# Patient Record
Sex: Female | Born: 1935 | Race: White | Hispanic: No | Marital: Married | Smoking: Never smoker
Health system: Southern US, Community
[De-identification: ages and names within clinical notes are randomized; demographics above are authoritative.]

## PROBLEM LIST (undated history)

## (undated) DIAGNOSIS — Z9289 Personal history of other medical treatment: Secondary | ICD-10-CM

## (undated) DIAGNOSIS — I5032 Chronic diastolic (congestive) heart failure: Secondary | ICD-10-CM

## (undated) DIAGNOSIS — S72002A Fracture of unspecified part of neck of left femur, initial encounter for closed fracture: Secondary | ICD-10-CM

## (undated) DIAGNOSIS — M069 Rheumatoid arthritis, unspecified: Secondary | ICD-10-CM

## (undated) DIAGNOSIS — R0600 Dyspnea, unspecified: Secondary | ICD-10-CM

## (undated) DIAGNOSIS — R0609 Other forms of dyspnea: Secondary | ICD-10-CM

## (undated) DIAGNOSIS — I480 Paroxysmal atrial fibrillation: Secondary | ICD-10-CM

## (undated) DIAGNOSIS — I82409 Acute embolism and thrombosis of unspecified deep veins of unspecified lower extremity: Secondary | ICD-10-CM

## (undated) DIAGNOSIS — E785 Hyperlipidemia, unspecified: Secondary | ICD-10-CM

## (undated) DIAGNOSIS — M1990 Unspecified osteoarthritis, unspecified site: Secondary | ICD-10-CM

## (undated) DIAGNOSIS — I10 Essential (primary) hypertension: Secondary | ICD-10-CM

## (undated) HISTORY — PX: RECTOCELE REPAIR: SHX761

## (undated) HISTORY — DX: Hyperlipidemia, unspecified: E78.5

## (undated) HISTORY — DX: Personal history of other medical treatment: Z92.89

## (undated) HISTORY — DX: Essential (primary) hypertension: I10

## (undated) HISTORY — DX: Other forms of dyspnea: R06.09

## (undated) HISTORY — PX: BLADDER SUSPENSION: SHX72

## (undated) HISTORY — DX: Paroxysmal atrial fibrillation: I48.0

## (undated) HISTORY — DX: Dyspnea, unspecified: R06.00

## (undated) HISTORY — PX: TONSILLECTOMY: SUR1361

---

## 1968-07-08 DIAGNOSIS — M069 Rheumatoid arthritis, unspecified: Secondary | ICD-10-CM

## 1968-07-08 HISTORY — DX: Rheumatoid arthritis, unspecified: M06.9

## 1982-07-08 HISTORY — PX: BREAST BIOPSY: SHX20

## 1994-07-08 HISTORY — PX: ABDOMINAL HYSTERECTOMY: SHX81

## 2001-07-08 HISTORY — PX: CATARACT EXTRACTION W/ INTRAOCULAR LENS  IMPLANT, BILATERAL: SHX1307

## 2001-11-25 ENCOUNTER — Encounter: Admission: RE | Admit: 2001-11-25 | Discharge: 2001-11-25 | Payer: Self-pay | Admitting: Family Medicine

## 2001-12-02 ENCOUNTER — Encounter: Payer: Self-pay | Admitting: Family Medicine

## 2001-12-02 ENCOUNTER — Encounter (INDEPENDENT_AMBULATORY_CARE_PROVIDER_SITE_OTHER): Payer: Self-pay | Admitting: *Deleted

## 2001-12-02 ENCOUNTER — Encounter: Admission: RE | Admit: 2001-12-02 | Discharge: 2001-12-02 | Payer: Self-pay | Admitting: *Deleted

## 2002-05-25 ENCOUNTER — Encounter: Admission: RE | Admit: 2002-05-25 | Discharge: 2002-05-25 | Payer: Self-pay | Admitting: Family Medicine

## 2002-05-25 ENCOUNTER — Encounter: Payer: Self-pay | Admitting: Family Medicine

## 2009-10-06 ENCOUNTER — Inpatient Hospital Stay (HOSPITAL_COMMUNITY): Admission: EM | Admit: 2009-10-06 | Discharge: 2009-10-13 | Payer: Self-pay | Admitting: Emergency Medicine

## 2009-10-06 DIAGNOSIS — Z9289 Personal history of other medical treatment: Secondary | ICD-10-CM

## 2009-10-06 HISTORY — DX: Personal history of other medical treatment: Z92.89

## 2010-09-26 LAB — BASIC METABOLIC PANEL WITH GFR
BUN: 16 mg/dL (ref 6–23)
BUN: 16 mg/dL (ref 6–23)
BUN: 18 mg/dL (ref 6–23)
BUN: 19 mg/dL (ref 6–23)
BUN: 22 mg/dL (ref 6–23)
CO2: 27 meq/L (ref 19–32)
CO2: 30 meq/L (ref 19–32)
CO2: 30 meq/L (ref 19–32)
CO2: 30 meq/L (ref 19–32)
CO2: 32 meq/L (ref 19–32)
Calcium: 8.5 mg/dL (ref 8.4–10.5)
Calcium: 8.5 mg/dL (ref 8.4–10.5)
Calcium: 8.6 mg/dL (ref 8.4–10.5)
Calcium: 9 mg/dL (ref 8.4–10.5)
Calcium: 9.3 mg/dL (ref 8.4–10.5)
Chloride: 106 meq/L (ref 96–112)
Chloride: 94 meq/L — ABNORMAL LOW (ref 96–112)
Chloride: 96 meq/L (ref 96–112)
Chloride: 99 meq/L (ref 96–112)
Chloride: 99 meq/L (ref 96–112)
Creatinine, Ser: 0.92 mg/dL (ref 0.4–1.2)
Creatinine, Ser: 0.95 mg/dL (ref 0.4–1.2)
Creatinine, Ser: 0.97 mg/dL (ref 0.4–1.2)
Creatinine, Ser: 1.07 mg/dL (ref 0.4–1.2)
Creatinine, Ser: 1.21 mg/dL — ABNORMAL HIGH (ref 0.4–1.2)
GFR calc non Af Amer: 42 mL/min — ABNORMAL LOW
GFR calc non Af Amer: 49 mL/min — ABNORMAL LOW
GFR calc non Af Amer: 55 mL/min — ABNORMAL LOW
GFR calc non Af Amer: 56 mL/min — ABNORMAL LOW
GFR calc non Af Amer: 58 mL/min — ABNORMAL LOW
Glucose, Bld: 102 mg/dL — ABNORMAL HIGH (ref 70–99)
Glucose, Bld: 106 mg/dL — ABNORMAL HIGH (ref 70–99)
Glucose, Bld: 111 mg/dL — ABNORMAL HIGH (ref 70–99)
Glucose, Bld: 112 mg/dL — ABNORMAL HIGH (ref 70–99)
Glucose, Bld: 128 mg/dL — ABNORMAL HIGH (ref 70–99)
Potassium: 3.3 meq/L — ABNORMAL LOW (ref 3.5–5.1)
Potassium: 3.4 meq/L — ABNORMAL LOW (ref 3.5–5.1)
Potassium: 3.9 meq/L (ref 3.5–5.1)
Potassium: 4 meq/L (ref 3.5–5.1)
Potassium: 5.2 meq/L — ABNORMAL HIGH (ref 3.5–5.1)
Sodium: 134 meq/L — ABNORMAL LOW (ref 135–145)
Sodium: 134 meq/L — ABNORMAL LOW (ref 135–145)
Sodium: 135 meq/L (ref 135–145)
Sodium: 137 meq/L (ref 135–145)
Sodium: 139 meq/L (ref 135–145)

## 2010-09-26 LAB — COMPREHENSIVE METABOLIC PANEL WITH GFR
ALT: 29 U/L (ref 0–35)
AST: 21 U/L (ref 0–37)
Albumin: 3.2 g/dL — ABNORMAL LOW (ref 3.5–5.2)
Alkaline Phosphatase: 73 U/L (ref 39–117)
BUN: 21 mg/dL (ref 6–23)
CO2: 30 meq/L (ref 19–32)
Calcium: 8.6 mg/dL (ref 8.4–10.5)
Chloride: 94 meq/L — ABNORMAL LOW (ref 96–112)
Creatinine, Ser: 1.04 mg/dL (ref 0.4–1.2)
GFR calc non Af Amer: 50 mL/min — ABNORMAL LOW
Glucose, Bld: 108 mg/dL — ABNORMAL HIGH (ref 70–99)
Potassium: 3.9 meq/L (ref 3.5–5.1)
Sodium: 133 meq/L — ABNORMAL LOW (ref 135–145)
Total Bilirubin: 0.6 mg/dL (ref 0.3–1.2)
Total Protein: 6.9 g/dL (ref 6.0–8.3)

## 2010-09-26 LAB — BRAIN NATRIURETIC PEPTIDE
Pro B Natriuretic peptide (BNP): 310 pg/mL — ABNORMAL HIGH (ref 0.0–100.0)
Pro B Natriuretic peptide (BNP): 493 pg/mL — ABNORMAL HIGH (ref 0.0–100.0)
Pro B Natriuretic peptide (BNP): 816 pg/mL — ABNORMAL HIGH (ref 0.0–100.0)

## 2010-09-26 LAB — CBC
HCT: 33 % — ABNORMAL LOW (ref 36.0–46.0)
HCT: 35.2 % — ABNORMAL LOW (ref 36.0–46.0)
HCT: 36.2 % (ref 36.0–46.0)
HCT: 37.6 % (ref 36.0–46.0)
HCT: 38.3 % (ref 36.0–46.0)
HCT: 38.5 % (ref 36.0–46.0)
HCT: 38.6 % (ref 36.0–46.0)
Hemoglobin: 11.2 g/dL — ABNORMAL LOW (ref 12.0–15.0)
Hemoglobin: 11.9 g/dL — ABNORMAL LOW (ref 12.0–15.0)
Hemoglobin: 12 g/dL (ref 12.0–15.0)
Hemoglobin: 12.6 g/dL (ref 12.0–15.0)
Hemoglobin: 12.7 g/dL (ref 12.0–15.0)
Hemoglobin: 12.9 g/dL (ref 12.0–15.0)
Hemoglobin: 13.2 g/dL (ref 12.0–15.0)
MCHC: 32.9 g/dL (ref 30.0–36.0)
MCHC: 33.2 g/dL (ref 30.0–36.0)
MCHC: 33.3 g/dL (ref 30.0–36.0)
MCHC: 33.5 g/dL (ref 30.0–36.0)
MCHC: 33.8 g/dL (ref 30.0–36.0)
MCHC: 33.8 g/dL (ref 30.0–36.0)
MCHC: 33.9 g/dL (ref 30.0–36.0)
MCHC: 34.2 g/dL (ref 30.0–36.0)
MCV: 94.5 fL (ref 78.0–100.0)
MCV: 94.9 fL (ref 78.0–100.0)
MCV: 95 fL (ref 78.0–100.0)
MCV: 95.1 fL (ref 78.0–100.0)
MCV: 95.7 fL (ref 78.0–100.0)
MCV: 95.9 fL (ref 78.0–100.0)
MCV: 95.9 fL (ref 78.0–100.0)
MCV: 96 fL (ref 78.0–100.0)
Platelets: 189 K/uL (ref 150–400)
Platelets: 218 K/uL (ref 150–400)
Platelets: 221 K/uL (ref 150–400)
Platelets: 227 K/uL (ref 150–400)
Platelets: 234 K/uL (ref 150–400)
Platelets: 246 10*3/uL (ref 150–400)
Platelets: 250 K/uL (ref 150–400)
Platelets: 251 K/uL (ref 150–400)
RBC: 3.44 MIL/uL — ABNORMAL LOW (ref 3.87–5.11)
RBC: 3.71 MIL/uL — ABNORMAL LOW (ref 3.87–5.11)
RBC: 3.81 MIL/uL — ABNORMAL LOW (ref 3.87–5.11)
RBC: 3.82 MIL/uL — ABNORMAL LOW (ref 3.87–5.11)
RBC: 3.98 MIL/uL (ref 3.87–5.11)
RBC: 3.99 MIL/uL (ref 3.87–5.11)
RBC: 4.01 MIL/uL (ref 3.87–5.11)
RBC: 4.03 MIL/uL (ref 3.87–5.11)
RDW: 14.4 % (ref 11.5–15.5)
RDW: 14.6 % (ref 11.5–15.5)
RDW: 14.8 % (ref 11.5–15.5)
RDW: 14.8 % (ref 11.5–15.5)
RDW: 14.9 % (ref 11.5–15.5)
RDW: 15.1 % (ref 11.5–15.5)
RDW: 15.3 % (ref 11.5–15.5)
WBC: 10.8 K/uL — ABNORMAL HIGH (ref 4.0–10.5)
WBC: 11.3 K/uL — ABNORMAL HIGH (ref 4.0–10.5)
WBC: 14.2 K/uL — ABNORMAL HIGH (ref 4.0–10.5)
WBC: 14.5 K/uL — ABNORMAL HIGH (ref 4.0–10.5)
WBC: 7.1 K/uL (ref 4.0–10.5)
WBC: 7.3 K/uL (ref 4.0–10.5)
WBC: 7.7 K/uL (ref 4.0–10.5)
WBC: 8.2 10*3/uL (ref 4.0–10.5)

## 2010-09-26 LAB — B. BURGDORFI ANTIBODIES: B burgdorferi Ab IgG+IgM: 0.12 {ISR}

## 2010-09-26 LAB — CARDIAC PANEL(CRET KIN+CKTOT+MB+TROPI)
CK, MB: 10 ng/mL (ref 0.3–4.0)
Total CK: 225 U/L — ABNORMAL HIGH (ref 7–177)
Total CK: 241 U/L — ABNORMAL HIGH (ref 7–177)

## 2010-09-26 LAB — PROTIME-INR
INR: 1.06 (ref 0.00–1.49)
INR: 1.39 (ref 0.00–1.49)
INR: 1.69 — ABNORMAL HIGH (ref 0.00–1.49)
INR: 2.02 — ABNORMAL HIGH (ref 0.00–1.49)
INR: 2.16 — ABNORMAL HIGH (ref 0.00–1.49)
Prothrombin Time: 13.7 s (ref 11.6–15.2)
Prothrombin Time: 16.9 s — ABNORMAL HIGH (ref 11.6–15.2)
Prothrombin Time: 19.7 s — ABNORMAL HIGH (ref 11.6–15.2)
Prothrombin Time: 22.7 s — ABNORMAL HIGH (ref 11.6–15.2)
Prothrombin Time: 23.9 seconds — ABNORMAL HIGH (ref 11.6–15.2)

## 2010-09-26 LAB — MAGNESIUM
Magnesium: 1.9 mg/dL (ref 1.5–2.5)
Magnesium: 2.5 mg/dL (ref 1.5–2.5)

## 2010-09-26 LAB — DIFFERENTIAL
Basophils Absolute: 0 K/uL (ref 0.0–0.1)
Basophils Absolute: 0 K/uL (ref 0.0–0.1)
Basophils Relative: 0 % (ref 0–1)
Basophils Relative: 0 % (ref 0–1)
Eosinophils Absolute: 0 K/uL (ref 0.0–0.7)
Eosinophils Absolute: 0.1 K/uL (ref 0.0–0.7)
Eosinophils Relative: 1 % (ref 0–5)
Eosinophils Relative: 1 % (ref 0–5)
Lymphocytes Relative: 14 % (ref 12–46)
Lymphocytes Relative: 8 % — ABNORMAL LOW (ref 12–46)
Lymphs Abs: 1 K/uL (ref 0.7–4.0)
Lymphs Abs: 1.1 K/uL (ref 0.7–4.0)
Monocytes Absolute: 0.6 K/uL (ref 0.1–1.0)
Monocytes Absolute: 1.4 K/uL — ABNORMAL HIGH (ref 0.1–1.0)
Monocytes Relative: 10 % (ref 3–12)
Monocytes Relative: 9 % (ref 3–12)
Neutro Abs: 11.6 K/uL — ABNORMAL HIGH (ref 1.7–7.7)
Neutro Abs: 5.4 K/uL (ref 1.7–7.7)
Neutrophils Relative %: 77 % (ref 43–77)
Neutrophils Relative %: 82 % — ABNORMAL HIGH (ref 43–77)

## 2010-09-26 LAB — URINALYSIS, ROUTINE W REFLEX MICROSCOPIC
Bilirubin Urine: NEGATIVE
Glucose, UA: NEGATIVE mg/dL
Ketones, ur: NEGATIVE mg/dL
Nitrite: NEGATIVE
Protein, ur: 30 mg/dL — AB
Specific Gravity, Urine: 1.043 — ABNORMAL HIGH (ref 1.005–1.030)
Urobilinogen, UA: 1 mg/dL (ref 0.0–1.0)
pH: 6 (ref 5.0–8.0)

## 2010-09-26 LAB — CK TOTAL AND CKMB (NOT AT ARMC)
CK, MB: 12.8 ng/mL (ref 0.3–4.0)
Relative Index: 4.5 — ABNORMAL HIGH (ref 0.0–2.5)
Total CK: 287 U/L — ABNORMAL HIGH (ref 7–177)

## 2010-09-26 LAB — URINE CULTURE
Colony Count: >=100000
Special Requests: NEGATIVE

## 2010-09-26 LAB — URINE MICROSCOPIC-ADD ON

## 2010-09-26 LAB — BASIC METABOLIC PANEL
BUN: 15 mg/dL (ref 6–23)
Calcium: 8.3 mg/dL — ABNORMAL LOW (ref 8.4–10.5)
Creatinine, Ser: 1.12 mg/dL (ref 0.4–1.2)
GFR calc non Af Amer: 46 mL/min — ABNORMAL LOW (ref >60)
Glucose, Bld: 109 mg/dL — ABNORMAL HIGH (ref 70–99)

## 2010-09-26 LAB — ROCKY MTN SPOTTED FVR AB, IGG-BLOOD: RMSF IgG: 0.33 IV

## 2010-09-26 LAB — COMPREHENSIVE METABOLIC PANEL
BUN: 15 mg/dL (ref 6–23)
CO2: 26 mEq/L (ref 19–32)
Calcium: 9.3 mg/dL (ref 8.4–10.5)
Creatinine, Ser: 0.97 mg/dL (ref 0.4–1.2)
GFR calc non Af Amer: 55 mL/min — ABNORMAL LOW (ref >60)
Glucose, Bld: 143 mg/dL — ABNORMAL HIGH (ref 70–99)
Total Protein: 7.3 g/dL (ref 6.0–8.3)

## 2010-09-26 LAB — TROPONIN I: Troponin I: 0.02 ng/mL (ref 0.00–0.06)

## 2012-07-10 ENCOUNTER — Other Ambulatory Visit (HOSPITAL_COMMUNITY): Payer: Self-pay | Admitting: Internal Medicine

## 2012-07-10 DIAGNOSIS — R0602 Shortness of breath: Secondary | ICD-10-CM

## 2012-07-22 ENCOUNTER — Ambulatory Visit (HOSPITAL_COMMUNITY)
Admission: RE | Admit: 2012-07-22 | Discharge: 2012-07-22 | Disposition: A | Discharge status: Home / Self Care | Payer: Medicare Other | Source: Ambulatory Visit | Attending: Internal Medicine | Admitting: Internal Medicine

## 2012-07-22 DIAGNOSIS — R0989 Other specified symptoms and signs involving the circulatory and respiratory systems: Secondary | ICD-10-CM | POA: Insufficient documentation

## 2012-07-22 DIAGNOSIS — I10 Essential (primary) hypertension: Secondary | ICD-10-CM | POA: Insufficient documentation

## 2012-07-22 DIAGNOSIS — I369 Nonrheumatic tricuspid valve disorder, unspecified: Secondary | ICD-10-CM | POA: Insufficient documentation

## 2012-07-22 DIAGNOSIS — I080 Rheumatic disorders of both mitral and aortic valves: Secondary | ICD-10-CM | POA: Insufficient documentation

## 2012-07-22 DIAGNOSIS — R0609 Other forms of dyspnea: Secondary | ICD-10-CM | POA: Insufficient documentation

## 2012-07-22 DIAGNOSIS — I379 Nonrheumatic pulmonary valve disorder, unspecified: Secondary | ICD-10-CM | POA: Insufficient documentation

## 2012-07-22 DIAGNOSIS — R0602 Shortness of breath: Secondary | ICD-10-CM

## 2012-07-22 DIAGNOSIS — I4891 Unspecified atrial fibrillation: Secondary | ICD-10-CM | POA: Insufficient documentation

## 2012-07-22 HISTORY — PX: TRANSTHORACIC ECHOCARDIOGRAM: SHX275

## 2012-07-22 NOTE — Progress Notes (Signed)
2D Echo Performed 07/22/2012    Lief Palmatier, RCS  

## 2013-02-09 ENCOUNTER — Encounter: Payer: Self-pay | Admitting: *Deleted

## 2013-02-12 ENCOUNTER — Ambulatory Visit (INDEPENDENT_AMBULATORY_CARE_PROVIDER_SITE_OTHER): Payer: Medicare Other | Admitting: Internal Medicine

## 2013-02-12 ENCOUNTER — Encounter: Payer: Self-pay | Admitting: Internal Medicine

## 2013-02-12 VITALS — BP 120/72 | HR 53 | Ht 65.0 in | Wt 166.8 lb

## 2013-02-12 DIAGNOSIS — E785 Hyperlipidemia, unspecified: Secondary | ICD-10-CM

## 2013-02-12 DIAGNOSIS — I4819 Other persistent atrial fibrillation: Secondary | ICD-10-CM | POA: Insufficient documentation

## 2013-02-12 DIAGNOSIS — I4891 Unspecified atrial fibrillation: Secondary | ICD-10-CM

## 2013-02-12 DIAGNOSIS — R0602 Shortness of breath: Secondary | ICD-10-CM | POA: Insufficient documentation

## 2013-02-12 DIAGNOSIS — R0989 Other specified symptoms and signs involving the circulatory and respiratory systems: Secondary | ICD-10-CM

## 2013-02-12 DIAGNOSIS — I480 Paroxysmal atrial fibrillation: Secondary | ICD-10-CM

## 2013-02-12 DIAGNOSIS — I519 Heart disease, unspecified: Secondary | ICD-10-CM

## 2013-02-12 DIAGNOSIS — I10 Essential (primary) hypertension: Secondary | ICD-10-CM | POA: Insufficient documentation

## 2013-02-12 DIAGNOSIS — I5189 Other ill-defined heart diseases: Secondary | ICD-10-CM

## 2013-02-12 NOTE — Progress Notes (Signed)
OFFICE NOTE  Chief Complaint:  Routine followup  Primary Care Physician: Carmin Richmond, MD  HPI:  Kendra Hughes is an 79 year old female with a history of PAF which is infrequent. She has been on aspirin per her choice, although she met criteria of course for Coumadin therapy. She is sinus rhythm today. She has been having some shortness of breath with exertion. This is improved somewhat with Lasix, and then she was put on Aldactone. As you saw, the potassium unfortunately got very high and this is now resolved by your blood work on July 28, 2012. She does seem to do well with Lasix and has never had any lower extremity edema, most of it is across her abdomen which sounds like she may have some perhaps steatohepatitis or non-alcoholic fatty liver. There is an elevated LDH with normal liver enzymes and normal GGT, however. There is no evidence of heart failure. In fact we did an echocardiogram which we followed up with her today which shows an EF of 55-60% with apparently normal diastolic function. Her wall thickness is normal. There is mild AI and mild MR, however, and mild to moderate TR, but her left atrial size is normal, and again diastolic function appears normal - but that is hard to believe given her age. Her only other concern today is that she has some episodes of low blood pressure. At times her blood pressures dropped into the 90s systolic with an associated increase in her pulse. She thinks she is on possibly too much blood pressure medication.  PMHx:  Past Medical History  Diagnosis Date  . PAF (paroxysmal atrial fibrillation)   . Hypertension   . Dyslipidemia   . DOE (dyspnea on exertion)   . History of nuclear stress test 10/2009    normal exam    Past Surgical History  Procedure Laterality Date  . Abdominal hysterectomy    . Biopsy breast      right  . Transthoracic echocardiogram  07/22/2012    EF 55-60%; mild AV regurg; mild MR; mild-mod TR    FAMHx:  No family  history on file.  SOCHx:   reports that she has never smoked. She has never used smokeless tobacco. She reports that she does not drink alcohol or use illicit drugs.  ALLERGIES:  No Known Allergies  ROS: A comprehensive review of systems was negative except for: Cardiovascular: positive for dyspnea Neurological: positive for dizziness  HOME MEDS: Current Outpatient Prescriptions  Medication Sig Dispense Refill  . aspirin 325 MG tablet Take 325 mg by mouth daily.      [name] atorvastatin (LIPITOR) 10 MG tablet Take 10 mg by mouth daily.      . carvedilol (COREG) 3.125 MG tablet Take 3.125 mg by mouth 2 (two) times daily with a meal.      . Cholecalciferol (VITAMIN D PO) Take 1,000 mg by mouth daily.       [name] Fexofenadine HCl (ALLEGRA PO) Take by mouth as needed.      . Fluticasone Propionate (FLONASE NA) Place into the nose as needed.      . folic acid (FOLVITE) 800 MCG tablet Take 800 mcg by mouth daily.      . furosemide (LASIX) 40 MG tablet Take 40 mg by mouth daily.       No current facility-administered medications for this visit.    LABS/IMAGING: No results found for this or any previous visit (from the past 48 hour(s)). No results found.  VITALS: BP 120/72  Pulse 53  Ht 5\' 5"  (1.651 m)  Wt 166 lb 12.8 oz (75.66 kg)  BMI 27.76 kg/m2  EXAM: General appearance: alert and no distress Neck: no adenopathy, no carotid bruit, no JVD, supple, symmetrical, trachea midline and thyroid not enlarged, symmetric, no tenderness/mass/nodules Lungs: clear to auscultation bilaterally Heart: regular rate and rhythm, S1, S2 normal, no murmur, click, rub or gallop Abdomen: soft, non-tender; bowel sounds normal; no masses,  no organomegaly Extremities: extremities normal, atraumatic, no cyanosis or edema Pulses: 2+ and symmetric Skin: Skin color, texture, turgor normal. No rashes or lesions Neurologic: Grossly normal  EKG: Sinus bradycardia at 53  ASSESSMENT: 1. Paroxysmal atrial  fibrillation, remote on aspirin (she has declined warfarin) 2. Hypertension 3. Dyslipidemia 4. Mild dyspnea on exertion which is stable  PLAN: 1.   [name] continues to do very well. She is active gardening and continues to tan vegetables. She does have some shortness of breath with marked exertion but is generally able to do most activities without limitation. She has had a couple of episodes of hypotension in the morning and it may be related to her blood pressure medications. I think her blood pressures been very well controlled and the additional low-dose [place] that she takes is probably not necessary. We're going to discontinue that today. She should continue her other medications. We'll plan to see her back annually or sooner as necessary.  Chrystie Nose, MD, [facility] Attending Cardiologist [facility]  Shawndell Varas C 02/12/2013, 11:29 AM

## 2013-02-12 NOTE — Patient Instructions (Signed)
Your physician recommends that you schedule a follow-up appointment in: 1 year  Stop taking ramipril.

## 2013-03-11 ENCOUNTER — Telehealth: Payer: Self-pay | Admitting: Internal Medicine

## 2013-03-11 MED ORDER — CARVEDILOL 3.125 MG PO TABS: 3.1250 mg | ORAL_TABLET | Freq: Two times a day (BID) | ORAL | Status: DC

## 2013-03-11 NOTE — Telephone Encounter (Signed)
Returned call.  Pt stated she called [facility] for refills on Coreg.  Stated they tried to contact her doctor, but it was her old doctor.  Needs Rx sent to [facility].  Confirmed dose and Refill(s) sent to pharmacy.  Pt verbalized understanding and agreed w/ plan.

## 2013-03-11 NOTE — Telephone Encounter (Signed)
Pt was calling in regards her Rx Coreg. She stated that it was running out but needed to talk to someone about it.

## 2013-08-19 ENCOUNTER — Ambulatory Visit (INDEPENDENT_AMBULATORY_CARE_PROVIDER_SITE_OTHER): Payer: Medicare Other | Admitting: Internal Medicine

## 2013-08-19 ENCOUNTER — Encounter (HOSPITAL_COMMUNITY): Payer: Self-pay | Admitting: Emergency Medicine

## 2013-08-19 ENCOUNTER — Emergency Department (HOSPITAL_COMMUNITY): Payer: Medicare Other

## 2013-08-19 ENCOUNTER — Encounter: Payer: Self-pay | Admitting: Internal Medicine

## 2013-08-19 ENCOUNTER — Encounter (HOSPITAL_COMMUNITY): Payer: Self-pay | Admitting: Cardiology

## 2013-08-19 ENCOUNTER — Inpatient Hospital Stay (HOSPITAL_COMMUNITY)
Admission: EM | Admit: 2013-08-19 | Discharge: 2013-08-27 | DRG: 291 | Disposition: A | Discharge status: Home / Self Care | Payer: Medicare Other | Attending: Internal Medicine | Admitting: Internal Medicine

## 2013-08-19 VITALS — BP 102/62 | HR 173 | Ht 64.0 in | Wt 175.0 lb

## 2013-08-19 DIAGNOSIS — E785 Hyperlipidemia, unspecified: Secondary | ICD-10-CM | POA: Diagnosis present

## 2013-08-19 DIAGNOSIS — E876 Hypokalemia: Secondary | ICD-10-CM | POA: Diagnosis present

## 2013-08-19 DIAGNOSIS — I079 Rheumatic tricuspid valve disease, unspecified: Secondary | ICD-10-CM | POA: Diagnosis present

## 2013-08-19 DIAGNOSIS — I059 Rheumatic mitral valve disease, unspecified: Secondary | ICD-10-CM | POA: Diagnosis present

## 2013-08-19 DIAGNOSIS — I509 Heart failure, unspecified: Secondary | ICD-10-CM

## 2013-08-19 DIAGNOSIS — I519 Heart disease, unspecified: Secondary | ICD-10-CM

## 2013-08-19 DIAGNOSIS — I959 Hypotension, unspecified: Secondary | ICD-10-CM | POA: Diagnosis present

## 2013-08-19 DIAGNOSIS — Z7901 Long term (current) use of anticoagulants: Secondary | ICD-10-CM

## 2013-08-19 DIAGNOSIS — I10 Essential (primary) hypertension: Secondary | ICD-10-CM | POA: Diagnosis present

## 2013-08-19 DIAGNOSIS — R0609 Other forms of dyspnea: Secondary | ICD-10-CM

## 2013-08-19 DIAGNOSIS — R570 Cardiogenic shock: Secondary | ICD-10-CM

## 2013-08-19 DIAGNOSIS — I4891 Unspecified atrial fibrillation: Secondary | ICD-10-CM

## 2013-08-19 DIAGNOSIS — I4819 Other persistent atrial fibrillation: Secondary | ICD-10-CM | POA: Diagnosis present

## 2013-08-19 DIAGNOSIS — R0989 Other specified symptoms and signs involving the circulatory and respiratory systems: Secondary | ICD-10-CM

## 2013-08-19 DIAGNOSIS — I513 Intracardiac thrombosis, not elsewhere classified: Secondary | ICD-10-CM

## 2013-08-19 DIAGNOSIS — Z7982 Long term (current) use of aspirin: Secondary | ICD-10-CM

## 2013-08-19 DIAGNOSIS — R791 Abnormal coagulation profile: Secondary | ICD-10-CM | POA: Diagnosis present

## 2013-08-19 DIAGNOSIS — I5189 Other ill-defined heart diseases: Secondary | ICD-10-CM

## 2013-08-19 DIAGNOSIS — I38 Endocarditis, valve unspecified: Secondary | ICD-10-CM | POA: Diagnosis present

## 2013-08-19 DIAGNOSIS — I5031 Acute diastolic (congestive) heart failure: Principal | ICD-10-CM | POA: Diagnosis present

## 2013-08-19 LAB — TROPONIN I

## 2013-08-19 LAB — CBC WITH DIFFERENTIAL/PLATELET
BASOS ABS: 0 10*3/uL (ref 0.0–0.1)
Basophils Relative: 0 % (ref 0–1)
EOS PCT: 1 % (ref 0–5)
Eosinophils Absolute: 0.1 10*3/uL (ref 0.0–0.7)
HEMATOCRIT: 38.5 % (ref 36.0–46.0)
HEMOGLOBIN: 13.3 g/dL (ref 12.0–15.0)
Lymphocytes Relative: 18 % (ref 12–46)
Lymphs Abs: 1.1 10*3/uL (ref 0.7–4.0)
MCH: 30.7 pg (ref 26.0–34.0)
MCHC: 34.5 g/dL (ref 30.0–36.0)
MCV: 88.9 fL (ref 78.0–100.0)
MONO ABS: 0.7 10*3/uL (ref 0.1–1.0)
MONOS PCT: 11 % (ref 3–12)
Neutro Abs: 4.1 10*3/uL (ref 1.7–7.7)
Neutrophils Relative %: 70 % (ref 43–77)
Platelets: 273 10*3/uL (ref 150–400)
RBC: 4.33 MIL/uL (ref 3.87–5.11)
RDW: 14.4 % (ref 11.5–15.5)
WBC: 5.9 10*3/uL (ref 4.0–10.5)

## 2013-08-19 LAB — PRO B NATRIURETIC PEPTIDE
PRO B NATRI PEPTIDE: 2480 pg/mL — AB (ref 0–450)
Pro B Natriuretic peptide (BNP): 2773 pg/mL — ABNORMAL HIGH (ref 0–450)

## 2013-08-19 LAB — BASIC METABOLIC PANEL
BUN: 18 mg/dL (ref 6–23)
CALCIUM: 9.4 mg/dL (ref 8.4–10.5)
CO2: 27 meq/L (ref 19–32)
CREATININE: 0.85 mg/dL (ref 0.50–1.10)
Chloride: 92 mEq/L — ABNORMAL LOW (ref 96–112)
GFR calc Af Amer: 69 mL/min — ABNORMAL LOW (ref >90)
GFR calc non Af Amer: 59 mL/min — ABNORMAL LOW (ref >90)
GLUCOSE: 108 mg/dL — AB (ref 70–99)
Potassium: 4 mEq/L (ref 3.7–5.3)
Sodium: 136 mEq/L — ABNORMAL LOW (ref 137–147)

## 2013-08-19 LAB — MAGNESIUM: MAGNESIUM: 2.3 mg/dL (ref 1.5–2.5)

## 2013-08-19 MED ORDER — DILTIAZEM HCL 100 MG IV SOLR
5.0000 mg/h | INTRAVENOUS | Status: DC
  Administered 2013-08-19: 10 mg/h via INTRAVENOUS
  Administered 2013-08-21: 5 mg/h via INTRAVENOUS

## 2013-08-19 MED ORDER — HEPARIN SODIUM (PORCINE) 5000 UNIT/ML IJ SOLN: 60.0000 [IU]/kg | Freq: Once | INTRAMUSCULAR | Status: DC

## 2013-08-19 MED ORDER — ASPIRIN 81 MG PO CHEW: 324.0000 mg | CHEWABLE_TABLET | Freq: Once | ORAL | Status: DC

## 2013-08-19 MED ORDER — ALPRAZOLAM 0.25 MG PO TABS
0.2500 mg | ORAL_TABLET | Freq: Two times a day (BID) | ORAL | Status: DC | PRN
  Administered 2013-08-20 – 2013-08-21 (×2): 0.25 mg via ORAL
  Filled 2013-08-19 (×2): qty 1

## 2013-08-19 MED ORDER — DILTIAZEM HCL 100 MG IV SOLR
5.0000 mg/h | INTRAVENOUS | Status: DC
  Administered 2013-08-19: 5 mg/h via INTRAVENOUS

## 2013-08-19 MED ORDER — SODIUM CHLORIDE 0.9 % IV SOLN: INTRAVENOUS | Status: DC

## 2013-08-19 MED ORDER — DILTIAZEM LOAD VIA INFUSION
10.0000 mg | Freq: Once | INTRAVENOUS | Status: AC
  Administered 2013-08-19: 10 mg via INTRAVENOUS
  Filled 2013-08-19: qty 10

## 2013-08-19 MED ORDER — HEPARIN (PORCINE) IN NACL 100-0.45 UNIT/ML-% IJ SOLN: 12.0000 [IU]/kg/h | Freq: Once | INTRAMUSCULAR | Status: DC

## 2013-08-19 MED ORDER — HEPARIN BOLUS VIA INFUSION
3000.0000 [IU] | Freq: Once | INTRAVENOUS | Status: AC
  Administered 2013-08-19: 3000 [IU] via INTRAVENOUS
  Filled 2013-08-19: qty 3000

## 2013-08-19 MED ORDER — ASPIRIN EC 81 MG PO TBEC
81.0000 mg | DELAYED_RELEASE_TABLET | Freq: Every day | ORAL | Status: DC
  Administered 2013-08-20 – 2013-08-24 (×5): 81 mg via ORAL
  Filled 2013-08-19 (×6): qty 1

## 2013-08-19 MED ORDER — HEPARIN (PORCINE) IN NACL 100-0.45 UNIT/ML-% IJ SOLN
800.0000 [IU]/h | INTRAMUSCULAR | Status: DC
  Administered 2013-08-19 – 2013-08-20 (×2): 900 [IU]/h via INTRAVENOUS
  Administered 2013-08-21: 1000 [IU]/h via INTRAVENOUS
  Administered 2013-08-24: 750 [IU]/h via INTRAVENOUS
  Administered 2013-08-25: 800 [IU]/h via INTRAVENOUS
  Filled 2013-08-19 (×9): qty 250

## 2013-08-19 MED ORDER — ONDANSETRON HCL 4 MG/2ML IJ SOLN: 4.0000 mg | Freq: Four times a day (QID) | INTRAMUSCULAR | Status: DC | PRN

## 2013-08-19 MED ORDER — ACETAMINOPHEN 325 MG PO TABS: 650.0000 mg | ORAL_TABLET | ORAL | Status: DC | PRN

## 2013-08-19 NOTE — Progress Notes (Signed)
OFFICE NOTE  Chief Complaint:  "I can't breathe"  Primary Care Physician: Carmin Richmond, MD  HPI:  Kendra Hughes is an 80 year old female who I've been following for some time with a history of paroxysmal atrial fibrillation. Despite her CHADS2 score of 2, she has refused warfarin anticoagulation in the past, opting for aspirin which are documented extensively he is insufficient to protect her from stroke. She reports though over the past 2 weeks she's had worsening shortness of breath. Apparently she and her husband tried to call the office to schedule an appointment with me but could not get an appointment. It is unclear whether they did not want to see a mid-level provider or whether that was offered or not however she waited to see me today. In the office she was notably breathless and can hardly finish sentences. An EKG demonstrated atrial fibrillation with rapid ventricular response at a rate of 173. She did appear somewhat dizzy and her blood pressure was low around 100 systolic.  She denied any chest pain.  PMHx:  Past Medical History  Diagnosis Date  . PAF (paroxysmal atrial fibrillation)   . Hypertension   . Dyslipidemia   . DOE (dyspnea on exertion)   . History of nuclear stress test 10/2009    normal exam    Past Surgical History  Procedure Laterality Date  . Abdominal hysterectomy    . Biopsy breast      right  . Transthoracic echocardiogram  07/22/2012    EF 55-60%; mild AV regurg; mild MR; mild-mod TR    FAMHx:  No family history on file.  SOCHx:   reports that she has never smoked. She has never used smokeless tobacco. She reports that she does not drink alcohol or use illicit drugs.  ALLERGIES:  No Known Allergies  ROS: A comprehensive review of systems was negative except for: Constitutional: positive for fatigue Respiratory: positive for cough and dyspnea on exertion Cardiovascular: positive for dyspnea, irregular heart beat and lower extremity  edema  HOME MEDS: No current facility-administered medications for this visit.   Current Outpatient Prescriptions  Medication Sig Dispense Refill  . aspirin 325 MG tablet Take 325 mg by mouth daily.      [name] atorvastatin (LIPITOR) 10 MG tablet Take 10 mg by mouth daily.      . carvedilol (COREG) 3.125 MG tablet Take 1 tablet (3.125 mg total) by mouth 2 (two) times daily with a meal.  180 tablet  3  . Cholecalciferol (VITAMIN D PO) Take 1,000 mg by mouth daily.       [name] Fexofenadine HCl (ALLEGRA PO) Take by mouth as needed.      . Fluticasone Propionate (FLONASE NA) Place into the nose as needed.      . folic acid (FOLVITE) 800 MCG tablet Take 800 mcg by mouth daily.      . furosemide (LASIX) 40 MG tablet Take 40 mg by mouth daily.       Facility-Administered Medications Ordered in Other Visits  Medication Dose Route Frequency Provider Last Rate Last Dose  . diltiazem (CARDIZEM) 100 mg in dextrose 5 % 100 mL infusion  5-15 mg/hr Intravenous Continuous Jon Gills, MD 15 mL/hr at 08/19/13 1731 15 mg/hr at 08/19/13 1731    LABS/IMAGING: No results found for this or any previous visit (from the past 48 hour(s)). Dg Chest Portable 1 View  08/19/2013   CLINICAL DATA:  Atrial fibrillation, shortness of breath  EXAM: PORTABLE CHEST - 1  VIEW  COMPARISON:  NM MYOCAR MULTI w/SPECT w/WALL MOTION / EF dated 10/09/2009; DG CHEST 1V PORT dated 10/09/2009  FINDINGS: The cardiac silhouette is enlarged. Increased density projects within the right lung base within meniscal apex. There is diffuse prominence of the interstitial markings with greatest confluence in the lung bases. The osseous structures demonstrate no acute abnormalities. Atherosclerotic calcifications identified within the aorta.  IMPRESSION: Findings likely reflecting a pleural effusion, small, right lung base. Atelectasis versus infiltrate within the lung bases right greater than left. Chronic bronchitic changes are also identified.   Electronically  Signed   By: Hector  Cooper M.D.   On: 08/19/2013 16:23    VITALS: BP 102/62  Pulse 173  Ht 5' 4" (1.626 m)  Wt 175 lb (79.379 kg)  BMI 30.02 kg/m2  EXAM: General appearance: alert and ill-appearing, markedly dyspneic Neck: JVD - 5 cm above sternal notch and no carotid bruit Lungs: dullness to percussion mid-way up both lungs with overlying crackles Heart: irregularly irregulary, very rapid Abdomen: soft, non-tender Extremities: 1+ edema Pulses: difficult to palpate Skin: Skin color, texture, turgor normal. No rashes or lesions Neurologic: Mental status: Awake and oriented Psych: Appropriately anxious, breathless  EKG: Atrial fibrillation with rapid ventricular response at 173  ASSESSMENT: 1. Acute congestive heart failure 2. Atrial fibrillation with rapid ventricular response 3. Hypotension secondary to cardiogenic shock  PLAN: 1.   Kendra Hughes appears markedly ill today and is in A. fib with a very rapid heart rate. It sounds like she's been sick for several weeks and unfortunately did not present to the emergency room or push to be seen in the office. She is clearly in heart failure and has elevated neck veins, dull lung fields, lower extremity edema and has had cough for several weeks.  I am concerned that she may develop a tachycardia mediated cardiomyopathy.  I'm also worried that she is refused anticoagulation with warfarin in the past and may have been at much higher risk now for the development of thrombus and stroke.  We have contacted the ambulance to transport her to the [place] [facility].  She needs better rate control, diuresis and heparinization and admission.  I will notify the card master of her disposition.  Noelly Lasseigne C. Kadeshia Kasparian, MD, FACC Attending Cardiologist [facility]  Louiza Moor C 08/19/2013, 5:41 PM  

## 2013-08-19 NOTE — ED Notes (Signed)
Phlebotomy at bedside.

## 2013-08-19 NOTE — ED Notes (Signed)
X-ray at bedside

## 2013-08-19 NOTE — ED Notes (Signed)
Kendra Hughes, husband phone number [phone]

## 2013-08-19 NOTE — ED Notes (Signed)
Dr Pickering at bedside 

## 2013-08-19 NOTE — H&P (Signed)
Please see Dr. Blanchie Dessert office note.  He admitted from the office. This is his H&P.

## 2013-08-19 NOTE — ED Notes (Signed)
Pt reports she has had this same problem before and they had to give her medicine to slow it down.

## 2013-08-19 NOTE — ED Provider Notes (Signed)
CSN: [id number]     Arrival date & time 08/19/13  1549 History   None    Chief Complaint  Patient presents with  . Atrial Fibrillation   HPI Comments: 80 yo F hx of HTn, HLD, paroxysmal atrial fibrillation presents with CC of SOB, palptiations.  Pt states symptoms started 2 weeks prior.  She endorses increasing dyspnea at rest and with exertion, nonproductive cough, bilateral lower extremity edema, palpitations.  Denies fever, chills, CP, nausea, vomiting, abdominal pain, diarrhea, myalgias, or any other symptoms.  Pt went to PCP today and was noted to be in Afib with RVR HR 170.  Pt was sent to ED for further evaluation.  Pt does note that she has hx of atrial fibrillation, and has had to be admitted for this approximately 4 years prior.  Pt is stable on home medications, including ASA, Carvedilol, Lasix.    The history is provided by the patient and a relative. No language interpreter was used.    Past Medical History  Diagnosis Date  . PAF (paroxysmal atrial fibrillation)   . Hypertension   . Dyslipidemia   . DOE (dyspnea on exertion)   . History of nuclear stress test 10/2009    normal exam   Past Surgical History  Procedure Laterality Date  . Abdominal hysterectomy    . Biopsy breast      right  . Transthoracic echocardiogram  07/22/2012    EF 55-60%; mild AV regurg; mild MR; mild-mod TR   No family history on file. History  Substance Use Topics  . Smoking status: Never Smoker   . Smokeless tobacco: Never Used  . Alcohol Use: No   OB History   Grav Para Term Preterm Abortions TAB SAB Ect Mult Living                 Review of Systems  Constitutional: Negative for fever and chills.  Respiratory: Positive for cough and shortness of breath. Negative for apnea, chest tightness, wheezing and stridor.   Cardiovascular: Positive for palpitations and leg swelling. Negative for chest pain.  Gastrointestinal: Negative for nausea, vomiting, abdominal pain and diarrhea.   Genitourinary: Negative for dysuria.  Musculoskeletal: Negative for myalgias.  Skin: Negative for rash.  Neurological: Negative for dizziness, tremors, weakness, light-headedness and numbness.  Hematological: Negative for adenopathy. Does not bruise/bleed easily.  All other systems reviewed and are negative.      Allergies  Review of patient's allergies indicates no known allergies.  Home Medications   Current Outpatient Rx  Name  Route  Sig  Dispense  Refill  . aspirin 325 MG tablet   Oral   Take 325 mg by mouth daily.         [name] atorvastatin (LIPITOR) 10 MG tablet   Oral   Take 10 mg by mouth daily.         . carvedilol (COREG) 3.125 MG tablet   Oral   Take 1 tablet (3.125 mg total) by mouth 2 (two) times daily with a meal.   180 tablet   3   . Cholecalciferol (VITAMIN D PO)   Oral   Take 1,000 mg by mouth daily.          [name] Fexofenadine HCl (ALLEGRA PO)   Oral   Take by mouth as needed.         . Fluticasone Propionate (FLONASE NA)   Nasal   Place into the nose as needed.         [name]  folic acid (FOLVITE) 800 MCG tablet   Oral   Take 800 mcg by mouth daily.         . furosemide (LASIX) 40 MG tablet   Oral   Take 40 mg by mouth daily.          BP 152/106  Pulse 158  Temp(Src) 98 F (36.7 C) (Oral)  Resp 22  SpO2 94% Physical Exam  Nursing note and vitals reviewed. Constitutional: She is oriented to person, place, and time. She appears well-developed and well-nourished.  HENT:  Head: Normocephalic and atraumatic.  Mouth/Throat: Oropharynx is clear and moist.  Eyes: Conjunctivae and EOM are normal. Pupils are equal, round, and reactive to light.  Neck: Normal range of motion. Neck supple.  Cardiovascular: Intact distal pulses.  Exam reveals no gallop and no friction rub.   No murmur heard. afib RVR rate 170's, no murmur  Pulmonary/Chest: Effort normal. No respiratory distress. She has no wheezes. She has no rales. She exhibits no  tenderness.  Diminished breath sounds bilaterally.  Abdominal: Soft. She exhibits no distension and no mass. There is no tenderness. There is no rebound and no guarding.  Musculoskeletal: Normal range of motion.  Neurological: She is alert and oriented to person, place, and time.  Skin: Skin is warm and dry.    ED Course  Procedures (including critical care time) Labs Review Labs Reviewed  BASIC METABOLIC PANEL - Abnormal; Notable for the following:    Sodium 136 (*)    Chloride 92 (*)    Glucose, Bld 108 (*)    GFR calc non Af Amer 59 (*)    GFR calc Af Amer 69 (*)    All other components within normal limits  PRO B NATRIURETIC PEPTIDE - Abnormal; Notable for the following:    Pro B Natriuretic peptide (BNP) 2773.0 (*)    All other components within normal limits  PRO B NATRIURETIC PEPTIDE - Abnormal; Notable for the following:    Pro B Natriuretic peptide (BNP) 2480.0 (*)    All other components within normal limits  MRSA PCR SCREENING  CBC WITH DIFFERENTIAL  TROPONIN I  TROPONIN I  MAGNESIUM  HEPARIN LEVEL (UNFRACTIONATED)  CBC  TROPONIN I  TROPONIN I  TSH  BASIC METABOLIC PANEL  PROTIME-INR   Imaging Review Dg Chest Portable 1 View  08/19/2013   CLINICAL DATA:  Atrial fibrillation, shortness of breath  EXAM: PORTABLE CHEST - 1 VIEW  COMPARISON:  NM MYOCAR MULTI w/SPECT w/WALL MOTION / EF dated 10/09/2009; DG CHEST 1V PORT dated 10/09/2009  FINDINGS: The cardiac silhouette is enlarged. Increased density projects within the right lung base within meniscal apex. There is diffuse prominence of the interstitial markings with greatest confluence in the lung bases. The osseous structures demonstrate no acute abnormalities. Atherosclerotic calcifications identified within the aorta.  IMPRESSION: Findings likely reflecting a pleural effusion, small, right lung base. Atelectasis versus infiltrate within the lung bases right greater than left. Chronic bronchitic changes are also  identified.   Electronically Signed   By: Salome Holmes M.D.   On: 08/19/2013 16:23    EKG Interpretation   None       MDM   Final diagnoses:  None   80 yo F hx of HTn, HLD, paroxysmal atrial fibrillation presents with CC of SOB, palptiations.  Filed Vitals:   08/20/13 0000  BP: 131/72  Pulse: 72  Temp:   Resp: 19   Pt in Atrial fib RVR on arrival with rate  170's on monitor.  SBP 140's on monitor.  Pt denies CP, endorses SOB, palpitations.  EKG HR 173, QT/QTc 287/487, Atrial fib with RVR, RSR' v1, v2, no ST abnormalities.  Pt given 10 mg Cardizem, followed by continuous Cardizem infusion. Pt took full ASA at home, so does not require here. CXR shows enlarged cardiac silhouette, small right pleural effusion.  Labs show Troponin < 0.30, BNP 2773.  CBC WNL.  BMP shows mild hypochloremia, with BUN/Cr WNL.  On reevaluation pt's HR down to 80-90's, with normal BP.  Pt states she feels better, and is now sleeping in bed.  Cardiology consulted.  Recommended giving heparin bolus, and starting heparin infusion.  Will admit to Cardiology service for further management of atrial fib w/ RVR.  Pt understands and agrees with plan.  I have discussed this pt's care plan with Dr. Rubin Payor.  Jon Gills, MD      Jon Gills, MD 08/20/13 [phone]

## 2013-08-19 NOTE — ED Notes (Signed)
Attempted to call report to [facility], RN requests . Number provided for call back.

## 2013-08-19 NOTE — ED Notes (Signed)
Per EMS - pt c/o not feeling good for 2 weeks, went to PCP today saw pt was in a.fib with RVR. Pt has hx of A.fib, 12 lead at PCP showing HR of 170. Pt c/o sob with exertion. BP 102/60 at doctors office. BP 140/108 HR 151 96% on 3 liters, doesn't normally wear oxygen, 90% on room air. 16G in left forearm.

## 2013-08-19 NOTE — ED Notes (Signed)
Attempted to call report to [facility] RN Luther Parody. Nurse currently occupied with new admission.

## 2013-08-19 NOTE — Progress Notes (Signed)
ANTICOAGULATION CONSULT NOTE - Initial Consult  Pharmacy Consult for Heparin Indication: atrial fibrillation  No Known Allergies  Patient Measurements: Weight: 175 lb 0.7 oz (79.4 kg)  Vital Signs: Temp: 98 F (36.7 C) (02/12 1602) Temp src: Oral (02/12 1602) BP: 127/53 mmHg (02/12 1830) Pulse Rate: 84 (02/12 1830)  Labs:  Recent Labs  08/19/13 1642  HGB 13.3  HCT 38.5  PLT 273  CREATININE 0.85  TROPONINI <0.30    Medical History: Past Medical History  Diagnosis Date  . PAF (paroxysmal atrial fibrillation)   . Hypertension   . Dyslipidemia   . DOE (dyspnea on exertion)   . History of nuclear stress test 10/2009    normal exam   Assessment: 80 year old female to begin heparin for AFib and CHF exacerbation.  She has a history of PAF and has refused Coumadin in the past.  Goal of Therapy:  Heparin level 0.3-0.7 units/ml Monitor platelets by anticoagulation protocol: Yes   Plan:  1) Heparin 3000 units iv bolus x 1 2) Heparin drip at 900 units / hr 3) Daily heparin level, CBC  Thank you. Okey Regal, PharmD [phone]  08/19/2013,6:50 PM

## 2013-08-20 ENCOUNTER — Encounter (HOSPITAL_COMMUNITY): Payer: Self-pay | Admitting: Internal Medicine

## 2013-08-20 DIAGNOSIS — I4891 Unspecified atrial fibrillation: Secondary | ICD-10-CM

## 2013-08-20 LAB — URINE MICROSCOPIC-ADD ON

## 2013-08-20 LAB — TSH: TSH: 2.258 u[IU]/mL (ref 0.350–4.500)

## 2013-08-20 LAB — BASIC METABOLIC PANEL
BUN: 18 mg/dL (ref 6–23)
CALCIUM: 9.4 mg/dL (ref 8.4–10.5)
CHLORIDE: 92 meq/L — AB (ref 96–112)
CO2: 29 meq/L (ref 19–32)
Creatinine, Ser: 0.84 mg/dL (ref 0.50–1.10)
GFR calc Af Amer: 70 mL/min — ABNORMAL LOW (ref >90)
GFR calc non Af Amer: 60 mL/min — ABNORMAL LOW (ref >90)
Glucose, Bld: 110 mg/dL — ABNORMAL HIGH (ref 70–99)
Potassium: 3.8 mEq/L (ref 3.7–5.3)
Sodium: 133 mEq/L — ABNORMAL LOW (ref 137–147)

## 2013-08-20 LAB — URINALYSIS, ROUTINE W REFLEX MICROSCOPIC
Bilirubin Urine: NEGATIVE
GLUCOSE, UA: NEGATIVE mg/dL
Ketones, ur: 15 mg/dL — AB
NITRITE: NEGATIVE
Protein, ur: NEGATIVE mg/dL
SPECIFIC GRAVITY, URINE: 1.02 (ref 1.005–1.030)
Urobilinogen, UA: 0.2 mg/dL (ref 0.0–1.0)
pH: 5.5 (ref 5.0–8.0)

## 2013-08-20 LAB — CBC
HCT: 37.3 % (ref 36.0–46.0)
Hemoglobin: 12.6 g/dL (ref 12.0–15.0)
MCH: 30.1 pg (ref 26.0–34.0)
MCHC: 33.8 g/dL (ref 30.0–36.0)
MCV: 89 fL (ref 78.0–100.0)
PLATELETS: 259 10*3/uL (ref 150–400)
RBC: 4.19 MIL/uL (ref 3.87–5.11)
RDW: 14.5 % (ref 11.5–15.5)
WBC: 8.6 10*3/uL (ref 4.0–10.5)

## 2013-08-20 LAB — TROPONIN I: Troponin I: <0.3 ng/mL (ref <0.30)

## 2013-08-20 LAB — HEPATIC FUNCTION PANEL
ALBUMIN: 3.6 g/dL (ref 3.5–5.2)
ALT: 33 U/L (ref 0–35)
AST: 35 U/L (ref 0–37)
Alkaline Phosphatase: 77 U/L (ref 39–117)
Bilirubin, Direct: 0.2 mg/dL (ref 0.0–0.3)
Indirect Bilirubin: 0.7 mg/dL (ref 0.3–0.9)
TOTAL PROTEIN: 6.8 g/dL (ref 6.0–8.3)
Total Bilirubin: 0.9 mg/dL (ref 0.3–1.2)

## 2013-08-20 LAB — PROTIME-INR
INR: 1.24 (ref 0.00–1.49)
Prothrombin Time: 15.3 seconds — ABNORMAL HIGH (ref 11.6–15.2)

## 2013-08-20 LAB — HEPARIN LEVEL (UNFRACTIONATED)
HEPARIN UNFRACTIONATED: 0.56 [IU]/mL (ref 0.30–0.70)
Heparin Unfractionated: 0.4 IU/mL (ref 0.30–0.70)

## 2013-08-20 LAB — MRSA PCR SCREENING: MRSA by PCR: NEGATIVE

## 2013-08-20 MED ORDER — FOLIC ACID 1 MG PO TABS
1.0000 mg | ORAL_TABLET | Freq: Every day | ORAL | Status: DC
  Administered 2013-08-20 – 2013-08-27 (×8): 1 mg via ORAL
  Filled 2013-08-20 (×8): qty 1

## 2013-08-20 MED ORDER — POTASSIUM CHLORIDE CRYS ER 20 MEQ PO TBCR
40.0000 meq | EXTENDED_RELEASE_TABLET | Freq: Once | ORAL | Status: AC
  Administered 2013-08-20: 40 meq via ORAL
  Filled 2013-08-20: qty 2

## 2013-08-20 MED ORDER — ATORVASTATIN CALCIUM 10 MG PO TABS
10.0000 mg | ORAL_TABLET | Freq: Every day | ORAL | Status: DC
  Administered 2013-08-20 – 2013-08-26 (×7): 10 mg via ORAL
  Filled 2013-08-20 (×8): qty 1

## 2013-08-20 MED ORDER — FUROSEMIDE 40 MG PO TABS
40.0000 mg | ORAL_TABLET | Freq: Every day | ORAL | Status: DC
  Administered 2013-08-20 – 2013-08-26 (×7): 40 mg via ORAL
  Filled 2013-08-20 (×8): qty 1

## 2013-08-20 MED ORDER — CARVEDILOL 3.125 MG PO TABS
3.1250 mg | ORAL_TABLET | Freq: Two times a day (BID) | ORAL | Status: DC
  Filled 2013-08-20 (×2): qty 1

## 2013-08-20 MED ORDER — METOPROLOL TARTRATE 25 MG PO TABS
25.0000 mg | ORAL_TABLET | Freq: Three times a day (TID) | ORAL | Status: DC
  Administered 2013-08-20 – 2013-08-21 (×4): 25 mg via ORAL
  Filled 2013-08-20 (×6): qty 1

## 2013-08-20 MED ORDER — FOLIC ACID 800 MCG PO TABS: 800.0000 ug | ORAL_TABLET | Freq: Every day | ORAL | Status: DC

## 2013-08-20 MED ORDER — GUAIFENESIN 100 MG/5ML PO SYRP
200.0000 mg | ORAL_SOLUTION | ORAL | Status: DC | PRN
  Administered 2013-08-20: 200 mg via ORAL
  Filled 2013-08-20 (×2): qty 10

## 2013-08-20 MED ORDER — WARFARIN SODIUM 2.5 MG PO TABS
2.5000 mg | ORAL_TABLET | Freq: Once | ORAL | Status: AC
  Administered 2013-08-20: 2.5 mg via ORAL
  Filled 2013-08-20: qty 1

## 2013-08-20 MED ORDER — WARFARIN - PHARMACIST DOSING INPATIENT
Freq: Every day | Status: DC
  Administered 2013-08-22 – 2013-08-24 (×2)

## 2013-08-20 MED ORDER — CARVEDILOL 3.125 MG PO TABS
3.1250 mg | ORAL_TABLET | Freq: Two times a day (BID) | ORAL | Status: DC
  Administered 2013-08-20: 3.125 mg via ORAL
  Filled 2013-08-20 (×3): qty 1

## 2013-08-20 NOTE — Progress Notes (Signed)
Echo Lab  2D Echocardiogram completed.  Garison Genova L Enis Leatherwood, RDCS 08/20/2013 10:42 AM

## 2013-08-20 NOTE — Progress Notes (Signed)
ANTICOAGULATION CONSULT NOTE - Follow Up Consult  Pharmacy Consult for heparin Indication: atrial fibrillation  Labs:  Recent Labs  08/19/13 1642 08/19/13 1946 08/20/13 0142 08/20/13 0146  HGB 13.3  --  12.6  --   HCT 38.5  --  37.3  --   PLT 273  --  259  --   LABPROT  --   --  15.3*  --   INR  --   --  1.24  --   HEPARINUNFRC  --   --  0.40  --   CREATININE 0.85  --  0.84  --   TROPONINI <0.30 <0.30  --  <0.30    Assessment/Plan:  80yo female therapeutic on heparin with initial dosing for Afib.  Will continue gtt at current rate and confirm stable with additional level.  Vernard Gambles, PharmD, BCPS  08/20/2013,2:42 AM

## 2013-08-20 NOTE — Progress Notes (Signed)
Utilization review completed. Estephani Popper, RN, BSN. 

## 2013-08-20 NOTE — Progress Notes (Signed)
Subjective: 80 y.o woman not feeling well x 2 weeks saw PCP 2/12 and found to be in AFib with RVR (Hx Afib) rate in the 170s.  She c/o +sob with exertion, +sob, denies palpitations, cough w/ white phlegm x 2 year, lower extremity edema R>L x 2 weeks, c/o dizziness, weak w/in 2 weeks, with Dr. Rennis Golden BP was 102/60.  Had to wear O2 which normally does not.  This am on room air  RN: Dilt gtt at 5 mg due to freq 2.5 sec pauses with rate 10.  HR now 70s-90s with decrease Dilt gtt. HR increased to 130s this am with movement so increased Dilt to rate of 10.    Objective: Vital signs in last 24 hours: Filed Vitals:   08/20/13 0400 08/20/13 0500 08/20/13 0600 08/20/13 0800  BP: 114/62  119/74   Pulse: 92  60   Temp:    98.7 F (37.1 C)  TempSrc:    Oral  Resp: 16  20   Height:      Weight:  172 lb 13.5 oz (78.4 kg)    SpO2: 94%  95% 97%   Weight change:   Intake/Output Summary (Last 24 hours) at 08/20/13 0942 Last data filed at 08/20/13 0900  Gross per 24 hour  Intake 899.49 ml  Output    575 ml  Net 324.49 ml   Vitals reviewed. General: resting in recliner, NAD HEENT: [place]/at no scleral icterus Cardiac: AF, no rubs, murmurs or gallops Pulm: clear to auscultation bilaterally, no wheezes, rales, or rhonchi Abd: soft, nontender, nondistended, BS present Ext: warm and well perfused, no pedal edema Neuro: alert and oriented X3, cranial nerves II-XII grossly intact, moving all 4 extremities  Lab Results: Basic Metabolic Panel:  Recent Labs Lab 08/19/13 1642 08/19/13 1946 08/20/13 0142  NA 136*  --  133*  K 4.0  --  3.8  CL 92*  --  92*  CO2 27  --  29  GLUCOSE 108*  --  110*  BUN 18  --  18  CREATININE 0.85  --  0.84  CALCIUM 9.4  --  9.4  MG  --  2.3  --     CBC:  Recent Labs Lab 08/19/13 1642 08/20/13 0142  WBC 5.9 8.6  NEUTROABS 4.1  --   HGB 13.3 12.6  HCT 38.5 37.3  MCV 88.9 89.0  PLT 273 259   Cardiac Enzymes:  Recent Labs Lab 08/19/13 1642  08/19/13 1946 08/20/13 0146  TROPONINI <0.30 <0.30 <0.30   BNP:  Recent Labs Lab 08/19/13 1642 08/19/13 1946  PROBNP 2773.0* 2480.0*    Thyroid Function Tests:  Recent Labs Lab 08/19/13 1946  TSH 2.258   Coagulation:  Recent Labs Lab 08/20/13 0142  LABPROT 15.3*  INR 1.24   Urinalysis:-pending  No results found for this basename: COLORURINE, APPERANCEUR, LABSPEC, PHURINE, GLUCOSEU, HGBUR, BILIRUBINUR, KETONESUR, PROTEINUR, UROBILINOGEN, NITRITE, LEUKOCYTESUR,  in the last 168 hours Misc. Labs: UA, lfts   Micro Results: Recent Results (from the past 240 hour(s))  MRSA PCR SCREENING     Status: None   Collection Time    08/19/13 10:28 PM      Result Value Ref Range Status   MRSA by PCR NEGATIVE  NEGATIVE Final   Comment:            The GeneXpert MRSA Assay (FDA     approved for NASAL specimens     only), is one component of a  comprehensive MRSA colonization     surveillance program. It is not     intended to diagnose MRSA     infection nor to guide or     monitor treatment for     MRSA infections.   Studies/Results: Dg Chest Portable 1 View  08/19/2013   CLINICAL DATA:  Atrial fibrillation, shortness of breath  EXAM: PORTABLE CHEST - 1 VIEW  COMPARISON:  NM MYOCAR MULTI w/SPECT w/WALL MOTION / EF dated 10/09/2009; DG CHEST 1V PORT dated 10/09/2009  FINDINGS: The cardiac silhouette is enlarged. Increased density projects within the right lung base within meniscal apex. There is diffuse prominence of the interstitial markings with greatest confluence in the lung bases. The osseous structures demonstrate no acute abnormalities. Atherosclerotic calcifications identified within the aorta.  IMPRESSION: Findings likely reflecting a pleural effusion, small, right lung base. Atelectasis versus infiltrate within the lung bases right greater than left. Chronic bronchitic changes are also identified.   Electronically Signed   By: Salome Holmes M.D.   On: 08/19/2013 16:23    Medications:  Medications Prior to Admission  Medication Sig Dispense Refill  . aspirin 325 MG tablet Take 325 mg by mouth daily.      [name] atorvastatin (LIPITOR) 10 MG tablet Take 10 mg by mouth daily.      . carvedilol (COREG) 3.125 MG tablet Take 1 tablet (3.125 mg total) by mouth 2 (two) times daily with a meal.  180 tablet  3  . Cholecalciferol (VITAMIN D PO) Take 1,000 mg by mouth daily.       [name] Fexofenadine HCl (ALLEGRA PO) Take 1 tablet by mouth as needed (allergies).       . Fluticasone Propionate (FLONASE NA) Place 1 spray into the nose as needed (allergies).       . folic acid (FOLVITE) 800 MCG tablet Take 800 mcg by mouth daily.      . furosemide (LASIX) 40 MG tablet Take 40 mg by mouth daily.       Scheduled Meds: . aspirin EC  81 mg Oral Daily  . atorvastatin  10 mg Oral q1800  . carvedilol  3.125 mg Oral BID WC  . folic acid  1 mg Oral Daily  . furosemide  40 mg Oral Daily  . potassium chloride  40 mEq Oral Once   Continuous Infusions: . sodium chloride 10 mL/hr at 08/19/13 2235  . diltiazem (CARDIZEM) infusion 10 mg/hr (08/20/13 0742)  . heparin 900 Units/hr (08/19/13 2234)   PRN Meds:.acetaminophen, ALPRAZolam, ondansetron (ZOFRAN) IV  07/22/2012 echo Study Conclusions  - Left ventricle: Systolic function was normal. The estimated ejection fraction was in the range of 55% to 60%. Wall motion was normal; there were no regional wall motion abnormalities. Left ventricular diastolic function parameters were normal. - Aortic valve: Mild regurgitation. - Mitral valve: Mild regurgitation. Valve area by pressure half-time: 2.29cm^2. - Atrial septum: No defect or patent foramen ovale was identified. - Tricuspid valve: Mild-moderate regurgitation.  11/2009 echo with impaired relaxation normal EF 55%  Assessment/Plan: 80 y.o PMH PAF, HTN, HLD, normal nuclear stress test 10/2009.  Presented 2/12 with AF with RVR HR was 173  # Atrial fibrillation with RVR -HR was 173  on admit now 90s. CHADSVASC 4 -pt has previously refused anticoagulation in the past opting for Asprin.  She tells me this am she has been on Coumadin in the past and if she needs to is agreeable to resume -tsh normal  -She was given Dilt 10  mg followed by gtt.  Pt currently on Dilt gtt 10, Heparin gtt  -prior echo with above results  -trop negative x 3  -Consider cardioversion, will order repeat echo, consider Coumadin vs NOAC (i.e Xarelto 15 mg qd) -on Asprin 81, Coreg 3.125 mg bid, Lipitor 10 mg   #Concern for acute CHF -with sob noted prior to admission and AF with RVR c/w tachycardic mediated cardiomyopathy.  -Pro BNP 1224>8250; CXR 1 view with findings likely reflecting a pleural effusion, small, right lung base. Atelectasis versus infiltrate within the lung bases right greater than left. Chronic bronchitic changes are also identified. Weight up 7 lbs from 02/2013  -strict i/o, daily weights  -Lasix 40 mg po ordered daily  #HTN -hypotensive on admission now BP more normotensive  -monitor   #F/E/N -NSL -K 3.8 given Kdur 40 meqx 1  -cardiac diet   #DVT px -Heparin gtt     LOS: 1 day   Annett Gula, MD [phone] 08/20/2013, 9:42 AM  Patient seen, examined. Available data reviewed. Agree with findings, assessment, and plan as outlined by Dr Shirlee Latch. The patient's history was carefully reviewed. She had an episode of atrial fibrillation about 4 years ago and was on warfarin for 2 years. This was discontinued by Dr. Lynnea Ferrier who recommended that she start aspirin 325 mg daily at that time. She had no symptomatic atrial fibrillation until the past several weeks when she notes shortness of breath, malaise, and generalized fatigue. She saw Dr. Rennis Golden yesterday who admitted her with rapid atrial fibrillation, hypotension, and congestive heart failure.  The patient is doing better this morning on IV Cardizem. Her heart rate is now in the low 100s. She did have some pauses less than 3  seconds overnight on IV Cardizem and this had to be reduced.  Her exam reveals an alert, oriented, elderly woman in no distress. Lungs are clear with the exception of inspiratory rales at the bases. Heart is irregularly irregular without murmur or gallop. Abdomen is soft and nontender, there is no peripheral edema noted.  I think for rate control it may be easier to manage her with metoprolol rather than carvedilol in the short-term. Will add metoprolol 25 mg 3 times daily. We'll continue her on IV Cardizem. We discussed anticoagulation which is clearly indicated. She has declined warfarin in the past but seems agreeable to taking it now. We discussed consideration of novel anticoagulant drugs and the pros and cons compared to warfarin. She prefers warfarin as she really did not have problems with this in the past.  Disposition: Continue with adjustment of her rate controlling medicines and initiation of warfarin while on IV heparin over the weekend. If rate control strategy is not successful, would do TEE cardioversion on Monday. If she is tolerating atrial fibrillation with good control of her heart rate, could discharge her on warfarin with elective cardioversion as an outpatient after 3 weeks of therapeutic anticoagulation. Await 2-D echocardiogram today.  Tonny Bollman, M.D. 08/20/2013 10:59 AM

## 2013-08-20 NOTE — Progress Notes (Signed)
ANTICOAGULATION CONSULT NOTE - Follow Up Consult  Pharmacy Consult for Heparin and Warfarin Indication: atrial fibrillation  No Known Allergies  Patient Measurements: Height: 5' 4.5" (163.8 cm) Weight: 172 lb 13.5 oz (78.4 kg) IBW/kg (Calculated) : 55.85 Heparin Dosing Weight: 72.4 kg  Vital Signs: Temp: 98.7 F (37.1 C) (02/13 0800) Temp src: Oral (02/13 0800) BP: 102/68 mmHg (02/13 1000) Pulse Rate: 60 (02/13 0600)  Labs:  Recent Labs  08/19/13 1642 08/19/13 1946 08/20/13 0142 08/20/13 0146 08/20/13 0915  HGB 13.3  --  12.6  --   --   HCT 38.5  --  37.3  --   --   PLT 273  --  259  --   --   LABPROT  --   --  15.3*  --   --   INR  --   --  1.24  --   --   HEPARINUNFRC  --   --  0.40  --  0.56  CREATININE 0.85  --  0.84  --   --   TROPONINI <0.30 <0.30  --  <0.30  --     Estimated Creatinine Clearance: 47.4 ml/min (by C-G formula based on Cr of 0.84).   Medications:  Prescriptions prior to admission  Medication Sig Dispense Refill  . aspirin 325 MG tablet Take 325 mg by mouth daily.      [name] atorvastatin (LIPITOR) 10 MG tablet Take 10 mg by mouth daily.      . carvedilol (COREG) 3.125 MG tablet Take 1 tablet (3.125 mg total) by mouth 2 (two) times daily with a meal.  180 tablet  3  . Cholecalciferol (VITAMIN D PO) Take 1,000 mg by mouth daily.       [name] Fexofenadine HCl (ALLEGRA PO) Take 1 tablet by mouth as needed (allergies).       . Fluticasone Propionate (FLONASE NA) Place 1 spray into the nose as needed (allergies).       . folic acid (FOLVITE) 800 MCG tablet Take 800 mcg by mouth daily.      . furosemide (LASIX) 40 MG tablet Take 40 mg by mouth daily.       Scheduled:  . aspirin EC  81 mg Oral Daily  . atorvastatin  10 mg Oral q1800  . folic acid  1 mg Oral Daily  . furosemide  40 mg Oral Daily  . metoprolol tartrate  25 mg Oral TID    Assessment: 80 yo F to begin heparin bridge to warfarin for AFib. Of note, patient has a h/o PAF and has refused  Coumadin in the past but after speaking with patient today she is agreeable to restarting Coumadin (last on Coumadin ~ 2 years ago and has since been on ASA 325 mg PO daily). Confirmatory heparin level remains therapeutic at 0.56, CBC wnl and stable with no reported s/s bleeding. Will start coumadin today conservatively given her age.   Goal of Therapy:  INR 2-3 Heparin level 0.3-0.7 units/ml Monitor platelets by anticoagulation protocol: Yes   Plan:  - Continue heparin at 900 units/hr - Start warfarin at 2.5 mg PO x 1 tonight - Monitor daily HL, INR, CBC, any s/s bleeding   Margie Billet, PharmD Clinical Pharmacist - Resident Pager: [phone] Pharmacy: [phone] 08/20/2013 11:35 AM

## 2013-08-21 DIAGNOSIS — I509 Heart failure, unspecified: Secondary | ICD-10-CM

## 2013-08-21 LAB — PROTIME-INR
INR: 1.21 (ref 0.00–1.49)
PROTHROMBIN TIME: 15 s (ref 11.6–15.2)

## 2013-08-21 LAB — BASIC METABOLIC PANEL
BUN: 23 mg/dL (ref 6–23)
CO2: 29 mEq/L (ref 19–32)
CREATININE: 1.01 mg/dL (ref 0.50–1.10)
Calcium: 9 mg/dL (ref 8.4–10.5)
Chloride: 95 mEq/L — ABNORMAL LOW (ref 96–112)
GFR, EST AFRICAN AMERICAN: 56 mL/min — AB (ref >90)
GFR, EST NON AFRICAN AMERICAN: 48 mL/min — AB (ref >90)
Glucose, Bld: 117 mg/dL — ABNORMAL HIGH (ref 70–99)
POTASSIUM: 4.4 meq/L (ref 3.7–5.3)
Sodium: 134 mEq/L — ABNORMAL LOW (ref 137–147)

## 2013-08-21 LAB — CBC
HCT: 35.8 % — ABNORMAL LOW (ref 36.0–46.0)
HEMOGLOBIN: 12.1 g/dL (ref 12.0–15.0)
MCH: 30.3 pg (ref 26.0–34.0)
MCHC: 33.8 g/dL (ref 30.0–36.0)
MCV: 89.5 fL (ref 78.0–100.0)
Platelets: 250 10*3/uL (ref 150–400)
RBC: 4 MIL/uL (ref 3.87–5.11)
RDW: 14.7 % (ref 11.5–15.5)
WBC: 6.3 10*3/uL (ref 4.0–10.5)

## 2013-08-21 LAB — HEPARIN LEVEL (UNFRACTIONATED)
HEPARIN UNFRACTIONATED: 0.28 [IU]/mL — AB (ref 0.30–0.70)
Heparin Unfractionated: 0.64 IU/mL (ref 0.30–0.70)

## 2013-08-21 LAB — MAGNESIUM: MAGNESIUM: 2.2 mg/dL (ref 1.5–2.5)

## 2013-08-21 MED ORDER — METOPROLOL TARTRATE 50 MG PO TABS
50.0000 mg | ORAL_TABLET | Freq: Three times a day (TID) | ORAL | Status: DC
  Administered 2013-08-21 – 2013-08-24 (×9): 50 mg via ORAL
  Filled 2013-08-21 (×14): qty 1

## 2013-08-21 MED ORDER — HEPARIN (PORCINE) IN NACL 100-0.45 UNIT/ML-% IJ SOLN
INTRAMUSCULAR | Status: AC
  Filled 2013-08-21: qty 250

## 2013-08-21 MED ORDER — WARFARIN SODIUM 2.5 MG PO TABS
2.5000 mg | ORAL_TABLET | Freq: Once | ORAL | Status: AC
  Administered 2013-08-21: 2.5 mg via ORAL
  Filled 2013-08-21: qty 1

## 2013-08-21 NOTE — ED Provider Notes (Signed)
I saw and evaluated the patient, reviewed the resident's note and I agree with the findings and plan.   Patient with A. fib with RVR. Heart rate 170, but controlled with IV Cardizem. Lab work overall reassuring. Admitted to cardiology.  I saw and evaluated the patient, reviewed the resident's note and I agree with the findings and plan.    Date: 08/21/2013  Rate: 170  Rhythm: atrial fibrillation  QRS Axis: normal  Intervals: normal  ST/T Wave abnormalities: nonspecific ST/T changes  Conduction Disutrbances:none  Narrative Interpretation: afib with RVR is new  Old EKG Reviewed: changes noted    Juliet Rude. Rubin Payor, MD 08/21/13 [phone]

## 2013-08-21 NOTE — Progress Notes (Signed)
Subjective: 80 y.o woman not feeling well x 2 weeks saw PCP 2/12 and found to be in AFib with RVR (Hx Afib) rate in the 170s.    Yesterday metoprolol added to cardizem. HR still up 100-140 at rest. Dyspneic with ay movement. No pauses.   Echo 55-60% LA 3.0 cm  Tele: AF 100-140. No pauses  Objective: Vital signs in last 24 hours: Filed Vitals:   08/21/13 0400 08/21/13 0429 08/21/13 0613 08/21/13 0755  BP: 102/49     Pulse: 81     Temp:  98.3 F (36.8 C)  97.9 F (36.6 C)  TempSrc:  Oral  Oral  Resp: 15     Height:      Weight:   79.1 kg (174 lb 6.1 oz)   SpO2: 94%      Weight change: -0.3 kg (-10.6 oz)  Intake/Output Summary (Last 24 hours) at 08/21/13 0926 Last data filed at 08/21/13 0200  Gross per 24 hour  Intake    936 ml  Output   1400 ml  Net   -464 ml   Vitals reviewed. General: resting in recliner, NAD HEENT: [place]/at no scleral icterus Cardiac: AF, tachy no rubs, murmurs or gallops Pulm: clear to auscultation bilaterally, no wheezes, rales, or rhonchi Abd: soft, nontender, nondistended, BS present Ext: warm and well perfused, no pedal edema Neuro: alert and oriented X3, cranial nerves II-XII grossly intact, moving all 4 extremities  Lab Results: Basic Metabolic Panel:  Recent Labs Lab 08/19/13 1946 08/20/13 0142 08/21/13 0300  NA  --  133* 134*  K  --  3.8 4.4  CL  --  92* 95*  CO2  --  29 29  GLUCOSE  --  110* 117*  BUN  --  18 23  CREATININE  --  0.84 1.01  CALCIUM  --  9.4 9.0  MG 2.3  --  2.2    CBC:  Recent Labs Lab 08/19/13 1642 08/20/13 0142 08/21/13 0300  WBC 5.9 8.6 6.3  NEUTROABS 4.1  --   --   HGB 13.3 12.6 12.1  HCT 38.5 37.3 35.8*  MCV 88.9 89.0 89.5  PLT 273 259 250   Cardiac Enzymes:  Recent Labs Lab 08/19/13 1642 08/19/13 1946 08/20/13 0146  TROPONINI <0.30 <0.30 <0.30   BNP:  Recent Labs Lab 08/19/13 1642 08/19/13 1946  PROBNP 2773.0* 2480.0*    Thyroid Function Tests:  Recent Labs Lab  08/19/13 1946  TSH 2.258   Coagulation:  Recent Labs Lab 08/20/13 0142 08/21/13 0300  LABPROT 15.3* 15.0  INR 1.24 1.21   Urinalysis:-pending   Recent Labs Lab 08/20/13 1003  COLORURINE YELLOW  LABSPEC 1.020  PHURINE 5.5  GLUCOSEU NEGATIVE  HGBUR LARGE*  BILIRUBINUR NEGATIVE  KETONESUR 15*  PROTEINUR NEGATIVE  UROBILINOGEN 0.2  NITRITE NEGATIVE  LEUKOCYTESUR MODERATE*   Misc. Labs: UA, lfts   Micro Results: Recent Results (from the past 240 hour(s))  MRSA PCR SCREENING     Status: None   Collection Time    08/19/13 10:28 PM      Result Value Ref Range Status   MRSA by PCR NEGATIVE  NEGATIVE Final   Comment:            The GeneXpert MRSA Assay (FDA     approved for NASAL specimens     only), is one component of a     comprehensive MRSA colonization     surveillance program. It is not  intended to diagnose MRSA     infection nor to guide or     monitor treatment for     MRSA infections.   Studies/Results: Dg Chest Portable 1 View  08/19/2013   CLINICAL DATA:  Atrial fibrillation, shortness of breath  EXAM: PORTABLE CHEST - 1 VIEW  COMPARISON:  NM MYOCAR MULTI w/SPECT w/WALL MOTION / EF dated 10/09/2009; DG CHEST 1V PORT dated 10/09/2009  FINDINGS: The cardiac silhouette is enlarged. Increased density projects within the right lung base within meniscal apex. There is diffuse prominence of the interstitial markings with greatest confluence in the lung bases. The osseous structures demonstrate no acute abnormalities. Atherosclerotic calcifications identified within the aorta.  IMPRESSION: Findings likely reflecting a pleural effusion, small, right lung base. Atelectasis versus infiltrate within the lung bases right greater than left. Chronic bronchitic changes are also identified.   Electronically Signed   By: Salome Holmes M.D.   On: 08/19/2013 16:23   Medications:  Medications Prior to Admission  Medication Sig Dispense Refill  . aspirin 325 MG tablet Take 325  mg by mouth daily.      [name] atorvastatin (LIPITOR) 10 MG tablet Take 10 mg by mouth daily.      . carvedilol (COREG) 3.125 MG tablet Take 1 tablet (3.125 mg total) by mouth 2 (two) times daily with a meal.  180 tablet  3  . Cholecalciferol (VITAMIN D PO) Take 1,000 mg by mouth daily.       [name] Fexofenadine HCl (ALLEGRA PO) Take 1 tablet by mouth as needed (allergies).       . Fluticasone Propionate (FLONASE NA) Place 1 spray into the nose as needed (allergies).       . folic acid (FOLVITE) 800 MCG tablet Take 800 mcg by mouth daily.      . furosemide (LASIX) 40 MG tablet Take 40 mg by mouth daily.       Scheduled Meds: . aspirin EC  81 mg Oral Daily  . atorvastatin  10 mg Oral q1800  . folic acid  1 mg Oral Daily  . furosemide  40 mg Oral Daily  . metoprolol tartrate  25 mg Oral TID  . Warfarin - Pharmacist Dosing Inpatient   Does not apply q1800   Continuous Infusions: . sodium chloride 10 mL/hr at 08/19/13 2235  . diltiazem (CARDIZEM) infusion 5 mg/hr (08/21/13 0534)  . heparin 1,000 Units/hr (08/21/13 0706)   PRN Meds:.acetaminophen, ALPRAZolam, guaifenesin, ondansetron (ZOFRAN) IV  07/22/2012 echo Study Conclusions  - Left ventricle: Systolic function was normal. The estimated ejection fraction was in the range of 55% to 60%. Wall motion was normal; there were no regional wall motion abnormalities. Left ventricular diastolic function parameters were normal. - Aortic valve: Mild regurgitation. - Mitral valve: Mild regurgitation. Valve area by pressure half-time: 2.29cm^2. - Atrial septum: No defect or patent foramen ovale was identified. - Tricuspid valve: Mild-moderate regurgitation.  11/2009 echo with impaired relaxation normal EF 55%  Assessment/Plan: 80 y.o PMH PAF, HTN, HLD, normal nuclear stress test 10/2009.  Presented 2/12 with AF with RVR HR was 173  # Atrial fibrillation with RVR  #Acute diastolic HF in setting of AF with RVR  #HTN  #Hypokalemia  Remains  symptomatic with rapid rates despite fairly significant doses of AVN blockers. Will need TEE/DC-CV on Monday. Increase metoprolol for now. Continue heparin/coumadin (NOACs discussed, she prefers warfarin). Main question is whether or not she will need AA agent to help maintain NSR. As this is her  1st episode in 4 years and LA size/EF are normal will try without for now. Volume status ok.     LOS: 2 days   Dolores Patty, MD  08/21/2013, 9:26 AM

## 2013-08-21 NOTE — Progress Notes (Signed)
ANTICOAGULATION CONSULT NOTE - Follow Up Consult  Pharmacy Consult for heparin Indication: atrial fibrillation  Labs:  Recent Labs  08/19/13 1642 08/19/13 1946 08/20/13 0142 08/20/13 0146 08/20/13 0915 08/21/13 0300  HGB 13.3  --  12.6  --   --  12.1  HCT 38.5  --  37.3  --   --  35.8*  PLT 273  --  259  --   --  250  LABPROT  --   --  15.3*  --   --  15.0  INR  --   --  1.24  --   --  1.21  HEPARINUNFRC  --   --  0.40  --  0.56 0.28*  CREATININE 0.85  --  0.84  --   --  1.01  TROPONINI <0.30 <0.30  --  <0.30  --   --     Assessment: 80yo female now subtherapeutic on heparin after two levels at goal.  Goal of Therapy:  Heparin level 0.3-0.7 units/ml   Plan:  Will increase heparin gtt by 1 unit/kg/hr to 1000 units/hr and check level in 8hr.  Vernard Gambles, PharmD, BCPS  08/21/2013,5:40 AM

## 2013-08-21 NOTE — Progress Notes (Signed)
ANTICOAGULATION CONSULT NOTE - Follow Up Consult  Pharmacy Consult for Heparin and Warfarin Indication: atrial fibrillation  No Known Allergies  Patient Measurements: Height: 5' 4.5" (163.8 cm) Weight: 174 lb 6.1 oz (79.1 kg) IBW/kg (Calculated) : 55.85 Heparin Dosing Weight: 72.4 kg  Vital Signs: Temp: 98 F (36.7 C) (02/14 1155) Temp src: Oral (02/14 1155) BP: 127/105 mmHg (02/14 1200) Pulse Rate: 85 (02/14 1200)  Labs:  Recent Labs  08/19/13 1642 08/19/13 1946  08/20/13 0142 08/20/13 0146 08/20/13 0915 08/21/13 0300 08/21/13 1425  HGB 13.3  --   --  12.6  --   --  12.1  --   HCT 38.5  --   --  37.3  --   --  35.8*  --   PLT 273  --   --  259  --   --  250  --   LABPROT  --   --   --  15.3*  --   --  15.0  --   INR  --   --   --  1.24  --   --  1.21  --   HEPARINUNFRC  --   --   < > 0.40  --  0.56 0.28* 0.64  CREATININE 0.85  --   --  0.84  --   --  1.01  --   TROPONINI <0.30 <0.30  --   --  <0.30  --   --   --   < > = values in this interval not displayed.  Estimated Creatinine Clearance: 39.6 ml/min (by C-G formula based on Cr of 1.01).   Medications:  Prescriptions prior to admission  Medication Sig Dispense Refill  . aspirin 325 MG tablet Take 325 mg by mouth daily.      [name] atorvastatin (LIPITOR) 10 MG tablet Take 10 mg by mouth daily.      . carvedilol (COREG) 3.125 MG tablet Take 1 tablet (3.125 mg total) by mouth 2 (two) times daily with a meal.  180 tablet  3  . Cholecalciferol (VITAMIN D PO) Take 1,000 mg by mouth daily.       [name] Fexofenadine HCl (ALLEGRA PO) Take 1 tablet by mouth as needed (allergies).       . Fluticasone Propionate (FLONASE NA) Place 1 spray into the nose as needed (allergies).       . folic acid (FOLVITE) 800 MCG tablet Take 800 mcg by mouth daily.      . furosemide (LASIX) 40 MG tablet Take 40 mg by mouth daily.       Scheduled:  . aspirin EC  81 mg Oral Daily  . atorvastatin  10 mg Oral q1800  . folic acid  1 mg Oral Daily  .  furosemide  40 mg Oral Daily  . metoprolol tartrate  50 mg Oral TID  . Warfarin - Pharmacist Dosing Inpatient   Does not apply q1800    Assessment: 80 yo F to begin heparin bridge to warfarin for AFib. Of note, patient has a h/o PAF and has refused Coumadin in the past but after speaking with patient today she is agreeable to restarting Coumadin (last on Coumadin ~ 2 years ago and has since been on ASA 325 mg PO daily). HL now therapeutic at 0.64 after slight rate increase this am. CBC wnl and stable with no reported s/s bleeding.   Goal of Therapy:  INR 2-3 Heparin level 0.3-0.7 units/ml Monitor platelets by anticoagulation protocol: Yes   Plan:  -  Continue heparin at 1000 units/hr - Warfarin 2.5 mg PO x 1 tonight - Monitor daily HL, INR, CBC, any s/s bleeding   Margie Billet, PharmD Clinical Pharmacist - Resident Pager: [phone] Pharmacy: [phone] 08/21/2013 3:42 PM

## 2013-08-22 DIAGNOSIS — I5031 Acute diastolic (congestive) heart failure: Principal | ICD-10-CM

## 2013-08-22 LAB — PROTIME-INR
INR: 1.13 (ref 0.00–1.49)
Prothrombin Time: 14.3 seconds (ref 11.6–15.2)

## 2013-08-22 LAB — CBC
HEMATOCRIT: 35.6 % — AB (ref 36.0–46.0)
Hemoglobin: 12.2 g/dL (ref 12.0–15.0)
MCH: 30.7 pg (ref 26.0–34.0)
MCHC: 34.3 g/dL (ref 30.0–36.0)
MCV: 89.4 fL (ref 78.0–100.0)
PLATELETS: 253 10*3/uL (ref 150–400)
RBC: 3.98 MIL/uL (ref 3.87–5.11)
RDW: 14.9 % (ref 11.5–15.5)
WBC: 6.3 10*3/uL (ref 4.0–10.5)

## 2013-08-22 LAB — HEPARIN LEVEL (UNFRACTIONATED)
Heparin Unfractionated: 0.74 IU/mL — ABNORMAL HIGH (ref 0.30–0.70)
Heparin Unfractionated: 0.67 IU/mL (ref 0.30–0.70)

## 2013-08-22 MED ORDER — SODIUM CHLORIDE 0.9 % IJ SOLN: 3.0000 mL | Freq: Two times a day (BID) | INTRAMUSCULAR | Status: DC

## 2013-08-22 MED ORDER — SODIUM CHLORIDE 0.9 % IV SOLN: 250.0000 mL | INTRAVENOUS | Status: DC

## 2013-08-22 MED ORDER — SODIUM CHLORIDE 0.9 % IV SOLN: INTRAVENOUS | Status: DC

## 2013-08-22 MED ORDER — WARFARIN SODIUM 5 MG PO TABS
5.0000 mg | ORAL_TABLET | Freq: Once | ORAL | Status: AC
  Administered 2013-08-22: 5 mg via ORAL
  Filled 2013-08-22: qty 1

## 2013-08-22 MED ORDER — SODIUM CHLORIDE 0.9 % IJ SOLN
3.0000 mL | INTRAMUSCULAR | Status: DC | PRN
  Administered 2013-08-24: 09:00:00 via INTRAVENOUS

## 2013-08-22 NOTE — Progress Notes (Signed)
ANTICOAGULATION CONSULT NOTE - Follow Up Consult  Pharmacy Consult for Heparin and Warfarin Indication: atrial fibrillation  No Known Allergies  Patient Measurements: Height: 5' 4.5" (163.8 cm) Weight: 174 lb 13.2 oz (79.3 kg) IBW/kg (Calculated) : 55.85 Heparin Dosing Weight: 72.4 kg  Vital Signs: Temp: 97.9 F (36.6 C) (02/15 1213) Temp src: Oral (02/15 1213) BP: 117/67 mmHg (02/15 1213) Pulse Rate: 73 (02/15 1213)  Labs:  Recent Labs  08/19/13 1642 08/19/13 1946 08/20/13 0142 08/20/13 0146  08/21/13 0300 08/21/13 1425 08/22/13 0305 08/22/13 1232  HGB 13.3  --  12.6  --   --  12.1  --  12.2  --   HCT 38.5  --  37.3  --   --  35.8*  --  35.6*  --   PLT 273  --  259  --   --  250  --  253  --   LABPROT  --   --  15.3*  --   --  15.0  --  14.3  --   INR  --   --  1.24  --   --  1.21  --  1.13  --   HEPARINUNFRC  --   --  0.40  --   < > 0.28* 0.64 0.74* 0.67  CREATININE 0.85  --  0.84  --   --  1.01  --   --   --   TROPONINI <0.30 <0.30  --  <0.30  --   --   --   --   --   < > = values in this interval not displayed.  Estimated Creatinine Clearance: 39.7 ml/min (by C-G formula based on Cr of 1.01).   Medications:  Prescriptions prior to admission  Medication Sig Dispense Refill  . aspirin 325 MG tablet Take 325 mg by mouth daily.      [name] atorvastatin (LIPITOR) 10 MG tablet Take 10 mg by mouth daily.      . carvedilol (COREG) 3.125 MG tablet Take 1 tablet (3.125 mg total) by mouth 2 (two) times daily with a meal.  180 tablet  3  . Cholecalciferol (VITAMIN D PO) Take 1,000 mg by mouth daily.       [name] Fexofenadine HCl (ALLEGRA PO) Take 1 tablet by mouth as needed (allergies).       . Fluticasone Propionate (FLONASE NA) Place 1 spray into the nose as needed (allergies).       . folic acid (FOLVITE) 800 MCG tablet Take 800 mcg by mouth daily.      . furosemide (LASIX) 40 MG tablet Take 40 mg by mouth daily.       Scheduled:  . aspirin EC  81 mg Oral Daily  .  atorvastatin  10 mg Oral q1800  . folic acid  1 mg Oral Daily  . furosemide  40 mg Oral Daily  . metoprolol tartrate  50 mg Oral TID  . sodium chloride  3 mL Intravenous Q12H  . Warfarin - Pharmacist Dosing Inpatient   Does not apply q1800    Assessment: 80 yo F to begin heparin bridge to warfarin for AFib. Of note, patient has a h/o PAF and has refused Coumadin in the past but after speaking with patient today she is agreeable to restarting Coumadin (last on Coumadin ~ 2 years ago and has since been on ASA 325 mg PO daily). HL now therapeutic at 0.67 after slight rate decrease this am. CBC wnl and stable with no reported  s/s bleeding. Coumadin was started on 2/13 and INR remains SUBtherapeutic at 1.13. Will increase coumadin dose tonight.   Goal of Therapy:  INR 2-3 Heparin level 0.3-0.7 units/ml Monitor platelets by anticoagulation protocol: Yes   Plan:  - Continue heparin at 950 units/hr - Warfarin 5 mg PO x 1 tonight - Monitor daily HL, INR, CBC, any s/s bleeding   Margie Billet, PharmD Clinical Pharmacist - Resident Pager: [phone] Pharmacy: [phone] 08/22/2013 2:10 PM

## 2013-08-22 NOTE — Progress Notes (Signed)
Subjective: 80 y.o woman not feeling well x 2 weeks saw PCP 2/12 and found to be in AFib with RVR (Hx Afib) rate in the 170s.    Now on metoprolol and cardizem. HR came down overnight now 60-80 range. Nevertheless remains dyspneic with talking  Echo 55-60% LA 3.0 cm  Tele: AF 60-80. No pauses  Objective: Vital signs in last 24 hours: Filed Vitals:   08/22/13 0400 08/22/13 0600 08/22/13 0804 08/22/13 1000  BP: 97/66 113/41 110/37 110/69  Pulse: 84 74 62   Temp: 98.2 F (36.8 C)  97.7 F (36.5 C)   TempSrc: Oral  Oral   Resp: 15 20 18 25   Height:      Weight: 79.3 kg (174 lb 13.2 oz)     SpO2: 94%  96%    Weight change: 0.2 kg (7.1 oz)  Intake/Output Summary (Last 24 hours) at 08/22/13 1056 Last data filed at 08/22/13 1000  Gross per 24 hour  Intake 1187.23 ml  Output   1850 ml  Net -662.77 ml   Vitals reviewed. General: resting in recliner, NAD HEENT: [place]/at no scleral icterus Cardiac: Irregular no rubs, murmurs or gallops Pulm: clear to auscultation bilaterally, no wheezes, rales, or rhonchi Abd: soft, nontender, nondistended, BS present Ext: warm and well perfused, no pedal edema Neuro: alert and oriented X3, cranial nerves II-XII grossly intact, moving all 4 extremities  Lab Results: Basic Metabolic Panel:  Recent Labs Lab 08/19/13 1946 08/20/13 0142 08/21/13 0300  NA  --  133* 134*  K  --  3.8 4.4  CL  --  92* 95*  CO2  --  29 29  GLUCOSE  --  110* 117*  BUN  --  18 23  CREATININE  --  0.84 1.01  CALCIUM  --  9.4 9.0  MG 2.3  --  2.2    CBC:  Recent Labs Lab 08/19/13 1642  08/21/13 0300 08/22/13 0305  WBC 5.9  < > 6.3 6.3  NEUTROABS 4.1  --   --   --   HGB 13.3  < > 12.1 12.2  HCT 38.5  < > 35.8* 35.6*  MCV 88.9  < > 89.5 89.4  PLT 273  < > 250 253  < > = values in this interval not displayed. Cardiac Enzymes:  Recent Labs Lab 08/19/13 1642 08/19/13 1946 08/20/13 0146  TROPONINI <0.30 <0.30 <0.30   BNP:  Recent Labs Lab  08/19/13 1642 08/19/13 1946  PROBNP 2773.0* 2480.0*    Thyroid Function Tests:  Recent Labs Lab 08/19/13 1946  TSH 2.258   Coagulation:  Recent Labs Lab 08/20/13 0142 08/21/13 0300 08/22/13 0305  LABPROT 15.3* 15.0 14.3  INR 1.24 1.21 1.13   Urinalysis:-pending   Recent Labs Lab 08/20/13 1003  COLORURINE YELLOW  LABSPEC 1.020  PHURINE 5.5  GLUCOSEU NEGATIVE  HGBUR LARGE*  BILIRUBINUR NEGATIVE  KETONESUR 15*  PROTEINUR NEGATIVE  UROBILINOGEN 0.2  NITRITE NEGATIVE  LEUKOCYTESUR MODERATE*   Misc. Labs: UA, lfts   Micro Results: Recent Results (from the past 240 hour(s))  MRSA PCR SCREENING     Status: None   Collection Time    08/19/13 10:28 PM      Result Value Ref Range Status   MRSA by PCR NEGATIVE  NEGATIVE Final   Comment:            The GeneXpert MRSA Assay (FDA     approved for NASAL specimens     only), is  one component of a     comprehensive MRSA colonization     surveillance program. It is not     intended to diagnose MRSA     infection nor to guide or     monitor treatment for     MRSA infections.   Studies/Results: No results found. Medications:  Medications Prior to Admission  Medication Sig Dispense Refill  . aspirin 325 MG tablet Take 325 mg by mouth daily.      [name] atorvastatin (LIPITOR) 10 MG tablet Take 10 mg by mouth daily.      . carvedilol (COREG) 3.125 MG tablet Take 1 tablet (3.125 mg total) by mouth 2 (two) times daily with a meal.  180 tablet  3  . Cholecalciferol (VITAMIN D PO) Take 1,000 mg by mouth daily.       [name] Fexofenadine HCl (ALLEGRA PO) Take 1 tablet by mouth as needed (allergies).       . Fluticasone Propionate (FLONASE NA) Place 1 spray into the nose as needed (allergies).       . folic acid (FOLVITE) 800 MCG tablet Take 800 mcg by mouth daily.      . furosemide (LASIX) 40 MG tablet Take 40 mg by mouth daily.       Scheduled Meds: . aspirin EC  81 mg Oral Daily  . atorvastatin  10 mg Oral q1800  . folic acid   1 mg Oral Daily  . furosemide  40 mg Oral Daily  . metoprolol tartrate  50 mg Oral TID  . Warfarin - Pharmacist Dosing Inpatient   Does not apply q1800   Continuous Infusions: . sodium chloride 10 mL/hr at 08/22/13 0600  . diltiazem (CARDIZEM) infusion 5 mg/hr (08/22/13 0600)  . heparin 950 Units/hr (08/22/13 0427)   PRN Meds:.acetaminophen, ALPRAZolam, guaifenesin, ondansetron (ZOFRAN) IV  07/22/2012 echo Study Conclusions  - Left ventricle: Systolic function was normal. The estimated ejection fraction was in the range of 55% to 60%. Wall motion was normal; there were no regional wall motion abnormalities. Left ventricular diastolic function parameters were normal. - Aortic valve: Mild regurgitation. - Mitral valve: Mild regurgitation. Valve area by pressure half-time: 2.29cm^2. - Atrial septum: No defect or patent foramen ovale was identified. - Tricuspid valve: Mild-moderate regurgitation.  11/2009 echo with impaired relaxation normal EF 55%  Assessment/Plan: 80 y.o PMH PAF, HTN, HLD, normal nuclear stress test 10/2009.  Presented 2/12 with AF with RVR HR was 173  # Atrial fibrillation with RVR  #Acute diastolic HF in setting of AF with RVR  #HTN  #Hypokalemia  Remains symptomatic despite rate control. Will need TEE/DC-CV tomorrow. Will stop IV diltiazem tonight to reduce risk of severe bradycardia after DC-CV.   Continue heparin/coumadin (NOACs discussed, she prefers warfarin). Main question is whether or not she will need AA agent to help maintain NSR. As this is her 1st episode in 4 years and LA size/EF are normal will try without for now. Volume status ok.     LOS: 3 days   Dolores Patty, MD  08/22/2013, 10:56 AM

## 2013-08-22 NOTE — Progress Notes (Signed)
ANTICOAGULATION CONSULT NOTE - Follow Up Consult  Pharmacy Consult for heparin Indication: atrial fibrillation  Labs:  Recent Labs  08/19/13 1642 08/19/13 1946 08/20/13 0142 08/20/13 0146  08/21/13 0300 08/21/13 1425 08/22/13 0305  HGB 13.3  --  12.6  --   --  12.1  --  12.2  HCT 38.5  --  37.3  --   --  35.8*  --  35.6*  PLT 273  --  259  --   --  250  --  253  LABPROT  --   --  15.3*  --   --  15.0  --  14.3  INR  --   --  1.24  --   --  1.21  --  1.13  HEPARINUNFRC  --   --  0.40  --   < > 0.28* 0.64 0.74*  CREATININE 0.85  --  0.84  --   --  1.01  --   --   TROPONINI <0.30 <0.30  --  <0.30  --   --   --   --   < > = values in this interval not displayed.  Assessment: 80yo female now supratherapeutic on heparin after one level at goal.  Goal of Therapy:  Heparin level 0.3-0.7 units/ml   Plan:  Will decrease heparin gtt slightly to 950 units/hr and check level in 8hr.  Vernard Gambles, PharmD, BCPS  08/22/2013,4:10 AM

## 2013-08-23 ENCOUNTER — Inpatient Hospital Stay (HOSPITAL_COMMUNITY): Payer: Medicare Other | Admitting: Anesthesiology

## 2013-08-23 ENCOUNTER — Encounter (HOSPITAL_COMMUNITY): Payer: Medicare Other | Admitting: Anesthesiology

## 2013-08-23 ENCOUNTER — Encounter (HOSPITAL_COMMUNITY)
Admission: EM | Disposition: A | Discharge status: Home / Self Care | Payer: Self-pay | Source: Home / Self Care | Attending: Internal Medicine

## 2013-08-23 ENCOUNTER — Encounter (HOSPITAL_COMMUNITY): Payer: Self-pay | Admitting: Anesthesiology

## 2013-08-23 DIAGNOSIS — I10 Essential (primary) hypertension: Secondary | ICD-10-CM

## 2013-08-23 LAB — CBC
HCT: 37.6 % (ref 36.0–46.0)
Hemoglobin: 12.8 g/dL (ref 12.0–15.0)
MCH: 30.7 pg (ref 26.0–34.0)
MCHC: 34 g/dL (ref 30.0–36.0)
MCV: 90.2 fL (ref 78.0–100.0)
PLATELETS: 254 10*3/uL (ref 150–400)
RBC: 4.17 MIL/uL (ref 3.87–5.11)
RDW: 15 % (ref 11.5–15.5)
WBC: 7.5 10*3/uL (ref 4.0–10.5)

## 2013-08-23 LAB — HEPARIN LEVEL (UNFRACTIONATED)
HEPARIN UNFRACTIONATED: 0.77 [IU]/mL — AB (ref 0.30–0.70)
Heparin Unfractionated: 0.67 IU/mL (ref 0.30–0.70)

## 2013-08-23 LAB — PROTIME-INR
INR: 1.15 (ref 0.00–1.49)
PROTHROMBIN TIME: 14.5 s (ref 11.6–15.2)

## 2013-08-23 SURGERY — ECHOCARDIOGRAM, TRANSESOPHAGEAL
Anesthesia: Moderate Sedation

## 2013-08-23 SURGERY — CARDIOVERSION
Anesthesia: Monitor Anesthesia Care

## 2013-08-23 MED ORDER — FENTANYL CITRATE 0.05 MG/ML IJ SOLN
INTRAMUSCULAR | Status: AC
  Administered 2013-08-23: 25 ug
  Filled 2013-08-23: qty 2

## 2013-08-23 MED ORDER — MIDAZOLAM HCL 2 MG/2ML IJ SOLN
INTRAMUSCULAR | Status: AC
  Administered 2013-08-23: 2 mg
  Filled 2013-08-23: qty 2

## 2013-08-23 MED ORDER — WARFARIN SODIUM 5 MG PO TABS
5.0000 mg | ORAL_TABLET | Freq: Once | ORAL | Status: AC
  Administered 2013-08-23: 5 mg via ORAL
  Filled 2013-08-23: qty 1

## 2013-08-23 MED ORDER — MIDAZOLAM HCL 2 MG/2ML IJ SOLN
INTRAMUSCULAR | Status: AC
  Filled 2013-08-23: qty 2

## 2013-08-23 MED ORDER — SODIUM CHLORIDE 0.9 % IV SOLN: INTRAVENOUS | Status: DC

## 2013-08-23 NOTE — Progress Notes (Signed)
  Echocardiogram Echocardiogram Transesophageal has been performed.  Kendra Hughes 08/23/2013, 2:30 PM

## 2013-08-23 NOTE — Interval H&P Note (Signed)
History and Physical Interval Note:  08/23/2013 1:26 PM  Kendra Hughes  has presented today for surgery, with the diagnosis of A FIB  The various methods of treatment have been discussed with the patient and family. After consideration of risks, benefits and other options for treatment, the patient has consented to  Procedure(s): CARDIOVERSION BEDSIDE (N/A) TRANSESOPHAGEAL ECHOCARDIOGRAM (TEE) (N/A) as a surgical intervention .  The patient's history has been reviewed, patient examined, no change in status, stable for surgery.  I have reviewed the patient's chart and labs.  Questions were answered to the patient's satisfaction.     Kendra Hughes

## 2013-08-23 NOTE — Progress Notes (Signed)
TELEMETRY: Reviewed telemetry pt in afib with controlled rate 60-80: Filed Vitals:   08/22/13 2300 08/23/13 0450 08/23/13 0500 08/23/13 0756  BP:    127/47  Pulse:    87  Temp: 97.4 F (36.3 C) 97.5 F (36.4 C)  97.7 F (36.5 C)  TempSrc: Oral Oral  Oral  Resp:    13  Height:      Weight:   175 lb 14.8 oz (79.8 kg)   SpO2: 93% 93%      Intake/Output Summary (Last 24 hours) at 08/23/13 0811 Last data filed at 08/23/13 0500  Gross per 24 hour  Intake  913.5 ml  Output   1350 ml  Net -436.5 ml    SUBJECTIVE Still SOB with any activity. Similar to episode of Afib 4 years ago. No chest pain or dizziness.  LABS: Basic Metabolic Panel:  Recent Labs  81/44/81 0300  NA 134*  K 4.4  CL 95*  CO2 29  GLUCOSE 117*  BUN 23  CREATININE 1.01  CALCIUM 9.0  MG 2.2   Liver Function Tests:  Recent Labs  08/20/13 0915  AST 35  ALT 33  ALKPHOS 77  BILITOT 0.9  PROT 6.8  ALBUMIN 3.6   No results found for this basename: LIPASE, AMYLASE,  in the last 72 hours CBC:  Recent Labs  08/22/13 0305 08/23/13 0424  WBC 6.3 7.5  HGB 12.2 12.8  HCT 35.6* 37.6  MCV 89.4 90.2  PLT 253 254    Radiology/Studies:  Dg Chest Portable 1 View  08/19/2013   CLINICAL DATA:  Atrial fibrillation, shortness of breath  EXAM: PORTABLE CHEST - 1 VIEW  COMPARISON:  NM MYOCAR MULTI w/SPECT w/WALL MOTION / EF dated 10/09/2009; DG CHEST 1V PORT dated 10/09/2009  FINDINGS: The cardiac silhouette is enlarged. Increased density projects within the right lung base within meniscal apex. There is diffuse prominence of the interstitial markings with greatest confluence in the lung bases. The osseous structures demonstrate no acute abnormalities. Atherosclerotic calcifications identified within the aorta.  IMPRESSION: Findings likely reflecting a pleural effusion, small, right lung base. Atelectasis versus infiltrate within the lung bases right greater than left. Chronic bronchitic changes are also  identified.   Electronically Signed   By: Salome Holmes M.D.   On: 08/19/2013 16:23   Ecg: afib with RVR, nonspecific ST-T wave abnormality.  Echo:Study Conclusions  - Left ventricle: The cavity size was normal. Wall thickness was normal. Systolic function was normal. The estimated ejection fraction was in the range of 55% to 60%. Wall motion was normal; there were no regional wall motion abnormalities. Atrial fibrillation limits evaluation of LV diastolic function. E/e' = 10, indeterminate mean LA pressure - Aortic valve: Trivial regurgitation.    PHYSICAL EXAM General: Well developed, elderly, in no acute distress. Head: Normal Neck: Negative for carotid bruits. JVD not elevated. Lungs: Clear bilaterally to auscultation without wheezes, rales, or rhonchi. Breathing is unlabored. Heart: IRRR S1 S2 without murmurs, rubs, or gallops.  Abdomen: Soft, non-tender, non-distended with normoactive bowel sounds. No hepatomegaly. No rebound/guarding. No obvious abdominal masses. Msk:  Strength and tone appears normal for age. Extremities: No clubbing, cyanosis or edema.  Distal pedal pulses are 2+ and equal bilaterally. Neuro: Alert and oriented X 3. Moves all extremities spontaneously. Psych:  Responds to questions appropriately with a normal affect.  ASSESSMENT AND PLAN: 1. Atrial fibrillation. Rate well controlled. Still symptomatic. On IV diltiazem and 5 mg/hr. Continue heparin IV. Coumadin started per pharmacy  protocol. Plan TEE/DCCV today. Patient is NPO. Will need to stay until coumadin therapeutic unless SQ lovenox could be arranged. Should be able to transfer to telemetry post DCCV. 2. HTN-controlled. 3. Acute diastolic CHF secondary to Afib.  Principal Problem:   PAF (paroxysmal atrial fibrillation), with RVR Active Problems:   Atrial fibrillation with RVR   Acute diastolic heart failure    Signed, Talullah Abate [place] MD,FACC 08/23/2013 8:17 AM

## 2013-08-23 NOTE — Interval H&P Note (Signed)
History and Physical Interval Note:  08/23/2013 1:27 PM  Kendra Hughes  has presented today for surgery, with the diagnosis of A FIB  The various methods of treatment have been discussed with the patient and family. After consideration of risks, benefits and other options for treatment, the patient has consented to  Procedure(s): CARDIOVERSION BEDSIDE (N/A) TRANSESOPHAGEAL ECHOCARDIOGRAM (TEE) (N/A) as a surgical intervention .  The patient's history has been reviewed, patient examined, no change in status, stable for surgery.  I have reviewed the patient's chart and labs.  Questions were answered to the patient's satisfaction.     [name]

## 2013-08-23 NOTE — Progress Notes (Addendum)
TEE/DCCV scheduled for this afternoon with Dr. Royann Shivers. Kazandra Forstrom PA-C

## 2013-08-23 NOTE — H&P (View-Only) (Signed)
OFFICE NOTE  Chief Complaint:  "I can't breathe"  Primary Care Physician: Carmin Richmond, MD  HPI:  Kendra Hughes is an 80 year old female who I've been following for some time with a history of paroxysmal atrial fibrillation. Despite her CHADS2 score of 2, she has refused warfarin anticoagulation in the past, opting for aspirin which are documented extensively he is insufficient to protect her from stroke. She reports though over the past 2 weeks she's had worsening shortness of breath. Apparently she and her husband tried to call the office to schedule an appointment with me but could not get an appointment. It is unclear whether they did not want to see a mid-level provider or whether that was offered or not however she waited to see me today. In the office she was notably breathless and can hardly finish sentences. An EKG demonstrated atrial fibrillation with rapid ventricular response at a rate of 173. She did appear somewhat dizzy and her blood pressure was low around 100 systolic.  She denied any chest pain.  PMHx:  Past Medical History  Diagnosis Date  . PAF (paroxysmal atrial fibrillation)   . Hypertension   . Dyslipidemia   . DOE (dyspnea on exertion)   . History of nuclear stress test 10/2009    normal exam    Past Surgical History  Procedure Laterality Date  . Abdominal hysterectomy    . Biopsy breast      right  . Transthoracic echocardiogram  07/22/2012    EF 55-60%; mild AV regurg; mild MR; mild-mod TR    FAMHx:  No family history on file.  SOCHx:   reports that she has never smoked. She has never used smokeless tobacco. She reports that she does not drink alcohol or use illicit drugs.  ALLERGIES:  No Known Allergies  ROS: A comprehensive review of systems was negative except for: Constitutional: positive for fatigue Respiratory: positive for cough and dyspnea on exertion Cardiovascular: positive for dyspnea, irregular heart beat and lower extremity  edema  HOME MEDS: No current facility-administered medications for this visit.   Current Outpatient Prescriptions  Medication Sig Dispense Refill  . aspirin 325 MG tablet Take 325 mg by mouth daily.      [name] atorvastatin (LIPITOR) 10 MG tablet Take 10 mg by mouth daily.      . carvedilol (COREG) 3.125 MG tablet Take 1 tablet (3.125 mg total) by mouth 2 (two) times daily with a meal.  180 tablet  3  . Cholecalciferol (VITAMIN D PO) Take 1,000 mg by mouth daily.       [name] Fexofenadine HCl (ALLEGRA PO) Take by mouth as needed.      . Fluticasone Propionate (FLONASE NA) Place into the nose as needed.      . folic acid (FOLVITE) 800 MCG tablet Take 800 mcg by mouth daily.      . furosemide (LASIX) 40 MG tablet Take 40 mg by mouth daily.       Facility-Administered Medications Ordered in Other Visits  Medication Dose Route Frequency Provider Last Rate Last Dose  . diltiazem (CARDIZEM) 100 mg in dextrose 5 % 100 mL infusion  5-15 mg/hr Intravenous Continuous Jon Gills, MD 15 mL/hr at 08/19/13 1731 15 mg/hr at 08/19/13 1731    LABS/IMAGING: No results found for this or any previous visit (from the past 48 hour(s)). Dg Chest Portable 1 View  08/19/2013   CLINICAL DATA:  Atrial fibrillation, shortness of breath  EXAM: PORTABLE CHEST - 1  VIEW  COMPARISON:  NM MYOCAR MULTI w/SPECT w/WALL MOTION / EF dated 10/09/2009; DG CHEST 1V PORT dated 10/09/2009  FINDINGS: The cardiac silhouette is enlarged. Increased density projects within the right lung base within meniscal apex. There is diffuse prominence of the interstitial markings with greatest confluence in the lung bases. The osseous structures demonstrate no acute abnormalities. Atherosclerotic calcifications identified within the aorta.  IMPRESSION: Findings likely reflecting a pleural effusion, small, right lung base. Atelectasis versus infiltrate within the lung bases right greater than left. Chronic bronchitic changes are also identified.   Electronically  Signed   By: Salome Holmes M.D.   On: 08/19/2013 16:23    VITALS: BP 102/62  Pulse 173  Ht 5\' 4"  (1.626 m)  Wt 175 lb (79.379 kg)  BMI 30.02 kg/m2  EXAM: General appearance: alert and ill-appearing, markedly dyspneic Neck: JVD - 5 cm above sternal notch and no carotid bruit Lungs: dullness to percussion mid-way up both lungs with overlying crackles Heart: irregularly irregulary, very rapid Abdomen: soft, non-tender Extremities: 1+ edema Pulses: difficult to palpate Skin: Skin color, texture, turgor normal. No rashes or lesions Neurologic: Mental status: Awake and oriented Psych: Appropriately anxious, breathless  EKG: Atrial fibrillation with rapid ventricular response at 173  ASSESSMENT: 1. Acute congestive heart failure 2. Atrial fibrillation with rapid ventricular response 3. Hypotension secondary to cardiogenic shock  PLAN: 1.   [name] appears markedly ill today and is in A. fib with a very rapid heart rate. It sounds like she's been sick for several weeks and unfortunately did not present to the emergency room or push to be seen in the office. She is clearly in heart failure and has elevated neck veins, dull lung fields, lower extremity edema and has had cough for several weeks.  I am concerned that she may develop a tachycardia mediated cardiomyopathy.  I'm also worried that she is refused anticoagulation with warfarin in the past and may have been at much higher risk now for the development of thrombus and stroke.  We have contacted the ambulance to transport her to the [facility].  She needs better rate control, diuresis and heparinization and admission.  I will notify the card master of her disposition.  [facility], MD, [facility] Attending Cardiologist [facility]  HILTY,Kenneth C 08/19/2013, 5:41 PM

## 2013-08-23 NOTE — Progress Notes (Signed)
ANTICOAGULATION CONSULT NOTE - Follow Up Consult  Pharmacy Consult for heparin Indication: atrial fibrillation  Labs:  Recent Labs  08/21/13 0300  08/22/13 0305 08/22/13 1232 08/23/13 0424  HGB 12.1  --  12.2  --  12.8  HCT 35.8*  --  35.6*  --  37.6  PLT 250  --  253  --  254  LABPROT 15.0  --  14.3  --  14.5  INR 1.21  --  1.13  --  1.15  HEPARINUNFRC 0.28*  < > 0.74* 0.67 0.77*  CREATININE 1.01  --   --   --   --   < > = values in this interval not displayed.  Assessment: 80yo female now supratherapeutic on heparin after one level at goal, second day that level has drifted up.  Goal of Therapy:  Heparin level 0.3-0.7 units/ml   Plan:  Will decrease heparin gtt slightly to 850 units/hr and check level in 8hr.  Vernard Gambles, PharmD, BCPS  08/23/2013,5:52 AM

## 2013-08-23 NOTE — Preoperative (Signed)
Beta Blockers   Reason not to administer Beta Blockers:Not Applicable 

## 2013-08-23 NOTE — Anesthesia Preprocedure Evaluation (Addendum)
Anesthesia Evaluation  Patient identified by MRN, date of birth, ID band Patient awake    Reviewed: Allergy & Precautions  Airway Mallampati: II  Neck ROM: Full    Dental  (+) Lower Dentures, Upper Dentures   Pulmonary shortness of breath,    + decreased breath sounds      Cardiovascular Exercise Tolerance: Poor hypertension, Pt. on medications and Pt. on home beta blockers +CHF and + DOE + dysrhythmias Atrial Fibrillation Rhythm:Irregular Rate:Tachycardia  08/20/13 ECHO: EF 55-60%, valves OK '11 stress test: no ischemia, EF 71%   Neuro/Psych    GI/Hepatic   Endo/Other    Renal/GU      Musculoskeletal   Abdominal Normal abdominal exam  (+)   Peds  Hematology   Anesthesia Other Findings   Reproductive/Obstetrics                        Anesthesia Physical Anesthesia Plan  ASA: III  Anesthesia Plan: General   Post-op Pain Management:    Induction: Intravenous  Airway Management Planned: Natural Airway and Simple Face Mask  Additional Equipment:   Intra-op Plan:   Post-operative Plan:   Informed Consent:   Plan Discussed with:   Anesthesia Plan Comments:         Anesthesia Quick Evaluation

## 2013-08-23 NOTE — Progress Notes (Signed)
ANTICOAGULATION CONSULT NOTE   Pharmacy Consult for Heparin and Warfarin Indication: atrial fibrillation  No Known Allergies  Patient Measurements: Height: 5' 4.5" (163.8 cm) Weight: 175 lb 14.8 oz (79.8 kg) IBW/kg (Calculated) : 55.85 Heparin Dosing Weight: 72.4 kg  Vital Signs: Temp: 97.7 F (36.5 C) (02/16 0756) Temp src: Oral (02/16 0756) BP: 118/44 mmHg (02/16 0916) Pulse Rate: 70 (02/16 0916)  Labs:  Recent Labs  08/21/13 0300  08/22/13 0305 08/22/13 1232 08/23/13 0424  HGB 12.1  --  12.2  --  12.8  HCT 35.8*  --  35.6*  --  37.6  PLT 250  --  253  --  254  LABPROT 15.0  --  14.3  --  14.5  INR 1.21  --  1.13  --  1.15  HEPARINUNFRC 0.28*  < > 0.74* 0.67 0.77*  CREATININE 1.01  --   --   --   --   < > = values in this interval not displayed.  Estimated Creatinine Clearance: 39.8 ml/min (by C-G formula based on Cr of 1.01).   Assessment: 80 yo F on heparin bridge to warfarin for AFib. Of note, patient has a h/o PAF and has refused Coumadin in the past but after speaking with patient she is agreeable to restarting Coumadin (last on Coumadin ~ 2 years ago and has since been on ASA 325 mg PO daily). HL now at goal after slight rate decrease this am. CBC wnl and stable with no reported s/s bleeding. Coumadin was started on 2/13 and INR remains low at 1.1. Will increase coumadin dose tonight.   Goal of Therapy:  INR 2-3 Heparin level 0.3-0.7 units/ml Monitor platelets by anticoagulation protocol: Yes   Plan:  - Decrease heparin to 750 units/hr - Warfarin 5 mg PO x 1 tonight - Monitor daily HL, INR, CBC, any s/s bleeding  Sheppard Coil PharmD., BCPS Clinical Pharmacist Pager [phone] 08/23/2013 10:10 AM

## 2013-08-23 NOTE — Op Note (Signed)
INDICATIONS: Atrial fibrillation precardioversion  PROCEDURE:   Informed consent was obtained prior to the procedure. The risks, benefits and alternatives for the procedure were discussed and the patient comprehended these risks.  Risks include, but are not limited to, cough, sore throat, vomiting, nausea, somnolence, esophageal and stomach trauma or perforation, bleeding, low blood pressure, aspiration, pneumonia, infection, trauma to the teeth and death.    After a procedural time-out, the oropharynx was anesthetized with 20% benzocaine spray. The patient was given 2 mg versed and 25 mcg fentanyl for moderate sedation.   The transesophageal probe was inserted in the esophagus and stomach without difficulty and multiple views were obtained.  The patient was kept under observation until the patient left the procedure room.  The patient left the procedure room in stable condition.   Agitated microbubble saline contrast was not administered.  COMPLICATIONS:    There were no immediate complications.  FINDINGS:  Large thrombus occupying the entire distal half of the left atrial appendage. Dense appendage "smoke" and low emptying velocities  RECOMMENDATIONS:   Defer cardioversion for minimum of 3 weeks of anticoagulation.  Time Spent Directly with the Patient:  45 minutes   Ceria Suminski 08/23/2013, 1:27 PM

## 2013-08-24 LAB — CBC
HEMATOCRIT: 34.9 % — AB (ref 36.0–46.0)
Hemoglobin: 11.7 g/dL — ABNORMAL LOW (ref 12.0–15.0)
MCH: 30 pg (ref 26.0–34.0)
MCHC: 33.5 g/dL (ref 30.0–36.0)
MCV: 89.5 fL (ref 78.0–100.0)
PLATELETS: 263 10*3/uL (ref 150–400)
RBC: 3.9 MIL/uL (ref 3.87–5.11)
RDW: 15.1 % (ref 11.5–15.5)
WBC: 5.9 10*3/uL (ref 4.0–10.5)

## 2013-08-24 LAB — PROTIME-INR
INR: 1.27 (ref 0.00–1.49)
Prothrombin Time: 15.6 seconds — ABNORMAL HIGH (ref 11.6–15.2)

## 2013-08-24 LAB — HEPARIN LEVEL (UNFRACTIONATED)
HEPARIN UNFRACTIONATED: 0.27 [IU]/mL — AB (ref 0.30–0.70)
HEPARIN UNFRACTIONATED: 0.43 [IU]/mL (ref 0.30–0.70)

## 2013-08-24 MED ORDER — DILTIAZEM HCL 30 MG PO TABS
30.0000 mg | ORAL_TABLET | Freq: Four times a day (QID) | ORAL | Status: DC
  Administered 2013-08-24 – 2013-08-25 (×4): 30 mg via ORAL
  Filled 2013-08-24 (×8): qty 1

## 2013-08-24 MED ORDER — WARFARIN SODIUM 7.5 MG PO TABS
7.5000 mg | ORAL_TABLET | Freq: Once | ORAL | Status: AC
  Administered 2013-08-24: 7.5 mg via ORAL
  Filled 2013-08-24: qty 1

## 2013-08-24 NOTE — Progress Notes (Signed)
ANTICOAGULATION CONSULT NOTE - Follow Up Consult  Pharmacy Consult for heparin Indication: atrial fibrillation  Labs:  Recent Labs  08/22/13 0305  08/23/13 0424 08/23/13 1200 08/24/13 0237  HGB 12.2  --  12.8  --  11.7*  HCT 35.6*  --  37.6  --  34.9*  PLT 253  --  254  --  263  LABPROT 14.3  --  14.5  --  15.6*  INR 1.13  --  1.15  --  1.27  HEPARINUNFRC 0.74*  < > 0.77* 0.67 0.27*  < > = values in this interval not displayed.  Assessment: 80yo female now subtherapeutic on heparin after  rate decrease.  Goal of Therapy:  Heparin level 0.3-0.7 units/ml   Plan:  Will increase heparin gtt slightly to 800 units/hr and check level in 8hr.  Vernard Gambles, PharmD, BCPS  08/24/2013,4:08 AM

## 2013-08-24 NOTE — Progress Notes (Signed)
TELEMETRY: Reviewed telemetry pt in afib with controlled rate 60-80: Filed Vitals:   08/24/13 0400 08/24/13 0446 08/24/13 0732 08/24/13 0906  BP: 96/48  111/52 111/52  Pulse:    135  Temp:  98.6 F (37 C) 98.3 F (36.8 C)   TempSrc:  Oral Oral   Resp: 16  15   Height:      Weight:      SpO2:  92% 95%     Intake/Output Summary (Last 24 hours) at 08/24/13 1024 Last data filed at 08/24/13 1000  Gross per 24 hour  Intake  699.5 ml  Output   2725 ml  Net -2025.5 ml    SUBJECTIVE Still SOB with  activity. Some orthopnea. Similar to episode of Afib 4 years ago. No chest pain or dizziness.  LABS: Basic Metabolic Panel: No results found for this basename: NA, K, CL, CO2, GLUCOSE, BUN, CREATININE, CALCIUM, MG, PHOS,  in the last 72 hours Liver Function Tests: No results found for this basename: AST, ALT, ALKPHOS, BILITOT, PROT, ALBUMIN,  in the last 72 hours No results found for this basename: LIPASE, AMYLASE,  in the last 72 hours CBC:  Recent Labs  08/23/13 0424 08/24/13 0237  WBC 7.5 5.9  HGB 12.8 11.7*  HCT 37.6 34.9*  MCV 90.2 89.5  PLT 254 263    Radiology/Studies:  Dg Chest Portable 1 View  08/19/2013   CLINICAL DATA:  Atrial fibrillation, shortness of breath  EXAM: PORTABLE CHEST - 1 VIEW  COMPARISON:  NM MYOCAR MULTI w/SPECT w/WALL MOTION / EF dated 10/09/2009; DG CHEST 1V PORT dated 10/09/2009  FINDINGS: The cardiac silhouette is enlarged. Increased density projects within the right lung base within meniscal apex. There is diffuse prominence of the interstitial markings with greatest confluence in the lung bases. The osseous structures demonstrate no acute abnormalities. Atherosclerotic calcifications identified within the aorta.  IMPRESSION: Findings likely reflecting a pleural effusion, small, right lung base. Atelectasis versus infiltrate within the lung bases right greater than left. Chronic bronchitic changes are also identified.   Electronically Signed   By:  Salome Holmes M.D.   On: 08/19/2013 16:23   Ecg: afib with RVR, nonspecific ST-T wave abnormality.  Echo:Study Conclusions  - Left ventricle: The cavity size was normal. Wall thickness was normal. Systolic function was normal. The estimated ejection fraction was in the range of 55% to 60%. Wall motion was normal; there were no regional wall motion abnormalities. Atrial fibrillation limits evaluation of LV diastolic function. E/e' = 10, indeterminate mean LA pressure - Aortic valve: Trivial regurgitation.  TEE positive LA appendage thrombus  PHYSICAL EXAM General: Well developed, elderly, in no acute distress. Head: Normal Neck: Negative for carotid bruits. JVD not elevated. Lungs: Clear bilaterally to auscultation without wheezes, rales, or rhonchi. Breathing is unlabored. Heart: IRRR S1 S2 without murmurs, rubs, or gallops.  Abdomen: Soft, non-tender, non-distended with normoactive bowel sounds. No hepatomegaly. No rebound/guarding. No obvious abdominal masses. Msk:  Strength and tone appears normal for age. Extremities: No clubbing, cyanosis or edema.  Distal pedal pulses are 2+ and equal bilaterally. Neuro: Alert and oriented X 3. Moves all extremities spontaneously. Psych:  Responds to questions appropriately with a normal affect.  ASSESSMENT AND PLAN: 1. Atrial fibrillation. Rate well controlled. Still symptomatic. On IV diltiazem and 2.5 mg/hr. Will transition to po cardizem today. Continue heparin IV. Coumadin per pharmacy protocol. Home when INR therapeutic. Unable to perform DCCV due to LA thrombus. Will need to allow therapeutic  INR for 4 weeks before considering cardioversion. 2. HTN-controlled. 3. Acute diastolic CHF secondary to Afib.  Principal Problem:   PAF (paroxysmal atrial fibrillation), with RVR Active Problems:   Atrial fibrillation with RVR   Acute diastolic heart failure    Signed, Karissa Meenan [place] MD,FACC 08/24/2013 10:24 AM

## 2013-08-24 NOTE — Progress Notes (Signed)
Transferred to 2 west room 29 by wheelchair, stable, report given to RN, belongings with pt.

## 2013-08-24 NOTE — Progress Notes (Signed)
ANTICOAGULATION CONSULT NOTE   Pharmacy Consult for Heparin and Warfarin Indication: atrial fibrillation  No Known Allergies  Patient Measurements: Height: 5' 4.5" (163.8 cm) Weight: 175 lb 14.8 oz (79.8 kg) IBW/kg (Calculated) : 55.85 Heparin Dosing Weight: 72.4 kg  Vital Signs: Temp: 98.1 F (36.7 C) (02/17 1251) Temp src: Oral (02/17 1130) BP: 122/86 mmHg (02/17 1251) Pulse Rate: 100 (02/17 1251)  Labs:  Recent Labs  08/22/13 0305  08/23/13 0424 08/23/13 1200 08/24/13 0237 08/24/13 1135  HGB 12.2  --  12.8  --  11.7*  --   HCT 35.6*  --  37.6  --  34.9*  --   PLT 253  --  254  --  263  --   LABPROT 14.3  --  14.5  --  15.6*  --   INR 1.13  --  1.15  --  1.27  --   HEPARINUNFRC 0.74*  < > 0.77* 0.67 0.27* 0.43  < > = values in this interval not displayed.  Estimated Creatinine Clearance: 39.8 ml/min (by C-G formula based on Cr of 1.01).   Assessment: 80 yo F on heparin bridge to warfarin for AFib. Of note, patient has a h/o PAF and has refused Coumadin in the past but after speaking with patient she is agreeable to restarting Coumadin (last on Coumadin ~ 2 years ago and has since been on ASA 325 mg PO daily). HL now at goal (0.43) after slight rate increase this am. H/H slightly decreased, platelets remain wnl and stable with no reported s/s bleeding. Coumadin was started on 2/13 and INR remains low at 1.27. Will increase coumadin dose tonight.   Goal of Therapy:  INR 2-3 Heparin level 0.3-0.7 units/ml Monitor platelets by anticoagulation protocol: Yes   Plan:  - Continue heparin to 800 units/hr - Warfarin 7.5 mg PO x 1 tonight - Monitor daily HL, INR, CBC, any s/s bleeding  Margie Billet, PharmD Clinical Pharmacist - Resident Pager: [phone] Pharmacy: [phone] 08/24/2013 1:17 PM

## 2013-08-25 LAB — HEPARIN LEVEL (UNFRACTIONATED): Heparin Unfractionated: 0.47 IU/mL (ref 0.30–0.70)

## 2013-08-25 LAB — PROTIME-INR
INR: 1.78 — ABNORMAL HIGH (ref 0.00–1.49)
Prothrombin Time: 20.2 seconds — ABNORMAL HIGH (ref 11.6–15.2)

## 2013-08-25 LAB — CBC
HCT: 36.4 % (ref 36.0–46.0)
Hemoglobin: 12.2 g/dL (ref 12.0–15.0)
MCH: 30.3 pg (ref 26.0–34.0)
MCHC: 33.5 g/dL (ref 30.0–36.0)
MCV: 90.5 fL (ref 78.0–100.0)
Platelets: 258 10*3/uL (ref 150–400)
RBC: 4.02 MIL/uL (ref 3.87–5.11)
RDW: 15.3 % (ref 11.5–15.5)
WBC: 6.2 10*3/uL (ref 4.0–10.5)

## 2013-08-25 MED ORDER — METOPROLOL TARTRATE 50 MG PO TABS
75.0000 mg | ORAL_TABLET | Freq: Two times a day (BID) | ORAL | Status: DC
  Administered 2013-08-25 – 2013-08-27 (×5): 75 mg via ORAL
  Filled 2013-08-25 (×7): qty 1

## 2013-08-25 MED ORDER — DILTIAZEM HCL ER COATED BEADS 120 MG PO CP24
120.0000 mg | ORAL_CAPSULE | Freq: Every day | ORAL | Status: DC
  Administered 2013-08-25 – 2013-08-27 (×3): 120 mg via ORAL
  Filled 2013-08-25 (×3): qty 1

## 2013-08-25 MED ORDER — WARFARIN SODIUM 7.5 MG PO TABS
7.5000 mg | ORAL_TABLET | Freq: Once | ORAL | Status: AC
  Administered 2013-08-25: 7.5 mg via ORAL
  Filled 2013-08-25: qty 1

## 2013-08-25 NOTE — Progress Notes (Signed)
ANTICOAGULATION CONSULT NOTE   Pharmacy Consult for Heparin and Warfarin Indication: atrial fibrillation  No Known Allergies  Patient Measurements: Height: 5' 4.5" (163.8 cm) Weight: 169 lb 15.6 oz (77.1 kg) IBW/kg (Calculated) : 55.85 Heparin Dosing Weight: 72.4 kg  Vital Signs: Temp: 97.7 F (36.5 C) (02/18 1258) Temp src: Oral (02/18 1258) BP: 88/76 mmHg (02/18 1258) Pulse Rate: 120 (02/18 1258)  Labs:  Recent Labs  08/23/13 0424  08/24/13 0237 08/24/13 1135 08/25/13 0427  HGB 12.8  --  11.7*  --  12.2  HCT 37.6  --  34.9*  --  36.4  PLT 254  --  263  --  258  LABPROT 14.5  --  15.6*  --  20.2*  INR 1.15  --  1.27  --  1.78*  HEPARINUNFRC 0.77*  < > 0.27* 0.43 0.47  < > = values in this interval not displayed.  Estimated Creatinine Clearance: 39.1 ml/min (by C-G formula based on Cr of 1.01).   Assessment: 80 yo F on heparin bridge to warfarin for AFib. Of note, patient has a h/o PAF and has refused Coumadin in the past but now is agreeable to restarting, was previously on ASA 325 mg. Heparin is therapeutic at 800 units/hr. Hgb, platelets remain wnl and stable with no reported s/sx bleeding. Coumadin was started on 2/13, INR remains subtherapeutic at 1.78 but is uptrending and expect it to be therapeutic shortly.  Goal of Therapy:  INR 2-3 Heparin level 0.3-0.7 units/ml Monitor platelets by anticoagulation protocol: Yes   Plan:  - Continue heparin to 800 units/hr - Warfarin 7.5 mg po x 1 - Monitor daily HL, INR, CBC, any s/s bleeding  Agapito Games, PharmD, BCPS Clinical Pharmacist Pager: [phone] 08/25/2013 2:42 PM

## 2013-08-25 NOTE — Progress Notes (Signed)
  Patient Name: Kendra Hughes Date of Encounter: 08/25/2013  Principal Problem:   PAF (paroxysmal atrial fibrillation), with RVR Active Problems:   Atrial fibrillation with RVR   Acute diastolic heart failure   Length of Stay: 6  SUBJECTIVE  Feels reasonably well at rest and walking short distances. HR 85-105, atrial fibrillation INR 1.78  CURRENT MEDS . atorvastatin  10 mg Oral q1800  . diltiazem  120 mg Oral Daily  . folic acid  1 mg Oral Daily  . furosemide  40 mg Oral Daily  . metoprolol tartrate  75 mg Oral BID  . Warfarin - Pharmacist Dosing Inpatient   Does not apply q1800    OBJECTIVE   Intake/Output Summary (Last 24 hours) at 08/25/13 0940 Last data filed at 08/25/13 0416  Gross per 24 hour  Intake  266.5 ml  Output    800 ml  Net -533.5 ml   Filed Weights   08/22/13 0400 08/23/13 0500 08/25/13 0414  Weight: 79.3 kg (174 lb 13.2 oz) 79.8 kg (175 lb 14.8 oz) 77.1 kg (169 lb 15.6 oz)    PHYSICAL EXAM Filed Vitals:   08/24/13 1954 08/24/13 2329 08/25/13 0414 08/25/13 0418  BP: 104/86 100/52  98/56  Pulse: 92   94  Temp: 98.2 F (36.8 C)   97.7 F (36.5 C)  TempSrc: Oral   Oral  Resp: 16   18  Height:      Weight:   77.1 kg (169 lb 15.6 oz)   SpO2: 94%   96%   General: Alert, oriented x3, no distress Head: no evidence of trauma, PERRL, EOMI, no exophtalmos or lid lag, no myxedema, no xanthelasma; normal ears, nose and oropharynx Neck: normal jugular venous pulsations and no hepatojugular reflux; brisk carotid pulses without delay and no carotid bruits Chest: clear to auscultation, no signs of consolidation by percussion or palpation, normal fremitus, symmetrical and full respiratory excursions Cardiovascular: normal position and quality of the apical impulse, irregular rhythm, normal first and second heart sounds, no rubs or gallops, no murmur Abdomen: no tenderness or distention, no masses by palpation, no abnormal pulsatility or arterial bruits, normal  bowel sounds, no hepatosplenomegaly Extremities: no clubbing, cyanosis or edema; 2+ radial, ulnar and brachial pulses bilaterally; 2+ right femoral, posterior tibial and dorsalis pedis pulses; 2+ left femoral, posterior tibial and dorsalis pedis pulses; no subclavian or femoral bruits Neurological: grossly nonfocal  LABS  CBC  Recent Labs  08/24/13 0237 08/25/13 0427  WBC 5.9 6.2  HGB 11.7* 12.2  HCT 34.9* 36.4  MCV 89.5 90.5  PLT 263 258    Radiology Studies Imaging results have been reviewed and No results found.  TELE atrial fibrillation, VR 85-105   ASSESSMENT AND PLAN Will try to simplify meds to once or twice daily regimen. BP does not leave a lot of room for additional rate control. DC home when INR > 2 Reevaluate for elective cardioversion after 3-4 weeks of full anticoagulation.   Thurmon Fair, MD, [facility] [phone] office [phone] pager 08/25/2013 9:40 AM

## 2013-08-26 DIAGNOSIS — I38 Endocarditis, valve unspecified: Secondary | ICD-10-CM | POA: Diagnosis present

## 2013-08-26 DIAGNOSIS — I959 Hypotension, unspecified: Secondary | ICD-10-CM | POA: Diagnosis present

## 2013-08-26 LAB — CBC
HEMATOCRIT: 35.5 % — AB (ref 36.0–46.0)
Hemoglobin: 12 g/dL (ref 12.0–15.0)
MCH: 30.4 pg (ref 26.0–34.0)
MCHC: 33.8 g/dL (ref 30.0–36.0)
MCV: 89.9 fL (ref 78.0–100.0)
Platelets: 242 10*3/uL (ref 150–400)
RBC: 3.95 MIL/uL (ref 3.87–5.11)
RDW: 15 % (ref 11.5–15.5)
WBC: 6.9 10*3/uL (ref 4.0–10.5)

## 2013-08-26 LAB — PROTIME-INR
INR: 3.37 — ABNORMAL HIGH (ref 0.00–1.49)
Prothrombin Time: 32.9 seconds — ABNORMAL HIGH (ref 11.6–15.2)

## 2013-08-26 LAB — HEPARIN LEVEL (UNFRACTIONATED): Heparin Unfractionated: 0.64 IU/mL (ref 0.30–0.70)

## 2013-08-26 MED ORDER — DIGOXIN 125 MCG PO TABS
0.1250 mg | ORAL_TABLET | Freq: Every day | ORAL | Status: DC
Start: 2013-08-27 — End: 2013-08-27
  Administered 2013-08-27: 0.125 mg via ORAL
  Filled 2013-08-26: qty 1

## 2013-08-26 MED ORDER — DIGOXIN 250 MCG PO TABS
0.2500 mg | ORAL_TABLET | Freq: Once | ORAL | Status: AC
  Administered 2013-08-26: 0.25 mg via ORAL
  Filled 2013-08-26: qty 1

## 2013-08-26 MED ORDER — DIGOXIN 250 MCG PO TABS
0.2500 mg | ORAL_TABLET | Freq: Once | ORAL | Status: AC
  Administered 2013-08-26: 0.25 mg via ORAL
  Filled 2013-08-26 (×2): qty 1

## 2013-08-26 NOTE — Care Management Note (Signed)
    Page 1 of 1   08/26/2013     4:21:09 PM   CARE MANAGEMENT NOTE 08/26/2013  Patient:  Kendra Hughes, Kendra Hughes   Account Number:  [id number]  Date Initiated:  08/23/2013  Documentation initiated by:  Kendra Hughes  Subjective/Objective Assessment:   adm w at fib     Action/Plan:   lives w fam, pcp dr Kendra Hughes   Anticipated DC Date:  08/27/2013   Anticipated DC Plan:  HOME/SELF CARE      DC Planning Services  CM consult      Choice offered to / List presented to:             Status of service:  In process, will continue to follow Medicare Important Message given?   (If response is "NO", the following Medicare IM given date fields will be blank) Date Medicare IM given:   Date Additional Medicare IM given:    Discharge Disposition:    Per UR Regulation:  Reviewed for med. necessity/level of care/duration of stay  If discussed at Long Length of Stay Meetings, dates discussed:   08/24/2013  08/26/2013    Comments:  08/26/13 Kendra Oloughlin,RN,BSN [phone] INR 3.37 TODAY; PLAN TO OBSERVE PT OVERNIGHT DUE TO SIGNIFICANT INR JUMP.  WILL FOLLOW PROGRESS.

## 2013-08-26 NOTE — Progress Notes (Signed)
ANTICOAGULATION CONSULT NOTE   Pharmacy Consult for Heparin and Warfarin Indication: atrial fibrillation, LA thrombus  No Known Allergies  Patient Measurements: Height: 5' 4.5" (163.8 cm) Weight: 171 lb 4.8 oz (77.701 kg) IBW/kg (Calculated) : 55.85 Heparin Dosing Weight: 72.4 kg  Vital Signs: Temp: 97.6 F (36.4 C) (02/19 0504) Temp src: Oral (02/19 0504) BP: 110/45 mmHg (02/19 0504) Pulse Rate: 79 (02/19 0504)  Labs:  Recent Labs  08/24/13 0237 08/24/13 1135 08/25/13 0427 08/26/13 0335  HGB 11.7*  --  12.2 12.0  HCT 34.9*  --  36.4 35.5*  PLT 263  --  258 242  LABPROT 15.6*  --  20.2* 32.9*  INR 1.27  --  1.78* 3.37*  HEPARINUNFRC 0.27* 0.43 0.47 0.64    Estimated Creatinine Clearance: 39.3 ml/min (by C-G formula based on Cr of 1.01).   Assessment: 80 yo F on heparin bridge to warfarin for AFib. Of note, patient has a h/o PAF and has refused Coumadin in the past but now is agreeable to restarting, was previously on ASA 325 mg. Heparin level is therapeutic at 800 units/hr. Hgb, platelets remain wnl and stable with no reported s/sx bleeding. Coumadin was started on 2/13, INR with quick increase from 1.78 to 3.37.  Spoke to [employer], PA, agreeable with stopping IV heparin.  Goal of Therapy:  INR 2-3 Heparin level 0.3-0.7 units/ml Monitor platelets by anticoagulation protocol: Yes   Plan:  - D/C IV heparin. - No Coumadin tonight.  Recommend resuming tomorrow with 2.5 mg, will need INR checked as outpatient on Friday if able.  If unable to have INR checked tomorrow, I'd recommend Coumadin 2.5 mg daily until INR can be rechecked ASAP. - Monitor daily INR, CBC, any s/s bleeding  Tad Moore, BCPS  Clinical Pharmacist Pager [phone]  08/26/2013 8:47 AM

## 2013-08-26 NOTE — Progress Notes (Signed)
Patient: Kendra Hughes / Admit Date: 08/19/2013 / Date of Encounter: 08/26/2013, 10:47 AM   Subjective  Feels well today. No CP or SOB. Declines home health.  Objective   Telemetry: AF rates 80s-100s. At most pauses of up to 1.9sec  Physical Exam: Blood pressure 110/45, pulse 79, temperature 97.6 F (36.4 C), temperature source Oral, resp. rate 18, height 5' 4.5" (1.638 m), weight 171 lb 4.8 oz (77.701 kg), SpO2 95.00%. General: Well developed, well nourished elderly WF, in no acute distress. Head: Normocephalic, atraumatic, sclera non-icteric, no xanthomas, nares are without discharge. Neck: Negative for carotid bruits. JVP not elevated. Lungs: Clear bilaterally to auscultation without wheezes, rales, or rhonchi. Breathing is unlabored. Heart: Irreg irreg, S1 S2 without murmurs, rubs, or gallops.  Abdomen: Soft, non-tender, non-distended with normoactive bowel sounds. No rebound/guarding. Extremities: No clubbing or cyanosis. No edema. Distal pedal pulses are 2+ and equal bilaterally. Neuro: Alert and oriented X 3. Moves all extremities spontaneously. Psych:  Responds to questions appropriately with a normal affect.   Intake/Output Summary (Last 24 hours) at 08/26/13 1047 Last data filed at 08/26/13 0505  Gross per 24 hour  Intake    600 ml  Output   1350 ml  Net   -750 ml    Inpatient Medications:  . atorvastatin  10 mg Oral q1800  . diltiazem  120 mg Oral Daily  . folic acid  1 mg Oral Daily  . furosemide  40 mg Oral Daily  . metoprolol tartrate  75 mg Oral BID  . Warfarin - Pharmacist Dosing Inpatient   Does not apply q1800   Infusions:  . sodium chloride Stopped (08/26/13 0857)    Labs:  Recent Labs  08/25/13 0427 08/26/13 0335  WBC 6.2 6.9  HGB 12.2 12.0  HCT 36.4 35.5*  MCV 90.5 89.9  PLT 258 242     Radiology/Studies:  Dg Chest Portable 1 View 08/19/2013   CLINICAL DATA:  Atrial fibrillation, shortness of breath  EXAM: PORTABLE CHEST - 1 VIEW   COMPARISON:  NM MYOCAR MULTI w/SPECT w/WALL MOTION / EF dated 10/09/2009; DG CHEST 1V PORT dated 10/09/2009  FINDINGS: The cardiac silhouette is enlarged. Increased density projects within the right lung base within meniscal apex. There is diffuse prominence of the interstitial markings with greatest confluence in the lung bases. The osseous structures demonstrate no acute abnormalities. Atherosclerotic calcifications identified within the aorta.  IMPRESSION: Findings likely reflecting a pleural effusion, small, right lung base. Atelectasis versus infiltrate within the lung bases right greater than left. Chronic bronchitic changes are also identified.   Electronically Signed   By: Salome Holmes M.D.   On: 08/19/2013 16:23     Assessment and Plan  1. Paroxysmal atrial fibrillation 2. LAA thrombus by TEE 08/23/13 3. HTN with hypotension this admission 4. Acute diastolic CHF due to atrial fibrillation 5. Mild AI, MR, mod TR by TEE 08/23/13 6. Abnormal UA   Rates 80s-100s today. Could consider addition of digoxin since BP does not allow for med titration, although would have to be cautious in dosing given advanced age and subsequent decreased CrCl. INR therapeutic. We may be able to back down on Lasix since CHF was partially due to rapid rates. DC today if OK with MD, planning for reevaluation as outpatient for elective cardioversion after 3-4 weeks of full anticoagulation. F/u PCP for UA recheck - contaminated specimen, denies sx. See pharmacy note for Coumadin recs.  Signed, Ronie Spies PA-C  I have  seen and examined the patient along with Ronie Spies PA-C.  I have reviewed the chart, notes and new data.  I agree with PA's note.  Despite reasonable rate control overnight, this morning he heart rate is 135 at rest. Few option for rate control due to BP and presence of LA thrombus. Digoxin may be our only good option, to be used cautiously due to renal dysfunction.  INR has rapidly jumped up as well, now  supratherapeutic and could well be >4 by tomorrow. All in all it is probably safer to keep her another day.  Thurmon Fair, MD, [facility] [phone] 08/26/2013, 11:24 AM

## 2013-08-27 DIAGNOSIS — I513 Intracardiac thrombosis, not elsewhere classified: Secondary | ICD-10-CM

## 2013-08-27 LAB — PROTIME-INR
INR: 3.14 — ABNORMAL HIGH (ref 0.00–1.49)
Prothrombin Time: 31.1 seconds — ABNORMAL HIGH (ref 11.6–15.2)

## 2013-08-27 LAB — CBC
HCT: 32.8 % — ABNORMAL LOW (ref 36.0–46.0)
Hemoglobin: 11 g/dL — ABNORMAL LOW (ref 12.0–15.0)
MCH: 29.7 pg (ref 26.0–34.0)
MCHC: 33.5 g/dL (ref 30.0–36.0)
MCV: 88.6 fL (ref 78.0–100.0)
Platelets: 218 10*3/uL (ref 150–400)
RBC: 3.7 MIL/uL — ABNORMAL LOW (ref 3.87–5.11)
RDW: 14.9 % (ref 11.5–15.5)
WBC: 6.6 10*3/uL (ref 4.0–10.5)

## 2013-08-27 MED ORDER — WARFARIN SODIUM 2.5 MG PO TABS: 2.5000 mg | ORAL_TABLET | Freq: Every day | ORAL | Status: DC

## 2013-08-27 MED ORDER — FUROSEMIDE 40 MG PO TABS: 40.0000 mg | ORAL_TABLET | Freq: Every day | ORAL | Status: DC

## 2013-08-27 MED ORDER — DIGOXIN 125 MCG PO TABS: 0.1250 mg | ORAL_TABLET | Freq: Every day | ORAL | Status: DC

## 2013-08-27 MED ORDER — METOPROLOL TARTRATE 50 MG PO TABS: 75.0000 mg | ORAL_TABLET | Freq: Two times a day (BID) | ORAL | Status: DC

## 2013-08-27 MED ORDER — DILTIAZEM HCL ER COATED BEADS 120 MG PO CP24: 120.0000 mg | ORAL_CAPSULE | Freq: Every day | ORAL | Status: DC

## 2013-08-27 MED ORDER — WARFARIN SODIUM 2.5 MG PO TABS
2.5000 mg | ORAL_TABLET | Freq: Every day | ORAL | Status: DC
Start: 2013-08-27 — End: 2013-08-27

## 2013-08-27 MED ORDER — DIGOXIN 125 MCG PO TABS
0.1250 mg | ORAL_TABLET | Freq: Every day | ORAL | Status: DC
Start: 2013-08-27 — End: 2013-08-27

## 2013-08-27 NOTE — Discharge Summary (Signed)
Discharge Summary   Patient ID: Kendra Hughes,  MRN: [id number], DOB/AGE: 11/30/1922 80 y.o.  Admit date: 08/19/2013 Discharge date: 08/27/2013  Primary Care Provider: DAVIS,JAMES W Primary Cardiologist: C. Hilty, MD   Discharge Diagnoses Principal Problem:   PAF (paroxysmal atrial fibrillation), with RVR  **Coumadin initiated this admission.  **Unable to perform cardioversion secondary to finding of left atrial appendage thrombus.  Active Problems:   Thrombus of left atrial appendage   HTN (hypertension)   Acute diastolic heart failure  **Net negative of 4.9 liters with reduction in weight from 175 lbs to 168 lbs, this admission.   Hypotension   Mild AI, MR, mod TR by TEE 08/23/13  Allergies No Known Allergies  Procedures  2D Echocardiogram 2.13.2015  Study Conclusions  - Left ventricle: The cavity size was normal. Wall thickness   was normal. Systolic function was normal. The estimated   ejection fraction was in the range of 55% to 60%. Wall   motion was normal; there were no regional wall motion   abnormalities. Atrial fibrillation limits evaluation of LV   diastolic function. E/e' = 10, indeterminate mean LA   pressure - Aortic valve: Trivial regurgitation. _____________   Transesophageal Echocardiogram 2.16.2015  Study Conclusions  - Left ventricle: The cavity size was normal. Wall thickness   was normal. Systolic function was normal. The estimated   ejection fraction was in the range of 50% to 55%. - Aortic valve: No evidence of vegetation. Mild   regurgitation. - Mitral valve: Mild regurgitation. - Left atrium: The atrium was mildly dilated. Acoustic   contrast opacification revealed a large, 89mm (L) x 58mm   (W), loosely organizedthrombusin the appendage. Emptying   velocity was reduced. - Right atrium: The atrium was dilated. - Atrial septum: No defect or patent foramen ovale was   identified. Echo contrast study showed no right-to-left   atrial  level shunt, following an increase in RA pressure   induced by provocative maneuvers. - Tricuspid valve: Moderate regurgitation directed   centrally. - Pulmonic valve: No evidence of vegetation. _____________   History of Present Illness  80 year old female with prior history of paroxysmal atrial fibrillation who has previously refused Coumadin anticoagulation and instead has been maintained on aspirin therapy. Over a two-week period prior to admission, she noted progressive dyspnea. She was seen in cardiology clinic on February 12, and was found to be in atrial fibrillation with a rapid ventricular response with rates in the 170s. She was also relatively hypotensive with blood pressures in the low 100s. She was breathless with evidence of volume overload, and decision was made to admit her for further evaluation and management.  Hospital Course  Following admission, she was placed on IV diltiazem which was titrated up to 10 mg per hour with variable rate control. As rates remained elevated, beta blocker therapy was also added. She was anticoagulated with IV heparin and after further discussion with the patient, she was willing to initiate warfarin therapy as well. As she had evidence of volume overload, oral Lasix therapy was initiated and with diuresis, her weight has been reduced from 175 pounds on admission to 168 pounds at discharge. She had a net negative diuresis of 4.9 L during this admission.  As rates remained elevated despite calcium channel and beta blockade, decision was made to pursue transesophageal echocardiogram and cardioversion. Transesophageal echocardiogram was performed on February 16, revealing a large 8 x 18 mm loosely organized thrombus in the left atrial appendage with normal LV function.  In the setting of left atrial appendage thrombus, cardioversion could not be performed. Patient was maintained on Coumadin with heparin bridging and would remain hospitalized until her INR  became therapeutic. Oral digoxin therapy was added to her regimen for additional rate control and she was able to be transitioned to oral diltiazem therapy as well.  Her INR jumped significantly from February 18 to February 19, rising to 3.37 on February 19. Coumadin was held on the 19th and INR this morning was 3.14. We plan to discharge her home on 2.5 mg daily to be started February 21. She will have INR followup in her primary care provider's office on Monday, February 23, and this result will be faxed to our office here in [facility] to be managed by our Coumadin clinic. Kendra Hughes will be discharged home today in good condition and arrangements have been made for followup with Dr. Rennis Golden on March 20, for reconsideration of cardioversion.  Discharge Vitals Blood pressure 100/56, pulse 75, temperature 97.8 F (36.6 C), temperature source Oral, resp. rate 18, height 5' 4.5" (1.638 m), weight 168 lb 6.9 oz (76.4 kg), SpO2 95.00%.  Filed Weights   08/25/13 0414 08/26/13 0146 08/27/13 0542  Weight: 169 lb 15.6 oz (77.1 kg) 171 lb 4.8 oz (77.701 kg) 168 lb 6.9 oz (76.4 kg)    Labs  CBC  Recent Labs  08/26/13 0335 08/27/13 0300  WBC 6.9 6.6  HGB 12.0 11.0*  HCT 35.5* 32.8*  MCV 89.9 88.6  PLT 242 218   Basic Metabolic Panel Lab Results  Component Value Date   CREATININE 1.01 08/21/2013   BUN 23 08/21/2013   NA 134* 08/21/2013   K 4.4 08/21/2013   CL 95* 08/21/2013   CO2 29 08/21/2013    Liver Function Tests Lab Results  Component Value Date   ALT 33 08/20/2013   AST 35 08/20/2013   ALKPHOS 77 08/20/2013   BILITOT 0.9 08/20/2013   Cardiac Enzymes Lab Results  Component Value Date   TROPONINI <0.30 08/20/2013   Thyroid Function Tests Lab Results  Component Value Date   TSH 2.258 08/19/2013    Disposition  Pt is being discharged home today in good condition.  Follow-up Plans & Appointments  Follow-up Information   Follow up with Chrystie Nose, MD On 09/24/2013. (8:45 AM)      Specialty:  Cardiology   Contact information:   [address] [place] [address] [phone]       Follow up with Carmin Richmond, MD On 08/30/2013. (Follow up INR (coumadin level) and have result faxed to [facility] @ [phone])    Specialty:  Family Medicine   Contact information:   [address] [place] [address] [phone]      Discharge Medications    Medication List    STOP taking these medications       aspirin 325 MG tablet     carvedilol 3.125 MG tablet  Commonly known as:  COREG      TAKE these medications       ALLEGRA PO  Take 1 tablet by mouth as needed (allergies).     atorvastatin 10 MG tablet  Commonly known as:  LIPITOR  Take 10 mg by mouth daily.     digoxin 0.125 MG tablet  Commonly known as:  LANOXIN  Take 1 tablet (0.125 mg total) by mouth daily.     diltiazem 120 MG 24 hr capsule  Commonly known as:  CARDIZEM CD  Take 1 capsule (120 mg total) by mouth daily.     FLONASE NA  Place 1 spray into the nose as needed (allergies).     folic acid 800 MCG tablet  Commonly known as:  FOLVITE  Take 800 mcg by mouth daily.     furosemide 40 MG tablet  Commonly known as:  LASIX  Take 1 tablet (40 mg total) by mouth daily.     metoprolol 50 MG tablet  Commonly known as:  LOPRESSOR  Take 1.5 tablets (75 mg total) by mouth 2 (two) times daily.     VITAMIN D PO  Take 1,000 mg by mouth daily.     warfarin 2.5 MG tablet - to start 08/28/2013  Commonly known as:  COUMADIN  Take 1 tablet (2.5 mg total) by mouth daily.       Outstanding Labs/Studies  INR on 2/23.  To be performed @ PCP's office and faxed to our office.  Duration of Discharge Encounter   Greater than 30 minutes including physician time.  Signed, Nicolasa Ducking NP 08/27/2013, 9:01 AM

## 2013-08-27 NOTE — Discharge Instructions (Signed)
***  PLEASE REMEMBER TO BRING ALL OF YOUR MEDICATIONS TO EACH OF YOUR FOLLOW-UP OFFICE VISITS.  

## 2013-08-27 NOTE — Progress Notes (Signed)
  Patient Name: Kendra Hughes Date of Encounter: 08/27/2013  Principal Problem:   PAF (paroxysmal atrial fibrillation), with RVR Active Problems:   HTN (hypertension)   Acute diastolic heart failure   Hypotension   Mild AI, MR, mod TR by TEE 08/23/13   Length of Stay: 8  SUBJECTIVE  Feels well. VR much better control. INR did not jump up as feared.  CURRENT MEDS . atorvastatin  10 mg Oral q1800  . digoxin  0.125 mg Oral Daily  . diltiazem  120 mg Oral Daily  . folic acid  1 mg Oral Daily  . furosemide  40 mg Oral Daily  . metoprolol tartrate  75 mg Oral BID  . Warfarin - Pharmacist Dosing Inpatient   Does not apply q1800    OBJECTIVE   Intake/Output Summary (Last 24 hours) at 08/27/13 0800 Last data filed at 08/26/13 2212  Gross per 24 hour  Intake    360 ml  Output    550 ml  Net   -190 ml   Filed Weights   08/25/13 0414 08/26/13 0146 08/27/13 0542  Weight: 77.1 kg (169 lb 15.6 oz) 77.701 kg (171 lb 4.8 oz) 76.4 kg (168 lb 6.9 oz)    PHYSICAL EXAM Filed Vitals:   08/26/13 0504 08/26/13 1326 08/26/13 2208 08/27/13 0542  BP: 110/45 120/72 118/77 100/56  Pulse: 79 104 74 75  Temp: 97.6 F (36.4 C) 97.8 F (36.6 C) 97.5 F (36.4 C) 97.8 F (36.6 C)  TempSrc: Oral Oral Oral Oral  Resp: 18 18 20 18   Height:      Weight:    76.4 kg (168 lb 6.9 oz)  SpO2: 95% 100% 95% 95%   General: Alert, oriented x3, no distress Head: no evidence of trauma, PERRL, EOMI, no exophtalmos or lid lag, no myxedema, no xanthelasma; normal ears, nose and oropharynx Neck: normal jugular venous pulsations and no hepatojugular reflux; brisk carotid pulses without delay and no carotid bruits Chest: clear to auscultation, no signs of consolidation by percussion or palpation, normal fremitus, symmetrical and full respiratory excursions Cardiovascular: normal position and quality of the apical impulse, irregular rhythm, normal first and second heart sounds, no rubs or gallops, no  murmur Abdomen: no tenderness or distention, no masses by palpation, no abnormal pulsatility or arterial bruits, normal bowel sounds, no hepatosplenomegaly Extremities: no clubbing, cyanosis or edema; 2+ radial, ulnar and brachial pulses bilaterally; 2+ right femoral, posterior tibial and dorsalis pedis pulses; 2+ left femoral, posterior tibial and dorsalis pedis pulses; no subclavian or femoral bruits Neurological: grossly nonfocal  LABS  CBC  Recent Labs  08/26/13 0335 08/27/13 0300  WBC 6.9 6.6  HGB 12.0 11.0*  HCT 35.5* 32.8*  MCV 89.9 88.6  PLT 242 218   INR 3.1  TELE AF with rates 80-100  ASSESSMENT AND PLAN DC home. Recheck INR on Monday and send results to Coumadin Clinic, [place], Kristin Alvstad RPh. F/U with Dr. Thursday in 2-3 weeks to discuss repeat TEE cardioversion. Metoprolol 75 mg BID, Digoxin 0.125 mg daily, diltiazem CD 120 mg daily for rate control.    Rennis Golden, MD, [facility] [phone] office [phone] pager 08/27/2013 8:00 AM

## 2013-08-27 NOTE — Progress Notes (Signed)
ANTICOAGULATION CONSULT NOTE   Pharmacy Consult for Warfarin Indication: atrial fibrillation, LA thrombus  No Known Allergies  Patient Measurements: Height: 5' 4.5" (163.8 cm) Weight: 168 lb 6.9 oz (76.4 kg) IBW/kg (Calculated) : 55.85 Heparin Dosing Weight: 72.4 kg  Vital Signs: Temp: 97.8 F (36.6 C) (02/20 0542) Temp src: Oral (02/20 0542) BP: 100/56 mmHg (02/20 0542) Pulse Rate: 75 (02/20 0542)  Labs:  Recent Labs  08/24/13 1135  08/25/13 0427 08/26/13 0335 08/27/13 0300  HGB  --   < > 12.2 12.0 11.0*  HCT  --   --  36.4 35.5* 32.8*  PLT  --   --  258 242 218  LABPROT  --   --  20.2* 32.9* 31.1*  INR  --   --  1.78* 3.37* 3.14*  HEPARINUNFRC 0.43  --  0.47 0.64  --   < > = values in this interval not displayed.  Estimated Creatinine Clearance: 39 ml/min (by C-G formula based on Cr of 1.01).   Assessment: 80 yo F on heparin bridge to warfarin for AFib. Of note, patient has a h/o PAF and has refused Coumadin in the past but now is agreeable to restarting, was previously on ASA 325 mg.  Hgb, platelets remain wnl and stable with no reported s/sx bleeding. Coumadin was started on 2/13, INR with quick increase from 1.78 to 3.37. Today's INR back down to 3.14.  Goal of Therapy:  INR 2-3 Heparin level 0.3-0.7 units/ml Monitor platelets by anticoagulation protocol: Yes   Plan:   - No Coumadin tonight.  Recommend resuming tomorrow with 2.5 mg daily until INR can be rechecked on Monday. - Monitor daily INR, CBC, any s/s bleeding  Tad Moore, BCPS  Clinical Pharmacist Pager [phone]  08/27/2013 8:43 AM

## 2013-08-31 ENCOUNTER — Ambulatory Visit: Payer: PRIVATE HEALTH INSURANCE | Admitting: Pharmacist Clinician (PhC)/ Clinical Pharmacy Specialist

## 2013-09-10 ENCOUNTER — Telehealth: Payer: Self-pay | Admitting: Pharmacist Clinician (PhC)/ Clinical Pharmacy Specialist

## 2013-09-10 NOTE — Telephone Encounter (Signed)
Called Dr. Jinny Sanders office, [facility] to determine if they are following INR or if pt has been in for INR.

## 2013-09-10 NOTE — Telephone Encounter (Signed)
Message copied by Rosalee Kaufman on Fri Sep 10, 2013  1:46 PM ------      Message from: Ok Anis      Created: Fri Aug 27, 2013  8:43 AM       Hi Belenda Cruise,            Kendra Hughes was started on coumadin this admission for afib with subsequent finding of LA thrombus.  She is going home today, 2/20, on 2.5mg  of coumadin daily (INR 3.14 today, down from 3.37 on 2/19 - coumadin held 2/19).  She lives in [place] and her PCP, Dr. Lois Huxley will be checking her INR on Monday and faxing to you.            Just wanted to give you a heads up.            Thanks,            Thayer Ohm ------

## 2013-09-13 NOTE — Telephone Encounter (Signed)
Dr. Earlene Plater' office confirmed that they are monitoring INRS

## 2013-09-24 ENCOUNTER — Encounter: Payer: Self-pay | Admitting: Internal Medicine

## 2013-09-24 ENCOUNTER — Ambulatory Visit (INDEPENDENT_AMBULATORY_CARE_PROVIDER_SITE_OTHER): Payer: Medicare Other | Admitting: Internal Medicine

## 2013-09-24 VITALS — BP 112/76 | HR 72 | Ht 65.0 in | Wt 166.3 lb

## 2013-09-24 DIAGNOSIS — I5031 Acute diastolic (congestive) heart failure: Secondary | ICD-10-CM

## 2013-09-24 DIAGNOSIS — I509 Heart failure, unspecified: Secondary | ICD-10-CM

## 2013-09-24 DIAGNOSIS — R5383 Other fatigue: Secondary | ICD-10-CM

## 2013-09-24 DIAGNOSIS — I38 Endocarditis, valve unspecified: Secondary | ICD-10-CM

## 2013-09-24 DIAGNOSIS — I5189 Other ill-defined heart diseases: Secondary | ICD-10-CM

## 2013-09-24 DIAGNOSIS — Z01818 Encounter for other preprocedural examination: Secondary | ICD-10-CM

## 2013-09-24 DIAGNOSIS — I4891 Unspecified atrial fibrillation: Secondary | ICD-10-CM

## 2013-09-24 DIAGNOSIS — I513 Intracardiac thrombosis, not elsewhere classified: Secondary | ICD-10-CM

## 2013-09-24 DIAGNOSIS — R5381 Other malaise: Secondary | ICD-10-CM

## 2013-09-24 DIAGNOSIS — D689 Coagulation defect, unspecified: Secondary | ICD-10-CM

## 2013-09-24 DIAGNOSIS — I480 Paroxysmal atrial fibrillation: Secondary | ICD-10-CM

## 2013-09-24 NOTE — Patient Instructions (Addendum)
Your physician has requested that you have a TEE. During a TEE, sound waves are used to create images of your heart. It provides your doctor with information about the size and shape of your heart and how well your heart's chambers and valves are working. In this test, a transducer is attached to the end of a flexible tube that's guided down your throat and into your esophagus (the tube leading from you mouth to your stomach) to get a more detailed image of your heart. You are not awake for the procedure. Please see the instruction sheet given to you today. For further information please visit [email or web address]  During this, Dr. Rennis Golden will attempt to do a cardioversion (shock your heart into a normal rhythm)  You will need to have blood work 3-5 days prior to this procedure.   Please schedule this with Dr. Rennis Golden April 6th or 15th if possible.

## 2013-09-24 NOTE — Progress Notes (Signed)
OFFICE NOTE  Chief Complaint:  "I can't breathe"  Primary Care Physician: Carmin Richmond, MD  HPI:  MIHIKA SURRETTE is an 80 year old female who I've been following for some time with a history of paroxysmal atrial fibrillation. Despite her CHADS2 score of 2, she has refused warfarin anticoagulation in the past, opting for aspirin which are documented extensively he is insufficient to protect her from stroke. She had worsening shortness of breath and apparently she and her husband tried to call the office to schedule an appointment with me but could not get an appointment.  When I saw her in the office a few months ago, she was notably breathless and can hardly finish sentences. An EKG demonstrated atrial fibrillation with rapid ventricular response at a rate of 173. She did appear somewhat dizzy and her blood pressure was low around 100 systolic.  I admitted her to the hospital and she was an inpatient for 1 week.  She was diuresed and had better rate control of her A. fib. She did undergo a TEE, which demonstrated a left atrial appendage thrombus. Based on that she could not be cardioverted and is recommended that she remain on warfarin with therapeutic INRs for at least one month.  Discharge weight was 168 and she is 166 pounds today.  She does report some mild edema which is dependent in the right leg but none in the left but does improve with elevation at night.  PMHx:  Past Medical History  Diagnosis Date  . PAF (paroxysmal atrial fibrillation)   . Hypertension   . Dyslipidemia   . DOE (dyspnea on exertion)   . History of nuclear stress test 10/2009    normal exam    Past Surgical History  Procedure Laterality Date  . Abdominal hysterectomy    . Biopsy breast      right  . Transthoracic echocardiogram  07/22/2012    EF 55-60%; mild AV regurg; mild MR; mild-mod TR    FAMHx:  No family history on file.  SOCHx:   reports that she has never smoked. She has never used smokeless  tobacco. She reports that she does not drink alcohol or use illicit drugs.  ALLERGIES:  No Known Allergies  ROS: A comprehensive review of systems was negative except for: Cardiovascular: positive for lower extremity edema  HOME MEDS: Current Outpatient Prescriptions  Medication Sig Dispense Refill  . atorvastatin (LIPITOR) 10 MG tablet Take 10 mg by mouth daily.      . Cholecalciferol (VITAMIN D PO) Take 1,000 mg by mouth daily.       . digoxin (LANOXIN) 0.125 MG tablet Take 1 tablet (0.125 mg total) by mouth daily.  30 tablet  3  . diltiazem (CARDIZEM CD) 120 MG 24 hr capsule Take 1 capsule (120 mg total) by mouth daily.  30 capsule  3  . Fexofenadine HCl (ALLEGRA PO) Take 1 tablet by mouth as needed (allergies).       . Fluticasone Propionate (FLONASE NA) Place 1 spray into the nose as needed (allergies).       . folic acid (FOLVITE) 800 MCG tablet Take 800 mcg by mouth daily.      . furosemide (LASIX) 40 MG tablet Take 1 tablet (40 mg total) by mouth daily.  30 tablet  3  . metoprolol (LOPRESSOR) 50 MG tablet Take 1.5 tablets (75 mg total) by mouth 2 (two) times daily.  90 tablet  3  . warfarin (COUMADIN) 2.5 MG tablet Take  1 tablet (2.5 mg total) by mouth daily.  30 tablet  1   No current facility-administered medications for this visit.    LABS/IMAGING: No results found for this or any previous visit (from the past 48 hour(s)). No results found.  VITALS: BP 112/76  Pulse 72  Ht 5\' 5"  (1.651 m)  Wt 166 lb 4.8 oz (75.433 kg)  BMI 27.67 kg/m2  EXAM: General appearance: alert and no distress Neck: no carotid bruit and no JVD Lungs: clear to auscultation bilaterally Heart: irregularly irregular rhythm Abdomen: soft, non-tender; bowel sounds normal; no masses,  no organomegaly Extremities: trace RLE edema Pulses: 2+ and symmetric . Skin: Skin color, texture, turgor normal. No rashes or lesions Neurologic: Grossly normal Psych: Normal  EKG: Atrial fibrillation with  controlled ventricular response at 72  ASSESSMENT: 1. Recent acute diastolic HF, secondary to AF 2. Atrial fibrillation with rapid ventricular response - now rate controlled 3. LAA thrombus 4. Mild AI, mild to moderate AR, EF 50-55%  PLAN: 1.   Mrs. Karger has improved significantly with rate control and diuresis. Her weight is now 2 pounds less than discharge. She remained in A. fib but is rate controlled. She did have a left atrial appendage thrombus which was large and mobile and high risk for embolization. She's now been on warfarin with INR is generally between 2 and 4 according to her report. She did have one low INR of 1.7 around the time of discharge. I would like her to have at least another couple of weeks of anticoagulation with therapeutic INRs prior to scheduling a repeat TEE/cardioversion attempt. She is agreeable to this some time after Easter. I believe her diuretic doses are appropriate now and her A. fib is rate controlled. Her blood pressure is improved.  Plan followup after her TEE cardioversion.  09-08-1992, MD, [facility] Attending Cardiologist [facility]  HILTY,Kenneth C 09/24/2013, 9:15 AM

## 2013-10-19 ENCOUNTER — Other Ambulatory Visit: Payer: Self-pay | Admitting: *Deleted

## 2013-10-19 DIAGNOSIS — Z01818 Encounter for other preprocedural examination: Secondary | ICD-10-CM

## 2013-10-27 LAB — PROTIME-INR

## 2013-11-01 ENCOUNTER — Encounter (HOSPITAL_COMMUNITY): Payer: Medicare Other | Admitting: Certified Registered Nurse Anesthetist

## 2013-11-01 ENCOUNTER — Encounter (HOSPITAL_COMMUNITY)
Admission: RE | Disposition: A | Discharge status: Home / Self Care | Payer: Self-pay | Source: Ambulatory Visit | Attending: Internal Medicine

## 2013-11-01 ENCOUNTER — Ambulatory Visit (HOSPITAL_COMMUNITY): Payer: Medicare Other | Admitting: Certified Registered Nurse Anesthetist

## 2013-11-01 ENCOUNTER — Encounter (HOSPITAL_COMMUNITY): Payer: Self-pay

## 2013-11-01 ENCOUNTER — Ambulatory Visit (HOSPITAL_COMMUNITY)
Admission: RE | Admit: 2013-11-01 | Discharge: 2013-11-01 | Disposition: A | Discharge status: Home / Self Care | Payer: Medicare Other | Source: Ambulatory Visit | Attending: Internal Medicine | Admitting: Internal Medicine

## 2013-11-01 DIAGNOSIS — I059 Rheumatic mitral valve disease, unspecified: Secondary | ICD-10-CM | POA: Insufficient documentation

## 2013-11-01 DIAGNOSIS — Z01818 Encounter for other preprocedural examination: Secondary | ICD-10-CM

## 2013-11-01 DIAGNOSIS — I480 Paroxysmal atrial fibrillation: Secondary | ICD-10-CM

## 2013-11-01 DIAGNOSIS — I4891 Unspecified atrial fibrillation: Secondary | ICD-10-CM | POA: Insufficient documentation

## 2013-11-01 DIAGNOSIS — I10 Essential (primary) hypertension: Secondary | ICD-10-CM | POA: Insufficient documentation

## 2013-11-01 DIAGNOSIS — I509 Heart failure, unspecified: Secondary | ICD-10-CM | POA: Insufficient documentation

## 2013-11-01 DIAGNOSIS — I5031 Acute diastolic (congestive) heart failure: Secondary | ICD-10-CM

## 2013-11-01 HISTORY — PX: CARDIOVERSION: SHX1299

## 2013-11-01 HISTORY — PX: TEE WITHOUT CARDIOVERSION: SHX5443

## 2013-11-01 LAB — PROTIME-INR
INR: 1.72 — ABNORMAL HIGH (ref 0.00–1.49)
Prothrombin Time: 19.7 seconds — ABNORMAL HIGH (ref 11.6–15.2)

## 2013-11-01 SURGERY — ECHOCARDIOGRAM, TRANSESOPHAGEAL
Anesthesia: General

## 2013-11-01 MED ORDER — PROPOFOL 10 MG/ML IV BOLUS
INTRAVENOUS | Status: AC
  Filled 2013-11-01: qty 20

## 2013-11-01 MED ORDER — BUTAMBEN-TETRACAINE-BENZOCAINE 2-2-14 % EX AERO
INHALATION_SPRAY | CUTANEOUS | Status: DC | PRN
  Administered 2013-11-01: 2 via TOPICAL

## 2013-11-01 MED ORDER — MIDAZOLAM HCL 5 MG/ML IJ SOLN
INTRAMUSCULAR | Status: AC
  Filled 2013-11-01: qty 2

## 2013-11-01 MED ORDER — FENTANYL CITRATE 0.05 MG/ML IJ SOLN
INTRAMUSCULAR | Status: DC | PRN
  Administered 2013-11-01 (×2): 12.5 ug via INTRAVENOUS

## 2013-11-01 MED ORDER — FENTANYL CITRATE 0.05 MG/ML IJ SOLN
INTRAMUSCULAR | Status: AC
  Filled 2013-11-01: qty 2

## 2013-11-01 MED ORDER — SODIUM CHLORIDE 0.9 % IV SOLN
INTRAVENOUS | Status: DC | PRN
  Administered 2013-11-01 (×2): via INTRAVENOUS

## 2013-11-01 MED ORDER — PROPOFOL 10 MG/ML IV BOLUS
INTRAVENOUS | Status: DC | PRN
  Administered 2013-11-01: 50 mg via INTRAVENOUS

## 2013-11-01 MED ORDER — DIPHENHYDRAMINE HCL 50 MG/ML IJ SOLN
INTRAMUSCULAR | Status: AC
  Filled 2013-11-01: qty 1

## 2013-11-01 MED ORDER — WARFARIN SODIUM 2.5 MG PO TABS
2.5000 mg | ORAL_TABLET | Freq: Every day | ORAL | Status: DC
Start: 2013-11-01 — End: 2013-11-01

## 2013-11-01 MED ORDER — METOPROLOL TARTRATE 50 MG PO TABS: 50.0000 mg | ORAL_TABLET | Freq: Two times a day (BID) | ORAL | Status: DC

## 2013-11-01 MED ORDER — SODIUM CHLORIDE 0.9 % IV SOLN
INTRAVENOUS | Status: DC
  Administered 2013-11-01: 11:00:00 via INTRAVENOUS

## 2013-11-01 MED ORDER — WARFARIN SODIUM 2.5 MG PO TABS: 2.5000 mg | ORAL_TABLET | Freq: Every day | ORAL | Status: DC

## 2013-11-01 MED ORDER — LIDOCAINE VISCOUS 2 % MT SOLN
OROMUCOSAL | Status: DC | PRN
  Administered 2013-11-01: 10 mL via OROMUCOSAL

## 2013-11-01 MED ORDER — LIDOCAINE HCL (CARDIAC) 20 MG/ML IV SOLN
INTRAVENOUS | Status: AC
  Filled 2013-11-01: qty 5

## 2013-11-01 MED ORDER — LIDOCAINE VISCOUS 2 % MT SOLN
OROMUCOSAL | Status: AC
  Filled 2013-11-01: qty 15

## 2013-11-01 MED ORDER — LIDOCAINE HCL (CARDIAC) 20 MG/ML IV SOLN
INTRAVENOUS | Status: DC | PRN
  Administered 2013-11-01: 60 mg via INTRAVENOUS

## 2013-11-01 MED ORDER — MIDAZOLAM HCL 10 MG/2ML IJ SOLN
INTRAMUSCULAR | Status: DC | PRN
  Administered 2013-11-01 (×2): 1 mg via INTRAVENOUS

## 2013-11-01 MED ORDER — WARFARIN SODIUM 2.5 MG PO TABS
2.5000 mg | ORAL_TABLET | Freq: Every day | ORAL | Status: DC
Start: 2013-11-01 — End: 2014-02-14

## 2013-11-01 NOTE — Anesthesia Postprocedure Evaluation (Signed)
  Anesthesia Post-op Note  Patient: Kendra Hughes  Procedure(s) Performed: Procedure(s): TRANSESOPHAGEAL ECHOCARDIOGRAM (TEE) (N/A) CARDIOVERSION (N/A)  Patient Location: Endoscopy Unit  Anesthesia Type:General  Level of Consciousness: awake, alert , oriented and patient cooperative  Airway and Oxygen Therapy: Patient Spontanous Breathing and Patient connected to nasal cannula oxygen  Post-op Pain: none  Post-op Assessment: Post-op Vital signs reviewed, Patient's Cardiovascular Status Stable, Respiratory Function Stable, Patent Airway and No signs of Nausea or vomiting  Post-op Vital Signs: Reviewed and stable  Last Vitals:  Filed Vitals:   11/01/13 1245  BP:   Pulse: 68  Temp:   Resp: 11    Complications: No apparent anesthesia complications

## 2013-11-01 NOTE — CV Procedure (Signed)
TEE/CARDIOVERSION NOTE  TRANSESOPHAGEAL ECHOCARDIOGRAM (TEE):  Indictation: Atrial Fibrillation  Consent:   Informed consent was obtained prior to the procedure. The risks, benefits and alternatives for the procedure were discussed and the patient comprehended these risks.  Risks include, but are not limited to, cough, sore throat, vomiting, nausea, somnolence, esophageal and stomach trauma or perforation, bleeding, low blood pressure, aspiration, pneumonia, infection, trauma to the teeth and death.    Time Out: Verified patient identification, verified procedure, site/side was marked, verified correct patient position, special equipment/implants available, medications/allergies/relevent history reviewed, required imaging and test results available. Performed  Procedure:  After a procedural time-out, the patient was given 2 mg versed and 25 mcg fentanyl for moderate sedation.  The oropharynx was anesthetized 10 cc of topical 1% viscous lidocaine and 1 cetacaine spray.  The transesophageal probe was inserted in the esophagus and stomach without difficulty and multiple views were obtained.  The patient was kept under observation until the patient left the procedure room.  The patient left the procedure room in stable condition.   Agitated microbubble saline contrast was not administered.  Complications:    Complications: None Patient did tolerate procedure well.  Findings:  1. LEFT VENTRICLE: The left ventricular wall thickness is mildy increased.  The left ventricular cavity is normal in size. Wall motion is normal.  LVEF is ~50%.  2. RIGHT VENTRICLE:  The right ventricle is normal in structure and function without any thrombus or masses.    3. LEFT ATRIUM:  The left atrium is dilated in size without any thrombus or masses.  There is not spontaneous echo contrast ("smoke") in the left atrium consistent with a low flow state.  4. LEFT ATRIAL APPENDAGE:  The left atrial appendage  is free of any thrombus or masses. The appendage has single lobes. Pulse doppler indicates low flow in the appendage.  5. ATRIAL SEPTUM:  The atrial septum appears intact and is free of thrombus and/or masses.  There is no evidence for interatrial shunting by color doppler and saline microbubble.  6. RIGHT ATRIUM:  The right atrium is normal in size and function without any thrombus or masses.  7. MITRAL VALVE:  The mitral valve is normal in structure and function with Mild regurgitation.  There were no vegetations or stenosis.  8. AORTIC VALVE:  The aortic valve is normal in structure and function with no regurgitation.  There were no vegetations or stenosis  9. TRICUSPID VALVE:  The tricuspid valve is normal in structure and function with Mild regurgitation.  There were no vegetations or stenosis  10.  PULMONIC VALVE:  The pulmonic valve is normal in structure and function with no regurgitation.  There were no vegetations or stenosis.   11. AORTIC ARCH, ASCENDING AND DESCENDING AORTA:  There was grade 1 Myrtis Ser et. Al, 1992) atherosclerosis of the ascending aorta, aortic arch, or proximal descending aorta.  CARDIOVERSION:     Second Time Out: Verified patient identification, verified procedure, site/side was marked, verified correct patient position, special equipment/implants available, medications/allergies/relevent history reviewed, required imaging and test results available.  Performed  Procedure:  1. Patient placed on cardiac monitor, pulse oximetry, supplemental oxygen as necessary.  2. Sedation administered per anesthesia 3. Pacer pads placed anterior and posterior chest. 4. Cardioverted 1 time(s).  5. Cardioverted at 150J biphasic.  Complications:  Complications: None Patient did tolerate procedure well.  Impression:  1. No LAA thrombus 2. LVEF ~50% 3.   Successful DCCV to sinus bradycardia with  a single 150J Biphasic shock.  Recommendations:  1.  INR low today at  1.7  Will increase warfarin dose to 5 mg daily for the next 2 days, then 2.5 mg alternating with 5 mg QOD. Follow-up for INR management with Belenda Cruise on Friday this week (levels have not been consistently managed by her PCP) - we need to ensure a therapeutic INR. 2. Decrease metoprolol to 50 mg BID - first dose in the morning tomorrow. 3.   Will need follow-up with me in 1 month.  Time Spent Directly with the Patient:  60 minutes   Chrystie Nose, MD, [facility] Attending Cardiologist [facility]  11/01/2013, 1:04 PM

## 2013-11-01 NOTE — Progress Notes (Signed)
Echocardiogram Echocardiogram Transesophageal has been performed.  Genene Churn Timoth Schara 11/01/2013, 12:57 PM

## 2013-11-01 NOTE — H&P (Signed)
    INTERVAL PROCEDURE H&P  History and Physical Interval Note:  11/01/2013 10:44 AM  Kendra Hughes has presented today for their planned procedure. The various methods of treatment have been discussed with the patient and family. After consideration of risks, benefits and other options for treatment, the patient has consented to the procedure.  The patients' outpatient history has been reviewed, patient examined, and no change in status from most recent office note within the past 30 days. I have reviewed the patients' chart and labs and will proceed as planned. Questions were answered to the patient's satisfaction.   Chrystie Nose, MD, [facility] Attending Cardiologist [facility]  Chrystie Nose 11/01/2013, 10:44 AM

## 2013-11-01 NOTE — Transfer of Care (Signed)
Immediate Anesthesia Transfer of Care Note  Patient: Kendra Hughes  Procedure(s) Performed: Procedure(s): TRANSESOPHAGEAL ECHOCARDIOGRAM (TEE) (N/A) CARDIOVERSION (N/A)  Patient Location: Endoscopy Unit  Anesthesia Type:General  Level of Consciousness: awake, alert , oriented and patient cooperative  Airway & Oxygen Therapy: Patient Spontanous Breathing and Patient connected to nasal cannula oxygen  Post-op Assessment: Report given to Endo RN; VSS, [place] O2  Post vital signs: Reviewed and stable  Complications: No apparent anesthesia complications

## 2013-11-01 NOTE — Discharge Instructions (Signed)
Electrical Cardioversion °Electrical cardioversion is the delivery of a jolt of electricity to change the rhythm of the heart. Sticky patches or metal paddles are placed on the chest to deliver the electricity from a device. This is done to restore a normal rhythm. A rhythm that is too fast or not regular keeps the heart from pumping well. °Electrical cardioversion is done in an emergency if:  °· There is low or no blood pressure as a result of the heart rhythm.   °· Normal rhythm must be restored as fast as possible to protect the brain and heart from further damage.   °· It may save a life. °Cardioversion may be done for heart rhythms that are not immediately life-threatening, such as atrial fibrillation or flutter, in which:  °· The heart is beating too fast or is not regular.   °· Medicine to change the rhythm has not worked.   °· It is safe to wait in order to allow time for preparation. °· Symptoms of the abnormal rhythm are bothersome. °· The risk of stroke and other serious complications can be reduced. °LET YOUR CAREGIVER KNOW ABOUT:  °· All medicines you are taking, including vitamins, herbs, eye drops, creams, and over-the-counter medicines.   °· Previous problems you or members of your family have had with the use of anesthetics.   °· Any blood disorders you have.   °· Previous surgeries you have had.   °· Medical conditions you have. °RISKS AND COMPLICATIONS  °Generally, this is a safe procedure. However, as with any procedure, complications can occur. Possible complications include:  °· Breathing problems related to the anesthetic used. °· Cardiac arrest This risk is rare. °· A blood clot that breaks free and travels to other parts of your body. This could cause a stroke or other problems. The risk of this is lowered by use of blood thinning medicine (anticoagulant) prior to the procedure. °BEFORE THE PROCEDURE  °· You may have tests to detect blood clots in your heart and evaluate heart  function.  °· You may start taking anticoagulants so your blood does not clot as easily.   °· Medicines may be given to help stabilize your heart rate and rhythm. °PROCEDURE °· You will be given medicine through an IV tube to reduce discomfort and make you sleepy (sedative).   °· An electrical shock will be delivered. °AFTER THE PROCEDURE °Your heart rhythm will be watched to make sure it does not change. You may be able to go home within a few hours.  °Document Released: 06/14/2002 Document Revised: 04/14/2013 Document Reviewed: 01/06/2013 °ExitCare® Patient Information ©2014 [employer]. °Transesophageal Echocardiography °Transesophageal echocardiography (TEE) is a special type of test that produces images of the heart by using sound waves (echocardiogram). This type of echocardiography can obtain better images of the heart than standard echocardiography. TEE is done by passing a flexible tube down the esophagus. The heart is located in front of the esophagus. Because the heart and esophagus are close to one another, your health care provider can take very clear, detailed pictures of the heart via ultrasound waves. °TEE may be done: °· If your health care provider needs more information based on standard echocardiography findings. °· If you had a stroke. This might have happened because a clot formed in your heart. TEE can visualize different areas of the heart and check for clots. °· To check valve anatomy and function. °· To check for infection on the inside of your heart (endocarditis). °· To evaluate the dividing wall (septum) of the heart and presence of   a hole that did not close after birth (patent foramen ovale or atrial septal defect). °· To help diagnose a tear in the wall of the aorta (aortic dissection). °· During cardiac valve surgery. This allows the surgeon to assess the valve repair before closing the chest. °· During a variety of other cardiac procedures to guide positioning of  catheters. °· Sometimes before a cardioversion, which is a shock to convert heart rhythm back to normal. °LET YOUR HEALTH CARE PROVIDER KNOW ABOUT:  °· Any allergies you have. °· All medicines you are taking, including vitamins, herbs, eye drops, creams, and over-the-counter medicines. °· Previous problems you or members of your family have had with the use of anesthetics. °· Any blood disorders you have. °· Previous surgeries you have had. °· Medical conditions you have. °· Swallowing difficulties. °· An esophageal obstruction. °RISKS AND COMPLICATIONS  °Generally, TEE is a safe procedure. However, as with any procedure, complications can occur. Possible complications include an esophageal tear (rupture). °BEFORE THE PROCEDURE  °· Do not eat or drink for 6 hours before the procedure or as directed by your health care provider. °· Arrange for someone to drive you home after the procedure. Do not drive yourself home. During the procedure, you will be given medicines that can continue to make you feel drowsy and can impair your reflexes. °· An IV access tube will be started in the arm. °PROCEDURE  °· A medicine to help you relax (sedative) will be given through the IV access tube. °· A medicine that numbs the area (local anesthetic) may be sprayed in the back of the throat. °· Your blood pressure, heart rate, and breathing (vital signs) will be monitored during the procedure. °· The TEE probe is a long, flexible tube. The tip of the probe is placed into the back of the mouth, and you will be asked to swallow. This helps to pass the tip of the probe into the esophagus. Once the tip of the probe is in the correct area, your health care provider can take pictures of the heart. °· TEE is usually not a painful procedure. You may feel the probe press against the back of the throat. The probe does not enter the trachea and does not affect your breathing. °· Your time spent at the hospital is usually less than 2 hours. °AFTER  THE PROCEDURE  °· You will be in bed, resting, until you have fully returned to consciousness. °· When you first awaken, your throat may feel slightly sore and will probably still feel numb. This will improve slowly over time. °· You will not be allowed to eat or drink until it is clear that the numbness has improved. °· Once you have been able to drink, urinate, and sit on the edge of the bed without feeling sick to your stomach (nausea) or dizzy, you may be cleared to go home. °· You should have a friend or family member with you for the next 24 hours after your procedure. °Document Released: 09/14/2002 Document Revised: 04/14/2013 Document Reviewed: 12/24/2012 °ExitCare® Patient Information ©2014 [employer]. ° °

## 2013-11-01 NOTE — H&P (Deleted)
     INTERVAL PROCEDURE H&P  History and Physical Interval Note:  11/01/2013 11:11 AM  Kendra Hughes has presented today for their planned procedure. The various methods of treatment have been discussed with the patient and family. After consideration of risks, benefits and other options for treatment, the patient has consented to the procedure.  The patients' outpatient history has been reviewed, patient examined, and no change in status from most recent office note within the past 30 days. I have reviewed the patients' chart and labs and will proceed as planned. Questions were answered to the patient's satisfaction.   Chrystie Nose, MD, [facility] Attending Cardiologist [facility]  Chrystie Nose 11/01/2013, 11:11 AM

## 2013-11-01 NOTE — Anesthesia Preprocedure Evaluation (Addendum)
Anesthesia Evaluation  Patient identified by MRN, date of birth, ID band Patient awake    Reviewed: Allergy & Precautions, H&P , NPO status , Patient's Chart, lab work & pertinent test results  History of Anesthesia Complications Negative for: history of anesthetic complications  Airway Mallampati: II  Neck ROM: full    Dental   Pulmonary shortness of breath,          Cardiovascular hypertension, Pt. on medications +CHF and + DOE + dysrhythmias Atrial Fibrillation  08/2013 echo:  EF 50-55% Mild AI; Mild MR   Neuro/Psych negative neurological ROS  negative psych ROS   GI/Hepatic negative GI ROS, Neg liver ROS,   Endo/Other  negative endocrine ROS  Renal/GU negative Renal ROS     Musculoskeletal negative musculoskeletal ROS (+)   Abdominal   Peds  Hematology negative hematology ROS (+)   Anesthesia Other Findings   Reproductive/Obstetrics                        Anesthesia Physical Anesthesia Plan  ASA: III  Anesthesia Plan: General   Post-op Pain Management:    Induction:   Airway Management Planned: Mask  Additional Equipment:   Intra-op Plan:   Post-operative Plan:   Informed Consent: I have reviewed the patients History and Physical, chart, labs and discussed the procedure including the risks, benefits and alternatives for the proposed anesthesia with the patient or authorized representative who has indicated his/her understanding and acceptance.   Dental advisory given  Plan Discussed with:   Anesthesia Plan Comments:         Anesthesia Quick Evaluation

## 2013-11-02 ENCOUNTER — Encounter (HOSPITAL_COMMUNITY): Payer: Self-pay | Admitting: Internal Medicine

## 2013-11-05 ENCOUNTER — Ambulatory Visit (INDEPENDENT_AMBULATORY_CARE_PROVIDER_SITE_OTHER): Payer: Medicare Other | Admitting: Pharmacist Clinician (PhC)/ Clinical Pharmacy Specialist

## 2013-11-05 DIAGNOSIS — Z7901 Long term (current) use of anticoagulants: Secondary | ICD-10-CM

## 2013-11-05 DIAGNOSIS — I4891 Unspecified atrial fibrillation: Secondary | ICD-10-CM

## 2013-11-05 DIAGNOSIS — I480 Paroxysmal atrial fibrillation: Secondary | ICD-10-CM

## 2013-11-05 LAB — POCT INR: INR: 3.4

## 2013-11-08 ENCOUNTER — Telehealth: Payer: Self-pay | Admitting: Pharmacist Clinician (PhC)/ Clinical Pharmacy Specialist

## 2013-11-08 NOTE — Telephone Encounter (Signed)
Pt called, LMOM to return call  Pt had been in for INR check, was complaining of swelling in calves.  Advised to increase lasix for 3 days (40 to 80mg ).  If no improvement call Dr. nurse.  Pt calling back today, states no improvement and has several questions for Dr. Blanchie Dessert.  Transferred pt to scheduling to get her onto his or PA schedule.

## 2013-11-12 ENCOUNTER — Encounter: Payer: Self-pay | Admitting: Internal Medicine

## 2013-11-12 ENCOUNTER — Ambulatory Visit (INDEPENDENT_AMBULATORY_CARE_PROVIDER_SITE_OTHER): Payer: Medicare Other | Admitting: Internal Medicine

## 2013-11-12 ENCOUNTER — Ambulatory Visit (INDEPENDENT_AMBULATORY_CARE_PROVIDER_SITE_OTHER): Payer: Medicare Other | Admitting: Pharmacist Clinician (PhC)/ Clinical Pharmacy Specialist

## 2013-11-12 VITALS — BP 134/70 | HR 75 | Ht 65.5 in | Wt 164.2 lb

## 2013-11-12 DIAGNOSIS — R0989 Other specified symptoms and signs involving the circulatory and respiratory systems: Secondary | ICD-10-CM

## 2013-11-12 DIAGNOSIS — R0609 Other forms of dyspnea: Secondary | ICD-10-CM

## 2013-11-12 DIAGNOSIS — I38 Endocarditis, valve unspecified: Secondary | ICD-10-CM

## 2013-11-12 DIAGNOSIS — I4891 Unspecified atrial fibrillation: Secondary | ICD-10-CM

## 2013-11-12 DIAGNOSIS — Z7901 Long term (current) use of anticoagulants: Secondary | ICD-10-CM

## 2013-11-12 DIAGNOSIS — I480 Paroxysmal atrial fibrillation: Secondary | ICD-10-CM

## 2013-11-12 DIAGNOSIS — I5031 Acute diastolic (congestive) heart failure: Secondary | ICD-10-CM

## 2013-11-12 LAB — POCT INR: INR: 5.3

## 2013-11-12 NOTE — Patient Instructions (Signed)
Your physician recommends that you schedule a follow-up appointment in: SIX MONTHS with Dr.Hilty.   

## 2013-11-12 NOTE — Progress Notes (Signed)
OFFICE NOTE  Chief Complaint:  Follow-up cardioversion  Primary Care Physician: Carmin Richmond, MD  HPI:  Kendra Hughes is an 80 year old female who I've been following for some time with a history of paroxysmal atrial fibrillation. Despite her CHADS2 score of 2, she has refused warfarin anticoagulation in the past, opting for aspirin which are documented extensively he is insufficient to protect her from stroke. She had worsening shortness of breath and apparently she and her husband tried to call the office to schedule an appointment with me but could not get an appointment.  When I saw her in the office a few months ago, she was notably breathless and can hardly finish sentences. An EKG demonstrated atrial fibrillation with rapid ventricular response at a rate of 173. She did appear somewhat dizzy and her blood pressure was low around 100 systolic.  I admitted her to the hospital and she was an inpatient for 1 week.  She was diuresed and had better rate control of her A. fib. She did undergo a TEE, which demonstrated a left atrial appendage thrombus. Based on that she could not be cardioverted and is recommended that she remain on warfarin with therapeutic INRs for at least one month.  Discharge weight was 168 and she is 166 pounds today.  She does report some mild edema which is dependent in the right leg but none in the left but does improve with elevation at night.  After her last office visit, recommended repeat TEE which demonstrated resolution of her left atrial appendage thrombus. She then underwent elective cardioversion which was successful in converting her back to sinus rhythm. After she returned today I asked her how she was feeling and she reports that she feels "great". I asked her she was feeling any palpitations or irregularity to her heart beat and she denied it. I then presented her with her EKG which demonstrates that she has gone back into atrial fibrillation, suggesting that  she is not symptomatic from A. fib and generally unaware of it. She does report that she takes her blood pressure and pulse every day and noted that after about 5 days of her heart rate being in the mid 50s it increased up into the 70s and 80s, signifying that is probably when she went back into A. fib.  PMHx:  Past Medical History  Diagnosis Date  . PAF (paroxysmal atrial fibrillation)   . Hypertension   . Dyslipidemia   . DOE (dyspnea on exertion)   . History of nuclear stress test 10/2009    normal exam    Past Surgical History  Procedure Laterality Date  . Abdominal hysterectomy    . Biopsy breast      right  . Transthoracic echocardiogram  07/22/2012    EF 55-60%; mild AV regurg; mild MR; mild-mod TR  . Tee without cardioversion N/A 11/01/2013    Procedure: TRANSESOPHAGEAL ECHOCARDIOGRAM (TEE);  Surgeon: Chrystie Nose, MD;  Location: [facility] ENDOSCOPY;  Service: Cardiovascular;  Laterality: N/A;  . Cardioversion N/A 11/01/2013    Procedure: CARDIOVERSION;  Surgeon: Chrystie Nose, MD;  Location: [facility] ENDOSCOPY;  Service: Cardiovascular;  Laterality: N/A;    FAMHx:  No family history on file.  SOCHx:   reports that she has never smoked. She has never used smokeless tobacco. She reports that she does not drink alcohol or use illicit drugs.  ALLERGIES:  No Known Allergies  ROS: A comprehensive review of systems was negative except for: Cardiovascular: positive  for lower extremity edema  HOME MEDS: Current Outpatient Prescriptions  Medication Sig Dispense Refill  . atorvastatin (LIPITOR) 10 MG tablet Take 10 mg by mouth daily.      . Cholecalciferol (VITAMIN D PO) Take 1,000 mg by mouth daily.       . digoxin (LANOXIN) 0.125 MG tablet Take 1 tablet (0.125 mg total) by mouth daily.  30 tablet  3  . diltiazem (CARDIZEM CD) 120 MG 24 hr capsule Take 1 capsule (120 mg total) by mouth daily.  30 capsule  3  . Fexofenadine HCl (ALLEGRA PO) Take 1 tablet by mouth as needed  (allergies).       . Fluticasone Propionate (FLONASE NA) Place 1 spray into the nose as needed (allergies).       . folic acid (FOLVITE) 800 MCG tablet Take 800 mcg by mouth daily.      . furosemide (LASIX) 40 MG tablet Take 1 tablet (40 mg total) by mouth daily.  30 tablet  3  . metoprolol (LOPRESSOR) 50 MG tablet Take 1 tablet (50 mg total) by mouth 2 (two) times daily.  90 tablet  3  . warfarin (COUMADIN) 2.5 MG tablet Take 1 tablet (2.5 mg total) by mouth daily. Take 5 mg on 4/27 and 5 mg on 4/28, then 2.5 mg on 4/29 then alternate every other day with 5 mg.  60 tablet  3   No current facility-administered medications for this visit.    LABS/IMAGING: No results found for this or any previous visit (from the past 48 hour(s)). No results found.  VITALS: BP 134/70  Pulse 75  Ht 5' 5.5" (1.664 m)  Wt 164 lb 3.2 oz (74.481 kg)  BMI 26.90 kg/m2  EXAM: General appearance: alert and no distress Neck: no carotid bruit and no JVD Lungs: clear to auscultation bilaterally Heart: irregularly irregular rhythm Abdomen: soft, non-tender; bowel sounds normal; no masses,  no organomegaly Extremities: trace RLE edema Pulses: 2+ and symmetric . Skin: Skin color, texture, turgor normal. No rashes or lesions Neurologic: Grossly normal Psych: Normal  EKG: Atrial fibrillation with controlled ventricular response at 75  ASSESSMENT: 1. Recent acute diastolic HF, secondary to AF 2. Atrial fibrillation with rapid ventricular response - now rate controlled 3. LAA thrombus 4. Mild AI, mild to moderate AR, EF 50-55%  PLAN: 1.   Kendra Hughes has improved significantly with rate control and diuresis. Her weight appears stable on her current diuretics. I did suggest that she could take an extra 20 mg dose of Lasix in the afternoon if her swelling increases which has been a problem recently. She would also benefit from lower extremity compression stockings. Overall her heart failure is fairly compensated.  She unfortunately went back to atrial fibrillation only about 5 days after her last cardioversion. It does seem that she is not symptomatic with regards to A. fib therefore I do not feel that we need to be aggressive about trying to keep her in rhythm or treat her with antiarrhythmic therapy. For now I will recommend focusing on a rate control strategy and adequate diuresis to keep her heart failure compensated. Plan to see her back in 6 months.  Chrystie Nose, MD, [facility] Attending Cardiologist [facility]  Chrystie Nose 11/12/2013, 1:29 PM

## 2013-11-17 ENCOUNTER — Ambulatory Visit (INDEPENDENT_AMBULATORY_CARE_PROVIDER_SITE_OTHER): Payer: Medicare Other | Admitting: Pharmacist Clinician (PhC)/ Clinical Pharmacy Specialist

## 2013-11-17 DIAGNOSIS — I480 Paroxysmal atrial fibrillation: Secondary | ICD-10-CM

## 2013-11-17 DIAGNOSIS — I4891 Unspecified atrial fibrillation: Secondary | ICD-10-CM

## 2013-11-17 DIAGNOSIS — Z7901 Long term (current) use of anticoagulants: Secondary | ICD-10-CM

## 2013-11-17 LAB — POCT INR: INR: 1.8

## 2013-11-25 LAB — POCT INR: INR: 2.9

## 2013-11-26 ENCOUNTER — Ambulatory Visit (INDEPENDENT_AMBULATORY_CARE_PROVIDER_SITE_OTHER): Payer: Medicare Other | Admitting: Pharmacist Clinician (PhC)/ Clinical Pharmacy Specialist

## 2014-02-14 ENCOUNTER — Ambulatory Visit (INDEPENDENT_AMBULATORY_CARE_PROVIDER_SITE_OTHER): Payer: Medicare Other | Admitting: Internal Medicine

## 2014-02-14 ENCOUNTER — Encounter: Payer: Self-pay | Admitting: Internal Medicine

## 2014-02-14 VITALS — BP 130/80 | HR 82 | Ht 65.0 in | Wt 156.2 lb

## 2014-02-14 DIAGNOSIS — E785 Hyperlipidemia, unspecified: Secondary | ICD-10-CM

## 2014-02-14 DIAGNOSIS — I5189 Other ill-defined heart diseases: Secondary | ICD-10-CM

## 2014-02-14 DIAGNOSIS — I519 Heart disease, unspecified: Secondary | ICD-10-CM

## 2014-02-14 DIAGNOSIS — I4891 Unspecified atrial fibrillation: Secondary | ICD-10-CM

## 2014-02-14 DIAGNOSIS — I10 Essential (primary) hypertension: Secondary | ICD-10-CM

## 2014-02-14 DIAGNOSIS — I4819 Other persistent atrial fibrillation: Secondary | ICD-10-CM

## 2014-02-14 NOTE — Progress Notes (Signed)
OFFICE NOTE  Chief Complaint:  No complaints  Primary Care Physician: Carmin Richmond, MD  HPI:  Kendra Hughes is an 80 year old female who I've been following for some time with a history of paroxysmal atrial fibrillation. Despite her CHADS2 score of 2, she has refused warfarin anticoagulation in the past, opting for aspirin which are documented extensively he is insufficient to protect her from stroke. She had worsening shortness of breath and apparently she and her husband tried to call the office to schedule an appointment with me but could not get an appointment.  When I saw her in the office a few months ago, she was notably breathless and can hardly finish sentences. An EKG demonstrated atrial fibrillation with rapid ventricular response at a rate of 173. She did appear somewhat dizzy and her blood pressure was low around 100 systolic.  I admitted her to the hospital and she was an inpatient for 1 week.  She was diuresed and had better rate control of her A. fib. She did undergo a TEE, which demonstrated a left atrial appendage thrombus. Based on that she could not be cardioverted and is recommended that she remain on warfarin with therapeutic INRs for at least one month.  Discharge weight was 168 and she is 166 pounds today.  She does report some mild edema which is dependent in the right leg but none in the left but does improve with elevation at night.  After her last office visit, recommended repeat TEE which demonstrated resolution of her left atrial appendage thrombus. She then underwent elective cardioversion which was successful in converting her back to sinus rhythm. After she returned today I asked her how she was feeling and she reports that she feels "great". I asked her she was feeling any palpitations or irregularity to her heart beat and she denied it. I then presented her with her EKG which demonstrates that she has gone back into atrial fibrillation, suggesting that she is not  symptomatic from A. fib and generally unaware of it. She does report that she takes her blood pressure and pulse every day and noted that after about 5 days of her heart rate being in the mid 50s it increased up into the 70s and 80s, signifying that is probably when she went back into A. Fib.  Kendra Hughes returns today for followup. She's had marked improvement in her swelling and is now down 10 pounds. She's been wearing her lower extremity compression stockings with marked improvement. She had recent laboratory work from her primary care provider which showed an elevated triglyceride level of 253, total cholesterol 154, HDL 36 and LDL 67. It should be noted that these were nonfasting labs. There was a increase in her Lipitor to 20 mg daily.  PMHx:  Past Medical History  Diagnosis Date  . PAF (paroxysmal atrial fibrillation)   . Hypertension   . Dyslipidemia   . DOE (dyspnea on exertion)   . History of nuclear stress test 10/2009    normal exam    Past Surgical History  Procedure Laterality Date  . Abdominal hysterectomy    . Biopsy breast      right  . Transthoracic echocardiogram  07/22/2012    EF 55-60%; mild AV regurg; mild MR; mild-mod TR  . Tee without cardioversion N/A 11/01/2013    Procedure: TRANSESOPHAGEAL ECHOCARDIOGRAM (TEE);  Surgeon: Chrystie Nose, MD;  Location: [facility] ENDOSCOPY;  Service: Cardiovascular;  Laterality: N/A;  . Cardioversion N/A 11/01/2013  Procedure: CARDIOVERSION;  Surgeon: Chrystie Nose, MD;  Location: [facility] ENDOSCOPY;  Service: Cardiovascular;  Laterality: N/A;    FAMHx:  No family history on file.  SOCHx:   reports that she has never smoked. She has never used smokeless tobacco. She reports that she does not drink alcohol or use illicit drugs.  ALLERGIES:  No Known Allergies  ROS: A comprehensive review of systems was negative.  HOME MEDS: Current Outpatient Prescriptions  Medication Sig Dispense Refill  . atorvastatin (LIPITOR) 20 MG tablet  Take 20 mg by mouth daily.      . Cholecalciferol (VITAMIN D PO) Take 1,000 mg by mouth daily.       . digoxin (LANOXIN) 0.125 MG tablet Take 1 tablet (0.125 mg total) by mouth daily.  30 tablet  3  . diltiazem (CARDIZEM CD) 120 MG 24 hr capsule Take 1 capsule (120 mg total) by mouth daily.  30 capsule  3  . Fexofenadine HCl (ALLEGRA PO) Take 1 tablet by mouth as needed (allergies).       . Fluticasone Propionate (FLONASE NA) Place 1 spray into the nose as needed (allergies).       . folic acid (FOLVITE) 800 MCG tablet Take 800 mcg by mouth daily.      . furosemide (LASIX) 40 MG tablet Take 1 tablet (40 mg total) by mouth daily.  30 tablet  3  . metoprolol (LOPRESSOR) 50 MG tablet Take 1 tablet (50 mg total) by mouth 2 (two) times daily.  90 tablet  3  . warfarin (COUMADIN) 3 MG tablet Take 3 mg by mouth. As directed per INR       No current facility-administered medications for this visit.    LABS/IMAGING: No results found for this or any previous visit (from the past 48 hour(s)). No results found.  VITALS: BP 130/80  Pulse 82  Ht 5\' 5"  (1.651 m)  Wt 156 lb 3.2 oz (70.852 kg)  BMI 25.99 kg/m2  EXAM: General appearance: alert and no distress Neck: no carotid bruit and no JVD Lungs: clear to auscultation bilaterally Heart: irregularly irregular rhythm Abdomen: soft, non-tender; bowel sounds normal; no masses,  no organomegaly Extremities: trace RLE edema Pulses: 2+ and symmetric . Skin: Skin color, texture, turgor normal. No rashes or lesions Neurologic: Grossly normal Psych: Normal  EKG: Atrial fibrillation with controlled ventricular response at 82  ASSESSMENT: 1. Recent acute diastolic HF - now compensated 2. Persistent atrial fibrillation 3. 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 Mild AI, mild to moderate AR, EF 50-55%  PLAN: 1.   Kendra Hughes has had marked improvement in her volume status. She appears euvolemic now after 10 pounds weight loss. She is wearing lower  extremity compression stockings throughout the day which has made a big improvement in her swelling. She is likely in persistent if not chronic A. fib now that she failed cardioversion. She will need lifelong anticoagulation. Overall she is doing very well. She recently had a lipid profile which unfortunately was nonfasting. Her triglycerides are elevated however this is likely due to the fact that she ate only an hour before the blood draw. I would recommend testing fasting lipid profiles as can be markedly elevated after a recent meal, which likely is the explanation in this case. Overall her LDL is very low and total cholesterol is very low. Increased dose of Lipitor may actually lower HDL cholesterol in addition to LDL cholesterol with little effect on triglycerides.   I will plan to  see her back in 6 months.  Chrystie Nose, MD, [facility] Attending Cardiologist [facility]  HILTY,Kenneth C 02/14/2014, 3:10 PM

## 2014-02-14 NOTE — Patient Instructions (Signed)
Your physician wants you to follow-up in:  6 months. You will receive a reminder letter in the mail two months in advance. If you don't receive a letter, please call our office to schedule the follow-up appointment.   

## 2014-05-05 ENCOUNTER — Other Ambulatory Visit: Payer: Self-pay | Admitting: *Deleted

## 2014-07-21 ENCOUNTER — Encounter (HOSPITAL_COMMUNITY): Payer: Self-pay | Admitting: Cardiovascular Disease

## 2014-08-15 ENCOUNTER — Ambulatory Visit (INDEPENDENT_AMBULATORY_CARE_PROVIDER_SITE_OTHER): Payer: Medicare Other | Admitting: Internal Medicine

## 2014-08-15 ENCOUNTER — Encounter: Payer: Self-pay | Admitting: Internal Medicine

## 2014-08-15 VITALS — BP 122/80 | HR 65 | Ht 65.0 in | Wt 152.8 lb

## 2014-08-15 DIAGNOSIS — I481 Persistent atrial fibrillation: Secondary | ICD-10-CM

## 2014-08-15 DIAGNOSIS — I10 Essential (primary) hypertension: Secondary | ICD-10-CM

## 2014-08-15 DIAGNOSIS — I4819 Other persistent atrial fibrillation: Secondary | ICD-10-CM

## 2014-08-15 DIAGNOSIS — I5189 Other ill-defined heart diseases: Secondary | ICD-10-CM

## 2014-08-15 DIAGNOSIS — E785 Hyperlipidemia, unspecified: Secondary | ICD-10-CM

## 2014-08-15 DIAGNOSIS — I519 Heart disease, unspecified: Secondary | ICD-10-CM

## 2014-08-15 NOTE — Patient Instructions (Signed)
Your physician wants you to follow-up in: 6 months with Dr. Hilty. You will receive a reminder letter in the mail two months in advance. If you don't receive a letter, please call our office to schedule the follow-up appointment.    

## 2014-08-15 NOTE — Progress Notes (Signed)
OFFICE NOTE  Chief Complaint:  Short of breath with exertion  Primary Care Physician: Carmin Richmond, MD  HPI:  Kendra Hughes is an 81 year old female who I've been following for some time with a history of paroxysmal atrial fibrillation. Despite her CHADS2 score of 2, she has refused warfarin anticoagulation in the past, opting for aspirin which are documented extensively he is insufficient to protect her from stroke. She had worsening shortness of breath and apparently she and her husband tried to call the office to schedule an appointment with me but could not get an appointment.  When I saw her in the office a few months ago, she was notably breathless and can hardly finish sentences. An EKG demonstrated atrial fibrillation with rapid ventricular response at a rate of 173. She did appear somewhat dizzy and her blood pressure was low around 100 systolic.  I admitted her to the hospital and she was an inpatient for 1 week.  She was diuresed and had better rate control of her A. fib. She did undergo a TEE, which demonstrated a left atrial appendage thrombus. Based on that she could not be cardioverted and is recommended that she remain on warfarin with therapeutic INRs for at least one month.  Discharge weight was 168 and she is 166 pounds today.  She does report some mild edema which is dependent in the right leg but none in the left but does improve with elevation at night.  After her last office visit, recommended repeat TEE which demonstrated resolution of her left atrial appendage thrombus. She then underwent elective cardioversion which was successful in converting her back to sinus rhythm. After she returned today I asked her how she was feeling and she reports that she feels "great". I asked her she was feeling any palpitations or irregularity to her heart beat and she denied it. I then presented her with her EKG which demonstrates that she has gone back into atrial fibrillation, suggesting  that she is not symptomatic from A. fib and generally unaware of it. She does report that she takes her blood pressure and pulse every day and noted that after about 5 days of her heart rate being in the mid 50s it increased up into the 70s and 80s, signifying that is probably when she went back into A. Fib.  She's had marked improvement in her swelling and is now down 10 pounds. She's been wearing her lower extremity compression stockings with marked improvement. She had recent laboratory work from her primary care provider which showed an elevated triglyceride level of 253, total cholesterol 154, HDL 36 and LDL 67. It should be noted that these were nonfasting labs. There was a increase in her Lipitor to 20 mg daily.  I saw Kendra Hughes back in the office today. She reports a big improvement in her shortness of breath however she still has persistent shortness of breath with exertion. Her weight is down now over 15 pounds with diuresis and she remains euvolemic. There is no evidence of any peripheral edema. She does report of breathing is better than it had been in the past but she still says she gets short of breath with walking long distances.   PMHx:  Past Medical History  Diagnosis Date  . PAF (paroxysmal atrial fibrillation)   . Hypertension   . Dyslipidemia   . DOE (dyspnea on exertion)   . History of nuclear stress test 10/2009    normal exam    Past  Surgical History  Procedure Laterality Date  . Abdominal hysterectomy    . Biopsy breast      right  . Transthoracic echocardiogram  07/22/2012    EF 55-60%; mild AV regurg; mild MR; mild-mod TR  . Tee without cardioversion N/A 11/01/2013    Procedure: TRANSESOPHAGEAL ECHOCARDIOGRAM (TEE);  Surgeon: Chrystie Nose, MD;  Location: [facility] ENDOSCOPY;  Service: Cardiovascular;  Laterality: N/A;  . Cardioversion N/A 11/01/2013    Procedure: CARDIOVERSION;  Surgeon: Chrystie Nose, MD;  Location: [facility] ENDOSCOPY;  Service: Cardiovascular;  Laterality:  N/A;    FAMHx:  No family history on file.  SOCHx:   reports that she has never smoked. She has never used smokeless tobacco. She reports that she does not drink alcohol or use illicit drugs.  ALLERGIES:  No Known Allergies  ROS: A comprehensive review of systems was negative except for: Respiratory: positive for dyspnea on exertion  HOME MEDS: Current Outpatient Prescriptions  Medication Sig Dispense Refill  . atorvastatin (LIPITOR) 10 MG tablet Take 10 mg by mouth daily.    . Cholecalciferol (VITAMIN D PO) Take 1,000 mg by mouth daily.     . digoxin (LANOXIN) 0.125 MG tablet Take 1 tablet (0.125 mg total) by mouth daily. 30 tablet 3  . diltiazem (CARDIZEM CD) 120 MG 24 hr capsule Take 1 capsule (120 mg total) by mouth daily. 30 capsule 3  . Fexofenadine HCl (ALLEGRA PO) Take 1 tablet by mouth as needed (allergies).     . Fluticasone Propionate (FLONASE NA) Place 1 spray into the nose as needed (allergies).     . folic acid (FOLVITE) 800 MCG tablet Take 800 mcg by mouth daily.    . furosemide (LASIX) 40 MG tablet Take 1 tablet (40 mg total) by mouth daily. 30 tablet 3  . metoprolol (LOPRESSOR) 50 MG tablet Take 1 tablet (50 mg total) by mouth 2 (two) times daily. 90 tablet 3  . warfarin (COUMADIN) 3 MG tablet Take 3 mg by mouth. As directed per INR     No current facility-administered medications for this visit.    LABS/IMAGING: No results found for this or any previous visit (from the past 48 hour(s)). No results found.  VITALS: BP 122/80 mmHg  Pulse 65  Ht 5\' 5"  (1.651 m)  Wt 152 lb 12.8 oz (69.31 kg)  BMI 25.43 kg/m2  EXAM: General appearance: alert and no distress Neck: no carotid bruit and no JVD Lungs: clear to auscultation bilaterally Heart: irregularly irregular rhythm Abdomen: soft, non-tender; bowel sounds normal; no masses,  no organomegaly Extremities: extremities normal, atraumatic, no cyanosis or edema Pulses: 2+ and symmetric . Skin: Skin color,  texture, turgor normal. No rashes or lesions Neurologic: Grossly normal Psych: Normal  EKG: Atrial fibrillation with controlled ventricular response at 65  ASSESSMENT: 1. Recent acute diastolic HF - now compensated 2. Persistent atrial fibrillation 3. 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 Mild AI, mild to moderate AR, EF 50-55%  PLAN: 1.   Mrs. Hausmann has had significant improvement on diuretics and appears to be euvolemic. She still is short of breath with exertion, which begs the question as to whether some of her shortness of breath may not be due to diastolic dysfunction. She remains a persistent AF and is most likely permanent at this point. She does report some augmentation of her heart rate with exercise. She is also on warfarin anticoagulation. I recommend continuing her current medications and will plan to see her back in 6  months.  Chrystie Nose, MD, [facility] Attending Cardiologist [facility]  HILTY,Kenneth C 08/15/2014, 1:24 PM

## 2014-08-17 ENCOUNTER — Encounter: Payer: Self-pay | Admitting: Internal Medicine

## 2014-08-19 ENCOUNTER — Other Ambulatory Visit: Payer: Self-pay | Admitting: Internal Medicine

## 2014-08-19 NOTE — Telephone Encounter (Signed)
Rx(s) sent to pharmacy electronically.  

## 2014-11-14 ENCOUNTER — Telehealth: Payer: Self-pay | Admitting: Internal Medicine

## 2014-11-18 NOTE — Telephone Encounter (Signed)
Close encounter 

## 2014-12-01 ENCOUNTER — Encounter: Payer: Self-pay | Admitting: Internal Medicine

## 2015-02-13 ENCOUNTER — Ambulatory Visit: Payer: Medicare Other | Admitting: Internal Medicine

## 2015-02-17 ENCOUNTER — Ambulatory Visit (INDEPENDENT_AMBULATORY_CARE_PROVIDER_SITE_OTHER): Payer: Medicare Other | Admitting: Internal Medicine

## 2015-02-17 ENCOUNTER — Encounter: Payer: Self-pay | Admitting: Internal Medicine

## 2015-02-17 VITALS — BP 118/86 | HR 57 | Ht 65.5 in | Wt 149.5 lb

## 2015-02-17 DIAGNOSIS — I481 Persistent atrial fibrillation: Secondary | ICD-10-CM | POA: Diagnosis not present

## 2015-02-17 DIAGNOSIS — I10 Essential (primary) hypertension: Secondary | ICD-10-CM | POA: Diagnosis not present

## 2015-02-17 DIAGNOSIS — I519 Heart disease, unspecified: Secondary | ICD-10-CM | POA: Diagnosis not present

## 2015-02-17 DIAGNOSIS — I4819 Other persistent atrial fibrillation: Secondary | ICD-10-CM

## 2015-02-17 DIAGNOSIS — I5189 Other ill-defined heart diseases: Secondary | ICD-10-CM

## 2015-02-17 MED ORDER — DILTIAZEM HCL ER COATED BEADS 120 MG PO CP24: 120.0000 mg | ORAL_CAPSULE | Freq: Every day | ORAL | Status: DC

## 2015-02-17 MED ORDER — METOPROLOL TARTRATE 50 MG PO TABS: 50.0000 mg | ORAL_TABLET | Freq: Two times a day (BID) | ORAL | Status: DC

## 2015-02-17 NOTE — Progress Notes (Signed)
OFFICE NOTE  Chief Complaint:  Short of breath with exertion  Primary Care Physician: Carmin Richmond, MD  HPI:  Kendra Hughes is an 81 year old female who I've been following for some time with a history of paroxysmal atrial fibrillation. Despite her CHADS2 score of 2, she has refused warfarin anticoagulation in the past, opting for aspirin which are documented extensively he is insufficient to protect her from stroke. She had worsening shortness of breath and apparently she and her husband tried to call the office to schedule an appointment with me but could not get an appointment.  When I saw her in the office a few months ago, she was notably breathless and can hardly finish sentences. An EKG demonstrated atrial fibrillation with rapid ventricular response at a rate of 173. She did appear somewhat dizzy and her blood pressure was low around 100 systolic.  I admitted her to the hospital and she was an inpatient for 1 week.  She was diuresed and had better rate control of her A. fib. She did undergo a TEE, which demonstrated a left atrial appendage thrombus. Based on that she could not be cardioverted and is recommended that she remain on warfarin with therapeutic INRs for at least one month.  Discharge weight was 168 and she is 166 pounds today.  She does report some mild edema which is dependent in the right leg but none in the left but does improve with elevation at night.  After her last office visit, recommended repeat TEE which demonstrated resolution of her left atrial appendage thrombus. She then underwent elective cardioversion which was successful in converting her back to sinus rhythm. After she returned today I asked her how she was feeling and she reports that she feels "great". I asked her she was feeling any palpitations or irregularity to her heart beat and she denied it. I then presented her with her EKG which demonstrates that she has gone back into atrial fibrillation, suggesting  that she is not symptomatic from A. fib and generally unaware of it. She does report that she takes her blood pressure and pulse every day and noted that after about 5 days of her heart rate being in the mid 50s it increased up into the 70s and 80s, signifying that is probably when she went back into A. Fib.  She's had marked improvement in her swelling and is now down 10 pounds. She's been wearing her lower extremity compression stockings with marked improvement. She had recent laboratory work from her primary care provider which showed an elevated triglyceride level of 253, total cholesterol 154, HDL 36 and LDL 67. It should be noted that these were nonfasting labs. There was a increase in her Lipitor to 20 mg daily.  I saw [name] back in the office today. She reports a big improvement in her shortness of breath however she still has persistent shortness of breath with exertion. Her weight is down now over 15 pounds with diuresis and she remains euvolemic. There is no evidence of any peripheral edema. She does report of breathing is better than it had been in the past but she still says she gets short of breath with walking long distances.   [name] seems to be doing very well. In the office today she reports stable shortness of breath. She is asking to come off some medicines if possible. She remains in A. fib with a slow ventricular response. Her INR has been therapeutic and followed by her primary.  She recently had lab work including a lipid profile. Her triglycerides were elevated in the 300s however this was nonfasting.  PMHx:  Past Medical History  Diagnosis Date  . PAF (paroxysmal atrial fibrillation)   . Hypertension   . Dyslipidemia   . DOE (dyspnea on exertion)   . History of nuclear stress test 10/2009    normal exam    Past Surgical History  Procedure Laterality Date  . Abdominal hysterectomy    . Biopsy breast      right  . Transthoracic echocardiogram  07/22/2012    EF 55-60%; mild  AV regurg; mild MR; mild-mod TR  . Tee without cardioversion N/A 11/01/2013    Procedure: TRANSESOPHAGEAL ECHOCARDIOGRAM (TEE);  Surgeon: Chrystie Nose, MD;  Location: [facility] ENDOSCOPY;  Service: Cardiovascular;  Laterality: N/A;  . Cardioversion N/A 11/01/2013    Procedure: CARDIOVERSION;  Surgeon: Chrystie Nose, MD;  Location: [facility] ENDOSCOPY;  Service: Cardiovascular;  Laterality: N/A;    FAMHx:  No family history on file.  SOCHx:   reports that she has never smoked. She has never used smokeless tobacco. She reports that she does not drink alcohol or use illicit drugs.  ALLERGIES:  No Known Allergies  ROS: A comprehensive review of systems was negative except for: Respiratory: positive for dyspnea on exertion  HOME MEDS: Current Outpatient Prescriptions  Medication Sig Dispense Refill  . atorvastatin (LIPITOR) 10 MG tablet Take 10 mg by mouth daily.    . Cholecalciferol (VITAMIN D PO) Take 1,000 mg by mouth daily.     [name] diltiazem (CARDIZEM CD) 120 MG 24 hr capsule Take 1 capsule (120 mg total) by mouth daily. 90 capsule 3  . Fexofenadine HCl (ALLEGRA PO) Take 1 tablet by mouth as needed (allergies).     . Fluticasone Propionate (FLONASE NA) Place 1 spray into the nose as needed (allergies).     . folic acid (FOLVITE) 800 MCG tablet Take 800 mcg by mouth daily.    . furosemide (LASIX) 40 MG tablet Take 1 tablet (40 mg total) by mouth daily. 30 tablet 3  . metoprolol (LOPRESSOR) 50 MG tablet Take 1 tablet (50 mg total) by mouth 2 (two) times daily. 180 tablet 3  . warfarin (COUMADIN) 3 MG tablet Take 1 tablet by mouth daily. Or as directed by INR.     No current facility-administered medications for this visit.    LABS/IMAGING: No results found for this or any previous visit (from the past 48 hour(s)). No results found.  VITALS: BP 118/86 mmHg  Pulse 57  Ht 5' 5.5" (1.664 m)  Wt 149 lb 8 oz (67.813 kg)  BMI 24.49 kg/m2  EXAM: General appearance: alert and no  distress Neck: no carotid bruit and no JVD Lungs: clear to auscultation bilaterally Heart: irregularly irregular rhythm Abdomen: soft, non-tender; bowel sounds normal; no masses,  no organomegaly Extremities: extremities normal, atraumatic, no cyanosis or edema Pulses: 2+ and symmetric . Skin: Skin color, texture, turgor normal. No rashes or lesions Neurologic: Grossly normal Psych: Normal  EKG: Atrial fibrillation with controlled ventricular response at 57  ASSESSMENT: 1. Chronic diastolic heart failure 2. Persistent atrial fibrillation - with slow ventricular response 3. 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 Mild AI, mild to moderate AR, EF 50-55%  PLAN: 1.   Mrs. Kowalchuk has persisted if not permanent atrial fibrillation at this point. Heart rate response is somewhat low, therefore I think we can discontinue her digoxin. This is probably playing little role in  rate control and given her age, the therapeutic window for the use of digoxin is very narrow. We'll continue her diltiazem and metoprolol. Her INR is therapeutic on warfarin. She is on Lipitor with a good LDL cholesterol in the 80s, however recent triglycerides were very high. Part of this was due to nonfasting state.  Plan to see her back in 6 months.  Chrystie Nose, MD, [facility] Attending Cardiologist [facility]  Chrystie Nose 02/17/2015, 2:56 PM

## 2015-02-17 NOTE — Patient Instructions (Signed)
Your physician wants you to follow-up in: 6 months with Dr. Rennis Golden. You will receive a reminder letter in the mail two months in advance. If you don't receive a letter, please call our office to schedule the follow-up appointment.  Your physician has recommended you make the following change in your medication: STOP digoxin

## 2015-08-21 ENCOUNTER — Encounter: Payer: Self-pay | Admitting: Internal Medicine

## 2015-08-21 ENCOUNTER — Ambulatory Visit (INDEPENDENT_AMBULATORY_CARE_PROVIDER_SITE_OTHER): Payer: Medicare Other | Admitting: Internal Medicine

## 2015-08-21 VITALS — BP 126/86 | HR 97 | Ht 65.0 in | Wt 155.8 lb

## 2015-08-21 DIAGNOSIS — I10 Essential (primary) hypertension: Secondary | ICD-10-CM | POA: Diagnosis not present

## 2015-08-21 DIAGNOSIS — I519 Heart disease, unspecified: Secondary | ICD-10-CM

## 2015-08-21 DIAGNOSIS — I481 Persistent atrial fibrillation: Secondary | ICD-10-CM

## 2015-08-21 DIAGNOSIS — E785 Hyperlipidemia, unspecified: Secondary | ICD-10-CM

## 2015-08-21 DIAGNOSIS — I4819 Other persistent atrial fibrillation: Secondary | ICD-10-CM

## 2015-08-21 DIAGNOSIS — R0609 Other forms of dyspnea: Secondary | ICD-10-CM

## 2015-08-21 DIAGNOSIS — I5189 Other ill-defined heart diseases: Secondary | ICD-10-CM

## 2015-08-21 NOTE — Patient Instructions (Signed)
Your physician wants you to follow-up in: 6 months with Dr. Hilty. You will receive a reminder letter in the mail two months in advance. If you don't receive a letter, please call our office to schedule the follow-up appointment.  Your physician recommends that you continue on your current medications as directed. Please refer to the Current Medication list given to you today.  

## 2015-08-21 NOTE — Progress Notes (Signed)
OFFICE NOTE  Chief Complaint:  Short of breath with exertion - stable  Primary Care Physician: Carmin Richmond, MD  HPI:  Kendra Hughes is an 82 year old female who I've been following for some time with a history of paroxysmal atrial fibrillation. Despite her CHADS2 score of 2, she has refused warfarin anticoagulation in the past, opting for aspirin which are documented extensively he is insufficient to protect her from stroke. She had worsening shortness of breath and apparently she and her husband tried to call the office to schedule an appointment with me but could not get an appointment.  When I saw her in the office a few months ago, she was notably breathless and can hardly finish sentences. An EKG demonstrated atrial fibrillation with rapid ventricular response at a rate of 173. She did appear somewhat dizzy and her blood pressure was low around 100 systolic.  I admitted her to the hospital and she was an inpatient for 1 week.  She was diuresed and had better rate control of her A. fib. She did undergo a TEE, which demonstrated a left atrial appendage thrombus. Based on that she could not be cardioverted and is recommended that she remain on warfarin with therapeutic INRs for at least one month.  Discharge weight was 168 and she is 166 pounds today.  She does report some mild edema which is dependent in the right leg but none in the left but does improve with elevation at night.  After her last office visit, recommended repeat TEE which demonstrated resolution of her left atrial appendage thrombus. She then underwent elective cardioversion which was successful in converting her back to sinus rhythm. After she returned today I asked her how she was feeling and she reports that she feels "great". I asked her she was feeling any palpitations or irregularity to her heart beat and she denied it. I then presented her with her EKG which demonstrates that she has gone back into atrial fibrillation,  suggesting that she is not symptomatic from A. fib and generally unaware of it. She does report that she takes her blood pressure and pulse every day and noted that after about 5 days of her heart rate being in the mid 50s it increased up into the 70s and 80s, signifying that is probably when she went back into A. Fib.  She's had marked improvement in her swelling and is now down 10 pounds. She's been wearing her lower extremity compression stockings with marked improvement. She had recent laboratory work from her primary care provider which showed an elevated triglyceride level of 253, total cholesterol 154, HDL 36 and LDL 67. It should be noted that these were nonfasting labs. There was a increase in her Lipitor to 20 mg daily.  I saw Kendra Hughes back in the office today. She reports a big improvement in her shortness of breath however she still has persistent shortness of breath with exertion. Her weight is down now over 15 pounds with diuresis and she remains euvolemic. There is no evidence of any peripheral edema. She does report of breathing is better than it had been in the past but she still says she gets short of breath with walking long distances.   Kendra Hughes seems to be doing very well. In the office today she reports stable shortness of breath. She is asking to come off some medicines if possible. She remains in A. fib with a slow ventricular response. Her INR has been therapeutic and followed by  her primary. She recently had lab work including a lipid profile. Her triglycerides were elevated in the 300s however this was nonfasting.  Kendra Hughes returns today for follow-up. She denies any worsening chest pain or worsening shortness of breath. Her weight is been fairly stable. Her swelling is stable. She is therapeutic with her INR. Blood pressure is at goal. She has persistent A. fib and his rate controlled at 97. She recently had lab work which shows excellent cholesterol control with total cholesterol 124,  triglycerides 84, HDL 50 and LDL 57. Renal function is normal with creatinine of 0.8. CBC is also normal and vitamin D was normal at 49 on replacement therapy. ALT is mildly elevated at 50.  PMHx:  Past Medical History  Diagnosis Date  . PAF (paroxysmal atrial fibrillation) (HCC)   . Hypertension   . Dyslipidemia   . DOE (dyspnea on exertion)   . History of nuclear stress test 10/2009    normal exam    Past Surgical History  Procedure Laterality Date  . Abdominal hysterectomy    . Biopsy breast      right  . Transthoracic echocardiogram  07/22/2012    EF 55-60%; mild AV regurg; mild MR; mild-mod TR  . Tee without cardioversion N/A 11/01/2013    Procedure: TRANSESOPHAGEAL ECHOCARDIOGRAM (TEE);  Surgeon: Chrystie Nose, MD;  Location: [facility] ENDOSCOPY;  Service: Cardiovascular;  Laterality: N/A;  . Cardioversion N/A 11/01/2013    Procedure: CARDIOVERSION;  Surgeon: Chrystie Nose, MD;  Location: [facility] ENDOSCOPY;  Service: Cardiovascular;  Laterality: N/A;    FAMHx:  No family history on file.  SOCHx:   reports that she has never smoked. She has never used smokeless tobacco. She reports that she does not drink alcohol or use illicit drugs.  ALLERGIES:  No Known Allergies  ROS: A comprehensive review of systems was negative except for: Respiratory: positive for dyspnea on exertion  HOME MEDS: Current Outpatient Prescriptions  Medication Sig Dispense Refill  . atorvastatin (LIPITOR) 20 MG tablet Take 20 mg by mouth daily.    [name] diltiazem (CARDIZEM CD) 120 MG 24 hr capsule Take 1 capsule (120 mg total) by mouth daily. 90 capsule 3  . ergocalciferol (DRISDOL) 50000 units capsule Take 1 tablet by mouth once a week.    [name] Fexofenadine HCl (ALLEGRA PO) Take 1 tablet by mouth as needed (allergies).     . Fluticasone Propionate (FLONASE NA) Place 1 spray into the nose as needed (allergies).     . folic acid (FOLVITE) 800 MCG tablet Take 800 mcg by mouth daily.    . furosemide (LASIX) 40 MG  tablet Take 1 tablet (40 mg total) by mouth daily. 30 tablet 3  . metoprolol (LOPRESSOR) 50 MG tablet Take 1 tablet (50 mg total) by mouth 2 (two) times daily. 180 tablet 3  . warfarin (COUMADIN) 3 MG tablet Take 1 tablet by mouth daily. Or as directed by INR.     No current facility-administered medications for this visit.    LABS/IMAGING: No results found for this or any previous visit (from the past 48 hour(s)). No results found.  VITALS: BP 126/86 mmHg  Pulse 97  Ht 5\' 5"  (1.651 m)  Wt 155 lb 12.8 oz (70.67 kg)  BMI 25.93 kg/m2  EXAM: General appearance: alert and no distress Neck: no carotid bruit and no JVD Lungs: clear to auscultation bilaterally Heart: irregularly irregular rhythm Abdomen: soft, non-tender; bowel sounds normal; no masses,  no organomegaly Extremities: extremities normal,  atraumatic, no cyanosis or edema Pulses: 2+ and symmetric . Skin: Skin color, texture, turgor normal. No rashes or lesions Neurologic: Grossly normal Psych: Normal  EKG: Atrial fibrillation with controlled ventricular response at 97  ASSESSMENT: 1. Chronic diastolic heart failure 2. Persistent atrial fibrillation - with slow ventricular response 3. 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 Mild AI, mild to moderate AR, EF 50-55% 5. CHADSVASC 5-on chronic warfarin  PLAN: 1.   Kendra Hughes has been stable with persistent atrial fibrillation. She is asymptomatic with this. She denies any worsening shortness of breath and has no signs of worsening diastolic heart failure. Blood pressure is well-controlled. She will be due for repeat assessment of EF and valvular heart disease next year. Her INR is been stable.  Plan to see her back in 6 months.  Chrystie Nose, MD, [facility] Attending Cardiologist [facility]  Chrystie Nose 08/21/2015, 6:41 PM

## 2015-11-09 ENCOUNTER — Telehealth: Payer: Self-pay | Admitting: Internal Medicine

## 2015-11-09 DIAGNOSIS — S72002A Fracture of unspecified part of neck of left femur, initial encounter for closed fracture: Secondary | ICD-10-CM

## 2015-11-09 HISTORY — DX: Fracture of unspecified part of neck of left femur, initial encounter for closed fracture: S72.002A

## 2015-11-09 NOTE — Telephone Encounter (Signed)
[facility].  Left hip fx.  EDP has already spoken with Dr. Magnus Ivan.  Accepted patient to med-surg bed.  INR 2.3 patient is on coumadin for chronic A.Fib.  WBC 15 but ROS is otherwise negative.  Patient is actually decent historian.  Magnus Ivan said surgery Sat at the earliest.

## 2015-11-10 ENCOUNTER — Observation Stay (HOSPITAL_COMMUNITY): Payer: Medicare Other

## 2015-11-10 ENCOUNTER — Inpatient Hospital Stay (HOSPITAL_COMMUNITY)
Admission: EM | Admit: 2015-11-10 | Discharge: 2015-11-16 | DRG: 470 | Disposition: A | Discharge status: Rehabilitation Facility | Payer: Medicare Other | Source: Other Acute Inpatient Hospital | Attending: Internal Medicine | Admitting: Internal Medicine

## 2015-11-10 ENCOUNTER — Encounter (HOSPITAL_COMMUNITY): Payer: Self-pay | Admitting: Internal Medicine

## 2015-11-10 DIAGNOSIS — G8918 Other acute postprocedural pain: Secondary | ICD-10-CM | POA: Diagnosis not present

## 2015-11-10 DIAGNOSIS — R000 Tachycardia, unspecified: Secondary | ICD-10-CM

## 2015-11-10 DIAGNOSIS — L03113 Cellulitis of right upper limb: Secondary | ICD-10-CM | POA: Diagnosis not present

## 2015-11-10 DIAGNOSIS — Z9841 Cataract extraction status, right eye: Secondary | ICD-10-CM

## 2015-11-10 DIAGNOSIS — I9589 Other hypotension: Secondary | ICD-10-CM | POA: Insufficient documentation

## 2015-11-10 DIAGNOSIS — R339 Retention of urine, unspecified: Secondary | ICD-10-CM | POA: Diagnosis not present

## 2015-11-10 DIAGNOSIS — S72001F Fracture of unspecified part of neck of right femur, subsequent encounter for open fracture type IIIA, IIIB, or IIIC with routine healing: Secondary | ICD-10-CM | POA: Diagnosis not present

## 2015-11-10 DIAGNOSIS — S72002D Fracture of unspecified part of neck of left femur, subsequent encounter for closed fracture with routine healing: Secondary | ICD-10-CM | POA: Diagnosis not present

## 2015-11-10 DIAGNOSIS — Z9842 Cataract extraction status, left eye: Secondary | ICD-10-CM

## 2015-11-10 DIAGNOSIS — S72002A Fracture of unspecified part of neck of left femur, initial encounter for closed fracture: Principal | ICD-10-CM | POA: Diagnosis present

## 2015-11-10 DIAGNOSIS — E559 Vitamin D deficiency, unspecified: Secondary | ICD-10-CM | POA: Insufficient documentation

## 2015-11-10 DIAGNOSIS — I10 Essential (primary) hypertension: Secondary | ICD-10-CM | POA: Diagnosis present

## 2015-11-10 DIAGNOSIS — Z7901 Long term (current) use of anticoagulants: Secondary | ICD-10-CM

## 2015-11-10 DIAGNOSIS — R251 Tremor, unspecified: Secondary | ICD-10-CM | POA: Diagnosis not present

## 2015-11-10 DIAGNOSIS — K5900 Constipation, unspecified: Secondary | ICD-10-CM | POA: Diagnosis not present

## 2015-11-10 DIAGNOSIS — Z961 Presence of intraocular lens: Secondary | ICD-10-CM | POA: Diagnosis present

## 2015-11-10 DIAGNOSIS — I5033 Acute on chronic diastolic (congestive) heart failure: Secondary | ICD-10-CM | POA: Diagnosis not present

## 2015-11-10 DIAGNOSIS — Y92009 Unspecified place in unspecified non-institutional (private) residence as the place of occurrence of the external cause: Secondary | ICD-10-CM

## 2015-11-10 DIAGNOSIS — I482 Chronic atrial fibrillation: Secondary | ICD-10-CM | POA: Diagnosis present

## 2015-11-10 DIAGNOSIS — Z8249 Family history of ischemic heart disease and other diseases of the circulatory system: Secondary | ICD-10-CM

## 2015-11-10 DIAGNOSIS — M1711 Unilateral primary osteoarthritis, right knee: Secondary | ICD-10-CM | POA: Diagnosis present

## 2015-11-10 DIAGNOSIS — G4700 Insomnia, unspecified: Secondary | ICD-10-CM | POA: Diagnosis not present

## 2015-11-10 DIAGNOSIS — Z9181 History of falling: Secondary | ICD-10-CM | POA: Diagnosis not present

## 2015-11-10 DIAGNOSIS — M069 Rheumatoid arthritis, unspecified: Secondary | ICD-10-CM | POA: Diagnosis present

## 2015-11-10 DIAGNOSIS — D62 Acute posthemorrhagic anemia: Secondary | ICD-10-CM | POA: Diagnosis not present

## 2015-11-10 DIAGNOSIS — I481 Persistent atrial fibrillation: Secondary | ICD-10-CM | POA: Diagnosis present

## 2015-11-10 DIAGNOSIS — I5032 Chronic diastolic (congestive) heart failure: Secondary | ICD-10-CM | POA: Diagnosis present

## 2015-11-10 DIAGNOSIS — Z96649 Presence of unspecified artificial hip joint: Secondary | ICD-10-CM | POA: Insufficient documentation

## 2015-11-10 DIAGNOSIS — I9581 Postprocedural hypotension: Secondary | ICD-10-CM | POA: Diagnosis not present

## 2015-11-10 DIAGNOSIS — E785 Hyperlipidemia, unspecified: Secondary | ICD-10-CM | POA: Diagnosis present

## 2015-11-10 DIAGNOSIS — I110 Hypertensive heart disease with heart failure: Secondary | ICD-10-CM | POA: Diagnosis present

## 2015-11-10 DIAGNOSIS — Z9660 Presence of unspecified orthopedic joint implant: Secondary | ICD-10-CM | POA: Diagnosis not present

## 2015-11-10 DIAGNOSIS — S72002S Fracture of unspecified part of neck of left femur, sequela: Secondary | ICD-10-CM | POA: Diagnosis not present

## 2015-11-10 DIAGNOSIS — I4819 Other persistent atrial fibrillation: Secondary | ICD-10-CM | POA: Diagnosis present

## 2015-11-10 DIAGNOSIS — E871 Hypo-osmolality and hyponatremia: Secondary | ICD-10-CM | POA: Diagnosis not present

## 2015-11-10 DIAGNOSIS — I959 Hypotension, unspecified: Secondary | ICD-10-CM | POA: Diagnosis not present

## 2015-11-10 DIAGNOSIS — W1830XA Fall on same level, unspecified, initial encounter: Secondary | ICD-10-CM | POA: Diagnosis present

## 2015-11-10 DIAGNOSIS — S72002C Fracture of unspecified part of neck of left femur, initial encounter for open fracture type IIIA, IIIB, or IIIC: Secondary | ICD-10-CM | POA: Diagnosis not present

## 2015-11-10 DIAGNOSIS — I4891 Unspecified atrial fibrillation: Secondary | ICD-10-CM | POA: Diagnosis not present

## 2015-11-10 HISTORY — DX: Acute embolism and thrombosis of unspecified deep veins of unspecified lower extremity: I82.409

## 2015-11-10 HISTORY — DX: Chronic diastolic (congestive) heart failure: I50.32

## 2015-11-10 HISTORY — DX: Fracture of unspecified part of neck of left femur, initial encounter for closed fracture: S72.002A

## 2015-11-10 HISTORY — DX: Rheumatoid arthritis, unspecified: M06.9

## 2015-11-10 HISTORY — DX: Unspecified osteoarthritis, unspecified site: M19.90

## 2015-11-10 LAB — SURGICAL PCR SCREEN
MRSA, PCR: NEGATIVE
Staphylococcus aureus: NEGATIVE

## 2015-11-10 LAB — TYPE AND SCREEN
ABO/RH(D): O POS
ANTIBODY SCREEN: NEGATIVE

## 2015-11-10 LAB — CBC
HCT: 38.7 % (ref 36.0–46.0)
Hemoglobin: 12.6 g/dL (ref 12.0–15.0)
MCH: 29.9 pg (ref 26.0–34.0)
MCHC: 32.6 g/dL (ref 30.0–36.0)
MCV: 91.9 fL (ref 78.0–100.0)
PLATELETS: 212 10*3/uL (ref 150–400)
RBC: 4.21 MIL/uL (ref 3.87–5.11)
RDW: 15.9 % — AB (ref 11.5–15.5)
WBC: 12.4 10*3/uL — ABNORMAL HIGH (ref 4.0–10.5)

## 2015-11-10 LAB — BASIC METABOLIC PANEL
Anion gap: 10 (ref 5–15)
BUN: 17 mg/dL (ref 6–20)
CALCIUM: 9.2 mg/dL (ref 8.9–10.3)
CO2: 27 mmol/L (ref 22–32)
Chloride: 99 mmol/L — ABNORMAL LOW (ref 101–111)
Creatinine, Ser: 0.81 mg/dL (ref 0.44–1.00)
GFR calc Af Amer: >60 mL/min (ref >60)
Glucose, Bld: 144 mg/dL — ABNORMAL HIGH (ref 65–99)
POTASSIUM: 4.5 mmol/L (ref 3.5–5.1)
SODIUM: 136 mmol/L (ref 135–145)

## 2015-11-10 LAB — PROTIME-INR
INR: 2.13 — AB (ref 0.00–1.49)
INR: 2.14 — AB (ref 0.00–1.49)
PROTHROMBIN TIME: 23.7 s — AB (ref 11.6–15.2)
Prothrombin Time: 23.8 seconds — ABNORMAL HIGH (ref 11.6–15.2)

## 2015-11-10 LAB — ABO/RH: ABO/RH(D): O POS

## 2015-11-10 LAB — BRAIN NATRIURETIC PEPTIDE: B Natriuretic Peptide: 245.9 pg/mL — ABNORMAL HIGH (ref 0.0–100.0)

## 2015-11-10 LAB — GLUCOSE, CAPILLARY: GLUCOSE-CAPILLARY: 152 mg/dL — AB (ref 65–99)

## 2015-11-10 LAB — APTT: aPTT: 38 seconds — ABNORMAL HIGH (ref 24–37)

## 2015-11-10 MED ORDER — CETYLPYRIDINIUM CHLORIDE 0.05 % MT LIQD
7.0000 mL | Freq: Two times a day (BID) | OROMUCOSAL | Status: DC
  Administered 2015-11-10 – 2015-11-15 (×8): 7 mL via OROMUCOSAL

## 2015-11-10 MED ORDER — OXYCODONE-ACETAMINOPHEN 5-325 MG PO TABS
1.0000 | ORAL_TABLET | ORAL | Status: DC | PRN
  Administered 2015-11-10 – 2015-11-14 (×3): 1 via ORAL
  Filled 2015-11-10 (×3): qty 1

## 2015-11-10 MED ORDER — FOLIC ACID 1 MG PO TABS
1.0000 mg | ORAL_TABLET | Freq: Every day | ORAL | Status: DC
  Administered 2015-11-10 – 2015-11-16 (×6): 1 mg via ORAL
  Filled 2015-11-10 (×6): qty 1

## 2015-11-10 MED ORDER — VITAMIN D (ERGOCALCIFEROL) 1.25 MG (50000 UNIT) PO CAPS
50000.0000 [IU] | ORAL_CAPSULE | ORAL | Status: DC
  Filled 2015-11-10: qty 1

## 2015-11-10 MED ORDER — LORATADINE 10 MG PO TABS
10.0000 mg | ORAL_TABLET | Freq: Every day | ORAL | Status: DC
  Administered 2015-11-15 – 2015-11-16 (×2): 10 mg via ORAL
  Filled 2015-11-10 (×6): qty 1

## 2015-11-10 MED ORDER — METOPROLOL TARTRATE 50 MG PO TABS
50.0000 mg | ORAL_TABLET | Freq: Two times a day (BID) | ORAL | Status: DC
Start: 2015-11-10 — End: 2015-11-12
  Administered 2015-11-10 – 2015-11-11 (×3): 50 mg via ORAL
  Filled 2015-11-10 (×4): qty 1

## 2015-11-10 MED ORDER — SENNOSIDES-DOCUSATE SODIUM 8.6-50 MG PO TABS: 1.0000 | ORAL_TABLET | Freq: Every evening | ORAL | Status: DC | PRN

## 2015-11-10 MED ORDER — HEPARIN SODIUM (PORCINE) 5000 UNIT/ML IJ SOLN: 5000.0000 [IU] | Freq: Three times a day (TID) | INTRAMUSCULAR | Status: DC

## 2015-11-10 MED ORDER — DILTIAZEM HCL ER COATED BEADS 120 MG PO CP24
120.0000 mg | ORAL_CAPSULE | Freq: Every day | ORAL | Status: DC
  Administered 2015-11-10: 120 mg via ORAL
  Filled 2015-11-10 (×2): qty 1

## 2015-11-10 MED ORDER — MORPHINE SULFATE (PF) 2 MG/ML IV SOLN
1.0000 mg | INTRAVENOUS | Status: DC | PRN
  Administered 2015-11-10: 1 mg via INTRAVENOUS
  Filled 2015-11-10: qty 1

## 2015-11-10 MED ORDER — WHITE PETROLATUM GEL
Status: AC
  Administered 2015-11-10: 0.2
  Filled 2015-11-10: qty 1

## 2015-11-10 MED ORDER — FLUTICASONE PROPIONATE 50 MCG/ACT NA SUSP
1.0000 | Freq: Every day | NASAL | Status: DC
  Administered 2015-11-15: 1 via NASAL
  Filled 2015-11-10: qty 16

## 2015-11-10 MED ORDER — ATORVASTATIN CALCIUM 10 MG PO TABS
10.0000 mg | ORAL_TABLET | Freq: Every day | ORAL | Status: DC
  Administered 2015-11-10 – 2015-11-15 (×6): 10 mg via ORAL
  Filled 2015-11-10 (×6): qty 1

## 2015-11-10 MED ORDER — PHYTONADIONE 5 MG PO TABS
5.0000 mg | ORAL_TABLET | Freq: Once | ORAL | Status: AC
  Administered 2015-11-10: 5 mg via ORAL
  Filled 2015-11-10 (×2): qty 1

## 2015-11-10 MED ORDER — ATORVASTATIN CALCIUM 20 MG PO TABS: 20.0000 mg | ORAL_TABLET | Freq: Every day | ORAL | Status: DC

## 2015-11-10 MED ORDER — FUROSEMIDE 40 MG PO TABS
40.0000 mg | ORAL_TABLET | Freq: Every day | ORAL | Status: DC
  Administered 2015-11-10 – 2015-11-12 (×2): 40 mg via ORAL
  Filled 2015-11-10 (×2): qty 1

## 2015-11-10 MED ORDER — PHYTONADIONE 5 MG PO TABS
10.0000 mg | ORAL_TABLET | Freq: Once | ORAL | Status: AC
  Administered 2015-11-10: 10 mg via ORAL
  Filled 2015-11-10: qty 2

## 2015-11-10 NOTE — Consult Note (Signed)
Reason for Consult:  Left hip fracture Referring Physician: ED at [facility] in [place].  Kendra Hughes is an 82 y.o. female.  HPI:   82 yo female who sustained a left hip femoral neck fracture after an accidental mechanical fall.  Was seen at an outside hospital with significant left hip pain and the inability to ambulate.  Was found to have an acute left hip fracture and was transferred to [facility] for definitive treatment per her request since she sees a heart specialist here.  She does report significant left hip pain and denies other injuries.  Past Medical History  Diagnosis Date  . PAF (paroxysmal atrial fibrillation) ([place])   . Hypertension   . Dyslipidemia   . DOE (dyspnea on exertion)   . History of nuclear stress test 10/2009    normal exam  . Chronic diastolic (congestive) heart failure [facility])     Past Surgical History  Procedure Laterality Date  . Abdominal hysterectomy    . Biopsy breast      right  . Transthoracic echocardiogram  07/22/2012    EF 55-60%; mild AV regurg; mild MR; mild-mod TR  . Tee without cardioversion N/A 11/01/2013    Procedure: TRANSESOPHAGEAL ECHOCARDIOGRAM (TEE);  Surgeon: Pixie Casino, MD;  Location: [facility] ENDOSCOPY;  Service: Cardiovascular;  Laterality: N/A;  . Cardioversion N/A 11/01/2013    Procedure: CARDIOVERSION;  Surgeon: Pixie Casino, MD;  Location: [facility] ENDOSCOPY;  Service: Cardiovascular;  Laterality: N/A;    Family History  Problem Relation Age of Onset  . Heart disease Mother     Social History:  reports that she has never smoked. She has never used smokeless tobacco. She reports that she does not drink alcohol or use illicit drugs.  Allergies: No Known Allergies  Medications: I have reviewed the patient's current medications.  Results for orders placed or performed during the hospital encounter of 11/10/15 (from the past 48 hour(s))  Protime-INR     Status: Abnormal   Collection Time: 11/10/15  4:41 AM  Result Value  Ref Range   Prothrombin Time 23.7 (H) 11.6 - 15.2 seconds   INR 2.13 (H) 0.00 - 1.49  APTT     Status: Abnormal   Collection Time: 11/10/15  4:41 AM  Result Value Ref Range   aPTT 38 (H) 24 - 37 seconds    Comment:        IF BASELINE aPTT IS ELEVATED, SUGGEST PATIENT RISK ASSESSMENT BE USED TO DETERMINE APPROPRIATE ANTICOAGULANT THERAPY.   Brain natriuretic peptide     Status: Abnormal   Collection Time: 11/10/15  4:41 AM  Result Value Ref Range   B Natriuretic Peptide 245.9 (H) 0.0 - 100.0 pg/mL  CBC     Status: Abnormal   Collection Time: 11/10/15  4:41 AM  Result Value Ref Range   WBC 12.4 (H) 4.0 - 10.5 K/uL   RBC 4.21 3.87 - 5.11 MIL/uL   Hemoglobin 12.6 12.0 - 15.0 g/dL   HCT 38.7 36.0 - 46.0 %   MCV 91.9 78.0 - 100.0 fL   MCH 29.9 26.0 - 34.0 pg   MCHC 32.6 30.0 - 36.0 g/dL   RDW 15.9 (H) 11.5 - 15.5 %   Platelets 212 150 - 400 K/uL  Basic metabolic panel     Status: Abnormal   Collection Time: 11/10/15  4:41 AM  Result Value Ref Range   Sodium 136 135 - 145 mmol/L   Potassium 4.5 3.5 - 5.1 mmol/L  Chloride 99 (L) 101 - 111 mmol/L   CO2 27 22 - 32 mmol/L   Glucose, Bld 144 (H) 65 - 99 mg/dL   BUN 17 6 - 20 mg/dL   Creatinine, Ser 0.81 0.44 - 1.00 mg/dL   Calcium 9.2 8.9 - 10.3 mg/dL   GFR calc non Af Amer >60 >60 mL/min   GFR calc Af Amer >60 >60 mL/min    Comment: (NOTE) The eGFR has been calculated using the CKD EPI equation. This calculation has not been validated in all clinical situations. eGFR's persistently <60 mL/min signify possible Chronic Kidney Disease.    Anion gap 10 5 - 15  Type and screen [place]     Status: None   Collection Time: 11/10/15  4:45 AM  Result Value Ref Range   ABO/RH(D) O POS    Antibody Screen NEG    Sample Expiration 11/13/2015   ABO/Rh     Status: None   Collection Time: 11/10/15  4:45 AM  Result Value Ref Range   ABO/RH(D) O POS   Glucose, capillary     Status: Abnormal   Collection Time:  11/10/15  6:13 AM  Result Value Ref Range   Glucose-Capillary 152 (H) 65 - 99 mg/dL    Dg Hip Port Unilat With Pelvis 1v Left  11/10/2015  CLINICAL DATA:  Closed left hip fracture, initial visit. EXAM: DG HIP (WITH OR WITHOUT PELVIS) 1V PORT LEFT COMPARISON:  Left hip series of Nov 09, 2015 at 2200 hours. FINDINGS: The bony pelvis is diffusely osteopenic. No acute pelvic fracture is observed. There is an acute transverse subcapital fracture of the left hip with varus angulation. There is persistent superior migration of the femoral neck and a remainder of the femur superiorly with respect to the femoral head and acetabulum. There is narrowing of the left hip joint space similar to that seen on the right. IMPRESSION: Acute subcapsular fracture of the left hip with superior migration of the left hip and femur. Electronically Signed   By: David  Martinique M.D.   On: 11/10/2015 08:29    Review of Systems  All other systems reviewed and are negative.  Blood pressure 113/43, pulse 114, temperature 97.4 F (36.3 C), temperature source Oral, resp. rate 18, height '5\' 5"'$  (1.651 m), weight 70.308 kg (155 lb), SpO2 98 %. Physical Exam  Constitutional: She is oriented to person, place, and time. She appears well-developed and well-nourished.  HENT:  Head: Normocephalic and atraumatic.  Eyes: Pupils are equal, round, and reactive to light.  Neck: Normal range of motion. Neck supple.  Cardiovascular: Normal rate.   Respiratory: Effort normal.  GI: Bowel sounds are normal.  Musculoskeletal:       Left hip: She exhibits decreased range of motion, decreased strength, tenderness and deformity.  Neurological: She is alert and oriented to person, place, and time.  Skin: Skin is warm and dry.  Psychiatric: She has a normal mood and affect.    Assessment/Plan: Left hip with displaced femoral neck fracture 1)  I spoke to her in length and have recommended a left hip hemiarthroplasty vs a total hip replacement to  treat this type of fracture.  She mobilizes independently and this surgery will help decrease her pain and improve her mobility.  Risks and benefits have been discussed in detail.  Surgery is delayed until tomorrow morning due to her INR being over 2.  Jonathan Kirkendoll Y 11/10/2015, 2:56 PM

## 2015-11-10 NOTE — Anesthesia Preprocedure Evaluation (Addendum)
Anesthesia Evaluation  Patient identified by MRN, date of birth, ID band Patient awake    Reviewed: Allergy & Precautions, H&P , NPO status , Patient's Chart, lab work & pertinent test results  History of Anesthesia Complications Negative for: history of anesthetic complications  Airway Mallampati: II   Neck ROM: full    Dental   Pulmonary shortness of breath,    breath sounds clear to auscultation       Cardiovascular hypertension, Pt. on medications +CHF and + DOE  + dysrhythmias Atrial Fibrillation  Rhythm:Irregular Rate:Tachycardia  08/2013 echo:  EF 50-55% Mild AI; Mild MR   Neuro/Psych negative neurological ROS  negative psych ROS   GI/Hepatic negative GI ROS, Neg liver ROS,   Endo/Other  negative endocrine ROS  Renal/GU negative Renal ROS     Musculoskeletal negative musculoskeletal ROS (+)   Abdominal   Peds  Hematology negative hematology ROS (+)   Anesthesia Other Findings   Reproductive/Obstetrics                          Anesthesia Physical  Anesthesia Plan  ASA: III and emergent  Anesthesia Plan: General   Post-op Pain Management: GA combined w/ Regional for post-op pain   Induction:   Airway Management Planned: LMA  Additional Equipment:   Intra-op Plan:   Post-operative Plan: Extubation in OR  Informed Consent: I have reviewed the patients History and Physical, chart, labs and discussed the procedure including the risks, benefits and alternatives for the proposed anesthesia with the patient or authorized representative who has indicated his/her understanding and acceptance.   Dental advisory given  Plan Discussed with: CRNA  Anesthesia Plan Comments:      Anesthesia Quick Evaluation

## 2015-11-10 NOTE — Progress Notes (Signed)
PT Cancellation Note  Patient Details Name: Kendra Hughes MRN: [id number] DOB: 05/21/1923   Cancelled Treatment:    Reason Eval/Treat Not Completed: Medical issues which prohibited therapy (pt on bedrest and awaiting sx tomorrow morning. Will see post op as orders allow)   Toney Sang Beth 11/10/2015, 7:06 AM Delaney Meigs, PT [phone]

## 2015-11-10 NOTE — Progress Notes (Signed)
PROGRESS NOTE                                                                                                                                                                                                             Patient Demographics:    Kendra Hughes, is a 82 y.o. female, DOB - 29-Apr-1923, MMH:[id number]  Admit date - 11/10/2015   Admitting Physician Lorretta Harp, MD  Outpatient Primary MD for the patient is Carmin Richmond, MD  LOS - 0  Outpatient Specialists: Dr. Rennis Golden (cardiology)  No chief complaint on file.      Brief Narrative   82 year old female with history of hypertension, hyperlipidemia, chronic A. fib on Coumadin, diastolic CHF, vitamin D deficiency presented with fall at home with pain in her left hip. Patient reports getting out of shower and trying to get out of her towel when she fell backwards and landed on her hip. Denied loss of consciousness or sustaining any other injuries. Patient reports that she has unsteady gait and does tend to sway backwards but holds on to things.  In the ED patient's vitals were stable.  WBC of 15.6 with normal electrolytes and INR of 2.2. X-ray of the left hip showed acute cancer fracture of left femoral neck with varus angulation. Orthopedic surgery consulted with plan on surgery on 5/6.   Subjective:   Patient reports her left hip pain to be stable when it is kept still.   Assessment  & Plan :    Principal Problem:   Closed left hip fracture (HCC) Pain control with Percocet and when necessary morphine. Seen by orthopedic surgery with plan on OR tomorrow morning. Nothing by mouth after midnight. Held Coumadin. Given her dose of vitamin K for reversal. Patient does not have any signs or symptoms of CHF, chest pain or dizziness. She is on beta blocker which is continued perioperatively. TEE from 2015 shows EF of 55-60%. Patient has moderate risk for postoperative cardiac  complications. No preoperative cardiac workup recommended.   Active Problems:   Persistent atrial fibrillation (HCC) Monitor on telemetry. Holding Coumadin for surgery. Patient is at high fall risk and reports having unsteady gait and tendency to fall backwards. She does not use a walker and I have instructed that she needs to start using a walker even after she completes physical hip surgery.  HTN (hypertension) Stable. Continue Lasix, metoprolol and Cardizem.    Hyperlipidemia Continue Lipitor    Chronic diastolic (congestive) heart failure (HCC) Euvolemic. Continue Lasix.  Leukocytosis Possibly reactive. Repeat WBC improved     Code Status : Full code  Family Communication  : Husband and son at bedside  Disposition Plan  : Needs skilled nursing facility upon discharge. Possibly on 5/8 5/9  Barriers For Discharge :  Pending hip surgery  Consults  :   Orthopedics (Dr. Magnus Ivan)  Procedures  :  None  DVT Prophylaxis  :  Therapeutic INR on Coumadin  Lab Results  Component Value Date   PLT 212 11/10/2015    Antibiotics  :    Anti-infectives    None        Objective:   Filed Vitals:   11/10/15 0100 11/10/15 0500 11/10/15 0800  BP: 113/52 113/43   Pulse: 100 114   Temp: 98.4 F (36.9 C) 97.4 F (36.3 C)   TempSrc: Oral Oral   Resp: 18 18   Height:   5\' 5"  (1.651 m)  Weight:   70.308 kg (155 lb)  SpO2: 95% 98%     Wt Readings from Last 3 Encounters:  11/10/15 70.308 kg (155 lb)  08/21/15 70.67 kg (155 lb 12.8 oz)  02/17/15 67.813 kg (149 lb 8 oz)    No intake or output data in the 24 hours ending 11/10/15 1122   Physical Exam  Gen: not in distress HEENT: no pallor, moist mucosa, supple neck Chest: clear b/l, no added sounds CVS: S1 and S2 irregularly irregular, no murmurs rub or gallop GI: soft, NT, ND, BS+ Musculoskeletal: warm, no edema, limited mobility of left hip CNS: Alert and oriented, nonfocal    Data Review:     CBC  Recent Labs Lab 11/10/15 0441  WBC 12.4*  HGB 12.6  HCT 38.7  PLT 212  MCV 91.9  MCH 29.9  MCHC 32.6  RDW 15.9*    Chemistries   Recent Labs Lab 11/10/15 0441  NA 136  K 4.5  CL 99*  CO2 27  GLUCOSE 144*  BUN 17  CREATININE 0.81  CALCIUM 9.2   ------------------------------------------------------------------------------------------------------------------ No results for input(s): CHOL, HDL, LDLCALC, TRIG, CHOLHDL, LDLDIRECT in the last 72 hours.  No results found for: HGBA1C ------------------------------------------------------------------------------------------------------------------ No results for input(s): TSH, T4TOTAL, T3FREE, THYROIDAB in the last 72 hours.  Invalid input(s): FREET3 ------------------------------------------------------------------------------------------------------------------ No results for input(s): VITAMINB12, FOLATE, FERRITIN, TIBC, IRON, RETICCTPCT in the last 72 hours.  Coagulation profile  Recent Labs Lab 11/10/15 0441  INR 2.13*    No results for input(s): DDIMER in the last 72 hours.  Cardiac Enzymes No results for input(s): CKMB, TROPONINI, MYOGLOBIN in the last 168 hours.  Invalid input(s): CK ------------------------------------------------------------------------------------------------------------------    Component Value Date/Time   BNP 245.9* 11/10/2015 0441    Inpatient Medications  Scheduled Meds: . atorvastatin  10 mg Oral q1800  . diltiazem  120 mg Oral Daily  . fluticasone  1 spray Each Nare Daily  . folic acid  1 mg Oral Daily  . furosemide  40 mg Oral Daily  . heparin  5,000 Units Subcutaneous Q8H  . loratadine  10 mg Oral Daily  . metoprolol  50 mg Oral BID  . [START ON 11/11/2015] Vitamin D (Ergocalciferol)  50,000 Units Oral Weekly   Continuous Infusions:  PRN Meds:.morphine injection, oxyCODONE-acetaminophen, senna-docusate  Micro Results No results found for this or any  previous visit (from the past 240  hour(s)).  Radiology Reports Dg Hip Port Unilat With Pelvis 1v Left  11/10/2015  CLINICAL DATA:  Closed left hip fracture, initial visit. EXAM: DG HIP (WITH OR WITHOUT PELVIS) 1V PORT LEFT COMPARISON:  Left hip series of Nov 09, 2015 at 2200 hours. FINDINGS: The bony pelvis is diffusely osteopenic. No acute pelvic fracture is observed. There is an acute transverse subcapital fracture of the left hip with varus angulation. There is persistent superior migration of the femoral neck and a remainder of the femur superiorly with respect to the femoral head and acetabulum. There is narrowing of the left hip joint space similar to that seen on the right. IMPRESSION: Acute subcapsular fracture of the left hip with superior migration of the left hip and femur. Electronically Signed   By: David  [place] M.D.   On: 11/10/2015 08:29    Time Spent in minutes  25   Eddie North M.D on 11/10/2015 at 11:22 AM  Between 7am to 7pm - Pager - [phone]  After 7pm go to [email or web address] - password [facility]  [facility] -  Office  [phone]

## 2015-11-10 NOTE — H&P (Signed)
History and Physical    Kendra Hughes PQZ:[id number] DOB: 1922/07/31 DOA: 11/10/2015  Referring MD/NP/PA:   PCP: Carmin Richmond, MD   Outpatient Specialists:none  Patient coming from:  Home   Chief Complaint: left hip pain after fall.  HPI: Kendra Hughes is a 82 y.o. female with medical history significant of hypertension, hyperlipidemia, chronic atrial fibrillation, diastolic congestive heart failure, vitamin D deficiency, who presents with left hip pain after fall.  Pt is transferred from [facility] due to left hip fx.  Patient reports that she had fall when she tried to get towel in shower yesterday. She injured her head and left hip. She developed pain over left hip, which is constant, severe, sharp, nonradiating. She does not have numbness in leg. Patient did not have LOC. She denies chest pain, dizziness, unilateral weakness, nausea, vomiting, abdominal pain, diarrhea, symptoms of UTI.  ED Course: pt was found to have WBC 15.6, temperature normal, electrolytes and renal function okay, negative CT head for acute intracranial abnormalities. X-ray of left hip and x-ray of pelvis showed acute transverse fracture of left femoral neck with varus angulation. Pt is placed to med-surg bed for observation. Orthopedic surgeon, Dr. Magnus Ivan was consulted.  Review of Systems:   General: no fevers, chills, no changes in body weight, has fatigue HEENT: no blurry vision, hearing changes or sore throat Pulm: no dyspnea, coughing, wheezing CV: no chest pain, no palpitations Abd: no nausea, vomiting, abdominal pain, diarrhea, constipation GU: no dysuria, burning on urination, increased urinary frequency, hematuria  Ext: no leg edema Neuro: no unilateral weakness, numbness, or tingling, no vision change or hearing loss Skin: no rash MSK: has left hip pain. Heme: No easy bruising.  Travel history: No recent long distant travel.  Allergy: No Known Allergies  Past Medical History  Diagnosis  Date  . PAF (paroxysmal atrial fibrillation) (HCC)   . Hypertension   . Dyslipidemia   . DOE (dyspnea on exertion)   . History of nuclear stress test 10/2009    normal exam  . Chronic diastolic (congestive) heart failure [facility])     Past Surgical History  Procedure Laterality Date  . Abdominal hysterectomy    . Biopsy breast      right  . Transthoracic echocardiogram  07/22/2012    EF 55-60%; mild AV regurg; mild MR; mild-mod TR  . Tee without cardioversion N/A 11/01/2013    Procedure: TRANSESOPHAGEAL ECHOCARDIOGRAM (TEE);  Surgeon: Chrystie Nose, MD;  Location: [facility] ENDOSCOPY;  Service: Cardiovascular;  Laterality: N/A;  . Cardioversion N/A 11/01/2013    Procedure: CARDIOVERSION;  Surgeon: Chrystie Nose, MD;  Location: [facility] ENDOSCOPY;  Service: Cardiovascular;  Laterality: N/A;    Social History:  reports that she has never smoked. She has never used smokeless tobacco. She reports that she does not drink alcohol or use illicit drugs.  Family History:  Family History  Problem Relation Age of Onset  . Heart disease Mother      Prior to Admission medications   Medication Sig Start Date End Date Taking? Authorizing Provider  atorvastatin (LIPITOR) 20 MG tablet Take 20 mg by mouth daily.    Historical Provider, MD  diltiazem (CARDIZEM CD) 120 MG 24 hr capsule Take 1 capsule (120 mg total) by mouth daily. 02/17/15   Chrystie Nose, MD  ergocalciferol (DRISDOL) 50000 units capsule Take 1 tablet by mouth once a week. 06/22/15 06/21/16  Historical Provider, MD  Fexofenadine HCl (ALLEGRA PO) Take 1 tablet by mouth as  needed (allergies).     Historical Provider, MD  Fluticasone Propionate (FLONASE NA) Place 1 spray into the nose as needed (allergies).     Historical Provider, MD  folic acid (FOLVITE) 800 MCG tablet Take 800 mcg by mouth daily.    Historical Provider, MD  furosemide (LASIX) 40 MG tablet Take 1 tablet (40 mg total) by mouth daily. 08/27/13   Ok Anis, NP  metoprolol  (LOPRESSOR) 50 MG tablet Take 1 tablet (50 mg total) by mouth 2 (two) times daily. 02/17/15   Chrystie Nose, MD  warfarin (COUMADIN) 3 MG tablet Take 1 tablet by mouth daily. Or as directed by INR. 12/06/14   Historical Provider, MD    Physical Exam: There were no vitals filed for this visit. General: Not in acute distress HEENT:       Eyes: PERRL, EOMI, no scleral icterus.       ENT: No discharge from the ears and nose, no pharynx injection, no tonsillar enlargement.        Neck: No JVD, no bruit, no mass felt. Heme: No neck lymph node enlargement. Cardiac: S1/S2, RRR, No murmurs, No gallops or rubs. Pulm: Good air movement bilaterally. No rales, wheezing, rhonchi or rubs. Abd: Soft, nondistended, nontender, no rebound pain, no organomegaly, BS present. GU: No hematuria Ext: No pitting leg edema bilaterally. 2+DP/PT pulse bilaterally. Musculoskeletal: tenderness over left hip. Skin: No rashes.  Neuro: Alert, oriented X3, cranial nerves II-XII grossly intact, moves all extremities normally. Psych: Patient is not psychotic, no suicidal or hemocidal ideation.  Labs on Admission: I have personally reviewed following labs and imaging studies  CBC: No results for input(s): WBC, NEUTROABS, HGB, HCT, MCV, PLT in the last 168 hours. Basic Metabolic Panel: No results for input(s): NA, K, CL, CO2, GLUCOSE, BUN, CREATININE, CALCIUM, MG, PHOS in the last 168 hours. GFR: CrCl cannot be calculated (Unknown ideal weight.). Liver Function Tests: No results for input(s): AST, ALT, ALKPHOS, BILITOT, PROT, ALBUMIN in the last 168 hours. No results for input(s): LIPASE, AMYLASE in the last 168 hours. No results for input(s): AMMONIA in the last 168 hours. Coagulation Profile: No results for input(s): INR, PROTIME in the last 168 hours. Cardiac Enzymes: No results for input(s): CKTOTAL, CKMB, CKMBINDEX, TROPONINI in the last 168 hours. BNP (last 3 results) No results for input(s): PROBNP in the  last 8760 hours. HbA1C: No results for input(s): HGBA1C in the last 72 hours. CBG: No results for input(s): GLUCAP in the last 168 hours. Lipid Profile: No results for input(s): CHOL, HDL, LDLCALC, TRIG, CHOLHDL, LDLDIRECT in the last 72 hours. Thyroid Function Tests: No results for input(s): TSH, T4TOTAL, FREET4, T3FREE, THYROIDAB in the last 72 hours. Anemia Panel: No results for input(s): VITAMINB12, FOLATE, FERRITIN, TIBC, IRON, RETICCTPCT in the last 72 hours. Urine analysis:    Component Value Date/Time   COLORURINE YELLOW 08/20/2013 1003   APPEARANCEUR CLOUDY* 08/20/2013 1003   LABSPEC 1.020 08/20/2013 1003   PHURINE 5.5 08/20/2013 1003   GLUCOSEU NEGATIVE 08/20/2013 1003   HGBUR LARGE* 08/20/2013 1003   BILIRUBINUR NEGATIVE 08/20/2013 1003   KETONESUR 15* 08/20/2013 1003   PROTEINUR NEGATIVE 08/20/2013 1003   UROBILINOGEN 0.2 08/20/2013 1003   NITRITE NEGATIVE 08/20/2013 1003   LEUKOCYTESUR MODERATE* 08/20/2013 1003   Sepsis Labs: @LABRCNTIP (procalcitonin:4,lacticidven:4) )No results found for this or any previous visit (from the past 240 hour(s)).   Radiological Exams on Admission: No results found.   EKG: Not done in ED, will get one.  Assessment/Plan Principal Problem:   Closed left hip fracture (HCC) Active Problems:   Persistent atrial fibrillation (HCC)   HTN (hypertension)   Hyperlipidemia   Chronic diastolic (congestive) heart failure (HCC)   Closed left hip fracture (HCC):  As evidenced by x-ray. Patient has severe pain now. No neurovascular compromise. Orthopedic surgeon was consulted. Dr. Magnus Ivan will see pt in AM, likely do surgery in Saturday.  - will place on med-surg bed for obs - Pain control: morphine prn and percocet - follow up ortho recs - NPO after MN - type and cross - INR/PTT  Leukocytosis: Likely due to stress-induced demargination. Patient does not have signs of infection. -Follow-up CBC  Atrial Fibrillation: CHA2DS2-VASc  Score is 5, needs oral anticoagulation. Patient is on Coumadin at home. INR is 2.31 on admission. Heart rate is well controlled. -hold Coumadin now -INR daily -continue Cardizem and metoprolol  Hypertension: -Continue Cardizem, metoprolol -Also on Lasix  HLD: -Continue Lipitor  Chronic diastolic (congestive) heart failure (HCC): 2-D echo on 11/01/13 showed EF of 55-60%. Patient is on Lasix. No leg edema or JVD. CHF is compensated. -Continue Lasix -Check BNP   DVT ppx: SQ Heparin (for two dose, since pt may need surgery on Sat) and SCD Code Status: Full code Family Communication: None at bed side. Disposition Plan:  Anticipate discharge back to previous home environment Consults called:  Ortho, Dr. Magnus Ivan Admission status:  medical floor/obs   Date of Service 11/10/2015    Lorretta Harp [facility] Pager [phone]  If 7PM-7AM, please contact night-coverage [email or web address] Password [email or web address] 11/10/2015, 2:58 AM

## 2015-11-10 NOTE — Progress Notes (Signed)
OT Cancellation Note  Patient Details Name: Kendra Hughes MRN: [id number] DOB: [birth year]   Cancelled Treatment:    Reason Eval/Treat Not Completed: Medical issues which prohibited therapy (pending surgery saturday) Pt currently with strict bed rest. OT to hold until Sunday 11/12/15  Felecia Shelling   OTR/L Pager: [phone] Office: [phone] .  11/10/2015, 6:59 AM

## 2015-11-10 NOTE — Progress Notes (Signed)
Patient ID: Kendra NGHIEM, female   DOB: 1923-02-12, 82 y.o.   MRN: [id number] I have seen and examined Kendra Hughes and talked to her about her situation.  Her INR is 2.15 this am.  Will let her eat today and given Vitamin K and then plan on surgery first thing tomorrow am.  Full consult note to follow.  I'll see her again this afternoon.

## 2015-11-10 NOTE — Care Management Obs Status (Signed)
MEDICARE OBSERVATION STATUS NOTIFICATION   Patient Details  Name: Kendra Hughes MRN: [id number] Date of Birth: 1922/12/29   Medicare Observation Status Notification Given:  Yes    Durenda Guthrie, RN 11/10/2015, 4:00 PM

## 2015-11-11 ENCOUNTER — Inpatient Hospital Stay (HOSPITAL_COMMUNITY): Payer: Medicare Other

## 2015-11-11 ENCOUNTER — Encounter (HOSPITAL_COMMUNITY)
Admission: EM | Disposition: A | Discharge status: Rehabilitation Facility | Payer: Self-pay | Source: Other Acute Inpatient Hospital | Attending: Internal Medicine

## 2015-11-11 ENCOUNTER — Inpatient Hospital Stay (HOSPITAL_COMMUNITY): Payer: Medicare Other | Admitting: Anesthesiology

## 2015-11-11 ENCOUNTER — Encounter (HOSPITAL_COMMUNITY): Payer: Self-pay | Admitting: Certified Registered"

## 2015-11-11 HISTORY — PX: TOTAL HIP ARTHROPLASTY: SHX124

## 2015-11-11 LAB — CBC
HEMATOCRIT: 37.8 % (ref 36.0–46.0)
HEMOGLOBIN: 12.4 g/dL (ref 12.0–15.0)
MCH: 30.1 pg (ref 26.0–34.0)
MCHC: 32.8 g/dL (ref 30.0–36.0)
MCV: 91.7 fL (ref 78.0–100.0)
Platelets: 209 10*3/uL (ref 150–400)
RBC: 4.12 MIL/uL (ref 3.87–5.11)
RDW: 15.8 % — ABNORMAL HIGH (ref 11.5–15.5)
WBC: 7.7 10*3/uL (ref 4.0–10.5)

## 2015-11-11 LAB — PROTIME-INR
INR: 1.53 — AB (ref 0.00–1.49)
Prothrombin Time: 18.4 seconds — ABNORMAL HIGH (ref 11.6–15.2)

## 2015-11-11 SURGERY — ARTHROPLASTY, HIP, TOTAL, ANTERIOR APPROACH
Anesthesia: General | Site: Hip | Laterality: Left

## 2015-11-11 MED ORDER — ONDANSETRON HCL 4 MG/2ML IJ SOLN
INTRAMUSCULAR | Status: DC | PRN
  Administered 2015-11-11: 4 mg via INTRAVENOUS

## 2015-11-11 MED ORDER — METOPROLOL TARTRATE 5 MG/5ML IV SOLN
INTRAVENOUS | Status: AC
  Filled 2015-11-11: qty 5

## 2015-11-11 MED ORDER — METHOCARBAMOL 1000 MG/10ML IJ SOLN
500.0000 mg | Freq: Four times a day (QID) | INTRAVENOUS | Status: DC | PRN
  Filled 2015-11-11: qty 5

## 2015-11-11 MED ORDER — METHOCARBAMOL 500 MG PO TABS
500.0000 mg | ORAL_TABLET | Freq: Four times a day (QID) | ORAL | Status: DC | PRN
  Administered 2015-11-13 – 2015-11-14 (×2): 500 mg via ORAL
  Filled 2015-11-11 (×2): qty 1

## 2015-11-11 MED ORDER — HYDROCODONE-ACETAMINOPHEN 5-325 MG PO TABS
1.0000 | ORAL_TABLET | Freq: Four times a day (QID) | ORAL | Status: DC | PRN
  Administered 2015-11-13: 2 via ORAL
  Administered 2015-11-14: 1 via ORAL
  Filled 2015-11-11: qty 2
  Filled 2015-11-11: qty 1

## 2015-11-11 MED ORDER — METOCLOPRAMIDE HCL 5 MG/ML IJ SOLN: 5.0000 mg | Freq: Three times a day (TID) | INTRAMUSCULAR | Status: DC | PRN

## 2015-11-11 MED ORDER — FENTANYL CITRATE (PF) 100 MCG/2ML IJ SOLN
INTRAMUSCULAR | Status: DC | PRN
  Administered 2015-11-11: 100 ug via INTRAVENOUS

## 2015-11-11 MED ORDER — NEOSTIGMINE METHYLSULFATE 5 MG/5ML IV SOSY
PREFILLED_SYRINGE | INTRAVENOUS | Status: AC
  Filled 2015-11-11: qty 5

## 2015-11-11 MED ORDER — ROCURONIUM BROMIDE 50 MG/5ML IV SOLN
INTRAVENOUS | Status: AC
  Filled 2015-11-11: qty 1

## 2015-11-11 MED ORDER — PHENYLEPHRINE HCL 10 MG/ML IJ SOLN
10.0000 mg | INTRAVENOUS | Status: DC | PRN
  Administered 2015-11-11: 100 ug/min via INTRAVENOUS

## 2015-11-11 MED ORDER — WARFARIN - PHARMACIST DOSING INPATIENT
Freq: Every day | Status: DC
  Administered 2015-11-11 – 2015-11-12 (×2)
  Administered 2015-11-15: 1

## 2015-11-11 MED ORDER — LIDOCAINE HCL (CARDIAC) 20 MG/ML IV SOLN
INTRAVENOUS | Status: DC | PRN
  Administered 2015-11-11: 100 mg via INTRATRACHEAL

## 2015-11-11 MED ORDER — SODIUM CHLORIDE 0.9 % IV SOLN
INTRAVENOUS | Status: DC
  Administered 2015-11-11 – 2015-11-14 (×2): via INTRAVENOUS

## 2015-11-11 MED ORDER — PROPOFOL 10 MG/ML IV BOLUS
INTRAVENOUS | Status: AC
  Filled 2015-11-11: qty 20

## 2015-11-11 MED ORDER — NEOSTIGMINE METHYLSULFATE 10 MG/10ML IV SOLN
INTRAVENOUS | Status: DC | PRN
  Administered 2015-11-11: 2 mg via INTRAVENOUS

## 2015-11-11 MED ORDER — METOCLOPRAMIDE HCL 5 MG PO TABS: 5.0000 mg | ORAL_TABLET | Freq: Three times a day (TID) | ORAL | Status: DC | PRN

## 2015-11-11 MED ORDER — ROCURONIUM BROMIDE 100 MG/10ML IV SOLN
INTRAVENOUS | Status: DC | PRN
  Administered 2015-11-11: 30 mg via INTRAVENOUS

## 2015-11-11 MED ORDER — LACTATED RINGERS IV SOLN
INTRAVENOUS | Status: DC
  Administered 2015-11-11: 07:00:00 via INTRAVENOUS

## 2015-11-11 MED ORDER — GLYCOPYRROLATE 0.2 MG/ML IJ SOLN
INTRAMUSCULAR | Status: DC | PRN
  Administered 2015-11-11: 0.3 mg via INTRAVENOUS

## 2015-11-11 MED ORDER — CEFAZOLIN SODIUM-DEXTROSE 2-4 GM/100ML-% IV SOLN
2.0000 g | Freq: Four times a day (QID) | INTRAVENOUS | Status: AC
  Administered 2015-11-11 (×2): 2 g via INTRAVENOUS
  Filled 2015-11-11 (×2): qty 100

## 2015-11-11 MED ORDER — PROPOFOL 10 MG/ML IV BOLUS
INTRAVENOUS | Status: DC | PRN
  Administered 2015-11-11: 100 mg via INTRAVENOUS

## 2015-11-11 MED ORDER — PHENOL 1.4 % MT LIQD
1.0000 | OROMUCOSAL | Status: DC | PRN
Start: 2015-11-11 — End: 2015-11-16

## 2015-11-11 MED ORDER — MEPERIDINE HCL 25 MG/ML IJ SOLN: 6.2500 mg | INTRAMUSCULAR | Status: DC | PRN

## 2015-11-11 MED ORDER — ONDANSETRON HCL 4 MG/2ML IJ SOLN
INTRAMUSCULAR | Status: AC
  Filled 2015-11-11: qty 2

## 2015-11-11 MED ORDER — MORPHINE SULFATE (PF) 2 MG/ML IV SOLN: 0.5000 mg | INTRAVENOUS | Status: DC | PRN

## 2015-11-11 MED ORDER — WARFARIN SODIUM 4 MG PO TABS
4.0000 mg | ORAL_TABLET | Freq: Once | ORAL | Status: DC
  Filled 2015-11-11: qty 1

## 2015-11-11 MED ORDER — WARFARIN SODIUM 5 MG PO TABS
5.0000 mg | ORAL_TABLET | Freq: Once | ORAL | Status: AC
  Administered 2015-11-11: 5 mg via ORAL
  Filled 2015-11-11: qty 1

## 2015-11-11 MED ORDER — ACETAMINOPHEN 650 MG RE SUPP: 650.0000 mg | Freq: Four times a day (QID) | RECTAL | Status: DC | PRN

## 2015-11-11 MED ORDER — ACETAMINOPHEN 325 MG PO TABS
650.0000 mg | ORAL_TABLET | Freq: Four times a day (QID) | ORAL | Status: DC | PRN
Start: 2015-11-11 — End: 2015-11-16
  Administered 2015-11-15: 650 mg via ORAL
  Filled 2015-11-11: qty 2

## 2015-11-11 MED ORDER — ONDANSETRON HCL 4 MG PO TABS: 4.0000 mg | ORAL_TABLET | Freq: Four times a day (QID) | ORAL | Status: DC | PRN

## 2015-11-11 MED ORDER — CEFAZOLIN SODIUM-DEXTROSE 2-4 GM/100ML-% IV SOLN
2.0000 g | Freq: Once | INTRAVENOUS | Status: AC
  Administered 2015-11-11: 2 g via INTRAVENOUS

## 2015-11-11 MED ORDER — GLYCOPYRROLATE 0.2 MG/ML IV SOSY
PREFILLED_SYRINGE | INTRAVENOUS | Status: AC
  Filled 2015-11-11: qty 3

## 2015-11-11 MED ORDER — MENTHOL 3 MG MT LOZG: 1.0000 | LOZENGE | OROMUCOSAL | Status: DC | PRN

## 2015-11-11 MED ORDER — PHENYLEPHRINE HCL 10 MG/ML IJ SOLN
INTRAMUSCULAR | Status: AC
  Filled 2015-11-11: qty 1

## 2015-11-11 MED ORDER — CEFAZOLIN SODIUM-DEXTROSE 2-4 GM/100ML-% IV SOLN
INTRAVENOUS | Status: AC
  Filled 2015-11-11: qty 100

## 2015-11-11 MED ORDER — DILTIAZEM HCL ER 60 MG PO CP12
60.0000 mg | ORAL_CAPSULE | Freq: Once | ORAL | Status: AC
  Administered 2015-11-11: 60 mg via ORAL
  Filled 2015-11-11: qty 1

## 2015-11-11 MED ORDER — DILTIAZEM HCL 25 MG/5ML IV SOLN
20.0000 mg | Freq: Once | INTRAVENOUS | Status: AC
  Administered 2015-11-11: 20 mg via INTRAVENOUS
  Filled 2015-11-11: qty 5

## 2015-11-11 MED ORDER — 0.9 % SODIUM CHLORIDE (POUR BTL) OPTIME
TOPICAL | Status: DC | PRN
  Administered 2015-11-11: 1000 mL

## 2015-11-11 MED ORDER — FENTANYL CITRATE (PF) 100 MCG/2ML IJ SOLN: 25.0000 ug | INTRAMUSCULAR | Status: DC | PRN

## 2015-11-11 MED ORDER — ONDANSETRON HCL 4 MG/2ML IJ SOLN: 4.0000 mg | Freq: Four times a day (QID) | INTRAMUSCULAR | Status: DC | PRN

## 2015-11-11 MED ORDER — LIDOCAINE 2% (20 MG/ML) 5 ML SYRINGE
INTRAMUSCULAR | Status: AC
  Filled 2015-11-11: qty 5

## 2015-11-11 MED ORDER — METOPROLOL TARTRATE 5 MG/5ML IV SOLN
INTRAVENOUS | Status: DC | PRN
  Administered 2015-11-11: 1 mg via INTRAVENOUS
  Administered 2015-11-11: 2 mg via INTRAVENOUS
  Administered 2015-11-11 (×2): 1 mg via INTRAVENOUS

## 2015-11-11 MED ORDER — METOPROLOL TARTRATE 50 MG PO TABS
50.0000 mg | ORAL_TABLET | Freq: Once | ORAL | Status: AC
  Administered 2015-11-11: 50 mg via ORAL
  Filled 2015-11-11: qty 1

## 2015-11-11 MED ORDER — FENTANYL CITRATE (PF) 250 MCG/5ML IJ SOLN
INTRAMUSCULAR | Status: AC
  Filled 2015-11-11: qty 5

## 2015-11-11 SURGICAL SUPPLY — 51 items
BENZOIN TINCTURE PRP APPL 2/3 (GAUZE/BANDAGES/DRESSINGS) IMPLANT
BLADE SAW SGTL 18X1.27X75 (BLADE) ×2 IMPLANT
BLADE SAW SGTL 18X1.27X75MM (BLADE) ×1
BLADE SURG ROTATE 9660 (MISCELLANEOUS) IMPLANT
CAPT HIP HEMI 2 ×3 IMPLANT
CELLS DAT CNTRL 66122 CELL SVR (MISCELLANEOUS) ×1 IMPLANT
CLOSURE WOUND 1/2 X4 (GAUZE/BANDAGES/DRESSINGS)
COVER SURGICAL LIGHT HANDLE (MISCELLANEOUS) ×3 IMPLANT
DRAPE C-ARM 42X72 X-RAY (DRAPES) ×3 IMPLANT
DRAPE STERI IOBAN 125X83 (DRAPES) IMPLANT
DRAPE U-SHAPE 47X51 STRL (DRAPES) ×9 IMPLANT
DRSG AQUACEL AG ADV 3.5X10 (GAUZE/BANDAGES/DRESSINGS) ×3 IMPLANT
DURAPREP 26ML APPLICATOR (WOUND CARE) ×3 IMPLANT
ELECT BLADE 4.0 EZ CLEAN MEGAD (MISCELLANEOUS) ×3
ELECT BLADE 6.5 EXT (BLADE) IMPLANT
ELECT REM PT RETURN 9FT ADLT (ELECTROSURGICAL) ×3
ELECTRODE BLDE 4.0 EZ CLN MEGD (MISCELLANEOUS) ×1 IMPLANT
ELECTRODE REM PT RTRN 9FT ADLT (ELECTROSURGICAL) ×1 IMPLANT
FACESHIELD WRAPAROUND (MASK) ×3 IMPLANT
GAUZE XEROFORM 1X8 LF (GAUZE/BANDAGES/DRESSINGS) ×3 IMPLANT
GLOVE BIOGEL PI IND STRL 8 (GLOVE) ×2 IMPLANT
GLOVE BIOGEL PI INDICATOR 8 (GLOVE) ×4
GLOVE ECLIPSE 8.0 STRL XLNG CF (GLOVE) IMPLANT
GLOVE ORTHO TXT STRL SZ7.5 (GLOVE) ×6 IMPLANT
GOWN STRL REUS W/ TWL LRG LVL3 (GOWN DISPOSABLE) ×3 IMPLANT
GOWN STRL REUS W/ TWL XL LVL3 (GOWN DISPOSABLE) ×1 IMPLANT
GOWN STRL REUS W/TWL LRG LVL3 (GOWN DISPOSABLE) ×6
GOWN STRL REUS W/TWL XL LVL3 (GOWN DISPOSABLE) ×2
HANDPIECE INTERPULSE COAX TIP (DISPOSABLE)
KIT BASIN OR (CUSTOM PROCEDURE TRAY) ×3 IMPLANT
KIT ROOM TURNOVER OR (KITS) ×3 IMPLANT
MANIFOLD NEPTUNE II (INSTRUMENTS) ×3 IMPLANT
NS IRRIG 1000ML POUR BTL (IV SOLUTION) ×3 IMPLANT
PACK TOTAL JOINT (CUSTOM PROCEDURE TRAY) ×3 IMPLANT
PAD ARMBOARD 7.5X6 YLW CONV (MISCELLANEOUS) ×3 IMPLANT
RTRCTR WOUND ALEXIS 18CM MED (MISCELLANEOUS) ×3
SET HNDPC FAN SPRY TIP SCT (DISPOSABLE) IMPLANT
STAPLER VISISTAT 35W (STAPLE) ×3 IMPLANT
STRIP CLOSURE SKIN 1/2X4 (GAUZE/BANDAGES/DRESSINGS) IMPLANT
SUT ETHIBOND NAB CT1 #1 30IN (SUTURE) ×6 IMPLANT
SUT MNCRL AB 4-0 PS2 18 (SUTURE) IMPLANT
SUT VIC AB 0 CT1 27 (SUTURE) ×2
SUT VIC AB 0 CT1 27XBRD ANBCTR (SUTURE) ×1 IMPLANT
SUT VIC AB 1 CT1 27 (SUTURE) ×2
SUT VIC AB 1 CT1 27XBRD ANBCTR (SUTURE) ×1 IMPLANT
SUT VIC AB 2-0 CT1 27 (SUTURE) ×2
SUT VIC AB 2-0 CT1 TAPERPNT 27 (SUTURE) ×1 IMPLANT
TOWEL OR 17X24 6PK STRL BLUE (TOWEL DISPOSABLE) ×3 IMPLANT
TOWEL OR 17X26 10 PK STRL BLUE (TOWEL DISPOSABLE) ×3 IMPLANT
TRAY FOLEY CATH 16FRSI W/METER (SET/KITS/TRAYS/PACK) ×3 IMPLANT
WATER STERILE IRR 1000ML POUR (IV SOLUTION) ×3 IMPLANT

## 2015-11-11 NOTE — Anesthesia Procedure Notes (Signed)
Procedure Name: Intubation Date/Time: 11/11/2015 7:49 AM Performed by: Charm Barges, Blayze Haen R Pre-anesthesia Checklist: Patient identified, Emergency Drugs available, Suction available and Patient being monitored Patient Re-evaluated:Patient Re-evaluated prior to inductionOxygen Delivery Method: Circle System Utilized Preoxygenation: Pre-oxygenation with 100% oxygen Intubation Type: IV induction Ventilation: Mask ventilation without difficulty Laryngoscope Size: Mac and 3 Grade View: Grade II Tube type: Oral Tube size: 7.5 mm Number of attempts: 1 Airway Equipment and Method: Stylet Placement Confirmation: ETT inserted through vocal cords under direct vision,  positive ETCO2 and breath sounds checked- equal and bilateral Secured at: 21 cm Tube secured with: Tape Dental Injury: Teeth and Oropharynx as per pre-operative assessment

## 2015-11-11 NOTE — Transfer of Care (Signed)
Immediate Anesthesia Transfer of Care Note  Patient: Kendra Hughes  Procedure(s) Performed: Procedure(s):  HEMI ARTHROPLASTY ANTERIOR APPROACH LEFT (Left)  Patient Location: PACU  Anesthesia Type:General  Level of Consciousness: awake, oriented and patient cooperative  Airway & Oxygen Therapy: Patient Spontanous Breathing and Patient connected to nasal cannula oxygen  Post-op Assessment: Report given to RN, Post -op Vital signs reviewed and stable and Patient moving all extremities  Post vital signs: Reviewed and stable  Last Vitals:  Filed Vitals:   11/10/15 1927 11/11/15 0411  BP: 123/72 117/52  Pulse: 105 80  Temp: 36.6 C 36.7 C  Resp: 18 18    Last Pain:  Filed Vitals:   11/11/15 0412  PainSc: 5       Patients Stated Pain Goal: 0 (11/10/15 1248)  Complications: No apparent anesthesia complications

## 2015-11-11 NOTE — Progress Notes (Signed)
PT Cancellation Note  Patient Details Name: Kendra Hughes MRN: [id number] DOB: 05/20/1923   Cancelled Treatment:    Reason Eval/Treat Not Completed: Patient at procedure or test/unavailable   Fabio Asa 11/11/2015, 6:50 AM Charlotte Crumb, PT DPT  [phone]

## 2015-11-11 NOTE — Brief Op Note (Signed)
11/10/2015 - 11/11/2015  9:32 AM  PATIENT:  Kendra Hughes  82 y.o. female  PRE-OPERATIVE DIAGNOSIS:  left hip femoral neck fracture  POST-OPERATIVE DIAGNOSIS:  left hip femoral neck fracture  PROCEDURE:  Procedure(s):  HEMI ARTHROPLASTY ANTERIOR APPROACH LEFT (Left)  SURGEON:  Surgeon(s) and Role:    * Kathryne Hitch, MD - Primary  ANESTHESIA:   general  EBL:  Total I/O In: -  Out: 550 [Urine:400; Blood:150]  COUNTS:  YES  TOURNIQUET:  * No tourniquets in log *  DICTATION: .Other Dictation: Dictation Number [id number]  PLAN OF CARE: Admit to inpatient   PATIENT DISPOSITION:  PACU - hemodynamically stable.   Delay start of Pharmacological VTE agent (>24hrs) due to surgical blood loss or risk of bleeding: no

## 2015-11-11 NOTE — Clinical Social Work Note (Signed)
Clinical Social Work Assessment  Patient Details  Name: Kendra Hughes MRN: [id number] Date of Birth: [birth year]  Date of referral:  11/11/15               Reason for consult:  Facility Placement, Other (Comment Required) (Wants inpatient rehab)                Permission sought to share information with:  Family Supports Permission granted to share information::  Yes, Verbal Permission Granted  Name::     Husband and 2 sons           Housing/Transportation Living arrangements for the past 2 months:  Single Family Home Source of Information:  Patient, Adult Children, Spouse Patient Interpreter Needed:  None Criminal Activity/Legal Involvement Pertinent to Current Situation/Hospitalization:  No - Comment as needed Significant Relationships:  Spouse, Adult Children Lives with:  Spouse Do you feel safe going back to the place where you live?  Yes Need for family participation in patient care:  Yes (Comment) (Patient appears alert and oriented but wants her husband and sons involved)  Care giving concerns: Recent hip surgery; not able to be independent right now and this worries her.  Social Worker assessment / plan:  CSW met with patient, husband and one of their son's - Kendra Hughes today to discuss possible d/c plans. Patient underwent hip surgery today- however she was noted to be alert and engaged in today's discussion.  Patient and husband live [place] and want to be considered for the inpatient rehab facility at [facility] in [place]. Patient's primary doctor is at [facility] but heart MD's are at [facility].  Discussed process of getting someone referred for inpatient rehab and acceptance requirements.  Also discussed possible short term SNF and home health services.  Patient, husband and son only want to consider SNF as a last option and will await PT's evaluation to determine progress and recommendations.  Fl2 will be initiated and placed on chart "just in case" SNF is needed.  Son states that he can  easily stay with patient at home and provide support if needed.    Employment status:  Retired Forensic scientist:  Medicare PT Recommendations:  Not assessed at this time Information / Referral to community resources:  [place], Other (Comment Required) (In patient rehab )  Patient/Family's Response to care:  Patient, husband and son were extremely complimentary of current care at the hospital and state that they have been very pleased with all staff. Expressed a lot of confidence in MD's and staff and deny any current needs or concerns.   Patient/Family's Understanding of and Emotional Response to Diagnosis, Current Treatment, and Prognosis:  Patient is able to discuss her type of surgery and how her health has affected her care issues  She has a very positive attitude and states she wants to get better soon to return home. She is also agreeable to rehab at home if necessary.  Husband and son are very supportive and involved in her care; they appear knowledgeable about her surgery and ongoing care issues    Emotional Assessment Appearance:  Appears stated age, Well-Groomed Attitude/Demeanor/Rapport:   (Cooperative, responsive, relaxed, friendly rational) Affect (typically observed):  Accepting, Happy, Pleasant Orientation:  Oriented to Self, Oriented to Place, Oriented to  Time, Oriented to Situation Alcohol / Substance use:  Never Used Psych involvement (Current and /or in the community):  No (Comment)  Discharge Needs  Concerns to be addressed:  Care Coordination Readmission  within the last 30 days:  No Current discharge risk:  Dependent with Mobility Barriers to Discharge:  Continued Medical Work up   Estill Bakes 11/11/2015, 9:36 PM

## 2015-11-11 NOTE — Progress Notes (Signed)
PROGRESS NOTE                                                                                                                                                                                                             Patient Demographics:    Kendra Hughes, is a 82 y.o. female, DOB - January 17, 1923, IOE:[id number]  Admit date - 11/10/2015   Admitting Physician Lorretta Harp, MD  Outpatient Primary MD for the patient is Carmin Richmond, MD  LOS - 1  Outpatient Specialists: Dr. Rennis Golden (cardiology)  No chief complaint on file.      Brief Narrative   82 year old female with history of hypertension, hyperlipidemia, chronic A. fib on Coumadin, diastolic CHF, vitamin D deficiency presented with fall at home with pain in her left hip. Patient reports getting out of shower and trying to get out of her towel when she fell backwards and landed on her hip. Denied loss of consciousness or sustaining any other injuries. Patient reports that she has unsteady gait and does tend to sway backwards but holds on to things.  In the ED patient's vitals were stable.  WBC of 15.6 with normal electrolytes and INR of 2.2. X-ray of the left hip showed acute subcapsular fracture of left hip with superior migration of the left hip and femur.    S/P Left hemiarthroplasty with Dr. Magnus Ivan on 11/11/2015.   Subjective:   Pain is controlled.  Appetite is still decreased.  No nausea or vomiting.  Husband and son at bedside.   Assessment  & Plan :    Principal Problem:   Closed left hip fracture (HCC) S/P Left hemiarthroplasty with Dr. Magnus Ivan on 11/11/2015. Patient does not have any signs or symptoms of CHF, chest pain or dizziness. She is on beta blocker which is continued perioperatively. TEE from 2015 shows EF of 55-60%. Patient has moderate risk for postoperative cardiac complications. No preoperative cardiac workup recommended. She received Vitamin K  pre-operatively.  Warfarin management per pharmacy.  Active Problems:   Persistent atrial fibrillation (HCC) Monitor on telemetry.  Warfarin management per pharmacy. She is a fall risk.    HTN (hypertension) Stable. Continue Lasix, metoprolol and Cardizem.    Hyperlipidemia Continue Lipitor    Chronic diastolic (congestive) heart failure (HCC) Euvolemic. Continue Lasix.  Caution with peri-operative fluids.  Leukocytosis Possibly reactive. Repeat WBC  improved  Code Status : Full code  Family Communication  : Husband and son at bedside  Disposition Plan  : Needs skilled nursing facility upon discharge. Family requesting [facility].  No discharge before Monday.    Barriers For Discharge :  Volume status, rate control post-operatively  Consults  :   Orthopedics (Dr. Magnus Ivan)  Procedures  :  S/P left hemiarthroplasty  DVT Prophylaxis  :  Warfarin  Lab Results  Component Value Date   PLT 209 11/11/2015    Antibiotics  :    Anti-infectives    Start     Dose/Rate Route Frequency Ordered Stop   11/11/15 1400  ceFAZolin (ANCEF) IVPB 2g/100 mL premix     2 g 200 mL/hr over 30 Minutes Intravenous Every 6 hours 11/11/15 1159 11/12/15 0159   11/11/15 0730  ceFAZolin (ANCEF) IVPB 2g/100 mL premix     2 g 200 mL/hr over 30 Minutes Intravenous  Once 11/11/15 0716 11/11/15 0743   11/11/15 0718  ceFAZolin (ANCEF) 2-4 GM/100ML-% IVPB    Comments:  Shireen Quan   : cabinet override      11/11/15 0718 11/11/15 0730        Objective:   Filed Vitals:   11/11/15 1039 11/11/15 1050 11/11/15 1100 11/11/15 1130  BP:  106/68  101/48  Pulse: 86 92  102  Temp:   98 F (36.7 C) 98.2 F (36.8 C)  TempSrc:      Resp: 16 16  12   Height:      Weight:      SpO2: 100% 100%  91%    Wt Readings from Last 3 Encounters:  11/10/15 70.308 kg (155 lb)  08/21/15 70.67 kg (155 lb 12.8 oz)  02/17/15 67.813 kg (149 lb 8 oz)     Intake/Output Summary (Last 24 hours) at 11/11/15  1440 Last data filed at 11/11/15 0900  Gross per 24 hour  Intake    240 ml  Output    550 ml  Net   -310 ml     Physical Exam  Gen: NAD, nontoxic appearing HEENT: mucous membranes slightly dry Pulm: CTA listening anteriorly CVS: Irregularly irregular, mildly tachycardic GI: soft, NT, ND, BS+ Musculoskeletal: limited mobility of left hip CNS: Alert and oriented, nonfocal    Data Review:    CBC  Recent Labs Lab 11/10/15 0441 11/11/15 0540  WBC 12.4* 7.7  HGB 12.6 12.4  HCT 38.7 37.8  PLT 212 209  MCV 91.9 91.7  MCH 29.9 30.1  MCHC 32.6 32.8  RDW 15.9* 15.8*    Chemistries   Recent Labs Lab 11/10/15 0441  NA 136  K 4.5  CL 99*  CO2 27  GLUCOSE 144*  BUN 17  CREATININE 0.81  CALCIUM 9.2   Coagulation profile  Recent Labs Lab 11/10/15 0441 11/10/15 1415 11/11/15 0540  INR 2.13* 2.14* 1.53*  ------------------------------------------------------------------------------------------------------------------    Component Value Date/Time   BNP 245.9* 11/10/2015 0441    Inpatient Medications  Scheduled Meds: . antiseptic oral rinse  7 mL Mouth Rinse BID  . atorvastatin  10 mg Oral q1800  .  ceFAZolin (ANCEF) IV  2 g Intravenous Q6H  . diltiazem  120 mg Oral Daily  . fluticasone  1 spray Each Nare Daily  . folic acid  1 mg Oral Daily  . furosemide  40 mg Oral Daily  . loratadine  10 mg Oral Daily  . metoprolol  50 mg Oral BID  . Vitamin D (  Ergocalciferol)  50,000 Units Oral Weekly  . warfarin  5 mg Oral ONCE-1800  . Warfarin - Pharmacist Dosing Inpatient   Does not apply q1800   Continuous Infusions: . sodium chloride 50 mL/hr at 11/11/15 1245  . lactated ringers 10 mL/hr at 11/11/15 0716   PRN Meds:.acetaminophen **OR** acetaminophen, HYDROcodone-acetaminophen, menthol-cetylpyridinium **OR** phenol, methocarbamol **OR** methocarbamol (ROBAXIN)  IV, metoCLOPramide **OR** metoCLOPramide (REGLAN) injection, morphine injection, ondansetron **OR**  ondansetron (ZOFRAN) IV, oxyCODONE-acetaminophen, senna-docusate  Micro Results Recent Results (from the past 240 hour(s))  Surgical pcr screen     Status: None   Collection Time: 11/10/15  6:06 PM  Result Value Ref Range Status   MRSA, PCR NEGATIVE NEGATIVE Final   Staphylococcus aureus NEGATIVE NEGATIVE Final    Comment:        The Xpert SA Assay (FDA approved for NASAL specimens in patients over 63 years of age), is one component of a comprehensive surveillance program.  Test performance has been validated by [facility] for patients greater than or equal to 29 year old. It is not intended to diagnose infection nor to guide or monitor treatment.     Radiology Reports Dg Hip Port Unilat With Pelvis 1v Left  11/10/2015  CLINICAL DATA:  Closed left hip fracture, initial visit. EXAM: DG HIP (WITH OR WITHOUT PELVIS) 1V PORT LEFT COMPARISON:  Left hip series of Nov 09, 2015 at 2200 hours. FINDINGS: The bony pelvis is diffusely osteopenic. No acute pelvic fracture is observed. There is an acute transverse subcapital fracture of the left hip with varus angulation. There is persistent superior migration of the femoral neck and a remainder of the femur superiorly with respect to the femoral head and acetabulum. There is narrowing of the left hip joint space similar to that seen on the right. IMPRESSION: Acute subcapsular fracture of the left hip with superior migration of the left hip and femur. Electronically Signed   By: David  [place] M.D.   On: 11/10/2015 08:29   Dg Hip Operative Unilat W Or W/o Pelvis Left  11/11/2015  CLINICAL DATA:  Femoral neck fracture. Initial encounter. EXAM: OPERATIVE left HIP (WITH PELVIS IF PERFORMED) AP VIEWS TECHNIQUE: Fluoroscopic spot image(s) were submitted for interpretation post-operatively. COMPARISON:  Radiography from yesterday FINDINGS: Bipolar left hip hemiarthroplasty without evidence of periprosthetic fracture or dislocation. IMPRESSION: Fluoroscopy for  left hip hemiarthroplasty.  No adverse finding. Electronically Signed   By: Marnee Spring M.D.   On: 11/11/2015 09:27    Time Spent in minutes  25   Jerene Bears M.D on 11/11/2015 at 2:40 PM   [facility] -  Office  [phone]

## 2015-11-11 NOTE — Progress Notes (Signed)
Thurmond Butts husband was given wedding band

## 2015-11-11 NOTE — Progress Notes (Addendum)
ANTICOAGULATION CONSULT NOTE - Initial Consult  Pharmacy Consult for heparin Indication: atrial fibrillation, VTE ppx s/p hip surgery on 5/6  No Known Allergies  Patient Measurements: Height: 5\' 5"  (165.1 cm) Weight: 155 lb (70.308 kg) IBW/kg (Calculated) : 57  Vital Signs: Temp: 98.2 F (36.8 C) (05/06 1130) Temp Source: Oral (05/06 0411) BP: 101/48 mmHg (05/06 1130) Pulse Rate: 102 (05/06 1130)  Labs:  Recent Labs  11/10/15 0441 11/10/15 1415 11/11/15 0540  HGB 12.6  --  12.4  HCT 38.7  --  37.8  PLT 212  --  209  APTT 38*  --   --   LABPROT 23.7* 23.8* 18.4*  INR 2.13* 2.14* 1.53*  CREATININE 0.81  --   --     Estimated Creatinine Clearance: 45.4 mL/min (by C-G formula based on Cr of 0.81).   Medical History: Past Medical History  Diagnosis Date  . PAF (paroxysmal atrial fibrillation) (HCC)   . Hypertension   . Dyslipidemia   . DOE (dyspnea on exertion)   . History of nuclear stress test 10/2009    normal exam  . Chronic diastolic (congestive) heart failure (HCC)   . DVT (deep venous thrombosis) (HCC) "years ago"  . Fracture of left hip (HCC) 11/09/2015    "fell"  . Arthritis     "right knee" (11/10/2015)  . Rheumatoid arthritis (HCC) 1970    "it's in remission" (11/10/2015)    Assessment: 89 yof on warfarin pta for afib, admitted with hip fracture s/p fall. INR 2.13 on admit, reversed with vit k prior to OR. Pharmacy consulted to restart warfarin 5/6 for afib, VTE ppx s/p hip surgery on 5/6. High fall risk per MD note. 2.14>1.53. CBC wnl. No bleed documented.  PTA warfarin dose: 3mg  daily except 4mg  on Mon  Goal of Therapy:  INR 2-3 Monitor platelets by anticoagulation protocol: Yes   Plan:  Warfarin 5mg  x 1 dose tonight Daily INR Monitor s/sx bleeding   , PharmD, BCPS Clinical Pharmacist Pager [phone] 11/11/2015 12:22 PM

## 2015-11-11 NOTE — Anesthesia Postprocedure Evaluation (Signed)
Anesthesia Post Note  Patient: Kendra Hughes  Procedure(s) Performed: Procedure(s) (LRB):  HEMI ARTHROPLASTY ANTERIOR APPROACH LEFT (Left)  Patient location during evaluation: PACU Anesthesia Type: General Level of consciousness: sedated and patient cooperative Pain management: pain level controlled Vital Signs Assessment: post-procedure vital signs reviewed and stable Respiratory status: spontaneous breathing Cardiovascular status: stable Anesthetic complications: no    Last Vitals:  Filed Vitals:   11/11/15 1100 11/11/15 1130  BP:  101/48  Pulse:  102  Temp: 36.7 C 36.8 C  Resp:  12    Last Pain:  Filed Vitals:   11/11/15 1130  PainSc: 5                  Lewie Loron

## 2015-11-11 NOTE — Progress Notes (Signed)
Patient ID: Kendra Hughes, female   DOB: 27-Nov-1922, 82 y.o.   MRN: [id number] Mrs. Prevost is awake and alert and ready for surgery this am.  Family is at the bedside.  Her INR is 1.53.  Risks and benefits of surgery have been discussed and informed consent is obtained.  Will proceed to the OR for a left hip hemiarthroplasty vs a total hip to treat her acute left hip femoral neck fracture.

## 2015-11-11 NOTE — Progress Notes (Signed)
Family given dentures.

## 2015-11-11 NOTE — NC FL2 (Signed)
[place] MEDICAID FL2 LEVEL OF CARE SCREENING TOOL     IDENTIFICATION  Patient Name: Kendra Hughes Birthdate: Oct 21, 1922 Sex: female Admission Date (Current Location): 11/10/2015  [facility] and [place] Number:  Producer, television/film/video and Address:  [facility] [place], [address] [place], [place] [address]      Provider Number: [id number]  Attending Physician Name and Address:  Michael Litter, MD  Relative Name and Phone Number:       Current Level of Care: Hospital Recommended Level of Care: Skilled Nursing Facility Prior Approval Number:    Date Approved/Denied:   PASRR Number: [id number]  Discharge Plan: SNF    Current Diagnoses: Patient Active Problem List   Diagnosis Date Noted  . Closed left hip fracture (HCC) 11/10/2015  . Chronic diastolic (congestive) heart failure (HCC)   . Hypotension 08/26/2013  . Mild AI, MR, mod TR by TEE 08/23/13 08/26/2013  . Persistent atrial fibrillation (HCC) 02/12/2013  . HTN (hypertension) 02/12/2013  . Hyperlipidemia 02/12/2013  . DOE (dyspnea on exertion) 02/12/2013    Orientation RESPIRATION BLADDER Height & Weight     Self, Time, Situation, Place  Normal Continent Weight: 155 lb (70.308 kg) Height:  5\' 5"  (165.1 cm)  BEHAVIORAL SYMPTOMS/MOOD NEUROLOGICAL BOWEL NUTRITION STATUS      Continent Diet  AMBULATORY STATUS COMMUNICATION OF NEEDS Skin   Limited Assist Verbally Surgical wounds (Incision Left Hip)                       Personal Care Assistance Level of Assistance  Bathing, Dressing Bathing Assistance: Limited assistance   Dressing Assistance: Limited assistance     Functional Limitations Info             SPECIAL CARE FACTORS FREQUENCY  PT (By licensed PT), OT (By licensed OT)     PT Frequency: 5 OT Frequency: 5            Contractures Contractures Info: Not present    Additional Factors Info  Code Status, Allergies Code Status Info: Full Allergies Info: NkA            Current Medications (11/11/2015):  This is the current hospital active medication list Current Facility-Administered Medications  Medication Dose Route Frequency Provider Last Rate Last Dose  . 0.9 %  sodium chloride infusion   Intravenous Continuous 01/11/2016, MD 50 mL/hr at 11/11/15 1245    . acetaminophen (TYLENOL) tablet 650 mg  650 mg Oral Q6H PRN 01/11/16, MD       Or  . acetaminophen (TYLENOL) suppository 650 mg  650 mg Rectal Q6H PRN Kathryne Hitch, MD      . antiseptic oral rinse (CPC / CETYLPYRIDINIUM CHLORIDE 0.05%) solution 7 mL  7 mL Mouth Rinse BID Nishant Dhungel, MD   7 mL at 11/10/15 2102  . atorvastatin (LIPITOR) tablet 10 mg  10 mg Oral q1800 2103, MD   10 mg at 11/11/15 1831  . diltiazem (CARDIZEM CD) 24 hr capsule 120 mg  120 mg Oral Daily 01/11/16, MD   120 mg at 11/10/15 01/10/16  . fluticasone (FLONASE) 50 MCG/ACT nasal spray 1 spray  1 spray Each Nare Daily 4627, MD   1 spray at 11/10/15 1000  . folic acid (FOLVITE) tablet 1 mg  1 mg Oral Daily 01/10/16, MD   1 mg at 11/10/15 0938  . furosemide (LASIX) tablet 40 mg  40 mg  Oral Daily Lorretta Harp, MD   40 mg at 11/10/15 6270  . HYDROcodone-acetaminophen (NORCO/VICODIN) 5-325 MG per tablet 1-2 tablet  1-2 tablet Oral Q6H PRN Kathryne Hitch, MD      . loratadine (CLARITIN) tablet 10 mg  10 mg Oral Daily Lorretta Harp, MD   10 mg at 11/10/15 3500  . menthol-cetylpyridinium (CEPACOL) lozenge 3 mg  1 lozenge Oral PRN Kathryne Hitch, MD       Or  . phenol (CHLORASEPTIC) mouth spray 1 spray  1 spray Mouth/Throat PRN Kathryne Hitch, MD      . methocarbamol (ROBAXIN) tablet 500 mg  500 mg Oral Q6H PRN Kathryne Hitch, MD       Or  . methocarbamol (ROBAXIN) 500 mg in dextrose 5 % 50 mL IVPB  500 mg Intravenous Q6H PRN Kathryne Hitch, MD      . metoCLOPramide (REGLAN) tablet 5-10 mg  5-10 mg Oral Q8H PRN Kathryne Hitch, MD       Or  .  metoCLOPramide (REGLAN) injection 5-10 mg  5-10 mg Intravenous Q8H PRN Kathryne Hitch, MD      . metoprolol (LOPRESSOR) tablet 50 mg  50 mg Oral BID Lorretta Harp, MD   50 mg at 11/10/15 2101  . morphine 2 MG/ML injection 0.5 mg  0.5 mg Intravenous Q2H PRN Kathryne Hitch, MD      . ondansetron [facility]) tablet 4 mg  4 mg Oral Q6H PRN Kathryne Hitch, MD       Or  . ondansetron [facility]) injection 4 mg  4 mg Intravenous Q6H PRN Kathryne Hitch, MD      . oxyCODONE-acetaminophen (PERCOCET/ROXICET) 5-325 MG per tablet 1 tablet  1 tablet Oral Q4H PRN Lorretta Harp, MD   1 tablet at 11/11/15 2024  . senna-docusate (Senokot-S) tablet 1 tablet  1 tablet Oral QHS PRN Lorretta Harp, MD      . Vitamin D (Ergocalciferol) (DRISDOL) capsule 50,000 Units  50,000 Units Oral Weekly Lorretta Harp, MD      . Warfarin - Pharmacist Dosing Inpatient   Does not apply q1800 Almon Hercules, [facility]         Discharge Medications: Please see discharge summary for a list of discharge medications.  Relevant Imaging Results:  Relevant Lab Results:   Additional Information SSN:  [id number]  Darylene Price, LCSW

## 2015-11-12 ENCOUNTER — Inpatient Hospital Stay (HOSPITAL_COMMUNITY): Payer: Medicare Other

## 2015-11-12 DIAGNOSIS — I959 Hypotension, unspecified: Secondary | ICD-10-CM

## 2015-11-12 DIAGNOSIS — I481 Persistent atrial fibrillation: Secondary | ICD-10-CM

## 2015-11-12 LAB — URINALYSIS, ROUTINE W REFLEX MICROSCOPIC
Bilirubin Urine: NEGATIVE
GLUCOSE, UA: NEGATIVE mg/dL
KETONES UR: NEGATIVE mg/dL
LEUKOCYTES UA: NEGATIVE
NITRITE: NEGATIVE
PROTEIN: 100 mg/dL — AB
Specific Gravity, Urine: 1.024 (ref 1.005–1.030)
pH: 5.5 (ref 5.0–8.0)

## 2015-11-12 LAB — CBC
HEMATOCRIT: 35.4 % — AB (ref 36.0–46.0)
HEMATOCRIT: 33.3 % — AB (ref 36.0–46.0)
HEMOGLOBIN: 10.8 g/dL — AB (ref 12.0–15.0)
Hemoglobin: 11.4 g/dL — ABNORMAL LOW (ref 12.0–15.0)
MCH: 29.5 pg (ref 26.0–34.0)
MCH: 29.8 pg (ref 26.0–34.0)
MCHC: 32.2 g/dL (ref 30.0–36.0)
MCHC: 32.4 g/dL (ref 30.0–36.0)
MCV: 91.7 fL (ref 78.0–100.0)
MCV: 92 fL (ref 78.0–100.0)
Platelets: 230 10*3/uL (ref 150–400)
Platelets: 212 10*3/uL (ref 150–400)
RBC: 3.86 MIL/uL — AB (ref 3.87–5.11)
RBC: 3.62 MIL/uL — ABNORMAL LOW (ref 3.87–5.11)
RDW: 15.7 % — ABNORMAL HIGH (ref 11.5–15.5)
RDW: 15.5 % (ref 11.5–15.5)
WBC: 8.7 10*3/uL (ref 4.0–10.5)
WBC: 8.6 10*3/uL (ref 4.0–10.5)

## 2015-11-12 LAB — URINE MICROSCOPIC-ADD ON

## 2015-11-12 LAB — BASIC METABOLIC PANEL
ANION GAP: 10 (ref 5–15)
BUN: 25 mg/dL — AB (ref 6–20)
CO2: 28 mmol/L (ref 22–32)
Calcium: 8.8 mg/dL — ABNORMAL LOW (ref 8.9–10.3)
Chloride: 95 mmol/L — ABNORMAL LOW (ref 101–111)
Creatinine, Ser: 1 mg/dL (ref 0.44–1.00)
GFR calc Af Amer: 56 mL/min — ABNORMAL LOW (ref >60)
GFR calc non Af Amer: 48 mL/min — ABNORMAL LOW (ref >60)
GLUCOSE: 126 mg/dL — AB (ref 65–99)
POTASSIUM: 4.2 mmol/L (ref 3.5–5.1)
Sodium: 133 mmol/L — ABNORMAL LOW (ref 135–145)

## 2015-11-12 LAB — T4, FREE: FREE T4: 1.04 ng/dL (ref 0.61–1.12)

## 2015-11-12 LAB — PROTIME-INR
INR: 1.38 (ref 0.00–1.49)
Prothrombin Time: 17.1 seconds — ABNORMAL HIGH (ref 11.6–15.2)

## 2015-11-12 LAB — TSH: TSH: 3.32 u[IU]/mL (ref 0.350–4.500)

## 2015-11-12 LAB — BRAIN NATRIURETIC PEPTIDE: B Natriuretic Peptide: 457.9 pg/mL — ABNORMAL HIGH (ref 0.0–100.0)

## 2015-11-12 LAB — TROPONIN I: Troponin I: 0.03 ng/mL (ref <0.031)

## 2015-11-12 MED ORDER — HYDROCODONE-ACETAMINOPHEN 5-325 MG PO TABS: 1.0000 | ORAL_TABLET | ORAL | Status: DC | PRN

## 2015-11-12 MED ORDER — SODIUM CHLORIDE 0.9 % IV BOLUS (SEPSIS)
250.0000 mL | Freq: Once | INTRAVENOUS | Status: AC
Start: 2015-11-12 — End: 2015-11-12
  Administered 2015-11-12: 250 mL via INTRAVENOUS

## 2015-11-12 MED ORDER — DIGOXIN 0.25 MG/ML IJ SOLN
0.2500 mg | Freq: Once | INTRAMUSCULAR | Status: AC
  Administered 2015-11-12: 0.25 mg via INTRAVENOUS
  Filled 2015-11-12: qty 1

## 2015-11-12 MED ORDER — DIGOXIN 0.25 MG/ML IJ SOLN
0.1250 mg | Freq: Once | INTRAMUSCULAR | Status: AC
  Administered 2015-11-12: 0.125 mg via INTRAVENOUS
  Filled 2015-11-12: qty 0.5

## 2015-11-12 MED ORDER — SODIUM CHLORIDE 0.9 % IV BOLUS (SEPSIS)
500.0000 mL | Freq: Once | INTRAVENOUS | Status: AC
  Administered 2015-11-12: 500 mL via INTRAVENOUS

## 2015-11-12 MED ORDER — SODIUM CHLORIDE 0.9 % IV SOLN
INTRAVENOUS | Status: AC
  Administered 2015-11-12: 18:00:00 via INTRAVENOUS

## 2015-11-12 MED ORDER — METOPROLOL TARTRATE 25 MG PO TABS
25.0000 mg | ORAL_TABLET | Freq: Two times a day (BID) | ORAL | Status: DC
Start: 2015-11-12 — End: 2015-11-13
  Administered 2015-11-13: 25 mg via ORAL
  Filled 2015-11-12: qty 1

## 2015-11-12 MED ORDER — SODIUM CHLORIDE 0.9 % IV BOLUS (SEPSIS)
250.0000 mL | Freq: Once | INTRAVENOUS | Status: AC
  Administered 2015-11-12: 250 mL via INTRAVENOUS

## 2015-11-12 MED ORDER — HEPARIN (PORCINE) IN NACL 100-0.45 UNIT/ML-% IJ SOLN
1000.0000 [IU]/h | INTRAMUSCULAR | Status: DC
  Administered 2015-11-12 – 2015-11-14 (×3): 1000 [IU]/h via INTRAVENOUS
  Filled 2015-11-12 (×4): qty 250

## 2015-11-12 MED ORDER — WARFARIN SODIUM 5 MG PO TABS
5.0000 mg | ORAL_TABLET | Freq: Once | ORAL | Status: AC
Start: 2015-11-12 — End: 2015-11-12
  Administered 2015-11-12: 5 mg via ORAL
  Filled 2015-11-12: qty 1

## 2015-11-12 MED ORDER — DILTIAZEM HCL ER COATED BEADS 120 MG PO CP24
120.0000 mg | ORAL_CAPSULE | Freq: Every day | ORAL | Status: DC
  Administered 2015-11-13 – 2015-11-16 (×4): 120 mg via ORAL
  Filled 2015-11-12 (×4): qty 1

## 2015-11-12 NOTE — Evaluation (Signed)
Occupational Therapy Evaluation Patient Details Name: Kendra Hughes MRN: [id number] DOB: 1922-08-24 Today's Date: 11/12/2015    History of Present Illness Pt is a 82 y/o female who sustained a displaced L femoral neck fracture after a mechanical fall on 11/09/15. Pt is now s/p L hip hemiarthroplasty and has no precautions.    Clinical Impression   Pt reports she was very independent PTA; completing all BADLs and IADLs independently. Pt currently max assist +2 for sit to stand and bed mobility. OT eval limited by increased HR and decreased BP/SpO2 (RN aware). Recommend CIR for follow up in order to maximize independence and safety with ADLs and functional mobility prior to return home. Discussed post acute rehab with pt and family; they are agreeable but may want to consider options closer to home; [facility]. Pt would benefit from continued skilled OT to address established goals.    Follow Up Recommendations  CIR;Supervision/Assistance - 24 hour    Equipment Recommendations  Other (comment) (TBD at next venue)    Recommendations for Other Services Rehab consult     Precautions / Restrictions Precautions Precautions: Fall Restrictions Weight Bearing Restrictions: Yes LLE Weight Bearing: Weight bearing as tolerated      Mobility Bed Mobility Overal bed mobility: Needs Assistance;+2 for physical assistance Bed Mobility: Supine to Sit     Supine to sit: Max assist;+2 for physical assistance     General bed mobility comments: Bed pad used for assist with scooting. Pt required cues for sequencing and safety with transitioning to full sitting position. Assist required initially to maintain sitting balance but pt able to progress to sitting up with BUE support. Total assist to return to supine.   Transfers Overall transfer level: Needs assistance Equipment used: Rolling walker (2 wheeled) Transfers: Sit to/from Stand Sit to Stand: Max assist;+2 physical assistance          General transfer comment: Assist to power-up to full standing position and gain/maintain balance with RW. Pt was not able to achieve a full upright position due to pain.     Balance Overall balance assessment: Needs assistance Sitting-balance support: Feet supported Sitting balance-Leahy Scale: Poor     Standing balance support: Bilateral upper extremity supported;During functional activity Standing balance-Leahy Scale: Zero Standing balance comment: +2 assist                             ADL Overall ADL's : Needs assistance/impaired     Grooming: Moderate assistance;Sitting   Upper Body Bathing: Moderate assistance;Sitting   Lower Body Bathing: Maximal assistance;+2 for physical assistance;Sit to/from stand   Upper Body Dressing : Moderate assistance;Sitting Upper Body Dressing Details (indicate cue type and reason): Assist for balance and donning gown. Lower Body Dressing: Maximal assistance;+2 for physical assistance;Sit to/from stand                 General ADL Comments: Limited eval secondary to increased HR, decreased BP, and decreased SpO2. Following sit to stand from EOB x1 pt with decreased responsiveness and returned to supine: HR=160, BP=92/54, SpO2=88% on RA.     Vision     Perception     Praxis      Pertinent Vitals/Pain Pain Assessment: Faces Faces Pain Scale: Hurts little more Pain Location: LLE during mobility Pain Descriptors / Indicators: Operative site guarding;Sore Pain Intervention(s): Limited activity within patient's tolerance;Monitored during session;Repositioned     Hand Dominance Right   Extremity/Trunk Assessment Upper Extremity  Assessment Upper Extremity Assessment: Generalized weakness   Lower Extremity Assessment Lower Extremity Assessment: Defer to PT evaluation    Cervical / Trunk Assessment Cervical / Trunk Assessment: Normal   Communication Communication Communication: No difficulties   Cognition  Arousal/Alertness: Awake/alert Behavior During Therapy: WFL for tasks assessed/performed Overall Cognitive Status: Within Functional Limits for tasks assessed                     General Comments       Exercises       Shoulder Instructions      Home Living Family/patient expects to be discharged to:: Private residence Living Arrangements: Spouse/significant other Available Help at Discharge: Family;Available 24 hours/day Type of Home: House Home Access: Stairs to enter [employer] of Steps: 2 Entrance Stairs-Rails: Left Home Layout: One level     Bathroom Shower/Tub: Producer, television/film/video: Handicapped height Bathroom Accessibility: Yes   Home Equipment: Grab bars - tub/shower;Shower seat - built in (Possibly a walker)          Prior Functioning/Environment Level of Independence: Independent        Comments: Still drives, cooking./cleaning, shopping    OT Diagnosis: Generalized weakness;Acute pain   OT Problem List: Decreased strength;Impaired balance (sitting and/or standing);Decreased activity tolerance;Decreased knowledge of use of DME or AE;Decreased knowledge of precautions;Cardiopulmonary status limiting activity;Pain   OT Treatment/Interventions: Self-care/ADL training;Energy conservation;DME and/or AE instruction;Therapeutic activities;Patient/family education;Balance training    OT Goals(Current goals can be found in the care plan section) Acute Rehab OT Goals Patient Stated Goal: Get better, return to independence/cooking for her family OT Goal Formulation: With patient/family Time For Goal Achievement: 11/26/15 Potential to Achieve Goals: Good ADL Goals Pt Will Perform Grooming: with supervision;standing Pt Will Perform Lower Body Bathing: with supervision;sit to/from stand (with or without AE) Pt Will Perform Lower Body Dressing: with supervision;sit to/from stand (with or without AE) Pt Will Transfer to Toilet: with  supervision;ambulating;bedside commode (BSC over toilet) Pt Will Perform Toileting - Clothing Manipulation and hygiene: with supervision;sit to/from stand  OT Frequency: Min 2X/week   Barriers to D/C:            Co-evaluation PT/OT/SLP Co-Evaluation/Treatment: Yes Reason for Co-Treatment: Complexity of the patient's impairments (multi-system involvement);For patient/therapist safety PT goals addressed during session: Mobility/safety with mobility;Balance;Proper use of DME OT goals addressed during session: Other (comment) (mobility)      End of Session Equipment Utilized During Treatment: Gait belt;Rolling walker Nurse Communication: Other (comment) (PT notified RN)  Activity Tolerance: Treatment limited secondary to medical complications (Comment) Patient left: in bed;with call bell/phone within reach;with family/visitor present   Time: 1638-4536 OT Time Calculation (min): 26 min Charges:  OT General Charges $OT Visit: 1 Procedure OT Evaluation $OT Eval Moderate Complexity: 1 Procedure G-Codes:     Gaye Alken M.S., OTR/L Pager: [phone]  11/12/2015, 1:16 PM

## 2015-11-12 NOTE — Evaluation (Signed)
Physical Therapy Evaluation Patient Details Name: Kendra Hughes MRN: [id number] DOB: 1923-05-30 Today's Date: 11/12/2015   History of Present Illness  Pt is a 82 y/o female who sustained a displaced L femoral neck fracture after a mechanical fall on 11/09/15. Pt is now s/p L hip hemiarthroplasty and has no precautions.   Clinical Impression  Pt admitted with above diagnosis. Pt currently with functional limitations due to the deficits listed below (see PT Problem List). At the time of PT eval pt was able to attempt OOB but was limited by likely orthostatic hypotension event. See below for vitals reading. Pt was independent PTA and feel she would be a good candidate for CIR. Pt's son present in room and states they would like to look at rehab options closer to [facility]. Pt will benefit from skilled PT to increase their independence and safety with mobility to allow discharge to the venue listed below.       Follow Up Recommendations CIR;Supervision/Assistance - 24 hour    Equipment Recommendations  Rolling walker with 5" wheels    Recommendations for Other Services Rehab consult     Precautions / Restrictions Precautions Precautions: Fall Restrictions Weight Bearing Restrictions: Yes LLE Weight Bearing: Weight bearing as tolerated      Mobility  Bed Mobility Overal bed mobility: Needs Assistance;+2 for physical assistance Bed Mobility: Supine to Sit     Supine to sit: Max assist;+2 for physical assistance     General bed mobility comments: Bed pad used for assist with scooting. Pt required cues for sequencing and safety with transitioning to full sitting position. Assist required initially to maintain sitting balance but pt able to progress to sitting up with BUE support. Total assist to return to supine.   Transfers Overall transfer level: Needs assistance Equipment used: Rolling walker (2 wheeled) Transfers: Sit to/from Stand Sit to Stand: Max assist;+2 physical assistance          General transfer comment: Assist to power-up to full standing position and gain/maintain balance with RW. Pt was not able to achieve a full upright position due to pain.   Ambulation/Gait             General Gait Details: Unable at this time.   Stairs            Wheelchair Mobility    Modified Rankin (Stroke Patients Only)       Balance Overall balance assessment: Needs assistance Sitting-balance support: Feet supported;Bilateral upper extremity supported Sitting balance-Leahy Scale: Poor     Standing balance support: Bilateral upper extremity supported;During functional activity Standing balance-Leahy Scale: Zero Standing balance comment: +2 assist required.                              Pertinent Vitals/Pain Pain Assessment: Faces Faces Pain Scale: Hurts little more Pain Location: LLE during mobility.  Pain Descriptors / Indicators: Operative site guarding;Sore Pain Intervention(s): Limited activity within patient's tolerance;Monitored during session;Repositioned    Home Living Family/patient expects to be discharged to:: Private residence Living Arrangements: Spouse/significant other Available Help at Discharge: Family;Available 24 hours/day Type of Home: House Home Access: Stairs to enter Entrance Stairs-Rails: Left Entrance Stairs-Number of Steps: 2 Home Layout: One level Home Equipment: Grab bars - tub/shower;Shower seat - built in (Possibly a walker)      Prior Function Level of Independence: Independent         Comments: Still drives, cooking./cleaning, shopping  Hand Dominance   Dominant Hand: Right    Extremity/Trunk Assessment   Upper Extremity Assessment: Defer to OT evaluation           Lower Extremity Assessment: LLE deficits/detail   LLE Deficits / Details: Acute pain and decreased strength/AROM consistent with above mentioned procedure.   Cervical / Trunk Assessment: Normal  Communication    Communication: No difficulties  Cognition Arousal/Alertness: Awake/alert Behavior During Therapy: WFL for tasks assessed/performed Overall Cognitive Status: Within Functional Limits for tasks assessed                      General Comments General comments (skin integrity, edema, etc.): Upon sitting back on bed after stand, pt decreased verbal responses and eyes appeared to roll back. Pt was immediately returned to supine and pt began to increase verbal responses. Vitals taken and read as follows: BP 92/54, O2 88% on RA, HR fluctuating but up into the 160's at times. RN notified.     Exercises        Assessment/Plan    PT Assessment Patient needs continued PT services  PT Diagnosis Difficulty walking;Acute pain   PT Problem List Decreased strength;Decreased range of motion;Decreased activity tolerance;Decreased balance;Decreased mobility;Decreased knowledge of use of DME;Decreased safety awareness;Decreased knowledge of precautions;Pain  PT Treatment Interventions DME instruction;Gait training;Stair training;Functional mobility training;Therapeutic activities;Therapeutic exercise;Neuromuscular re-education;Patient/family education   PT Goals (Current goals can be found in the Care Plan section) Acute Rehab PT Goals Patient Stated Goal: Get better, return to independence/cooking for her family PT Goal Formulation: With patient/family Time For Goal Achievement: 11/26/15 Potential to Achieve Goals: Good    Frequency Min 5X/week   Barriers to discharge        Co-evaluation PT/OT/SLP Co-Evaluation/Treatment: Yes Reason for Co-Treatment: Complexity of the patient's impairments (multi-system involvement);For patient/therapist safety PT goals addressed during session: Mobility/safety with mobility;Balance;Proper use of DME         End of Session Equipment Utilized During Treatment: Gait belt Activity Tolerance: Treatment limited secondary to medical complications (Comment)  (Likely orthostatic hypotension ) Patient left: in bed;with call bell/phone within reach;with family/visitor present Nurse Communication: Mobility status;Other (comment) (Vitals)         Time: 5916-3846 PT Time Calculation (min) (ACUTE ONLY): 29 min   Charges:   PT Evaluation $PT Eval Moderate Complexity: 1 Procedure     PT G Codes:        Conni Slipper 2015/12/08, 1:00 PM   Conni Slipper, PT, DPT Acute Rehabilitation Services Pager: [phone]

## 2015-11-12 NOTE — Progress Notes (Addendum)
ANTICOAGULATION CONSULT NOTE  Pharmacy Consult for heparin/warfarin Indication: atrial fibrillation, VTE ppx s/p hip surgery on 5/6  No Known Allergies  Patient Measurements: Height: 5\' 5"  (165.1 cm) Weight: 155 lb (70.308 kg) IBW/kg (Calculated) : 57 HDWt: 70.kg  Vital Signs: Temp: 99 F (37.2 C) (05/07 0829) Temp Source: Oral (05/07 0829) BP: 100/71 mmHg (05/07 1020) Pulse Rate: 95 (05/07 1020)  Labs:  Recent Labs  11/10/15 0441 11/10/15 1415 11/11/15 0540 11/12/15 0314  HGB 12.6  --  12.4 11.4*  HCT 38.7  --  37.8 35.4*  PLT 212  --  209 230  APTT 38*  --   --   --   LABPROT 23.7* 23.8* 18.4* 17.1*  INR 2.13* 2.14* 1.53* 1.38  CREATININE 0.81  --   --  1.00    Estimated Creatinine Clearance: 36.8 mL/min (by C-G formula based on Cr of 1).   Medical History: Past Medical History  Diagnosis Date  . PAF (paroxysmal atrial fibrillation) (HCC)   . Hypertension   . Dyslipidemia   . DOE (dyspnea on exertion)   . History of nuclear stress test 10/2009    normal exam  . Chronic diastolic (congestive) heart failure (HCC)   . DVT (deep venous thrombosis) (HCC) "years ago"  . Fracture of left hip (HCC) 11/09/2015    "fell"  . Arthritis     "right knee" (11/10/2015)  . Rheumatoid arthritis (HCC) 1970    "it's in remission" (11/10/2015)    Assessment: 89 yof on warfarin pta for afib, admitted with hip fracture s/p fall. INR 2.13 on admit, reversed with vit k prior to OR. Pharmacy consulted to restart warfarin 5/6 for afib, VTE ppx s/p hip surgery on 5/6. High fall risk per MD note. 1.53>1.38. Hg down 11.4, plt wnl. No bleed documented. SCDs on.  PTA warfarin dose: 3mg  daily except 4mg  on Mon  ADDENDUM: Rx consulted to start heparin IV with no bolus. Will use lower HL goal with recent surgery. No bleed documented.  Goal of Therapy:  INR 2-3 Monitor platelets by anticoagulation protocol: Yes   Plan:  Start heparin at 1000 units/h (no bolus) Warfarin 5mg  x 1 dose  tonight 8h HL, daily HL/CBC/INR Monitor s/sx bleeding   , PharmD, [facility] Clinical Pharmacist Pager [phone] 11/12/2015 11:03 AM

## 2015-11-12 NOTE — Progress Notes (Addendum)
Inpatient Rehabilitation  Per PT and OT request patient was screened by Fae Pippin for appropriateness for an Inpatient Acute Rehab consult.  At this time we are recommending an Inpatient Rehab consult.  MD text paged to request order.  Please order if you are agreeable.    Charlane Ferretti., CCC/SLP Admission Coordinator  [facility] Inpatient Rehabilitation  Cell [phone]

## 2015-11-12 NOTE — Progress Notes (Addendum)
PROGRESS NOTE                                                                                                                                                                                                             Patient Demographics:    Kendra Hughes, is a 82 y.o. female, DOB - [birth year], QIH:[id number]  Admit date - 11/10/2015   Admitting Physician Lorretta Harp, MD  Outpatient Primary MD for the patient is Carmin Richmond, MD  LOS - 2  Outpatient Specialists: Dr. Rennis Golden (cardiology)  No chief complaint on file.    Brief Narrative   82 year old female with history of hypertension, hyperlipidemia, chronic A. fib on Coumadin, diastolic CHF, vitamin D deficiency presented with fall at home with pain in her left hip. Patient reports getting out of shower and trying to get out of her towel when she fell backwards and landed on her hip. Denied loss of consciousness or sustaining any other injuries. Patient reports that she has unsteady gait and does tend to sway backwards but holds on to things.  In the ED patient's vitals were stable.  WBC of 15.6 with normal electrolytes and INR of 2.2. X-ray of the left hip showed acute subcapsular fracture of left hip with superior migration of the left hip and femur.    S/P Left hemiarthroplasty with Dr. Magnus Ivan on 11/11/2015.   Subjective:   Intermittent a fib with RVR since last night.  Relative hypotension this morning when she attempted to get up with PT.  No chest pain, shortness of breath, or diaphoresis.  No palpitations.  She feels weak.  PO intake is down.  Mouth is dry.    Assessment  & Plan :    Principal Problem:   Closed left hip fracture (HCC) S/P Left hemiarthroplasty with Dr. Magnus Ivan on 11/11/2015. Patient does not have any signs or symptoms of CHF, chest pain or dizziness. She is on beta blocker which is continued perioperatively. TEE from 2015 shows EF of 55-60%. Patient  has moderate risk for postoperative cardiac complications. No preoperative cardiac workup recommended. She received Vitamin K pre-operatively.  Warfarin management per pharmacy.  Active Problems:   Persistent atrial fibrillation (HCC) Monitor on telemetry.  Warfarin management per pharmacy. She is a fall risk. We have had to hold metoprolol this AM for relative hypotension.  Will dose  cardizem this afternoon is blood pressure stable after small NS bolus.  Continue to monitor.    HTN (hypertension) Relative hypotension this AM.  Small NS bolus 250cc x 1 given.  Held AM dose of metoprolol.  Will give cardizem late.  Continue to monitor.    Hyperlipidemia Continue Lipitor    Chronic diastolic (congestive) heart failure (HCC) Euvolemic. Continue Lasix.  Caution with peri-operative fluids.  Leukocytosis Possibly reactive.   Code Status : Full code  Family Communication  : Husband and son at bedside  Disposition Plan  : Needs skilled nursing facility upon discharge. Family requesting [facility].  No discharge before Monday.    Barriers For Discharge :  Volume status, rate control post-operatively  Consults  :   Orthopedics (Dr. Magnus Ivan)  Procedures  :  S/P left hemiarthroplasty 5/6  DVT Prophylaxis  :  Warfarin  Lab Results  Component Value Date   PLT 230 11/12/2015    Antibiotics  :    Anti-infectives    Start     Dose/Rate Route Frequency Ordered Stop   11/11/15 1400  ceFAZolin (ANCEF) IVPB 2g/100 mL premix     2 g 200 mL/hr over 30 Minutes Intravenous Every 6 hours 11/11/15 1159 11/11/15 2054   11/11/15 0730  ceFAZolin (ANCEF) IVPB 2g/100 mL premix     2 g 200 mL/hr over 30 Minutes Intravenous  Once 11/11/15 0716 11/11/15 0743   11/11/15 0718  ceFAZolin (ANCEF) 2-4 GM/100ML-% IVPB    Comments:  Shireen Quan   : cabinet override      11/11/15 0718 11/11/15 0730        Objective:   Filed Vitals:   11/12/15 0035 11/12/15 0525 11/12/15 0829 11/12/15 1020   BP: 90/69 110/64 80/56 100/71  Pulse: 98 45 46 95  Temp: 98.5 F (36.9 C) 97.7 F (36.5 C) 99 F (37.2 C)   TempSrc: Oral Oral Oral   Resp: 16 16 11    Height:      Weight:      SpO2: 96% 98% 98%     Wt Readings from Last 3 Encounters:  11/10/15 70.308 kg (155 lb)  08/21/15 70.67 kg (155 lb 12.8 oz)  02/17/15 67.813 kg (149 lb 8 oz)     Intake/Output Summary (Last 24 hours) at 11/12/15 1323 Last data filed at 11/12/15 0830  Gross per 24 hour  Intake    180 ml  Output    450 ml  Net   -270 ml     Physical Exam  Gen: NAD, nontoxic appearing but pale HEENT: mucous membranes slightly dry, dry lips, dry tongue Pulm: CTA listening anteriorly CVS: Irregularly irregular, mildly tachycardic GI: soft, NT, ND, BS+ Musculoskeletal: limited mobility of left hip, small amount of blood on dressing.  Left thigh bruising anticipated but now indurations. CNS: Alert and oriented, nonfocal    Data Review:    CBC  Recent Labs Lab 11/10/15 0441 11/11/15 0540 11/12/15 0314  WBC 12.4* 7.7 8.7  HGB 12.6 12.4 11.4*  HCT 38.7 37.8 35.4*  PLT 212 209 230  MCV 91.9 91.7 91.7  MCH 29.9 30.1 29.5  MCHC 32.6 32.8 32.2  RDW 15.9* 15.8* 15.7*    Chemistries   Recent Labs Lab 11/10/15 0441 11/12/15 0314  NA 136 133*  K 4.5 4.2  CL 99* 95*  CO2 27 28  GLUCOSE 144* 126*  BUN 17 25*  CREATININE 0.81 1.00  CALCIUM 9.2 8.8*   Coagulation profile  Recent  Labs Lab 11/10/15 0441 11/10/15 1415 11/11/15 0540 11/12/15 0314  INR 2.13* 2.14* 1.53* 1.38  ------------------------------------------------------------------------------------------------------------------    Component Value Date/Time   BNP 245.9* 11/10/2015 0441    Inpatient Medications  Scheduled Meds: . antiseptic oral rinse  7 mL Mouth Rinse BID  . atorvastatin  10 mg Oral q1800  . diltiazem  120 mg Oral Daily  . fluticasone  1 spray Each Nare Daily  . folic acid  1 mg Oral Daily  . furosemide  40 mg  Oral Daily  . loratadine  10 mg Oral Daily  . metoprolol  50 mg Oral BID  . Vitamin D (Ergocalciferol)  50,000 Units Oral Weekly  . warfarin  5 mg Oral ONCE-1800  . Warfarin - Pharmacist Dosing Inpatient   Does not apply q1800   Continuous Infusions: . sodium chloride 50 mL/hr at 11/11/15 1245   PRN Meds:.acetaminophen **OR** acetaminophen, HYDROcodone-acetaminophen, menthol-cetylpyridinium **OR** phenol, methocarbamol **OR** methocarbamol (ROBAXIN)  IV, metoCLOPramide **OR** metoCLOPramide (REGLAN) injection, morphine injection, ondansetron **OR** ondansetron (ZOFRAN) IV, oxyCODONE-acetaminophen, senna-docusate  Micro Results Recent Results (from the past 240 hour(s))  Surgical pcr screen     Status: None   Collection Time: 11/10/15  6:06 PM  Result Value Ref Range Status   MRSA, PCR NEGATIVE NEGATIVE Final   Staphylococcus aureus NEGATIVE NEGATIVE Final    Comment:        The Xpert SA Assay (FDA approved for NASAL specimens in patients over 74 years of age), is one component of a comprehensive surveillance program.  Test performance has been validated by [facility] for patients greater than or equal to 52 year old. It is not intended to diagnose infection nor to guide or monitor treatment.     Radiology Reports Dg Hip Port Unilat With Pelvis 1v Left  11/10/2015  CLINICAL DATA:  Closed left hip fracture, initial visit. EXAM: DG HIP (WITH OR WITHOUT PELVIS) 1V PORT LEFT COMPARISON:  Left hip series of Nov 09, 2015 at 2200 hours. FINDINGS: The bony pelvis is diffusely osteopenic. No acute pelvic fracture is observed. There is an acute transverse subcapital fracture of the left hip with varus angulation. There is persistent superior migration of the femoral neck and a remainder of the femur superiorly with respect to the femoral head and acetabulum. There is narrowing of the left hip joint space similar to that seen on the right. IMPRESSION: Acute subcapsular fracture of the left hip  with superior migration of the left hip and femur. Electronically Signed   By: David  [place] M.D.   On: 11/10/2015 08:29   Dg Hip Operative Unilat W Or W/o Pelvis Left  11/11/2015  CLINICAL DATA:  Femoral neck fracture. Initial encounter. EXAM: OPERATIVE left HIP (WITH PELVIS IF PERFORMED) AP VIEWS TECHNIQUE: Fluoroscopic spot image(s) were submitted for interpretation post-operatively. COMPARISON:  Radiography from yesterday FINDINGS: Bipolar left hip hemiarthroplasty without evidence of periprosthetic fracture or dislocation. IMPRESSION: Fluoroscopy for left hip hemiarthroplasty.  No adverse finding. Electronically Signed   By: Marnee Spring M.D.   On: 11/11/2015 09:27    Time Spent in minutes  25   Jerene Bears M.D on 11/12/2015 at 1:23 PM   [facility] -  Office  [phone]     I received a page this afternoon re: inpatient rehab referral.  Patient really wants to rehab at [facility], as previously documented.  Will need to address with case manager in the morning.

## 2015-11-12 NOTE — Op Note (Signed)
NAMEKEATYN, LUCK                  ACCOUNT NO.:  [id number]  MEDICAL RECORD NO.:  [id number]  LOCATION:  [facility]                         FACILITY:  [facility]  PHYSICIAN:  Vanita Panda. Magnus Ivan, M.D.DATE OF BIRTH:  September 13, 1922  DATE OF PROCEDURE:  11/11/2015 DATE OF DISCHARGE:                              OPERATIVE REPORT   PREOPERATIVE DIAGNOSIS:  Left hip displaced femoral neck fracture.  POSTOPERATIVE DIAGNOSIS:  Left hip displaced femoral neck fracture.  PROCEDURE:  Left hip bipolar hemiarthroplasty through direct anterior approach.  IMPLANTS:  DePuy bipolar head measuring size 47 of the outer shell and 28 +1.5 inner ball, size 11 Corail femoral component with standard offset.  SURGEON:  Vanita Panda. Magnus Ivan, M.D.  ASSISTANT:  OR staff.  ANESTHESIA:  General.  ANTIBIOTICS:  A 2 g IV Ancef.  BLOOD LOSS:  A 150 to 200 mL.  COMPLICATIONS:  None.  INDICATIONS:  Ms. 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  She sustained an accidental mechanical fall on Thursday evening and was found to have a displaced left femoral neck fracture.  She was seen at outlying hospital and transferred to [facility] because this is where she sees her cardiac.  She does have a history of atrial fibrillation.  When she came, her INR was over 2 so they had to give her vitamin K and now she is presenting to the operating room today having had her blood levels improved for safely proceeding with the surgical intervention.  A thorough discussion of risks and benefits of surgery were had and her and her family did agree to proceed with surgery.  PROCEDURE DESCRIPTION:  After informed consent was obtained, appropriate left hip was marked.  She was brought to the operating room.  General anesthesia was obtained while she was on her stretcher.  A Foley catheter was placed and traction boots were placed on both of her feet. Next, she was placed supine on the Hana fracture  table.  The perineal post in place and both legs in inline skeletal traction, but no traction applied.  Her left operative hip was prepped and draped with DuraPrep and sterile drapes.  Time-out was called.  She was identified as correct patient and correct left hip.  We then made an incision inferior and posterior to the anterior superior iliac spine and carried this obliquely down the leg.  We dissected down to the tensor fascia lata muscle.  The tensor fascia was then divided longitudinally, so we could proceed with a direct anterior approach to the hip.  We identified and cauterized the circumflex vessels and then identified the hip capsule, opening up the hip capsule finding a large hematoma consistent with a hip fracture.  We placed Cobra retractors around the femoral neck and within the joint capsule.  We then made our femoral neck cut with an oscillating saw proximal to the lesser trochanter and completed this on osteotome.  I placed a corkscrew guide in the femoral head and removed the femoral head its entirety.  We removed bony debris from the acetabulum.  We left the remaining structures along the capsule and the labrum.  We then trialed  for a 47 head.  We felt this was the appropriate size.  Attention was then turned to the femur with the femur externally rotated to 120 degrees, extended and adducted.  We were to place a Mueller retractor medially and a Hohmann retractor behind the greater trochanter.  We released the lateral joint capsule and used a box cutting osteotome to enter femoral canal and a rongeur lateralize. We then began broaching from a size 8 broach using the Corail broaching system up to a size 11.  With the size 11, we trialed a 47 bipolar head with 28 ball and 1.5 length and reduced this in the acetabulum.  We were pleased with leg length, offset, and range of motion.  We then dislocated the hip and removed the trial components.  We irrigated the soft tissue  with normal saline solution.  We placed the real Corail femoral component size 11 with standard offset, the real 47/28+ 1.5 hip ball reduced this in the acetabulum and we were pleased with stability, leg length, offset, again and range of motion.  We then irrigated the soft tissues normal saline solution.  We were able to close the joint capsule tight with interrupted #1 Ethibond suture followed by running #1 Vicryl the tensor fascia, 0 Vicryl in the deep tissue, 2-0 Vicryl subcutaneous tissue, interrupted staples on the skin.  Xeroform and Aquacel dressing was applied.  She was then awakened, extubated, and taken to recovery room in stable condition.  All final counts were correct.  There were no complications noted.     Vanita Panda. Magnus Ivan, M.D.     CYB/MEDQ  D:  11/11/2015  T:  11/11/2015  Job:  [id number]

## 2015-11-12 NOTE — Discharge Instructions (Signed)
You may put full weight on your left hip. Only ambulate with assistance. Leave your left hip dressing in place until your outpatient orthopedic follow-up.
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
## 2015-11-12 NOTE — Progress Notes (Signed)
Subjective: 1 Day Post-Op Procedure(s) (LRB):  HEMI ARTHROPLASTY ANTERIOR APPROACH LEFT (Left) Patient reports pain as moderate. Up and eating this am.  Does feel better than pre-op.  Minimal acute blood loss anemia from surgery.  Objective: Vital signs in last 24 hours: Temp:  [97.7 F (36.5 C)-100 F (37.8 C)] 97.7 F (36.5 C) (05/07 0525) Pulse Rate:  [25-102] 45 (05/07 0525) Resp:  [12-19] 16 (05/07 0525) BP: (89-121)/(48-92) 110/64 mmHg (05/07 0525) SpO2:  [91 %-100 %] 98 % (05/07 0525)  Intake/Output from previous day: 05/06 0701 - 05/07 0700 In: 60 [P.O.:60] Out: 1000 [Urine:850; Blood:150] Intake/Output this shift:     Recent Labs  11/10/15 0441 11/11/15 0540 11/12/15 0314  HGB 12.6 12.4 11.4*    Recent Labs  11/11/15 0540 11/12/15 0314  WBC 7.7 8.7  RBC 4.12 3.86*  HCT 37.8 35.4*  PLT 209 230    Recent Labs  11/10/15 0441 11/12/15 0314  NA 136 133*  K 4.5 4.2  CL 99* 95*  CO2 27 28  BUN 17 25*  CREATININE 0.81 1.00  GLUCOSE 144* 126*  CALCIUM 9.2 8.8*    Recent Labs  11/11/15 0540 11/12/15 0314  INR 1.53* 1.38    Sensation intact distally Intact pulses distally Dorsiflexion/Plantar flexion intact Incision: dressing C/D/I  Assessment/Plan: 1 Day Post-Op Procedure(s) (LRB):  HEMI ARTHROPLASTY ANTERIOR APPROACH LEFT (Left) Up with therapy  - WBAT left hip Will likely need short-term skilled nursing post acute stay.  Kendra Hughes Y 11/12/2015, 7:36 AM

## 2015-11-13 ENCOUNTER — Encounter (HOSPITAL_COMMUNITY): Payer: Self-pay | Admitting: Orthopaedic Surgery

## 2015-11-13 DIAGNOSIS — I5032 Chronic diastolic (congestive) heart failure: Secondary | ICD-10-CM

## 2015-11-13 DIAGNOSIS — S72002A Fracture of unspecified part of neck of left femur, initial encounter for closed fracture: Principal | ICD-10-CM

## 2015-11-13 DIAGNOSIS — I509 Heart failure, unspecified: Secondary | ICD-10-CM

## 2015-11-13 DIAGNOSIS — I10 Essential (primary) hypertension: Secondary | ICD-10-CM

## 2015-11-13 DIAGNOSIS — R000 Tachycardia, unspecified: Secondary | ICD-10-CM

## 2015-11-13 DIAGNOSIS — D62 Acute posthemorrhagic anemia: Secondary | ICD-10-CM | POA: Insufficient documentation

## 2015-11-13 DIAGNOSIS — I9581 Postprocedural hypotension: Secondary | ICD-10-CM | POA: Insufficient documentation

## 2015-11-13 DIAGNOSIS — S72009D Fracture of unspecified part of neck of unspecified femur, subsequent encounter for closed fracture with routine healing: Secondary | ICD-10-CM

## 2015-11-13 DIAGNOSIS — I9589 Other hypotension: Secondary | ICD-10-CM

## 2015-11-13 DIAGNOSIS — Z9660 Presence of unspecified orthopedic joint implant: Secondary | ICD-10-CM

## 2015-11-13 DIAGNOSIS — I4891 Unspecified atrial fibrillation: Secondary | ICD-10-CM

## 2015-11-13 DIAGNOSIS — Z96649 Presence of unspecified artificial hip joint: Secondary | ICD-10-CM | POA: Insufficient documentation

## 2015-11-13 DIAGNOSIS — S72002C Fracture of unspecified part of neck of left femur, initial encounter for open fracture type IIIA, IIIB, or IIIC: Secondary | ICD-10-CM

## 2015-11-13 DIAGNOSIS — G8918 Other acute postprocedural pain: Secondary | ICD-10-CM | POA: Insufficient documentation

## 2015-11-13 DIAGNOSIS — E871 Hypo-osmolality and hyponatremia: Secondary | ICD-10-CM

## 2015-11-13 DIAGNOSIS — I481 Persistent atrial fibrillation: Secondary | ICD-10-CM

## 2015-11-13 DIAGNOSIS — E559 Vitamin D deficiency, unspecified: Secondary | ICD-10-CM | POA: Insufficient documentation

## 2015-11-13 DIAGNOSIS — S72002D Fracture of unspecified part of neck of left femur, subsequent encounter for closed fracture with routine healing: Secondary | ICD-10-CM

## 2015-11-13 LAB — BASIC METABOLIC PANEL
ANION GAP: 8 (ref 5–15)
BUN: 16 mg/dL (ref 6–20)
CALCIUM: 8 mg/dL — AB (ref 8.9–10.3)
CO2: 26 mmol/L (ref 22–32)
CREATININE: 0.73 mg/dL (ref 0.44–1.00)
Chloride: 98 mmol/L — ABNORMAL LOW (ref 101–111)
GFR calc Af Amer: >60 mL/min (ref >60)
GLUCOSE: 113 mg/dL — AB (ref 65–99)
Potassium: 3.8 mmol/L (ref 3.5–5.1)
Sodium: 132 mmol/L — ABNORMAL LOW (ref 135–145)

## 2015-11-13 LAB — CBC
HEMATOCRIT: 28.1 % — AB (ref 36.0–46.0)
Hemoglobin: 9 g/dL — ABNORMAL LOW (ref 12.0–15.0)
MCH: 29.2 pg (ref 26.0–34.0)
MCHC: 32 g/dL (ref 30.0–36.0)
MCV: 91.2 fL (ref 78.0–100.0)
PLATELETS: 178 10*3/uL (ref 150–400)
RBC: 3.08 MIL/uL — ABNORMAL LOW (ref 3.87–5.11)
RDW: 15.5 % (ref 11.5–15.5)
WBC: 8.9 10*3/uL (ref 4.0–10.5)

## 2015-11-13 LAB — TROPONIN I
Troponin I: 0.05 ng/mL — ABNORMAL HIGH (ref <0.031)
Troponin I: 0.05 ng/mL — ABNORMAL HIGH (ref <0.031)

## 2015-11-13 LAB — PROTIME-INR
INR: 1.64 — ABNORMAL HIGH (ref 0.00–1.49)
Prothrombin Time: 19.5 seconds — ABNORMAL HIGH (ref 11.6–15.2)

## 2015-11-13 LAB — HEPARIN LEVEL (UNFRACTIONATED): HEPARIN UNFRACTIONATED: 0.42 [IU]/mL (ref 0.30–0.70)

## 2015-11-13 MED ORDER — POLYETHYLENE GLYCOL 3350 17 G PO PACK
17.0000 g | PACK | Freq: Two times a day (BID) | ORAL | Status: DC
  Administered 2015-11-13 – 2015-11-15 (×3): 17 g via ORAL
  Filled 2015-11-13 (×5): qty 1

## 2015-11-13 MED ORDER — DIGOXIN 125 MCG PO TABS
0.1250 mg | ORAL_TABLET | Freq: Every day | ORAL | Status: DC
  Administered 2015-11-13 – 2015-11-16 (×4): 0.125 mg via ORAL
  Filled 2015-11-13 (×4): qty 1

## 2015-11-13 MED ORDER — METOPROLOL TARTRATE 12.5 MG HALF TABLET
12.5000 mg | ORAL_TABLET | Freq: Four times a day (QID) | ORAL | Status: DC
  Administered 2015-11-13 – 2015-11-16 (×12): 12.5 mg via ORAL
  Filled 2015-11-13 (×13): qty 1

## 2015-11-13 NOTE — Progress Notes (Signed)
SUBJECTIVE:  No SOB.  Only mild pain.    PHYSICAL EXAM Filed Vitals:   11/12/15 1644 11/12/15 1959 11/12/15 2346 11/13/15 0435  BP: 98/63 106/56 121/96 97/41  Pulse: 133 112  106  Temp:  99 F (37.2 C) 99.7 F (37.6 C) 98.9 F (37.2 C)  TempSrc:  Oral Oral Oral  Resp:  12 16 16   Height:      Weight:      SpO2: 96% 98% 96% 96%   General:  No acute distress Lungs:  Few dependent crackles Heart:  Irregular Abdomen:  Positive bowel sounds, no rebound no guarding Extremities:  No edema   LABS: Lab Results  Component Value Date   TROPONINI 0.05* 11/13/2015   Results for orders placed or performed during the hospital encounter of 11/10/15 (from the past 24 hour(s))  CBC     Status: Abnormal   Collection Time: 11/12/15  1:54 PM  Result Value Ref Range   WBC 8.6 4.0 - 10.5 K/uL   RBC 3.62 (L) 3.87 - 5.11 MIL/uL   Hemoglobin 10.8 (L) 12.0 - 15.0 g/dL   HCT 01/12/16 (L) 84.1 - 66.0 %   MCV 92.0 78.0 - 100.0 fL   MCH 29.8 26.0 - 34.0 pg   MCHC 32.4 30.0 - 36.0 g/dL   RDW 63.0 16.0 - 10.9 %   Platelets 212 150 - 400 K/uL  TSH     Status: None   Collection Time: 11/12/15  6:00 PM  Result Value Ref Range   TSH 3.320 0.350 - 4.500 uIU/mL  T4, free     Status: None   Collection Time: 11/12/15  6:00 PM  Result Value Ref Range   Free T4 1.04 0.61 - 1.12 ng/dL  Troponin I (q 6hr x 3)     Status: None   Collection Time: 11/12/15  6:00 PM  Result Value Ref Range   Troponin I 0.03 <0.031 ng/mL  Urinalysis, Routine w reflex microscopic (not at [facility])     Status: Abnormal   Collection Time: 11/12/15  7:49 PM  Result Value Ref Range   Color, Urine YELLOW YELLOW   APPearance CLEAR CLEAR   Specific Gravity, Urine 1.024 1.005 - 1.030   pH 5.5 5.0 - 8.0   Glucose, UA NEGATIVE NEGATIVE mg/dL   Hgb urine dipstick LARGE (A) NEGATIVE   Bilirubin Urine NEGATIVE NEGATIVE   Ketones, ur NEGATIVE NEGATIVE mg/dL   Protein, ur 01/12/16 (A) NEGATIVE mg/dL   Nitrite NEGATIVE NEGATIVE   Leukocytes, UA NEGATIVE NEGATIVE  Urine microscopic-add on     Status: Abnormal   Collection Time: 11/12/15  7:49 PM  Result Value Ref Range   Squamous Epithelial / LPF 0-5 (A) NONE SEEN   WBC, UA 6-30 0 - 5 WBC/hpf   RBC / HPF 6-30 0 - 5 RBC/hpf   Bacteria, UA RARE (A) NONE SEEN  Brain natriuretic peptide     Status: Abnormal   Collection Time: 11/12/15  9:03 PM  Result Value Ref Range   B Natriuretic Peptide 457.9 (H) 0.0 - 100.0 pg/mL  Troponin I (q 6hr x 3)     Status: Abnormal   Collection Time: 11/12/15 11:18 PM  Result Value Ref Range   Troponin I 0.05 (H) <0.031 ng/mL  Heparin level (unfractionated)     Status: None   Collection Time: 11/13/15  1:59 AM  Result Value Ref Range   Heparin Unfractionated 0.42 0.30 - 0.70 IU/mL  Protime-INR  Status: Abnormal   Collection Time: 11/13/15  5:06 AM  Result Value Ref Range   Prothrombin Time 19.5 (H) 11.6 - 15.2 seconds   INR 1.64 (H) 0.00 - 1.49  CBC     Status: Abnormal   Collection Time: 11/13/15  5:06 AM  Result Value Ref Range   WBC 8.9 4.0 - 10.5 K/uL   RBC 3.08 (L) 3.87 - 5.11 MIL/uL   Hemoglobin 9.0 (L) 12.0 - 15.0 g/dL   HCT 40.9 (L) 81.1 - 91.4 %   MCV 91.2 78.0 - 100.0 fL   MCH 29.2 26.0 - 34.0 pg   MCHC 32.0 30.0 - 36.0 g/dL   RDW 78.2 95.6 - 21.3 %   Platelets 178 150 - 400 K/uL  Basic metabolic panel     Status: Abnormal   Collection Time: 11/13/15  5:06 AM  Result Value Ref Range   Sodium 132 (L) 135 - 145 mmol/L   Potassium 3.8 3.5 - 5.1 mmol/L   Chloride 98 (L) 101 - 111 mmol/L   CO2 26 22 - 32 mmol/L   Glucose, Bld 113 (H) 65 - 99 mg/dL   BUN 16 6 - 20 mg/dL   Creatinine, Ser 0.86 0.44 - 1.00 mg/dL   Calcium 8.0 (L) 8.9 - 10.3 mg/dL   GFR calc non Af Amer >60 >60 mL/min   GFR calc Af Amer >60 >60 mL/min   Anion gap 8 5 - 15  Troponin I (q 6hr x 3)     Status: Abnormal   Collection Time: 11/13/15  5:06 AM  Result Value Ref Range   Troponin I 0.05 (H) <0.031 ng/mL    Intake/Output Summary  (Last 24 hours) at 11/13/15 0740 Last data filed at 11/13/15 0615  Gross per 24 hour  Intake 2358.33 ml  Output    750 ml  Net 1608.33 ml    ASSESSMENT AND PLAN:  ATRIAL FIB WITH RVR:   Rate is elevated.  I am going to hold Lasix to try to increase the BP to start a low dose beta blocker.  We are going to need to accept a slightly increased rate given pain and need for PT.  Try to start low dose scheduled beta blocker.  Start PO dig.   MILDLY ELEVATED TROPONIN:  Nonspecific.  Not a NSTEMI.   HeFPEF:    Hold Lasix today only.   Watch volume closely.   Rollene Rotunda 11/13/2015 7:40 AM

## 2015-11-13 NOTE — Patient Instructions (Addendum)
Ankle Pump    Doble los talones [place] y [place], alternando los pies. Repita ____ veces. Haga ____ sesiones por da.    Heel Slides    Deslice el taln izquierdo [employer]. Sostenga durante ___ segundos. Deslcelo de nuevo a la posicin de rodilla extendida. Repita ___ veces. Realice ___ veces al da. Repita con la otra pierna.   Quad Set    Contraiga lentamente el msculo anterior del muslo de la pierna estirada mientras cuenta en voz alta hasta ____. Repita con la pierna opuesta. Repita la serie ____ veces. Haga ____ sesiones por da.  Short Arc [employer] una lata grande o una toalla enrollada abajo de la pierna. Enderece la rodilla y la pierna. Mantenga ____ segundos. Repita con la otra pierna. Repita ____ veces. Haga ____ sesiones por da.  Straight Leg Raise    Doble una pierna. [place] la otra pierna estirada ____ cms. Exhale y [employer] msculos del muslo mientras levanta la pierna. Repita con la otra pierna. Repita la serie ____ veces. Haga ____ sesiones por da.  Hip Abduction    Deslice una pierna hacia el costado. Mantenga la rtula apuntando hacia el techo. Lentamente deslice la pierna nuevamente hacia la almohada. Repita con la otra pierna. Repita la serie ____ veces. Haga ____ sesiones por da.  Long Arc Quads    Sentado con la parte posterior de la rodilla al borde de la silla, pie sin apoyar, espalda derecha. Inhale despus mientras exhala, extienda la pierna. Sostenga por cuenta de 1. Descienda la pierna lentamente. Repita ___ veces por rutina. Realice ___ rutinas por sesin. Realice ___ sesiones por da.  Marching (Standing)    De pie con apoyo. Eleve la rodilla [place] arriba. Sostenga durante ___ segundos. Relaje durante ___ segundos. Repita ___ veces. Realice ___ veces al da. Repita con la otra pierna.   Hip Abduction (Standing)    De pie con apoyo. Lleve la pierna derecha hacia el lado,  manteniendo la punta del pie hacia adelante. Sostenga durante ___ segundos. Relaje durante ___ segundos. Repita ___ veces. Realice ___ veces al da. Repita con la otra pierna.   Hip Extension (Standing)    De pie con apoyo. Eleve la pierna derecha hacia atrs con la rodilla extendida. Sostenga durante ___ segundos. Relaje durante ___ segundos. Repita ___ veces. Realice ___ veces al da. Repita con la otra pierna.   Knee Flexion (Standing)    De pie con apoyo. Flexione la rodilla derecha sin mover la cadera. Sostenga durante ___ segundos. Relaje durante ___ segundos. Repita ___ veces. Realice ___ veces al da. Repita con la otra pierna.

## 2015-11-13 NOTE — Progress Notes (Signed)
ANTICOAGULATION CONSULT NOTE  Pharmacy Consult for heparin / Coumadin Indication: atrial fibrillation, VTE ppx s/p hip surgery on 5/6  No Known Allergies  Patient Measurements: Height: 5\' 5"  (165.1 cm) Weight: 155 lb (70.308 kg) IBW/kg (Calculated) : 57 HDWt: 70.kg  Vital Signs: Temp: 98.9 F (37.2 C) (05/08 0435) Temp Source: Oral (05/08 0435) BP: 97/41 mmHg (05/08 0435) Pulse Rate: 106 (05/08 0435)  Labs:  Recent Labs  11/11/15 0540 11/12/15 0314 11/12/15 1354 11/12/15 1800 11/12/15 2318 11/13/15 0159 11/13/15 0506  HGB 12.4 11.4* 10.8*  --   --   --  9.0*  HCT 37.8 35.4* 33.3*  --   --   --  28.1*  PLT 209 230 212  --   --   --  178  LABPROT 18.4* 17.1*  --   --   --   --  19.5*  INR 1.53* 1.38  --   --   --   --  1.64*  HEPARINUNFRC  --   --   --   --   --  0.42  --   CREATININE  --  1.00  --   --   --   --  0.73  TROPONINI  --   --   --  0.03 0.05*  --  0.05*    Estimated Creatinine Clearance: 46 mL/min (by C-G formula based on Cr of 0.73).     Assessment: 89 yof on warfarin pta for afib, admitted with hip fracture s/p fall. INR 2.13 on admit, reversed with vit k prior to OR. Pharmacy consulted to restart warfarin 5/6 for afib, VTE ppx s/p hip surgery on 5/6. High fall risk per MD note. 1.53>1.38. Hg down 11.4, plt wnl. No bleed documented. SCDs on. Heparin (re)started 5/7 pm.  PTA warfarin dose: 3mg  daily except 4mg  on Mon HL is therapeutic at 0.42 on heparin infusion of 1000 unts/hr. CBC stable.   INR = 1.64, trending up  Goal of Therapy:  INR 2-3 Heparin level 0.3-0.7 units/ml Monitor platelets by anticoagulation protocol: Yes   Plan:  1. Continue heparin at 1000 units/h 2. Coumadin 5 mg po x 1 today 3. Daily HL/CBC/INR 4. Monitor s/sx bleeding  Thank you , PharmD [phone]  11/13/2015, 8:46 AM

## 2015-11-13 NOTE — Progress Notes (Signed)
Physical Therapy Treatment Patient Details Name: Kendra Hughes MRN: [id number] DOB: [birth year] Today's Date: 11/13/2015    History of Present Illness Pt is a 82 y/o female who sustained a displaced L femoral neck fracture after a mechanical fall on 11/09/15. Pt is now s/p L hip hemiarthroplasty and has no precautions.     PT Comments    Pt progressing towards physical therapy goals. Was able to perform transfers with +2 assist and use of RW for support. Continue to feel that pt would be an excellent CIR candidate. Will continue to follow.   Follow Up Recommendations  CIR;Supervision/Assistance - 24 hour     Equipment Recommendations  Rolling walker with 5" wheels    Recommendations for Other Services Rehab consult     Precautions / Restrictions Precautions Precautions: Fall Restrictions Weight Bearing Restrictions: Yes LLE Weight Bearing: Weight bearing as tolerated    Mobility  Bed Mobility Overal bed mobility: Needs Assistance;+2 for physical assistance Bed Mobility: Supine to Sit     Supine to sit: Mod assist;+2 for physical assistance     General bed mobility comments: Bed pad used for assist with scooting. Pt required cues for sequencing and safety with transitioning to full sitting position. Assist required initially to maintain sitting balance but pt able to progress to sitting up with BUE support.  Transfers Overall transfer level: Needs assistance Equipment used: Rolling walker (2 wheeled) Transfers: Sit to/from [employer] Sit to Stand: Max assist;+2 physical assistance Stand pivot transfers: Max assist;+2 physical assistance       General transfer comment: Assist to power-up to full standing position and gain/maintain balance with RW. Increased time required to achieve full stand. Pt was able to take a few pivotal steps around to the recliner chair with RW and +2 assist.   Ambulation/Gait         Gait velocity: Decreased Gait velocity  interpretation: Below normal speed for age/gender General Gait Details: Unable at this time.    Stairs            Wheelchair Mobility    Modified Rankin (Stroke Patients Only)       Balance Overall balance assessment: Needs assistance Sitting-balance support: Feet supported;No upper extremity supported Sitting balance-Leahy Scale: Poor     Standing balance support: Bilateral upper extremity supported;During functional activity Standing balance-Leahy Scale: Zero                      Cognition Arousal/Alertness: Awake/alert Behavior During Therapy: WFL for tasks assessed/performed Overall Cognitive Status: Within Functional Limits for tasks assessed                      Exercises      General Comments        Pertinent Vitals/Pain Pain Assessment: Faces Faces Pain Scale: Hurts little more Pain Location: L hip Pain Descriptors / Indicators: Operative site guarding Pain Intervention(s): Limited activity within patient's tolerance;Monitored during session;Repositioned    Home Living                      Prior Function            PT Goals (current goals can now be found in the care plan section) Acute Rehab PT Goals Patient Stated Goal: Get better, return to independence/cooking for her family PT Goal Formulation: With patient/family Time For Goal Achievement: 11/26/15 Potential to Achieve Goals: Good Progress towards PT goals: Progressing toward goals  Frequency  Min 5X/week    PT Plan Current plan remains appropriate    Co-evaluation             End of Session Equipment Utilized During Treatment: Gait belt Activity Tolerance: Patient limited by fatigue (weakness) Patient left: in chair;with chair alarm set;with call bell/phone within reach     Time: 1142-1201 PT Time Calculation (min) (ACUTE ONLY): 19 min  Charges:  $Gait Training: 8-22 mins                    G Codes:      Conni Slipper 11-15-2015, 2:13 PM    Conni Slipper, PT, DPT Acute Rehabilitation Services Pager: [phone]

## 2015-11-13 NOTE — Progress Notes (Signed)
ANTICOAGULATION CONSULT NOTE  Pharmacy Consult for heparin Indication: atrial fibrillation, VTE ppx s/p hip surgery on 5/6  No Known Allergies  Patient Measurements: Height: 5\' 5"  (165.1 cm) Weight: 155 lb (70.308 kg) IBW/kg (Calculated) : 57 HDWt: 70.kg  Vital Signs: Temp: 99.7 F (37.6 C) (05/07 2346) Temp Source: Oral (05/07 2346) BP: 121/96 mmHg (05/07 2346) Pulse Rate: 112 (05/07 1959)  Labs:  Recent Labs  11/10/15 0441 11/10/15 1415 11/11/15 0540 11/12/15 0314 11/12/15 1354 11/12/15 1800 11/12/15 2318 11/13/15 0159  HGB 12.6  --  12.4 11.4* 10.8*  --   --   --   HCT 38.7  --  37.8 35.4* 33.3*  --   --   --   PLT 212  --  209 230 212  --   --   --   APTT 38*  --   --   --   --   --   --   --   LABPROT 23.7* 23.8* 18.4* 17.1*  --   --   --   --   INR 2.13* 2.14* 1.53* 1.38  --   --   --   --   HEPARINUNFRC  --   --   --   --   --   --   --  0.42  CREATININE 0.81  --   --  1.00  --   --   --   --   TROPONINI  --   --   --   --   --  0.03 0.05*  --     Estimated Creatinine Clearance: 36.8 mL/min (by C-G formula based on Cr of 1).   Medical History: Past Medical History  Diagnosis Date  . PAF (paroxysmal atrial fibrillation) (HCC)   . Hypertension   . Dyslipidemia   . DOE (dyspnea on exertion)   . History of nuclear stress test 10/2009    normal exam  . Chronic diastolic (congestive) heart failure (HCC)   . DVT (deep venous thrombosis) (HCC) "years ago"  . Fracture of left hip (HCC) 11/09/2015    "fell"  . Arthritis     "right knee" (11/10/2015)  . Rheumatoid arthritis (HCC) 1970    "it's in remission" (11/10/2015)    Assessment: 89 yof on warfarin pta for afib, admitted with hip fracture s/p fall. INR 2.13 on admit, reversed with vit k prior to OR. Pharmacy consulted to restart warfarin 5/6 for afib, VTE ppx s/p hip surgery on 5/6. High fall risk per MD note. 1.53>1.38. Hg down 11.4, plt wnl. No bleed documented. SCDs on. Heparin (re)started 5/7 pm.  PTA  warfarin dose: 3mg  daily except 4mg  on Mon  Initial HL is therapeutic at 0.42 on heparin infusion of 1000 unts/hr. CBC stable.   Goal of Therapy:  INR 2-3 Heparin level 0.3-0.7 units/ml Monitor platelets by anticoagulation protocol: Yes   Plan:  1. Continue heparin at 1000 units/h 2. Confirmatory 8h level  3. Daily HL/CBC/INR 4. Monitor s/sx bleeding   , PharmD, BCPS 11/13/2015, 3:27 AM Pager: [phone]

## 2015-11-13 NOTE — Consult Note (Signed)
Physical Medicine and Rehabilitation Consult Reason for Consult: Left displaced femoral neck fracture Referring Physician: [facility]   HPI: Kendra Hughes is a 82 y.o. right handed female with history of hypertension, chronic atrial fibrillation maintained on Coumadin, diastolic congestive heart failure, vitamin D deficiency. Patient lives with spouse independent prior to admission. She still drives. One level home with 2 steps to entry. Presented 11/10/2015 after mechanical fall and presented to [facility]. X-rays and imaging revealed left hip displaced femoral neck fracture. She was transferred to [facility] and underwent left hip bipolar hemiarthroplasty through direct anterior approach 11/11/2015 per Dr. Magnus Ivan. Hospital course pain management. Weightbearing as tolerated. Chronic Coumadin resumed with intravenous heparin for bridging. Acute blood loss anemia 9.0 and monitor. Physical occupational therapy evaluation completed 11/12/2015 with recommendations of physical medicine rehabilitation consult.  Review of Systems  Constitutional: Negative for fever and chills.  HENT: Negative for hearing loss.   Eyes: Negative for blurred vision and double vision.  Respiratory: Negative for cough.   Cardiovascular: Positive for leg swelling. Negative for chest pain and palpitations.  Gastrointestinal: Positive for constipation. Negative for nausea and vomiting.  Genitourinary: Negative for dysuria and hematuria.  Musculoskeletal: Positive for myalgias, joint pain and falls.  Skin: Negative for rash.  Neurological: Negative for seizures, weakness and headaches.  All other systems reviewed and are negative.  Past Medical History  Diagnosis Date  . PAF (paroxysmal atrial fibrillation) (HCC)   . Hypertension   . Dyslipidemia   . DOE (dyspnea on exertion)   . History of nuclear stress test 10/2009    normal exam  . Chronic diastolic (congestive) heart failure (HCC)   . DVT (deep  venous thrombosis) (HCC) "years ago"  . Fracture of left hip (HCC) 11/09/2015    "fell"  . Arthritis     "right knee" (11/10/2015)  . Rheumatoid arthritis (HCC) 1970    "it's in remission" (11/10/2015)   Past Surgical History  Procedure Laterality Date  . Abdominal hysterectomy  1996  . Breast biopsy Right 1984    "benign"  . Transthoracic echocardiogram  07/22/2012    EF 55-60%; mild AV regurg; mild MR; mild-mod TR  . Tee without cardioversion N/A 11/01/2013    Procedure: TRANSESOPHAGEAL ECHOCARDIOGRAM (TEE);  Surgeon: Chrystie Nose, MD;  Location: [facility] ENDOSCOPY;  Service: Cardiovascular;  Laterality: N/A;  . Cardioversion N/A 11/01/2013    Procedure: CARDIOVERSION;  Surgeon: Chrystie Nose, MD;  Location: [facility] ENDOSCOPY;  Service: Cardiovascular;  Laterality: N/A;  . Tonsillectomy    . Cataract extraction w/ intraocular lens  implant, bilateral Bilateral 2003  . Bladder suspension  X 2  . Rectocele repair     Family History  Problem Relation Age of Onset  . Heart disease Mother    Social History:  reports that she has never smoked. She has never used smokeless tobacco. She reports that she does not drink alcohol or use illicit drugs. Allergies: No Known Allergies Medications Prior to Admission  Medication Sig Dispense Refill  . atorvastatin (LIPITOR) 20 MG tablet Take 20 mg by mouth daily.    . cholecalciferol (VITAMIN D) 1000 units tablet Take 1,000 Units by mouth daily.    [name] diltiazem (CARDIZEM CD) 120 MG 24 hr capsule Take 1 capsule (120 mg total) by mouth daily. 90 capsule 3  . Fexofenadine HCl (ALLEGRA PO) Take 1 tablet by mouth as needed (allergies).     . Fluticasone Propionate (FLONASE NA) Place 1 spray  into the nose as needed (allergies).     . folic acid (FOLVITE) 800 MCG tablet Take 800 mcg by mouth daily.    . furosemide (LASIX) 40 MG tablet Take 1 tablet (40 mg total) by mouth daily. 30 tablet 3  . metoprolol (LOPRESSOR) 50 MG tablet Take 1 tablet (50 mg total) by mouth  2 (two) times daily. 180 tablet 3  . warfarin (COUMADIN) 1 MG tablet Take 1 mg by mouth once a week. Monday    . warfarin (COUMADIN) 3 MG tablet Take 3 mg by mouth daily.       Home: Home Living Family/patient expects to be discharged to:: Private residence Living Arrangements: Spouse/significant other Available Help at Discharge: Family, Available 24 hours/day Type of Home: House Home Access: Stairs to enter [employer] of Steps: 2 Entrance Stairs-Rails: Left Home Layout: One level Bathroom Shower/Tub: Health visitor: Handicapped height Bathroom Accessibility: Yes Home Equipment: Grab bars - tub/shower, Shower seat - built in (Possibly a walker)  Functional History: Prior Function Level of Independence: Independent Comments: Still drives, cooking./cleaning, shopping Functional Status:  Mobility: Bed Mobility Overal bed mobility: Needs Assistance, +2 for physical assistance Bed Mobility: Supine to Sit Supine to sit: Max assist, +2 for physical assistance General bed mobility comments: Bed pad used for assist with scooting. Pt required cues for sequencing and safety with transitioning to full sitting position. Assist required initially to maintain sitting balance but pt able to progress to sitting up with BUE support. Total assist to return to supine.  Transfers Overall transfer level: Needs assistance Equipment used: Rolling walker (2 wheeled) Transfers: Sit to/from Stand Sit to Stand: Max assist, +2 physical assistance General transfer comment: Assist to power-up to full standing position and gain/maintain balance with RW. Pt was not able to achieve a full upright position due to pain.  Ambulation/Gait General Gait Details: Unable at this time.     ADL: ADL Overall ADL's : Needs assistance/impaired Grooming: Moderate assistance, Sitting Upper Body Bathing: Moderate assistance, Sitting Lower Body Bathing: Maximal assistance, +2 for physical  assistance, Sit to/from stand Upper Body Dressing : Moderate assistance, Sitting Upper Body Dressing Details (indicate cue type and reason): Assist for balance and donning gown. Lower Body Dressing: Maximal assistance, +2 for physical assistance, Sit to/from stand General ADL Comments: Limited eval secondary to increased HR, decreased BP, and decreased SpO2. Following sit to stand from EOB x1 pt with decreased responsiveness and returned to supine: HR=160, BP=92/54, SpO2=88% on RA.  Cognition: Cognition Overall Cognitive Status: Within Functional Limits for tasks assessed Orientation Level: Oriented X4 Cognition Arousal/Alertness: Awake/alert Behavior During Therapy: WFL for tasks assessed/performed Overall Cognitive Status: Within Functional Limits for tasks assessed  Blood pressure 97/41, pulse 117, temperature 98.9 F (37.2 C), temperature source Oral, resp. rate 16, height 5\' 5"  (1.651 m), weight 70.308 kg (155 lb), SpO2 96 %. Physical Exam  Vitals reviewed. Constitutional: She is oriented to person, place, and time. She appears well-developed and well-nourished.  HENT:  Head: Normocephalic and atraumatic.  Eyes: Conjunctivae and EOM are normal.  Neck: Normal range of motion. Neck supple. No thyromegaly present.  Cardiovascular:  Irregularly irregular  Respiratory: Effort normal and breath sounds normal. No respiratory distress.  GI: Soft. Bowel sounds are normal. She exhibits no distension.  Musculoskeletal: She exhibits edema and tenderness.  Neurological: She is alert and oriented to person, place, and time.  Sensation intact light touch Motor: Bilateral upper extremities: 4+/5 proximal to distal Right lower extremity: 4+/5  excellent to distal Left lower extremity hip flexion 2/5, ankle dorsi/plantar flexion 4+/5  Skin: Skin is warm and dry.  Hip incision clean and dry. Appropriately tender  Psychiatric: She has a normal mood and affect. Her behavior is normal.     Results for orders placed or performed during the hospital encounter of 11/10/15 (from the past 24 hour(s))  CBC     Status: Abnormal   Collection Time: 11/12/15  1:54 PM  Result Value Ref Range   WBC 8.6 4.0 - 10.5 K/uL   RBC 3.62 (L) 3.87 - 5.11 MIL/uL   Hemoglobin 10.8 (L) 12.0 - 15.0 g/dL   HCT 57.9 (L) 72.8 - 20.6 %   MCV 92.0 78.0 - 100.0 fL   MCH 29.8 26.0 - 34.0 pg   MCHC 32.4 30.0 - 36.0 g/dL   RDW 01.5 61.5 - 37.9 %   Platelets 212 150 - 400 K/uL  TSH     Status: None   Collection Time: 11/12/15  6:00 PM  Result Value Ref Range   TSH 3.320 0.350 - 4.500 uIU/mL  T4, free     Status: None   Collection Time: 11/12/15  6:00 PM  Result Value Ref Range   Free T4 1.04 0.61 - 1.12 ng/dL  Troponin I (q 6hr x 3)     Status: None   Collection Time: 11/12/15  6:00 PM  Result Value Ref Range   Troponin I 0.03 <0.031 ng/mL  Urinalysis, Routine w reflex microscopic (not at [facility])     Status: Abnormal   Collection Time: 11/12/15  7:49 PM  Result Value Ref Range   Color, Urine YELLOW YELLOW   APPearance CLEAR CLEAR   Specific Gravity, Urine 1.024 1.005 - 1.030   pH 5.5 5.0 - 8.0   Glucose, UA NEGATIVE NEGATIVE mg/dL   Hgb urine dipstick LARGE (A) NEGATIVE   Bilirubin Urine NEGATIVE NEGATIVE   Ketones, ur NEGATIVE NEGATIVE mg/dL   Protein, ur 432 (A) NEGATIVE mg/dL   Nitrite NEGATIVE NEGATIVE   Leukocytes, UA NEGATIVE NEGATIVE  Urine microscopic-add on     Status: Abnormal   Collection Time: 11/12/15  7:49 PM  Result Value Ref Range   Squamous Epithelial / LPF 0-5 (A) NONE SEEN   WBC, UA 6-30 0 - 5 WBC/hpf   RBC / HPF 6-30 0 - 5 RBC/hpf   Bacteria, UA RARE (A) NONE SEEN  Brain natriuretic peptide     Status: Abnormal   Collection Time: 11/12/15  9:03 PM  Result Value Ref Range   B Natriuretic Peptide 457.9 (H) 0.0 - 100.0 pg/mL  Troponin I (q 6hr x 3)     Status: Abnormal   Collection Time: 11/12/15 11:18 PM  Result Value Ref Range   Troponin I 0.05 (H) <0.031 ng/mL   Heparin level (unfractionated)     Status: None   Collection Time: 11/13/15  1:59 AM  Result Value Ref Range   Heparin Unfractionated 0.42 0.30 - 0.70 IU/mL  Protime-INR     Status: Abnormal   Collection Time: 11/13/15  5:06 AM  Result Value Ref Range   Prothrombin Time 19.5 (H) 11.6 - 15.2 seconds   INR 1.64 (H) 0.00 - 1.49  CBC     Status: Abnormal   Collection Time: 11/13/15  5:06 AM  Result Value Ref Range   WBC 8.9 4.0 - 10.5 K/uL   RBC 3.08 (L) 3.87 - 5.11 MIL/uL   Hemoglobin 9.0 (L) 12.0 - 15.0 g/dL  HCT 28.1 (L) 36.0 - 46.0 %   MCV 91.2 78.0 - 100.0 fL   MCH 29.2 26.0 - 34.0 pg   MCHC 32.0 30.0 - 36.0 g/dL   RDW 16.1 09.6 - 04.5 %   Platelets 178 150 - 400 K/uL  Basic metabolic panel     Status: Abnormal   Collection Time: 11/13/15  5:06 AM  Result Value Ref Range   Sodium 132 (L) 135 - 145 mmol/L   Potassium 3.8 3.5 - 5.1 mmol/L   Chloride 98 (L) 101 - 111 mmol/L   CO2 26 22 - 32 mmol/L   Glucose, Bld 113 (H) 65 - 99 mg/dL   BUN 16 6 - 20 mg/dL   Creatinine, Ser 4.09 0.44 - 1.00 mg/dL   Calcium 8.0 (L) 8.9 - 10.3 mg/dL   GFR calc non Af Amer >60 >60 mL/min   GFR calc Af Amer >60 >60 mL/min   Anion gap 8 5 - 15  Troponin I (q 6hr x 3)     Status: Abnormal   Collection Time: 11/13/15  5:06 AM  Result Value Ref Range   Troponin I 0.05 (H) <0.031 ng/mL   Dg Chest Port 1 View  11/12/2015  CLINICAL DATA:  82 year old female with tachycardia and AFib EXAM: PORTABLE CHEST 1 VIEW COMPARISON:  Chest radiograph dated 08/19/2013 and CT dated 10/07/2009 FINDINGS: Single portable view of the chest demonstrates emphysematous changes of the lungs with areas of stable scarring at the apices. There is no focal consolidation. There is mild blunting of the left costophrenic angle which may represent a small left pleural effusion. There is no pneumothorax with stable mild cardiomegaly. No acute osseous pathology identified. IMPRESSION: Emphysema with probable small left pleural  effusion. No focal consolidation or pneumothorax. Electronically Signed   By: Elgie Collard M.D.   On: 11/12/2015 18:47    Assessment/Plan: Diagnosis: Left displaced femoral neck fracture Labs and images independently reviewed.  Records reviewed and summated above. Oral pharmacological pain control  Bowel program: consider colace, miralax and/or Senna. PRN suppository Fall precautions Monitor surgical wound and skin especially over pressure sensitive areas Prevent immobility complications: pressure ulcers, contractures, HO Consider modalities, such as TENs  1. Does the need for close, 24 hr/day medical supervision in concert with the patient's rehab needs make it unreasonable for this patient to be served in a less intensive setting? Yes  2. Co-Morbidities requiring supervision/potential complications: ABLA (transfuse if necessary to ensure appropriate perfusion for increased activity tolerance), pain management (Biofeedback training with therapies to help reduce reliance on opiate pain medications, monitor pain control during therapies, and sedation at rest and titrate to maximum efficacy to ensure participation and gains in therapies),  hypertension, currently with hypotension (monitor and provide prns in accordance with increased physical exertion and pain), chronic atrial fibrillation (continue meds, monitor heart rate with increased physical activity), diastolic congestive heart failure (monitor weights), vitamin D deficiency (supplementation), hyponatremia (continue to monitor, treat if necessary) 3. Due to bladder management, safety, skin/wound care, disease management, pain management and patient education, does the patient require 24 hr/day rehab nursing? Yes 4. Does the patient require coordinated care of a physician, rehab nurse, PT (1-2 hrs/day, 5 days/week) and OT (1-2 hrs/day, 5 days/week) to address physical and functional deficits in the context of the above medical diagnosis(es)?  Yes Addressing deficits in the following areas: balance, endurance, locomotion, strength, transferring, bowel/bladder control, bathing, dressing, toileting and psychosocial support 5. Can the patient actively participate in an  intensive therapy program of at least 3 hrs of therapy per day at least 5 days per week? Yes 6. The potential for patient to make measurable gains while on inpatient rehab is excellent 7. Anticipated functional outcomes upon discharge from inpatient rehab are supervision and min assist  with PT, supervision and min assist with OT, n/a with SLP. 8. Estimated rehab length of stay to reach the above functional goals is: 10-14 days. 9. Does the patient have adequate social supports and living environment to accommodate these discharge functional goals? Yes 10. Anticipated D/C setting: Home 11. Anticipated post D/C treatments: HH therapy and Home excercise program 12. Overall Rehab/Functional Prognosis: excellent  RECOMMENDATIONS: This patient's condition is appropriate for continued rehabilitative care in the following setting: CIR Patient has agreed to participate in recommended program. Yes Note that insurance prior authorization may be required for reimbursement for recommended care.  Comment: Rehab Admissions Coordinator to follow up.  Maryla Morrow, MD 11/13/2015

## 2015-11-13 NOTE — Progress Notes (Addendum)
PROGRESS NOTE                                                                                                                                                                                                             Patient Demographics:    Kendra Hughes, is a 82 y.o. female, DOB - 11/25/1922, HYW:[id number]  Admit date - 11/10/2015   Admitting Physician Lorretta Harp, MD  Outpatient Primary MD for the patient is Carmin Richmond, MD  LOS - 3  Outpatient Specialists: Dr. Rennis Golden (cardiology)  No chief complaint on file.    Brief Narrative   82 year old female with history of hypertension, hyperlipidemia, chronic A. fib on Coumadin, diastolic CHF, vitamin D deficiency presented with fall at home with pain in her left hip. Patient reports getting out of shower and trying to get out of her towel when she fell backwards and landed on her hip. Denied loss of consciousness or sustaining any other injuries. Patient reports that she has unsteady gait and does tend to sway backwards but holds on to things.  In the ED patient's vitals were stable.  WBC of 15.6 with normal electrolytes and INR of 2.2. X-ray of the left hip showed acute subcapsular fracture of left hip with superior migration of the left hip and femur.    S/P Left hemiarthroplasty with Dr. Magnus Ivan on 11/11/2015.   Subjective:   Remains in atrial fibrillation, but pressure soft,  No chest pain, shortness of breath, or diaphoresis.  No palpitations.  Constipated, would like to get up with physical therapy    Assessment  & Plan :    Principal Problem:   Closed left hip fracture (HCC) S/P Left hemiarthroplasty with Dr. Magnus Ivan on 11/11/2015. Patient does not have any signs or symptoms of CHF, chest pain or dizziness. She is on beta blocker which is continued perioperatively. TEE from 2015 shows EF of 55-60%. Patient has moderate risk for postoperative cardiac complications. No  preoperative cardiac workup recommended. She received Vitamin K pre-operatively.  Warfarin resumed postoperatively      Persistent atrial fibrillation (HCC) Blood pressure soft, cardiology following and titrating digoxin, Cardizem, metoprolol dosing No 2-D echo since 2015, will repeat,CHADS2VASC = 5 (age, female gender, hypertension, HF).  Warfarin management per pharmacy. Cardiology had Lasix to increase blood pressure, started beta blocker for better rate control  HTN (hypertension) Relative hypotension this AM.  Hold diuresis with Lasix  Continue to monitor.  Acute blood loss anemia-hemoglobin has trended down during this admission, it falls below 8.0 will transfuse     Hyperlipidemia Continue Lipitor    Chronic diastolic (congestive) heart failure (HCC) Euvolemic. Hold  Lasix.  Caution with peri-operative fluids.  Leukocytosis Possibly reactive.   Constipation-started the patient on MiraLAX  Code Status : Full code  Family Communication  : Husband and son at bedside  Disposition Plan  : Needs skilled nursing facility upon discharge. Family requesting [facility].  No discharge before Monday.    Barriers For Discharge :  Uncontrolled A. fib  Consults  :   Orthopedics (Dr. Magnus Ivan)  Procedures  :  S/P left hemiarthroplasty 5/6  DVT Prophylaxis  :  Warfarin  Lab Results  Component Value Date   PLT 178 11/13/2015    Antibiotics  :   Cefazolin 5/6      Objective:   Filed Vitals:   11/12/15 1644 11/12/15 1959 11/12/15 2346 11/13/15 0435  BP: 98/63 106/56 121/96 97/41  Pulse: 133 112  106  Temp:  99 F (37.2 C) 99.7 F (37.6 C) 98.9 F (37.2 C)  TempSrc:  Oral Oral Oral  Resp:  12 16 16   Height:      Weight:      SpO2: 96% 98% 96% 96%    Wt Readings from Last 3 Encounters:  11/10/15 70.308 kg (155 lb)  08/21/15 70.67 kg (155 lb 12.8 oz)  02/17/15 67.813 kg (149 lb 8 oz)     Intake/Output Summary (Last 24 hours) at 11/13/15 01/13/16 Last  data filed at 11/13/15 0615  Gross per 24 hour  Intake 2358.33 ml  Output    750 ml  Net 1608.33 ml     Physical Exam  Gen: NAD, nontoxic appearing but pale HEENT: mucous membranes slightly dry, dry lips, dry tongue Pulm: CTA listening anteriorly CVS: Irregularly irregular, mildly tachycardic GI: soft, NT, ND, BS+ Musculoskeletal: limited mobility of left hip, small amount of blood on dressing.  Left thigh bruising anticipated but now indurations. CNS: Alert and oriented, nonfocal    Data Review:    CBC  Recent Labs Lab 11/10/15 0441 11/11/15 0540 11/12/15 0314 11/12/15 1354 11/13/15 0506  WBC 12.4* 7.7 8.7 8.6 8.9  HGB 12.6 12.4 11.4* 10.8* 9.0*  HCT 38.7 37.8 35.4* 33.3* 28.1*  PLT 212 209 230 212 178  MCV 91.9 91.7 91.7 92.0 91.2  MCH 29.9 30.1 29.5 29.8 29.2  MCHC 32.6 32.8 32.2 32.4 32.0  RDW 15.9* 15.8* 15.7* 15.5 15.5    Chemistries   Recent Labs Lab 11/10/15 0441 11/12/15 0314 11/13/15 0506  NA 136 133* 132*  K 4.5 4.2 3.8  CL 99* 95* 98*  CO2 27 28 26   GLUCOSE 144* 126* 113*  BUN 17 25* 16  CREATININE 0.81 1.00 0.73  CALCIUM 9.2 8.8* 8.0*   Coagulation profile  Recent Labs Lab 11/10/15 0441 11/10/15 1415 11/11/15 0540 11/12/15 0314 11/13/15 0506  INR 2.13* 2.14* 1.53* 1.38 1.64*  ------------------------------------------------------------------------------------------------------------------    Component Value Date/Time   BNP 457.9* 11/12/2015 2103    Inpatient Medications  Scheduled Meds: . antiseptic oral rinse  7 mL Mouth Rinse BID  . atorvastatin  10 mg Oral q1800  . digoxin  0.125 mg Oral Daily  . diltiazem  120 mg Oral Daily  . fluticasone  1 spray Each Nare Daily  . folic acid  1 mg Oral Daily  . loratadine  10 mg Oral Daily  . metoprolol tartrate  12.5 mg Oral Q6H  . Vitamin D (Ergocalciferol)  50,000 Units Oral Weekly  . Warfarin - Pharmacist Dosing Inpatient   Does not apply q1800   Continuous Infusions: .  sodium chloride 50 mL/hr at 11/11/15 1245  . heparin 1,000 Units/hr (11/12/15 1855)   PRN Meds:.acetaminophen **OR** acetaminophen, HYDROcodone-acetaminophen, menthol-cetylpyridinium **OR** phenol, methocarbamol **OR** methocarbamol (ROBAXIN)  IV, metoCLOPramide **OR** metoCLOPramide (REGLAN) injection, ondansetron **OR** ondansetron (ZOFRAN) IV, oxyCODONE-acetaminophen, senna-docusate  Micro Results Recent Results (from the past 240 hour(s))  Surgical pcr screen     Status: None   Collection Time: 11/10/15  6:06 PM  Result Value Ref Range Status   MRSA, PCR NEGATIVE NEGATIVE Final   Staphylococcus aureus NEGATIVE NEGATIVE Final    Comment:        The Xpert SA Assay (FDA approved for NASAL specimens in patients over 74 years of age), is one component of a comprehensive surveillance program.  Test performance has been validated by [facility] for patients greater than or equal to 56 year old. It is not intended to diagnose infection nor to guide or monitor treatment.     Radiology Reports Dg Chest Port 1 View  11/12/2015  CLINICAL DATA:  82 year old female with tachycardia and AFib EXAM: PORTABLE CHEST 1 VIEW COMPARISON:  Chest radiograph dated 08/19/2013 and CT dated 10/07/2009 FINDINGS: Single portable view of the chest demonstrates emphysematous changes of the lungs with areas of stable scarring at the apices. There is no focal consolidation. There is mild blunting of the left costophrenic angle which may represent a small left pleural effusion. There is no pneumothorax with stable mild cardiomegaly. No acute osseous pathology identified. IMPRESSION: Emphysema with probable small left pleural effusion. No focal consolidation or pneumothorax. Electronically Signed   By: Elgie Collard M.D.   On: 11/12/2015 18:47   Dg Hip Port Unilat With Pelvis 1v Left  11/10/2015  CLINICAL DATA:  Closed left hip fracture, initial visit. EXAM: DG HIP (WITH OR WITHOUT PELVIS) 1V PORT LEFT COMPARISON:   Left hip series of Nov 09, 2015 at 2200 hours. FINDINGS: The bony pelvis is diffusely osteopenic. No acute pelvic fracture is observed. There is an acute transverse subcapital fracture of the left hip with varus angulation. There is persistent superior migration of the femoral neck and a remainder of the femur superiorly with respect to the femoral head and acetabulum. There is narrowing of the left hip joint space similar to that seen on the right. IMPRESSION: Acute subcapsular fracture of the left hip with superior migration of the left hip and femur. Electronically Signed   By: David  Swaziland M.D.   On: 11/10/2015 08:29   Dg Hip Operative Unilat W Or W/o Pelvis Left  11/11/2015  CLINICAL DATA:  Femoral neck fracture. Initial encounter. EXAM: OPERATIVE left HIP (WITH PELVIS IF PERFORMED) AP VIEWS TECHNIQUE: Fluoroscopic spot image(s) were submitted for interpretation post-operatively. COMPARISON:  Radiography from yesterday FINDINGS: Bipolar left hip hemiarthroplasty without evidence of periprosthetic fracture or dislocation. IMPRESSION: Fluoroscopy for left hip hemiarthroplasty.  No adverse finding. Electronically Signed   By: Marnee Spring M.D.   On: 11/11/2015 09:27    Time Spent in minutes  25   Jerah Esty M.D on 11/13/2015 at 8:22 AM  Pager-[phone]

## 2015-11-13 NOTE — Progress Notes (Signed)
Patient ID: Kendra Hughes, female   DOB: 08-Mar-1923, 82 y.o.   MRN: [id number] Very slow going in terms of mobility.  Left hip dressing changed.  Incision stable.  Working some with therapy. Will need short-term skilled nursing.

## 2015-11-13 NOTE — Care Management (Signed)
Case manager faxed demographics, H&P, notes and all pertinent information to the case management dept at [facility] in [place]. Fax#[phone]. They review request for acute rehab beds first thing in the mornings, this patient will be reviewed on 11/14/15 and they will respond to case manager.

## 2015-11-14 ENCOUNTER — Other Ambulatory Visit (HOSPITAL_COMMUNITY): Payer: Medicare Other

## 2015-11-14 DIAGNOSIS — I4891 Unspecified atrial fibrillation: Secondary | ICD-10-CM | POA: Insufficient documentation

## 2015-11-14 LAB — COMPREHENSIVE METABOLIC PANEL
ALBUMIN: 2.1 g/dL — AB (ref 3.5–5.0)
ALK PHOS: 58 U/L (ref 38–126)
ALT: 13 U/L — ABNORMAL LOW (ref 14–54)
ANION GAP: 9 (ref 5–15)
AST: 23 U/L (ref 15–41)
BILIRUBIN TOTAL: 0.9 mg/dL (ref 0.3–1.2)
BUN: 14 mg/dL (ref 6–20)
CO2: 29 mmol/L (ref 22–32)
CREATININE: 0.69 mg/dL (ref 0.44–1.00)
Calcium: 8.1 mg/dL — ABNORMAL LOW (ref 8.9–10.3)
Chloride: 94 mmol/L — ABNORMAL LOW (ref 101–111)
GFR calc Af Amer: >60 mL/min (ref >60)
GFR calc non Af Amer: >60 mL/min (ref >60)
Glucose, Bld: 102 mg/dL — ABNORMAL HIGH (ref 65–99)
Potassium: 3.8 mmol/L (ref 3.5–5.1)
Sodium: 132 mmol/L — ABNORMAL LOW (ref 135–145)
Total Protein: 5.5 g/dL — ABNORMAL LOW (ref 6.5–8.1)

## 2015-11-14 LAB — CBC
HEMATOCRIT: 26.9 % — AB (ref 36.0–46.0)
HEMOGLOBIN: 8.7 g/dL — AB (ref 12.0–15.0)
MCH: 29.5 pg (ref 26.0–34.0)
MCHC: 32.3 g/dL (ref 30.0–36.0)
MCV: 91.2 fL (ref 78.0–100.0)
Platelets: 190 10*3/uL (ref 150–400)
RBC: 2.95 MIL/uL — ABNORMAL LOW (ref 3.87–5.11)
RDW: 15.4 % (ref 11.5–15.5)
WBC: 8.5 10*3/uL (ref 4.0–10.5)

## 2015-11-14 LAB — PROTIME-INR
INR: 1.94 — AB (ref 0.00–1.49)
Prothrombin Time: 22.1 seconds — ABNORMAL HIGH (ref 11.6–15.2)

## 2015-11-14 LAB — HEPARIN LEVEL (UNFRACTIONATED): Heparin Unfractionated: 0.53 IU/mL (ref 0.30–0.70)

## 2015-11-14 MED ORDER — BISACODYL 10 MG RE SUPP
10.0000 mg | Freq: Every day | RECTAL | Status: DC | PRN
  Administered 2015-11-15: 10 mg via RECTAL

## 2015-11-14 MED ORDER — AMIODARONE HCL IN DEXTROSE 360-4.14 MG/200ML-% IV SOLN
30.0000 mg/h | INTRAVENOUS | Status: DC
  Administered 2015-11-14: 60 mg/h via INTRAVENOUS
  Administered 2015-11-15 – 2015-11-16 (×3): 30 mg/h via INTRAVENOUS
  Filled 2015-11-14 (×5): qty 200

## 2015-11-14 MED ORDER — FUROSEMIDE 40 MG PO TABS
40.0000 mg | ORAL_TABLET | Freq: Every day | ORAL | Status: DC
  Administered 2015-11-14 – 2015-11-16 (×3): 40 mg via ORAL
  Filled 2015-11-14 (×3): qty 1

## 2015-11-14 MED ORDER — WARFARIN SODIUM 5 MG PO TABS
5.0000 mg | ORAL_TABLET | Freq: Once | ORAL | Status: AC
  Administered 2015-11-14: 5 mg via ORAL
  Filled 2015-11-14: qty 1

## 2015-11-14 MED ORDER — WARFARIN SODIUM 5 MG PO TABS: 5.0000 mg | ORAL_TABLET | Freq: Once | ORAL | Status: DC

## 2015-11-14 MED ORDER — AMIODARONE HCL IN DEXTROSE 360-4.14 MG/200ML-% IV SOLN
60.0000 mg/h | INTRAVENOUS | Status: AC
  Administered 2015-11-14: 60 mg/h via INTRAVENOUS
  Filled 2015-11-14: qty 200

## 2015-11-14 MED ORDER — BISACODYL 10 MG RE SUPP
10.0000 mg | Freq: Once | RECTAL | Status: DC
  Filled 2015-11-14: qty 1

## 2015-11-14 NOTE — Progress Notes (Signed)
Rehab admissions - Noted transfer to 2W09 and now with atrial fib and need for amio drip.  I will follow progress for now.  Call me for questions.  #[phone]

## 2015-11-14 NOTE — Clinical Social Work Note (Signed)
LCSW provided 2W LCSW with handoff on patient's discharge disposition planning. This LCSW signing off.  Marcelline Deist, LCSW [phone] Orthopedics: [phone] Surgical: [phone]

## 2015-11-14 NOTE — Clinical Social Work Placement (Signed)
   CLINICAL SOCIAL WORK PLACEMENT  NOTE  Date:  11/14/2015  Patient Details  Name: Kendra Hughes MRN: [id number] Date of Birth: May 12, 1923  Clinical Social Work is seeking post-discharge placement for this patient at the Skilled  Nursing Facility level of care (*CSW will initial, date and re-position this form in  chart as items are completed):  Yes   Patient/family provided with [facility] Clinical Social Work Department's list of facilities offering this level of care within the geographic area requested by the patient (or if unable, by the patient's family).  Yes   Patient/family informed of their freedom to choose among providers that offer the needed level of care, that participate in Medicare, Medicaid or managed care program needed by the patient, have an available bed and are willing to accept the patient.  Yes   Patient/family informed of [facility]'s ownership interest in [facility] and [facility], as well as of the fact that they are under no obligation to receive care at these facilities.  PASRR submitted to EDS on       PASRR number received on       Existing PASRR number confirmed on 11/14/15     FL2 transmitted to all facilities in geographic area requested by pt/family on 11/14/15     FL2 transmitted to all facilities within larger geographic area on 11/14/15     Patient informed that his/her managed care company has contracts with or will negotiate with certain facilities, including the following:            Patient/family informed of bed offers received.  Patient chooses bed at       Physician recommends and patient chooses bed at      Patient to be transferred to   on  .  Patient to be transferred to facility by       Patient family notified on   of transfer.  Name of family member notified:        PHYSICIAN       Additional Comment:    _______________________________________________ Rod Mae, LCSW 11/14/2015, 1:51 PM

## 2015-11-14 NOTE — Progress Notes (Signed)
Physical Therapy Treatment Patient Details Name: Kendra Hughes MRN: [id number] DOB: 1923/04/20 Today's Date: 12-10-15    History of Present Illness Pt is a 82 y/o female who sustained a displaced L femoral neck fracture after a mechanical fall on 11/09/15. Pt is now s/p L hip hemiarthroplasty and has no precautions.     PT Comments    Pt limited to supine therapeutic exercise per nursing deferral for OOB activity.  Will f/u to progress mobility after HR decreases.  101-112 bpm during exercise.    Follow Up Recommendations  CIR;Supervision/Assistance - 24 hour     Equipment Recommendations  Rolling walker with 5" wheels    Recommendations for Other Services Rehab consult     Precautions / Restrictions Precautions Precautions: Fall Restrictions Weight Bearing Restrictions: Yes LLE Weight Bearing: Weight bearing as tolerated    Mobility  Bed Mobility Overal bed mobility:  (nursing deferred mobility)                Transfers Overall transfer level:  (nursing deferred mobility)                  Ambulation/Gait Ambulation/Gait assistance:  (nursing deferred mobility)               Stairs            Wheelchair Mobility    Modified Rankin (Stroke Patients Only)       Balance                                    Cognition Arousal/Alertness: Awake/alert Behavior During Therapy: WFL for tasks assessed/performed Overall Cognitive Status: Within Functional Limits for tasks assessed                      Exercises Total Joint Exercises Ankle Circles/Pumps: AROM;Supine;10 reps;Both Quad Sets: Both;10 reps;Supine;AROM Gluteal Sets: AROM;Both;10 reps;Supine Towel Squeeze: AROM;Both;10 reps;Supine (in hooklying) Short Arc Quad: AROM;Both;10 reps;Supine Heel Slides: AROM;Both;10 reps;AAROM (AAROM for L LE.) Hip ABduction/ADduction: AROM;AAROM;Both;10 reps;Supine (AAROM for LLE.  ) Straight Leg Raises: AROM;AAROM;10  reps;Both;Supine (AAROM for LLE.  )    General Comments        Pertinent Vitals/Pain Pain Assessment: Faces Faces Pain Scale: Hurts a little bit Pain Descriptors / Indicators: Discomfort Pain Intervention(s): Monitored during session;Repositioned    Home Living                      Prior Function            PT Goals (current goals can now be found in the care plan section) Acute Rehab PT Goals Patient Stated Goal: Get better, return to independence/cooking for her family Potential to Achieve Goals: Good Progress towards PT goals: Progressing toward goals    Frequency  Min 5X/week    PT Plan Current plan remains appropriate    Co-evaluation             End of Session   Activity Tolerance: Patient tolerated treatment well Patient left: in bed;with call bell/phone within reach;with family/visitor present     Time: 9381-8299 PT Time Calculation (min) (ACUTE ONLY): 22 min  Charges:  $Therapeutic Exercise: 8-22 mins                    G Codes:      Florestine Avers 12/10/2015, 5:45 PM Tamarius Rosenfield Aundria Rud, PTA pager  [phone]   

## 2015-11-14 NOTE — Progress Notes (Signed)
PROGRESS NOTE                                                                                                                                                                                                             Patient Demographics:    Kendra Hughes, is a 82 y.o. female, DOB - December 07, 1922, MIW:[id number]  Admit date - 11/10/2015   Admitting Physician Lorretta Harp, MD  Outpatient Primary MD for the patient is Carmin Richmond, MD  LOS - 4  Outpatient Specialists: Dr. Rennis Golden (cardiology)     Brief Narrative   82 year old female with history of hypertension, hyperlipidemia, chronic A. fib on Coumadin, diastolic CHF, vitamin D deficiency presented with fall at home with pain in her left hip. Patient reports getting out of shower and trying to get out of her towel when she fell backwards and landed on her hip. Denied loss of consciousness or sustaining any other injuries. Patient reports that she has unsteady gait and does tend to sway backwards but holds on to things.  In the ED patient's vitals were stable.  WBC of 15.6 with normal electrolytes and INR of 2.2. X-ray of the left hip showed acute subcapsular fracture of left hip with superior migration of the left hip and femur.    S/P Left hemiarthroplasty with Dr. Magnus Ivan on 11/11/2015.   Subjective:   Heart rate still uncontrolled, still constipated    Assessment  & Plan :    Principal Problem:   Closed left hip fracture (HCC) S/P Left hemiarthroplasty with Dr. Magnus Ivan on 11/11/2015. Patient does not have any signs or symptoms of CHFPostoperatively,  . Continue beta blocker, TEE from 2015 shows EF of 55-60%. Patient has moderate risk for postoperative cardiac complications. No preoperative cardiac workup was recommended. She received Vitamin K pre-operatively.  Warfarin resumed postoperatively      Persistent atrial fibrillation (HCC) Blood pressure soft, cardiology  following and titrating digoxin, Cardizem, metoprolol dosing No 2-D echo since 2015, will repeat,CHADS2VASC = 5 (age, female gender, hypertension, HF).  Warfarin management per pharmacy. Cardiology discontinued Lasix to increase blood pressure,    started on IV amiodarone today given continued rapid rate. Possible transition to PO in the AM . She might need a TEE/DCCV before discharge as per cardiology. Continue heparin until INR is greater than 2.     HTN (  hypertension) Blood pressure soft, currently on Cardizem and metoprolol  Acute blood loss anemia-hemoglobin has trended down during this admission, it falls below 8.0 will transfuse     Hyperlipidemia Continue Lipitor    Chronic diastolic (congestive) heart failure (HCC) Euvolemic. 2-D echo pending  Leukocytosis Possibly reactive.   Constipation-started the patient on MiraLAX, will add Dulcolax suppository  Code Status : Full code  Family Communication  : Husband and son at bedside  Disposition Plan  : SNF versus CIR. Family requesting [facility].      Barriers For Discharge :  Uncontrolled A. fib  Consults  :   Orthopedics (Dr. Magnus Ivan)  Procedures  :  S/P left hemiarthroplasty 5/6  DVT Prophylaxis  :  Warfarin  Lab Results  Component Value Date   PLT 190 11/14/2015    Antibiotics  :   Cefazolin 5/6      Objective:   Filed Vitals:   11/13/15 1340 11/13/15 1955 11/14/15 0146 11/14/15 0443  BP:  93/51 93/42 91/36   Pulse: 106 83 75 94  Temp:  98.2 F (36.8 C)  97.8 F (36.6 C)  TempSrc:  Oral  Oral  Resp:  17  16  Height:      Weight:      SpO2:  100%  99%    Wt Readings from Last 3 Encounters:  11/10/15 70.308 kg (155 lb)  08/21/15 70.67 kg (155 lb 12.8 oz)  02/17/15 67.813 kg (149 lb 8 oz)     Intake/Output Summary (Last 24 hours) at 11/14/15 0844 Last data filed at 11/14/15 0655  Gross per 24 hour  Intake  427.5 ml  Output   1300 ml  Net -872.5 ml     Physical  Exam  Gen: NAD, nontoxic appearing but pale HEENT: mucous membranes slightly dry, dry lips, dry tongue Pulm: CTA listening anteriorly CVS: Irregularly irregular, mildly tachycardic GI: soft, NT, ND, BS+ Musculoskeletal: limited mobility of left hip, small amount of blood on dressing.  Left thigh bruising anticipated but now indurations. CNS: Alert and oriented, nonfocal    Data Review:    CBC  Recent Labs Lab 11/11/15 0540 11/12/15 0314 11/12/15 1354 11/13/15 0506 11/14/15 0540  WBC 7.7 8.7 8.6 8.9 8.5  HGB 12.4 11.4* 10.8* 9.0* 8.7*  HCT 37.8 35.4* 33.3* 28.1* 26.9*  PLT 209 230 212 178 190  MCV 91.7 91.7 92.0 91.2 91.2  MCH 30.1 29.5 29.8 29.2 29.5  MCHC 32.8 32.2 32.4 32.0 32.3  RDW 15.8* 15.7* 15.5 15.5 15.4    Chemistries   Recent Labs Lab 11/10/15 0441 11/12/15 0314 11/13/15 0506 11/14/15 0540  NA 136 133* 132* 132*  K 4.5 4.2 3.8 3.8  CL 99* 95* 98* 94*  CO2 27 28 26 29   GLUCOSE 144* 126* 113* 102*  BUN 17 25* 16 14  CREATININE 0.81 1.00 0.73 0.69  CALCIUM 9.2 8.8* 8.0* 8.1*  AST  --   --   --  23  ALT  --   --   --  13*  ALKPHOS  --   --   --  58  BILITOT  --   --   --  0.9   Coagulation profile  Recent Labs Lab 11/10/15 1415 11/11/15 0540 11/12/15 0314 11/13/15 0506 11/14/15 0540  INR 2.14* 1.53* 1.38 1.64* 1.94*  ------------------------------------------------------------------------------------------------------------------    Component Value Date/Time   BNP 457.9* 11/12/2015 2103    Inpatient Medications  Scheduled Meds: . antiseptic oral rinse  7  mL Mouth Rinse BID  . atorvastatin  10 mg Oral q1800  . digoxin  0.125 mg Oral Daily  . diltiazem  120 mg Oral Daily  . fluticasone  1 spray Each Nare Daily  . folic acid  1 mg Oral Daily  . loratadine  10 mg Oral Daily  . metoprolol tartrate  12.5 mg Oral Q6H  . polyethylene glycol  17 g Oral BID  . Vitamin D (Ergocalciferol)  50,000 Units Oral Weekly  . Warfarin - Pharmacist  Dosing Inpatient   Does not apply q1800   Continuous Infusions: . sodium chloride 20 mL/hr at 11/14/15 0145  . heparin 1,000 Units/hr (11/13/15 1751)   PRN Meds:.acetaminophen **OR** acetaminophen, HYDROcodone-acetaminophen, menthol-cetylpyridinium **OR** phenol, methocarbamol **OR** methocarbamol (ROBAXIN)  IV, metoCLOPramide **OR** metoCLOPramide (REGLAN) injection, ondansetron **OR** ondansetron (ZOFRAN) IV, oxyCODONE-acetaminophen, senna-docusate  Micro Results Recent Results (from the past 240 hour(s))  Surgical pcr screen     Status: None   Collection Time: 11/10/15  6:06 PM  Result Value Ref Range Status   MRSA, PCR NEGATIVE NEGATIVE Final   Staphylococcus aureus NEGATIVE NEGATIVE Final    Comment:        The Xpert SA Assay (FDA approved for NASAL specimens in patients over 31 years of age), is one component of a comprehensive surveillance program.  Test performance has been validated by [facility] for patients greater than or equal to 89 year old. It is not intended to diagnose infection nor to guide or monitor treatment.     Radiology Reports Dg Chest Port 1 View  11/12/2015  CLINICAL DATA:  82 year old female with tachycardia and AFib EXAM: PORTABLE CHEST 1 VIEW COMPARISON:  Chest radiograph dated 08/19/2013 and CT dated 10/07/2009 FINDINGS: Single portable view of the chest demonstrates emphysematous changes of the lungs with areas of stable scarring at the apices. There is no focal consolidation. There is mild blunting of the left costophrenic angle which may represent a small left pleural effusion. There is no pneumothorax with stable mild cardiomegaly. No acute osseous pathology identified. IMPRESSION: Emphysema with probable small left pleural effusion. No focal consolidation or pneumothorax. Electronically Signed   By: Elgie Collard M.D.   On: 11/12/2015 18:47   Dg Hip Port Unilat With Pelvis 1v Left  11/10/2015  CLINICAL DATA:  Closed left hip fracture, initial  visit. EXAM: DG HIP (WITH OR WITHOUT PELVIS) 1V PORT LEFT COMPARISON:  Left hip series of Nov 09, 2015 at 2200 hours. FINDINGS: The bony pelvis is diffusely osteopenic. No acute pelvic fracture is observed. There is an acute transverse subcapital fracture of the left hip with varus angulation. There is persistent superior migration of the femoral neck and a remainder of the femur superiorly with respect to the femoral head and acetabulum. There is narrowing of the left hip joint space similar to that seen on the right. IMPRESSION: Acute subcapsular fracture of the left hip with superior migration of the left hip and femur. Electronically Signed   By: David  Swaziland M.D.   On: 11/10/2015 08:29   Dg Hip Operative Unilat W Or W/o Pelvis Left  11/11/2015  CLINICAL DATA:  Femoral neck fracture. Initial encounter. EXAM: OPERATIVE left HIP (WITH PELVIS IF PERFORMED) AP VIEWS TECHNIQUE: Fluoroscopic spot image(s) were submitted for interpretation post-operatively. COMPARISON:  Radiography from yesterday FINDINGS: Bipolar left hip hemiarthroplasty without evidence of periprosthetic fracture or dislocation. IMPRESSION: Fluoroscopy for left hip hemiarthroplasty.  No adverse finding. Electronically Signed   By: Kathrynn Ducking.D.  On: 11/11/2015 09:27    Time Spent in minutes  25   Michaelah Credeur M.D on 11/14/2015 at 8:44 AM  Pager-[phone]

## 2015-11-14 NOTE — Progress Notes (Signed)
Per Cardiology pt will need to be on continous amio gtt.  Notified Primary MD regarding need to transfer this pt to tele unit for monitoring while on this medication.

## 2015-11-14 NOTE — Progress Notes (Signed)
   11/14/15 1209  PT Visit Information  Last PT Received On 11/14/15  Assistance Needed +2  OT goals addressed during session Other (comment) (Nurse deferral at this time will continue efforts.  )  History of Present Illness Pt is a 82 y/o female who sustained a displaced L femoral neck fracture after a mechanical fall on 11/09/15. Pt is now s/p L hip hemiarthroplasty and has no precautions.   Joycelyn Rua, PTA pager [phone]

## 2015-11-14 NOTE — Progress Notes (Signed)
ANTICOAGULATION CONSULT NOTE  Pharmacy Consult for Heparin / Coumadin Indication: atrial fibrillation, VTE ppx s/p hip surgery on 5/6  No Known Allergies  Patient Measurements: Height: 5\' 5"  (165.1 cm) Weight: 155 lb (70.308 kg) IBW/kg (Calculated) : 57 HDWt: 70.kg  Vital Signs: Temp: 97.8 F (36.6 C) (05/09 0443) Temp Source: Oral (05/09 0443) BP: 91/36 mmHg (05/09 0443) Pulse Rate: 94 (05/09 0443)  Labs:  Recent Labs  11/12/15 0314 11/12/15 1354 11/12/15 1800 11/12/15 2318 11/13/15 0159 11/13/15 0506 11/14/15 0540  HGB 11.4* 10.8*  --   --   --  9.0* 8.7*  HCT 35.4* 33.3*  --   --   --  28.1* 26.9*  PLT 230 212  --   --   --  178 190  LABPROT 17.1*  --   --   --   --  19.5* 22.1*  INR 1.38  --   --   --   --  1.64* 1.94*  HEPARINUNFRC  --   --   --   --  0.42  --  0.53  CREATININE 1.00  --   --   --   --  0.73 0.69  TROPONINI  --   --  0.03 0.05*  --  0.05*  --     Estimated Creatinine Clearance: 46 mL/min (by C-G formula based on Cr of 0.69).   Assessment: 89 yof on warfarin pta for afib, admitted with hip fracture s/p fall. INR 2.13 on admit, reversed with vit k prior to OR. Pharmacy consulted to restart warfarin 5/6 for afib, VTE ppx s/p hip surgery on 5/6. High fall risk per MD note.   PTA warfarin dose: 3mg  daily except 4mg  on Mon  Heparin level therapeutic INR = 1.94, trending up  Goal of Therapy:  INR 2-3 Heparin level 0.3-0.7 units/ml Monitor platelets by anticoagulation protocol: Yes   Plan:  1. Continue heparin at 1000 units/h 2. Repeat Coumadin 5 mg po x 1 today then resume home dose 5/10 3. Daily HL/CBC/INR 4. Monitor s/sx bleeding  Thank you , PharmD [phone]  11/14/2015, 8:54 AM

## 2015-11-14 NOTE — Progress Notes (Signed)
Utilization review completed.  

## 2015-11-14 NOTE — Progress Notes (Signed)
SUBJECTIVE:  No SOB.  Only mild pain.   She was able to ambulate a few steps with PT yesterday.    PHYSICAL EXAM Filed Vitals:   11/13/15 1340 11/13/15 1955 11/14/15 0146 11/14/15 0443  BP:  93/51 93/42 91/36   Pulse: 106 83 75 94  Temp:  98.2 F (36.8 C)  97.8 F (36.6 C)  TempSrc:  Oral  Oral  Resp:  17  16  Height:      Weight:      SpO2:  100%  99%   General:  No acute distress Lungs:  Few dependent crackles Heart:  Irregular Abdomen:  Positive bowel sounds, no rebound no guarding Extremities:  No edema  Neuro:  Nonfocal  LABS: Lab Results  Component Value Date   TROPONINI 0.05* 11/13/2015   Results for orders placed or performed during the hospital encounter of 11/10/15 (from the past 24 hour(s))  Protime-INR     Status: Abnormal   Collection Time: 11/14/15  5:40 AM  Result Value Ref Range   Prothrombin Time 22.1 (H) 11.6 - 15.2 seconds   INR 1.94 (H) 0.00 - 1.49  CBC     Status: Abnormal   Collection Time: 11/14/15  5:40 AM  Result Value Ref Range   WBC 8.5 4.0 - 10.5 K/uL   RBC 2.95 (L) 3.87 - 5.11 MIL/uL   Hemoglobin 8.7 (L) 12.0 - 15.0 g/dL   HCT 01/14/16 (L) 11.9 - 14.7 %   MCV 91.2 78.0 - 100.0 fL   MCH 29.5 26.0 - 34.0 pg   MCHC 32.3 30.0 - 36.0 g/dL   RDW 82.9 56.2 - 13.0 %   Platelets 190 150 - 400 K/uL  Comprehensive metabolic panel     Status: Abnormal   Collection Time: 11/14/15  5:40 AM  Result Value Ref Range   Sodium 132 (L) 135 - 145 mmol/L   Potassium 3.8 3.5 - 5.1 mmol/L   Chloride 94 (L) 101 - 111 mmol/L   CO2 29 22 - 32 mmol/L   Glucose, Bld 102 (H) 65 - 99 mg/dL   BUN 14 6 - 20 mg/dL   Creatinine, Ser 01/14/16 0.44 - 1.00 mg/dL   Calcium 8.1 (L) 8.9 - 10.3 mg/dL   Total Protein 5.5 (L) 6.5 - 8.1 g/dL   Albumin 2.1 (L) 3.5 - 5.0 g/dL   AST 23 15 - 41 U/L   ALT 13 (L) 14 - 54 U/L   Alkaline Phosphatase 58 38 - 126 U/L   Total Bilirubin 0.9 0.3 - 1.2 mg/dL   GFR calc non Af Amer >60 >60 mL/min   GFR calc Af Amer >60 >60 mL/min   Anion gap 9 5 - 15  Heparin level (unfractionated)     Status: None   Collection Time: 11/14/15  5:40 AM  Result Value Ref Range   Heparin Unfractionated 0.53 0.30 - 0.70 IU/mL    Intake/Output Summary (Last 24 hours) at 11/14/15 0839 Last data filed at 11/14/15 0655  Gross per 24 hour  Intake  427.5 ml  Output   1300 ml  Net -872.5 ml    ASSESSMENT AND PLAN:  ATRIAL FIB WITH RVR:   Started on low dose dig and beta blocker yesterday.  She was able to get all doses of the beta blocker and her Cardizem.    Despite this her rate is still very elevated.    I would like to have her on IV amiodarone today given continued rapid  rate.  Possible transition to PO in the AM .  She might need a TEE/DCCV before discharge.   Continue heparin until INR is greater than 2.    MILDLY ELEVATED TROPONIN:  Nonspecific.  Not a NSTEMI.   HeFPEF:    Restart Lasix.   Rollene Rotunda 11/14/2015 8:39 AM

## 2015-11-15 ENCOUNTER — Inpatient Hospital Stay (HOSPITAL_COMMUNITY): Payer: Medicare Other

## 2015-11-15 DIAGNOSIS — I4891 Unspecified atrial fibrillation: Secondary | ICD-10-CM

## 2015-11-15 LAB — CBC
HEMATOCRIT: 24.9 % — AB (ref 36.0–46.0)
Hemoglobin: 8.1 g/dL — ABNORMAL LOW (ref 12.0–15.0)
MCH: 28.9 pg (ref 26.0–34.0)
MCHC: 32.5 g/dL (ref 30.0–36.0)
MCV: 88.9 fL (ref 78.0–100.0)
Platelets: 220 10*3/uL (ref 150–400)
RBC: 2.8 MIL/uL — AB (ref 3.87–5.11)
RDW: 15.4 % (ref 11.5–15.5)
WBC: 8.4 10*3/uL (ref 4.0–10.5)

## 2015-11-15 LAB — HEPARIN LEVEL (UNFRACTIONATED): Heparin Unfractionated: 0.42 IU/mL (ref 0.30–0.70)

## 2015-11-15 LAB — ECHOCARDIOGRAM COMPLETE
Height: 65 in
Weight: 2480 oz

## 2015-11-15 LAB — PREPARE RBC (CROSSMATCH)

## 2015-11-15 LAB — PROTIME-INR
INR: 2.04 — ABNORMAL HIGH (ref 0.00–1.49)
Prothrombin Time: 22.9 seconds — ABNORMAL HIGH (ref 11.6–15.2)

## 2015-11-15 MED ORDER — WARFARIN SODIUM 3 MG PO TABS
3.0000 mg | ORAL_TABLET | ORAL | Status: DC
  Administered 2015-11-15: 3 mg via ORAL
  Filled 2015-11-15: qty 1

## 2015-11-15 MED ORDER — POTASSIUM CHLORIDE CRYS ER 20 MEQ PO TBCR
40.0000 meq | EXTENDED_RELEASE_TABLET | Freq: Once | ORAL | Status: AC
  Administered 2015-11-15: 40 meq via ORAL
  Filled 2015-11-15: qty 2

## 2015-11-15 MED ORDER — SODIUM CHLORIDE 0.9 % IV SOLN: INTRAVENOUS | Status: DC

## 2015-11-15 MED ORDER — WARFARIN SODIUM 4 MG PO TABS: 4.0000 mg | ORAL_TABLET | ORAL | Status: DC

## 2015-11-15 MED ORDER — SODIUM CHLORIDE 0.9 % IV SOLN
Freq: Once | INTRAVENOUS | Status: AC
  Administered 2015-11-15: 16:00:00 via INTRAVENOUS

## 2015-11-15 NOTE — Progress Notes (Signed)
ANTICOAGULATION CONSULT NOTE  Pharmacy Consult for Coumadin Indication: atrial fibrillation, VTE ppx s/p hip surgery on 5/6  No Known Allergies  Patient Measurements: Height: 5\' 5"  (165.1 cm) Weight: 155 lb (70.308 kg) IBW/kg (Calculated) : 57 HDWt: 70.kg  Vital Signs: Temp: 98.3 F (36.8 C) (05/10 0425) Temp Source: Oral (05/10 0425) BP: 120/66 mmHg (05/10 0920) Pulse Rate: 88 (05/10 0920)  Labs:  Recent Labs  11/12/15 1800 11/12/15 2318 11/13/15 0159 11/13/15 0506 11/14/15 0540 11/15/15 0403 11/15/15 0404  HGB  --   --   --  9.0* 8.7* 8.1*  --   HCT  --   --   --  28.1* 26.9* 24.9*  --   PLT  --   --   --  178 190 220  --   LABPROT  --   --   --  19.5* 22.1* 22.9*  --   INR  --   --   --  1.64* 1.94* 2.04*  --   HEPARINUNFRC  --   --  0.42  --  0.53  --  0.42  CREATININE  --   --   --  0.73 0.69  --   --   TROPONINI 0.03 0.05*  --  0.05*  --   --   --     Estimated Creatinine Clearance: 46 mL/min (by C-G formula based on Cr of 0.69).   Assessment: 89 yof on warfarin pta for afib, admitted with hip fracture s/p fall. INR 2.13 on admit, reversed with vit k prior to OR. Pharmacy consulted to restart warfarin 5/6 for afib, VTE ppx s/p hip surgery on 5/6. High fall risk per MD note. Heparin bridge d/c'd 5/10.  PTA warfarin dose: 3mg  daily except 4mg  on Mon  INR = 1.94>2.04. Hg 8.1 stable, plt wnl. No bleed documented.  Goal of Therapy:  INR 2-3 Monitor platelets by anticoagulation protocol: Yes   Plan:  1. Coumadin home dose resumed - 3mg  daily except 4mg  on Mon 2. Daily INR 3. Monitor s/sx bleeding   , PharmD, [facility] Clinical Pharmacist Pager [phone] 11/15/2015 11:05 AM

## 2015-11-15 NOTE — Care Management Important Message (Signed)
Important Message  Patient Details  Name: Kendra Hughes MRN: [id number] Date of Birth: 10-26-1922   Medicare Important Message Given:  Yes    [name] 11/15/2015, 1:14 PM

## 2015-11-15 NOTE — Progress Notes (Signed)
    SUBJECTIVE:  No SOB. No distress   PHYSICAL EXAM Filed Vitals:   11/14/15 2000 11/15/15 0253 11/15/15 0425 11/15/15 0920  BP: 109/55 90/45 92/43  120/66  Pulse: 92 72 77 88  Temp: 98.8 F (37.1 C)  98.3 F (36.8 C)   TempSrc: Oral  Oral   Resp: 18  17   Height:      Weight:      SpO2: 90%  97%    General:  No acute distress Lungs:  Few dependent crackles Heart:  Irregular Abdomen:  Positive bowel sounds, no rebound no guarding Extremities:  No edema  Neuro:  Nonfocal  LABS: Lab Results  Component Value Date   TROPONINI 0.05* 11/13/2015   Results for orders placed or performed during the hospital encounter of 11/10/15 (from the past 24 hour(s))  Protime-INR     Status: Abnormal   Collection Time: 11/15/15  4:03 AM  Result Value Ref Range   Prothrombin Time 22.9 (H) 11.6 - 15.2 seconds   INR 2.04 (H) 0.00 - 1.49  CBC     Status: Abnormal   Collection Time: 11/15/15  4:03 AM  Result Value Ref Range   WBC 8.4 4.0 - 10.5 K/uL   RBC 2.80 (L) 3.87 - 5.11 MIL/uL   Hemoglobin 8.1 (L) 12.0 - 15.0 g/dL   HCT 78.2 (L) 95.6 - 21.3 %   MCV 88.9 78.0 - 100.0 fL   MCH 28.9 26.0 - 34.0 pg   MCHC 32.5 30.0 - 36.0 g/dL   RDW 08.6 57.8 - 46.9 %   Platelets 220 150 - 400 K/uL  Heparin level (unfractionated)     Status: None   Collection Time: 11/15/15  4:04 AM  Result Value Ref Range   Heparin Unfractionated 0.42 0.30 - 0.70 IU/mL    Intake/Output Summary (Last 24 hours) at 11/15/15 0956 Last data filed at 11/14/15 2007  Gross per 24 hour  Intake     40 ml  Output    400 ml  Net   -360 ml    ASSESSMENT AND PLAN:  ATRIAL FIB WITH RVR:   This morning the rate was faster.  However, now improved this afternoon.  I will plan to keep NPO and if she does not have reasonable rate control in the AM (after I turn off the IV amio and change to PO) she will need a TEE/DCCV.  I will keep the IV amio on for now.     MILDLY ELEVATED TROPONIN:  Nonspecific.  Not a NSTEMI.   HeFPEF:     Restarted Lasix.   She requests a decreased dose and I will do this.   Fayrene Fearing [facility] 11/15/2015 9:56 AM

## 2015-11-15 NOTE — Progress Notes (Signed)
  Echocardiogram 2D Echocardiogram has been performed.  Kendra Hughes 11/15/2015, 10:24 AM

## 2015-11-15 NOTE — Progress Notes (Signed)
Physical Therapy Treatment Patient Details Name: Kendra Hughes MRN: [id number] DOB: [birth year] Today's Date: 11/15/2015    History of Present Illness Pt is a 82 y/o female who sustained a displaced L femoral neck fracture after a mechanical fall on 11/09/15. Pt is now s/p L hip hemiarthroplasty and has no precautions.     PT Comments    Patient progressing this session with mobility able to mobilize with less support and tolerating up on her feet taking more steps and without much pain.  Limited still due to HR up to 130's.  Feel continued skilled PT in the acute setting will assist pt to progress to home with assist following CIR stay.  Follow Up Recommendations  CIR;Supervision/Assistance - 24 hour     Equipment Recommendations  Rolling walker with 5" wheels    Recommendations for Other Services       Precautions / Restrictions Precautions Precautions: Fall Restrictions LLE Weight Bearing: Weight bearing as tolerated    Mobility  Bed Mobility   Bed Mobility: Supine to Sit     Supine to sit: Mod assist;+2 for physical assistance     General bed mobility comments: cues for technique; assist to bring legs to EOB and to lift trunk upright  Transfers Overall transfer level: Needs assistance Equipment used: Rolling walker (2 wheeled) Transfers: Sit to/from [employer] Sit to Stand: Mod assist;+2 physical assistance Stand pivot transfers: Mod assist;+2 physical assistance       General transfer comment: lifting help and cues for hand placement up from EOB and for balance; stand step to [facility], then to recliner with RW and mod A for balance, walker management and safety  Ambulation/Gait Ambulation/Gait assistance:  (deferred dut HR up to 130 with OOB to chair)               Stairs            Wheelchair Mobility    Modified Rankin (Stroke Patients Only)       Balance Overall balance assessment: Needs assistance Sitting-balance support:  Feet supported;Bilateral upper extremity supported Sitting balance-Leahy Scale: Poor Sitting balance - Comments: holding onto rail can support herself, but unable to sit without holding on   Standing balance support: Bilateral upper extremity supported Standing balance-Leahy Scale: Poor Standing balance comment: UE support for balance                    Cognition Arousal/Alertness: Awake/alert Behavior During Therapy: WFL for tasks assessed/performed Overall Cognitive Status: Within Functional Limits for tasks assessed                      Exercises Total Joint Exercises Ankle Circles/Pumps: AROM;10 reps;Both;Seated Heel Slides: AROM;AAROM;Both;Seated Long Arc Quad: AROM;Both;10 reps    General Comments        Pertinent Vitals/Pain Faces Pain Scale: Hurts a little bit Pain Location: L hip with moving to EOB Pain Descriptors / Indicators: Sore;Discomfort Pain Intervention(s): Monitored during session;Repositioned    Home Living                      Prior Function            PT Goals (current goals can now be found in the care plan section) Progress towards PT goals: Progressing toward goals    Frequency  Min 5X/week    PT Plan Current plan remains appropriate    Co-evaluation  End of Session Equipment Utilized During Treatment: Gait belt Activity Tolerance: Patient tolerated treatment well Patient left: in chair;with call bell/phone within reach;with chair alarm set     Time: 1100-1133 PT Time Calculation (min) (ACUTE ONLY): 33 min  Charges:  $Therapeutic Exercise: 8-22 mins $Therapeutic Activity: 8-22 mins                    G Codes:      Elray Mcgregor 2015-11-24, 12:32 PM  Sheran Lawless, PT [phone] Nov 24, 2015

## 2015-11-15 NOTE — Progress Notes (Signed)
PROGRESS NOTE                                                                                                                                                                                                             Patient Demographics:    Kendra Hughes, is a 82 y.o. female, DOB - Aug 09, 1922, QIH:[id number]  Admit date - 11/10/2015   Admitting Physician Lorretta Harp, MD  Outpatient Primary MD for the patient is Carmin Richmond, MD  LOS - 5  Outpatient Specialists: Dr. Rennis Golden (cardiology)     Brief Narrative   82 year old female with history of hypertension, hyperlipidemia, chronic A. fib on Coumadin, diastolic CHF, vitamin D deficiency presented with fall at home with pain in her left hip. Patient reports getting out of shower and trying to get out of her towel when she fell backwards and landed on her hip. Denied loss of consciousness or sustaining any other injuries. Patient reports that she has unsteady gait and does tend to sway backwards but holds on to things.  In the ED patient's vitals were stable.  WBC of 15.6 with normal electrolytes and INR of 2.2. X-ray of the left hip showed acute subcapsular fracture of left hip with superior migration of the left hip and femur.    S/P Left hemiarthroplasty with Dr. Magnus Ivan on 11/11/2015.   Subjective:   Patient resting comfortably in bed, denies any chest pain or shortness of breath    Assessment  & Plan :    Principal Problem:   Closed left hip fracture (HCC) S/P Left hemiarthroplasty with Dr. Magnus Ivan on 11/11/2015. Patient does not have any signs or symptoms of CHF Postoperatively,  , TEE from 2015 shows EF of 55-60%. Patient has moderate risk for postoperative cardiac complications. No preoperative cardiac workup was recommended. She received Vitamin K pre-operatively.  Warfarin resumed postoperatively      Persistent atrial fibrillation (HCC) Blood pressure soft,  cardiology following and titrating digoxin, Cardizem, metoprolol , amiodarone dosing No 2-D echo since 2015, will repeat,CHADS2VASC = 5 (age, female gender, hypertension, HF).  Warfarin management per pharmacy. Cardiology managing Lasix  started on IV amiodarone 5/9 given continued rapid rate. Possible transition to PO today . She might need a TEE/DCCV before discharge as per cardiology. Discontinue heparin if INR greater than 2     HTN (hypertension) Blood  pressure soft, currently on Cardizem and metoprolol  Acute blood loss anemia-hemoglobin has trended down during this admission 12.6 on 11/10/15, hemoglobin 8.1, will transfuse 1 unit today     Hyperlipidemia Continue Lipitor    Chronic diastolic (congestive) heart failure (HCC) Euvolemic. 2-D echo pending  Leukocytosis Possibly reactive.   Constipation-started the patient on MiraLAX, will add Dulcolax suppository  Code Status : Full code  Family Communication  : Husband and son at bedside  Disposition Plan  : SNF versus CIR. Family requesting [facility].      Barriers For Discharge :  Uncontrolled A. fib  Consults  :   Orthopedics (Dr. Magnus Ivan)  Procedures  :  S/P left hemiarthroplasty 5/6  DVT Prophylaxis  :  Warfarin  Lab Results  Component Value Date   PLT 220 11/15/2015    Antibiotics  :   Cefazolin 5/6      Objective:   Filed Vitals:   11/14/15 1631 11/14/15 2000 11/15/15 0253 11/15/15 0425  BP: 106/51 109/55 90/45 92/43   Pulse:  92 72 77  Temp:  98.8 F (37.1 C)  98.3 F (36.8 C)  TempSrc:  Oral  Oral  Resp:  18  17  Height:      Weight:      SpO2:  90%  97%    Wt Readings from Last 3 Encounters:  11/10/15 70.308 kg (155 lb)  08/21/15 70.67 kg (155 lb 12.8 oz)  02/17/15 67.813 kg (149 lb 8 oz)     Intake/Output Summary (Last 24 hours) at 11/15/15 0832 Last data filed at 11/14/15 2007  Gross per 24 hour  Intake    280 ml  Output    400 ml  Net   -120 ml     Physical  Exam  Gen: NAD, nontoxic appearing but pale HEENT: mucous membranes slightly dry, dry lips, dry tongue Pulm: CTA listening anteriorly CVS: Irregularly irregular, mildly tachycardic GI: soft, NT, ND, BS+ Musculoskeletal: limited mobility of left hip, small amount of blood on dressing.  Left thigh bruising anticipated but now indurations. CNS: Alert and oriented, nonfocal    Data Review:    CBC  Recent Labs Lab 11/12/15 0314 11/12/15 1354 11/13/15 0506 11/14/15 0540 11/15/15 0403  WBC 8.7 8.6 8.9 8.5 8.4  HGB 11.4* 10.8* 9.0* 8.7* 8.1*  HCT 35.4* 33.3* 28.1* 26.9* 24.9*  PLT 230 212 178 190 220  MCV 91.7 92.0 91.2 91.2 88.9  MCH 29.5 29.8 29.2 29.5 28.9  MCHC 32.2 32.4 32.0 32.3 32.5  RDW 15.7* 15.5 15.5 15.4 15.4    Chemistries   Recent Labs Lab 11/10/15 0441 11/12/15 0314 11/13/15 0506 11/14/15 0540  NA 136 133* 132* 132*  K 4.5 4.2 3.8 3.8  CL 99* 95* 98* 94*  CO2 27 28 26 29   GLUCOSE 144* 126* 113* 102*  BUN 17 25* 16 14  CREATININE 0.81 1.00 0.73 0.69  CALCIUM 9.2 8.8* 8.0* 8.1*  AST  --   --   --  23  ALT  --   --   --  13*  ALKPHOS  --   --   --  58  BILITOT  --   --   --  0.9   Coagulation profile  Recent Labs Lab 11/11/15 0540 11/12/15 0314 11/13/15 0506 11/14/15 0540 11/15/15 0403  INR 1.53* 1.38 1.64* 1.94* 2.04*  ------------------------------------------------------------------------------------------------------------------    Component Value Date/Time   BNP 457.9* 11/12/2015 2103    Inpatient Medications  Scheduled  Meds: . antiseptic oral rinse  7 mL Mouth Rinse BID  . atorvastatin  10 mg Oral q1800  . bisacodyl  10 mg Rectal Once  . digoxin  0.125 mg Oral Daily  . diltiazem  120 mg Oral Daily  . fluticasone  1 spray Each Nare Daily  . folic acid  1 mg Oral Daily  . furosemide  40 mg Oral Daily  . loratadine  10 mg Oral Daily  . metoprolol tartrate  12.5 mg Oral Q6H  . polyethylene glycol  17 g Oral BID  . potassium  chloride  40 mEq Oral Once  . Vitamin D (Ergocalciferol)  50,000 Units Oral Weekly  . Warfarin - Pharmacist Dosing Inpatient   Does not apply q1800   Continuous Infusions: . sodium chloride 20 mL/hr at 11/14/15 0145  . amiodarone 30 mg/hr (11/15/15 0253)   PRN Meds:.acetaminophen **OR** acetaminophen, bisacodyl, HYDROcodone-acetaminophen, menthol-cetylpyridinium **OR** phenol, methocarbamol **OR** methocarbamol (ROBAXIN)  IV, metoCLOPramide **OR** metoCLOPramide (REGLAN) injection, ondansetron **OR** ondansetron (ZOFRAN) IV, oxyCODONE-acetaminophen, senna-docusate  Micro Results Recent Results (from the past 240 hour(s))  Surgical pcr screen     Status: None   Collection Time: 11/10/15  6:06 PM  Result Value Ref Range Status   MRSA, PCR NEGATIVE NEGATIVE Final   Staphylococcus aureus NEGATIVE NEGATIVE Final    Comment:        The Xpert SA Assay (FDA approved for NASAL specimens in patients over 55 years of age), is one component of a comprehensive surveillance program.  Test performance has been validated by [facility] for patients greater than or equal to 32 year old. It is not intended to diagnose infection nor to guide or monitor treatment.     Radiology Reports Dg Chest Port 1 View  11/12/2015  CLINICAL DATA:  82 year old female with tachycardia and AFib EXAM: PORTABLE CHEST 1 VIEW COMPARISON:  Chest radiograph dated 08/19/2013 and CT dated 10/07/2009 FINDINGS: Single portable view of the chest demonstrates emphysematous changes of the lungs with areas of stable scarring at the apices. There is no focal consolidation. There is mild blunting of the left costophrenic angle which may represent a small left pleural effusion. There is no pneumothorax with stable mild cardiomegaly. No acute osseous pathology identified. IMPRESSION: Emphysema with probable small left pleural effusion. No focal consolidation or pneumothorax. Electronically Signed   By: Elgie Collard M.D.   On:  11/12/2015 18:47   Dg Hip Port Unilat With Pelvis 1v Left  11/10/2015  CLINICAL DATA:  Closed left hip fracture, initial visit. EXAM: DG HIP (WITH OR WITHOUT PELVIS) 1V PORT LEFT COMPARISON:  Left hip series of Nov 09, 2015 at 2200 hours. FINDINGS: The bony pelvis is diffusely osteopenic. No acute pelvic fracture is observed. There is an acute transverse subcapital fracture of the left hip with varus angulation. There is persistent superior migration of the femoral neck and a remainder of the femur superiorly with respect to the femoral head and acetabulum. There is narrowing of the left hip joint space similar to that seen on the right. IMPRESSION: Acute subcapsular fracture of the left hip with superior migration of the left hip and femur. Electronically Signed   By: David  [place] M.D.   On: 11/10/2015 08:29   Dg Hip Operative Unilat W Or W/o Pelvis Left  11/11/2015  CLINICAL DATA:  Femoral neck fracture. Initial encounter. EXAM: OPERATIVE left HIP (WITH PELVIS IF PERFORMED) AP VIEWS TECHNIQUE: Fluoroscopic spot image(s) were submitted for interpretation post-operatively. COMPARISON:  Radiography from  yesterday FINDINGS: Bipolar left hip hemiarthroplasty without evidence of periprosthetic fracture or dislocation. IMPRESSION: Fluoroscopy for left hip hemiarthroplasty.  No adverse finding. Electronically Signed   By: Marnee Spring M.D.   On: 11/11/2015 09:27    Time Spent in minutes  25   Gillian Kluever M.D on 11/15/2015 at 8:32 AM  [phone]

## 2015-11-16 ENCOUNTER — Other Ambulatory Visit (HOSPITAL_COMMUNITY): Payer: Medicare Other

## 2015-11-16 ENCOUNTER — Encounter (HOSPITAL_COMMUNITY)
Admission: EM | Disposition: A | Discharge status: Rehabilitation Facility | Payer: Self-pay | Source: Other Acute Inpatient Hospital | Attending: Internal Medicine

## 2015-11-16 ENCOUNTER — Inpatient Hospital Stay (HOSPITAL_COMMUNITY)
Admission: RE | Admit: 2015-11-16 | Discharge: 2015-11-25 | DRG: 560 | Disposition: A | Discharge status: Home Health | Payer: Medicare Other | Source: Intra-hospital | Attending: Physical Medicine & Rehabilitation | Admitting: Physical Medicine & Rehabilitation

## 2015-11-16 DIAGNOSIS — Z8249 Family history of ischemic heart disease and other diseases of the circulatory system: Secondary | ICD-10-CM

## 2015-11-16 DIAGNOSIS — S72001F Fracture of unspecified part of neck of right femur, subsequent encounter for open fracture type IIIA, IIIB, or IIIC with routine healing: Secondary | ICD-10-CM | POA: Diagnosis not present

## 2015-11-16 DIAGNOSIS — Z79899 Other long term (current) drug therapy: Secondary | ICD-10-CM | POA: Diagnosis not present

## 2015-11-16 DIAGNOSIS — K5900 Constipation, unspecified: Secondary | ICD-10-CM | POA: Diagnosis not present

## 2015-11-16 DIAGNOSIS — R339 Retention of urine, unspecified: Secondary | ICD-10-CM | POA: Diagnosis not present

## 2015-11-16 DIAGNOSIS — R5381 Other malaise: Secondary | ICD-10-CM

## 2015-11-16 DIAGNOSIS — Z7901 Long term (current) use of anticoagulants: Secondary | ICD-10-CM

## 2015-11-16 DIAGNOSIS — Z86718 Personal history of other venous thrombosis and embolism: Secondary | ICD-10-CM | POA: Diagnosis not present

## 2015-11-16 DIAGNOSIS — L03113 Cellulitis of right upper limb: Secondary | ICD-10-CM | POA: Diagnosis not present

## 2015-11-16 DIAGNOSIS — S72002S Fracture of unspecified part of neck of left femur, sequela: Secondary | ICD-10-CM

## 2015-11-16 DIAGNOSIS — I4891 Unspecified atrial fibrillation: Secondary | ICD-10-CM | POA: Diagnosis not present

## 2015-11-16 DIAGNOSIS — D62 Acute posthemorrhagic anemia: Secondary | ICD-10-CM | POA: Diagnosis not present

## 2015-11-16 DIAGNOSIS — E8809 Other disorders of plasma-protein metabolism, not elsewhere classified: Secondary | ICD-10-CM | POA: Diagnosis not present

## 2015-11-16 DIAGNOSIS — S72009A Fracture of unspecified part of neck of unspecified femur, initial encounter for closed fracture: Secondary | ICD-10-CM | POA: Diagnosis present

## 2015-11-16 DIAGNOSIS — G4700 Insomnia, unspecified: Secondary | ICD-10-CM | POA: Insufficient documentation

## 2015-11-16 DIAGNOSIS — G251 Drug-induced tremor: Secondary | ICD-10-CM

## 2015-11-16 DIAGNOSIS — Z471 Aftercare following joint replacement surgery: Principal | ICD-10-CM

## 2015-11-16 DIAGNOSIS — Z96652 Presence of left artificial knee joint: Secondary | ICD-10-CM | POA: Diagnosis not present

## 2015-11-16 DIAGNOSIS — T462X5A Adverse effect of other antidysrhythmic drugs, initial encounter: Secondary | ICD-10-CM | POA: Diagnosis not present

## 2015-11-16 DIAGNOSIS — M1711 Unilateral primary osteoarthritis, right knee: Secondary | ICD-10-CM | POA: Diagnosis not present

## 2015-11-16 DIAGNOSIS — I5033 Acute on chronic diastolic (congestive) heart failure: Secondary | ICD-10-CM | POA: Diagnosis not present

## 2015-11-16 DIAGNOSIS — E46 Unspecified protein-calorie malnutrition: Secondary | ICD-10-CM | POA: Insufficient documentation

## 2015-11-16 DIAGNOSIS — Z96649 Presence of unspecified artificial hip joint: Secondary | ICD-10-CM | POA: Insufficient documentation

## 2015-11-16 DIAGNOSIS — Z96642 Presence of left artificial hip joint: Secondary | ICD-10-CM

## 2015-11-16 DIAGNOSIS — N390 Urinary tract infection, site not specified: Secondary | ICD-10-CM | POA: Insufficient documentation

## 2015-11-16 DIAGNOSIS — I5032 Chronic diastolic (congestive) heart failure: Secondary | ICD-10-CM

## 2015-11-16 DIAGNOSIS — B9689 Other specified bacterial agents as the cause of diseases classified elsewhere: Secondary | ICD-10-CM

## 2015-11-16 DIAGNOSIS — I481 Persistent atrial fibrillation: Secondary | ICD-10-CM

## 2015-11-16 DIAGNOSIS — R32 Unspecified urinary incontinence: Secondary | ICD-10-CM | POA: Insufficient documentation

## 2015-11-16 DIAGNOSIS — Z9660 Presence of unspecified orthopedic joint implant: Secondary | ICD-10-CM | POA: Diagnosis not present

## 2015-11-16 DIAGNOSIS — I110 Hypertensive heart disease with heart failure: Secondary | ICD-10-CM | POA: Diagnosis not present

## 2015-11-16 DIAGNOSIS — E785 Hyperlipidemia, unspecified: Secondary | ICD-10-CM

## 2015-11-16 DIAGNOSIS — R251 Tremor, unspecified: Secondary | ICD-10-CM | POA: Insufficient documentation

## 2015-11-16 DIAGNOSIS — R778 Other specified abnormalities of plasma proteins: Secondary | ICD-10-CM | POA: Diagnosis not present

## 2015-11-16 DIAGNOSIS — S72002A Fracture of unspecified part of neck of left femur, initial encounter for closed fracture: Secondary | ICD-10-CM

## 2015-11-16 LAB — TYPE AND SCREEN
ABO/RH(D): O POS
Antibody Screen: NEGATIVE
Unit division: 0

## 2015-11-16 LAB — CBC
HCT: 29.6 % — ABNORMAL LOW (ref 36.0–46.0)
HEMOGLOBIN: 9.7 g/dL — AB (ref 12.0–15.0)
MCH: 29.1 pg (ref 26.0–34.0)
MCHC: 32.8 g/dL (ref 30.0–36.0)
MCV: 88.9 fL (ref 78.0–100.0)
PLATELETS: 239 10*3/uL (ref 150–400)
RBC: 3.33 MIL/uL — AB (ref 3.87–5.11)
RDW: 15.4 % (ref 11.5–15.5)
WBC: 9.5 10*3/uL (ref 4.0–10.5)

## 2015-11-16 LAB — COMPREHENSIVE METABOLIC PANEL
ALBUMIN: 2.2 g/dL — AB (ref 3.5–5.0)
ALT: 27 U/L (ref 14–54)
AST: 38 U/L (ref 15–41)
Alkaline Phosphatase: 86 U/L (ref 38–126)
Anion gap: 9 (ref 5–15)
BUN: 13 mg/dL (ref 6–20)
CHLORIDE: 94 mmol/L — AB (ref 101–111)
CO2: 33 mmol/L — AB (ref 22–32)
CREATININE: 0.7 mg/dL (ref 0.44–1.00)
Calcium: 8.6 mg/dL — ABNORMAL LOW (ref 8.9–10.3)
GFR calc non Af Amer: >60 mL/min (ref >60)
GLUCOSE: 109 mg/dL — AB (ref 65–99)
Potassium: 4 mmol/L (ref 3.5–5.1)
SODIUM: 136 mmol/L (ref 135–145)
Total Bilirubin: 1.4 mg/dL — ABNORMAL HIGH (ref 0.3–1.2)
Total Protein: 6 g/dL — ABNORMAL LOW (ref 6.5–8.1)

## 2015-11-16 LAB — PROTIME-INR
INR: 2.07 — AB (ref 0.00–1.49)
PROTHROMBIN TIME: 23.1 s — AB (ref 11.6–15.2)

## 2015-11-16 LAB — MAGNESIUM: Magnesium: 1.9 mg/dL (ref 1.7–2.4)

## 2015-11-16 SURGERY — CARDIOVERSION
Anesthesia: Monitor Anesthesia Care

## 2015-11-16 MED ORDER — WARFARIN SODIUM 4 MG PO TABS: 4.0000 mg | ORAL_TABLET | ORAL | Status: DC

## 2015-11-16 MED ORDER — FUROSEMIDE 20 MG PO TABS: 20.0000 mg | ORAL_TABLET | Freq: Every day | ORAL | Status: DC

## 2015-11-16 MED ORDER — AMIODARONE HCL 200 MG PO TABS
400.0000 mg | ORAL_TABLET | Freq: Two times a day (BID) | ORAL | Status: DC
  Administered 2015-11-16 – 2015-11-22 (×12): 400 mg via ORAL
  Filled 2015-11-16 (×12): qty 2

## 2015-11-16 MED ORDER — AMIODARONE HCL 400 MG PO TABS
400.0000 mg | ORAL_TABLET | Freq: Two times a day (BID) | ORAL | Status: DC
Start: 2015-11-16 — End: 2015-11-25

## 2015-11-16 MED ORDER — SORBITOL 70 % SOLN
30.0000 mL | Freq: Every day | Status: DC | PRN
  Filled 2015-11-16: qty 30

## 2015-11-16 MED ORDER — LORATADINE 10 MG PO TABS
10.0000 mg | ORAL_TABLET | Freq: Every day | ORAL | Status: DC
  Administered 2015-11-17 – 2015-11-20 (×4): 10 mg via ORAL
  Filled 2015-11-16 (×5): qty 1

## 2015-11-16 MED ORDER — AMIODARONE HCL 200 MG PO TABS
400.0000 mg | ORAL_TABLET | Freq: Two times a day (BID) | ORAL | Status: DC
  Administered 2015-11-16: 400 mg via ORAL
  Filled 2015-11-16: qty 2

## 2015-11-16 MED ORDER — BISACODYL 10 MG RE SUPP: 10.0000 mg | Freq: Every day | RECTAL | Status: DC | PRN

## 2015-11-16 MED ORDER — DICLOFENAC SODIUM 1 % TD GEL
2.0000 g | Freq: Four times a day (QID) | TRANSDERMAL | Status: DC
  Administered 2015-11-16 – 2015-11-24 (×22): 2 g via TOPICAL
  Filled 2015-11-16 (×2): qty 100

## 2015-11-16 MED ORDER — FOLIC ACID 1 MG PO TABS
1.0000 mg | ORAL_TABLET | Freq: Every day | ORAL | Status: DC
  Administered 2015-11-17 – 2015-11-25 (×9): 1 mg via ORAL
  Filled 2015-11-16 (×9): qty 1

## 2015-11-16 MED ORDER — ONDANSETRON HCL 4 MG PO TABS: 4.0000 mg | ORAL_TABLET | Freq: Four times a day (QID) | ORAL | Status: DC | PRN

## 2015-11-16 MED ORDER — WARFARIN - PHARMACIST DOSING INPATIENT
Freq: Every day | Status: DC
  Administered 2015-11-18 – 2015-11-19 (×2)

## 2015-11-16 MED ORDER — ONDANSETRON HCL 4 MG/2ML IJ SOLN: 4.0000 mg | Freq: Four times a day (QID) | INTRAMUSCULAR | Status: DC | PRN

## 2015-11-16 MED ORDER — ATORVASTATIN CALCIUM 10 MG PO TABS
10.0000 mg | ORAL_TABLET | Freq: Every day | ORAL | Status: DC
  Administered 2015-11-16 – 2015-11-24 (×9): 10 mg via ORAL
  Filled 2015-11-16 (×9): qty 1

## 2015-11-16 MED ORDER — FUROSEMIDE 20 MG PO TABS
20.0000 mg | ORAL_TABLET | Freq: Every day | ORAL | Status: DC
  Administered 2015-11-17 – 2015-11-21 (×5): 20 mg via ORAL
  Filled 2015-11-16 (×5): qty 1

## 2015-11-16 MED ORDER — ACETAMINOPHEN 650 MG RE SUPP
650.0000 mg | Freq: Four times a day (QID) | RECTAL | Status: DC | PRN
Start: 2015-11-16 — End: 2015-11-25

## 2015-11-16 MED ORDER — OXYCODONE-ACETAMINOPHEN 5-325 MG PO TABS
1.0000 | ORAL_TABLET | ORAL | Status: DC | PRN
  Administered 2015-11-23 – 2015-11-24 (×2): 1 via ORAL
  Filled 2015-11-16 (×2): qty 1

## 2015-11-16 MED ORDER — METHOCARBAMOL 1000 MG/10ML IJ SOLN: 500.0000 mg | Freq: Four times a day (QID) | INTRAVENOUS | Status: DC | PRN

## 2015-11-16 MED ORDER — ACETAMINOPHEN 325 MG PO TABS: 650.0000 mg | ORAL_TABLET | Freq: Four times a day (QID) | ORAL | Status: DC | PRN

## 2015-11-16 MED ORDER — SENNOSIDES-DOCUSATE SODIUM 8.6-50 MG PO TABS: 1.0000 | ORAL_TABLET | Freq: Every evening | ORAL | Status: DC | PRN

## 2015-11-16 MED ORDER — FLUTICASONE PROPIONATE 50 MCG/ACT NA SUSP
1.0000 | Freq: Every day | NASAL | Status: DC
  Administered 2015-11-18 – 2015-11-24 (×5): 1 via NASAL
  Filled 2015-11-16: qty 16

## 2015-11-16 MED ORDER — METHOCARBAMOL 500 MG PO TABS: 500.0000 mg | ORAL_TABLET | Freq: Four times a day (QID) | ORAL | Status: DC | PRN

## 2015-11-16 MED ORDER — DILTIAZEM HCL ER COATED BEADS 120 MG PO CP24
120.0000 mg | ORAL_CAPSULE | Freq: Every day | ORAL | Status: DC
Start: 2015-11-17 — End: 2015-11-17
  Administered 2015-11-17: 120 mg via ORAL
  Filled 2015-11-16: qty 1

## 2015-11-16 MED ORDER — WARFARIN SODIUM 3 MG PO TABS
3.0000 mg | ORAL_TABLET | ORAL | Status: DC
  Administered 2015-11-16 – 2015-11-19 (×4): 3 mg via ORAL
  Filled 2015-11-16 (×4): qty 1

## 2015-11-16 MED ORDER — POLYETHYLENE GLYCOL 3350 17 G PO PACK: 17.0000 g | PACK | Freq: Two times a day (BID) | ORAL | Status: DC

## 2015-11-16 MED ORDER — VITAMIN D (ERGOCALCIFEROL) 1.25 MG (50000 UNIT) PO CAPS
50000.0000 [IU] | ORAL_CAPSULE | ORAL | Status: DC
  Administered 2015-11-18 – 2015-11-25 (×2): 50000 [IU] via ORAL
  Filled 2015-11-16 (×2): qty 1

## 2015-11-16 NOTE — PMR Pre-admission (Signed)
PMR Admission Coordinator Pre-Admission Assessment  Patient: Kendra Hughes is an 82 y.o., female MRN: [id number] DOB: December 26, 1922 Height: 5\' 5"  (165.1 cm) Weight: 70.308 kg (155 lb)              Insurance Information HMO:     PPO:       PCP:       IPA:       80/20:       OTHER:   PRIMARY: Medicare A/B      Policy#:  [id number]      Subscriber:  Kendra Hughes CM Name:        Phone#:       Fax#:   Pre-Cert#:        Employer: Retired Benefits:  Phone #:       Name: Checked in [place]. Date: 03/08/90     Deduct: $1316      Out of Pocket Max: None      Life Max: unlimited CIR: 100%      SNF: 100 days Outpatient: 80%     Co-Pay: 20% Home Health: 100%      Co-Pay: none DME: 80%     Co-Pay: 20% Providers: patient's choice  SECONDARY:  [employer]      Policy#: [id number]      Subscriber: Kendra Hughes CM Name:        Phone#:       Fax#:   Pre-Cert#:        Employer: Retired Benefits:  Phone #: [phone]     Name:   Eff. Date:       Deduct:        Out of Pocket Max:        Life Max:   CIR:        SNF:   Outpatient:       Co-Pay:   Home Health:        Co-Pay:   DME:       Co-Pay:    Emergency Contact Information Contact Information    Name Relation Home Work Mobile   Earll,Wade Spouse [phone]     Malveaux,Phillip Son        Current Medical History  Patient Admitting Diagnosis:  Left displaced femoral neck fracture  History of Present Illness: A 82 y.o. right handed female with history of hypertension, chronic atrial fibrillation maintained on Coumadin with history of DCCV, diastolic congestive heart failure, vitamin D deficiency. Patient lives with spouse independent prior to admission. She still drives. One level home with 2 steps to entry. Presented 11/10/2015 after mechanical fall and presented to [facility]. X-rays and imaging revealed left hip displaced femoral neck fracture. She was transferred to [facility] and underwent left hip bipolar hemiarthroplasty through direct  anterior approach 11/11/2015 per Dr. Magnus Ivan. Hospital course pain management. Weightbearing as tolerated. Chronic Coumadin resumed with intravenous heparin for bridging. Acute blood loss anemia 9.0 and monitor. On 11/12/2015 patient with atrial fibrillation with RVR heart rate 110-130. Cardiology was consulted and placed on low dose beta blocker and Cardizem as well as intravenous amiodarone. She was slowly transitioned to PO. Mildly elevated troponin felt to be nonspecific. No plan for cardioversion at this time. Physical occupational therapy evaluation completed 11/12/2015 with recommendations of physical medicine rehabilitation consult. Patient to be admitted for a comprehensive inpatient rehabilitation program.    Past Medical History  Past Medical History  Diagnosis Date  . PAF (paroxysmal atrial fibrillation) (HCC)   . Hypertension   .  Dyslipidemia   . DOE (dyspnea on exertion)   . History of nuclear stress test 10/2009    normal exam  . Chronic diastolic (congestive) heart failure (HCC)   . DVT (deep venous thrombosis) (HCC) "years ago"  . Fracture of left hip (HCC) 11/09/2015    "fell"  . Arthritis     "right knee" (11/10/2015)  . Rheumatoid arthritis (HCC) 1970    "it's in remission" (11/10/2015)    Family History  family history includes Heart disease in her mother.  Prior Rehab/Hospitalizations: No previous rehab  Has the patient had major surgery during 100 days prior to admission? No  Current Medications   Current facility-administered medications:  .  0.9 %  sodium chloride infusion, , Intravenous, Continuous, Kathryne Hitch, MD, Last Rate: 20 mL/hr at 11/14/15 0145 .  0.9 %  sodium chloride infusion, , Intravenous, Continuous, Rollene Rotunda, MD, Last Rate: 20 mL/hr at 11/15/15 2207 .  acetaminophen (TYLENOL) tablet 650 mg, 650 mg, Oral, Q6H PRN, 650 mg at 11/15/15 1602 **OR** acetaminophen (TYLENOL) suppository 650 mg, 650 mg, Rectal, Q6H PRN, Kathryne Hitch, MD .  amiodarone (PACERONE) tablet 400 mg, 400 mg, Oral, BID, Rollene Rotunda, MD, 400 mg at 11/16/15 1104 .  antiseptic oral rinse (CPC / CETYLPYRIDINIUM CHLORIDE 0.05%) solution 7 mL, 7 mL, Mouth Rinse, BID, Nishant Dhungel, MD, 7 mL at 11/15/15 2207 .  atorvastatin (LIPITOR) tablet 10 mg, 10 mg, Oral, q1800, Lorretta Harp, MD, 10 mg at 11/15/15 1749 .  bisacodyl (DULCOLAX) suppository 10 mg, 10 mg, Rectal, Daily PRN, Richarda Overlie, MD, 10 mg at 11/15/15 1750 .  bisacodyl (DULCOLAX) suppository 10 mg, 10 mg, Rectal, Once, Richarda Overlie, MD, 10 mg at 11/14/15 1145 .  diltiazem (CARDIZEM CD) 24 hr capsule 120 mg, 120 mg, Oral, Daily, Michael Litter, MD, 120 mg at 11/16/15 1103 .  fluticasone (FLONASE) 50 MCG/ACT nasal spray 1 spray, 1 spray, Each Nare, Daily, Lorretta Harp, MD, 1 spray at 11/15/15 1111 .  folic acid (FOLVITE) tablet 1 mg, 1 mg, Oral, Daily, Lorretta Harp, MD, 1 mg at 11/16/15 1102 .  [START ON 11/17/2015] furosemide (LASIX) tablet 20 mg, 20 mg, Oral, Daily, Rollene Rotunda, MD .  HYDROcodone-acetaminophen (NORCO/VICODIN) 5-325 MG per tablet 1-2 tablet, 1-2 tablet, Oral, Q6H PRN, Kathryne Hitch, MD, 1 tablet at 11/14/15 [phone] .  loratadine (CLARITIN) tablet 10 mg, 10 mg, Oral, Daily, Lorretta Harp, MD, 10 mg at 11/16/15 1103 .  menthol-cetylpyridinium (CEPACOL) lozenge 3 mg, 1 lozenge, Oral, PRN **OR** phenol (CHLORASEPTIC) mouth spray 1 spray, 1 spray, Mouth/Throat, PRN, Kathryne Hitch, MD .  methocarbamol (ROBAXIN) tablet 500 mg, 500 mg, Oral, Q6H PRN, 500 mg at 11/14/15 0625 **OR** methocarbamol (ROBAXIN) 500 mg in dextrose 5 % 50 mL IVPB, 500 mg, Intravenous, Q6H PRN, Kathryne Hitch, MD .  metoCLOPramide (REGLAN) tablet 5-10 mg, 5-10 mg, Oral, Q8H PRN **OR** metoCLOPramide (REGLAN) injection 5-10 mg, 5-10 mg, Intravenous, Q8H PRN, Kathryne Hitch, MD .  ondansetron [facility]) tablet 4 mg, 4 mg, Oral, Q6H PRN **OR** ondansetron (ZOFRAN) injection 4 mg, 4 mg,  Intravenous, Q6H PRN, Kathryne Hitch, MD .  oxyCODONE-acetaminophen (PERCOCET/ROXICET) 5-325 MG per tablet 1 tablet, 1 tablet, Oral, Q4H PRN, Lorretta Harp, MD, 1 tablet at 11/14/15 2133 .  polyethylene glycol (MIRALAX / GLYCOLAX) packet 17 g, 17 g, Oral, BID, Richarda Overlie, MD, 17 g at 11/15/15 2206 .  senna-docusate (Senokot-S) tablet 1 tablet, 1 tablet, Oral, QHS PRN, Lorretta Harp,  MD .  Vitamin D (Ergocalciferol) (DRISDOL) capsule 50,000 Units, 50,000 Units, Oral, Weekly, Lorretta Harp, MD .  warfarin (COUMADIN) tablet 3 mg, 3 mg, Oral, Once per day on Sun Tue Wed Thu Fri Sat, Haley P Baird, [facility], 3 mg at 11/15/15 1749 .  [START ON 11/20/2015] warfarin (COUMADIN) tablet 4 mg, 4 mg, Oral, Q Mon-1800, Almon Hercules, RPH .  Warfarin - Pharmacist Dosing Inpatient, , Does not apply, q1800, Almon Hercules, [facility], 1 each at 11/15/15 1749  Patients Current Diet: Diet NPO time specified Diet - low sodium heart healthy  Precautions / Restrictions Precautions Precautions: Fall Restrictions Weight Bearing Restrictions: Yes LLE Weight Bearing: Weight bearing as tolerated   Has the patient had 2 or more falls or a fall with injury in the past year?Yes.  Reports 2-3 falls with the latest fall resulting in a fracture.  Prior Activity Level Limited Community (1-2x/wk): Went out 3-4 X a week, was driving  Journalist, newspaper / Corporate investment banker Devices/Equipment: Eyeglasses, Dentures (specify type), Grab bars around toilet, Grab bars in shower, Hand-held shower hose, Built-in shower seat Home Equipment: Grab bars - tub/shower, Shower seat - built in (Possibly a walker)  Prior Device Use: Indicate devices/aids used by the patient prior to current illness, exacerbation or injury? None  Prior Functional Level Prior Function Level of Independence: Independent Comments: Still drives, cooking./cleaning, shopping  Self Care: Did the patient need help bathing, dressing, using the toilet or eating?   Independent  Indoor Mobility: Did the patient need assistance with walking from room to room (with or without device)? Independent  Stairs: Did the patient need assistance with internal or external stairs (with or without device)? Independent  Functional Cognition: Did the patient need help planning regular tasks such as shopping or remembering to take medications? Independent  Current Functional Level Cognition  Overall Cognitive Status: Within Functional Limits for tasks assessed Orientation Level: Oriented X4    Extremity Assessment (includes Sensation/Coordination)  Upper Extremity Assessment: Generalized weakness  Lower Extremity Assessment: Defer to PT evaluation LLE Deficits / Details: Acute pain and decreased strength/AROM consistent with above mentioned procedure.     ADLs  Overall ADL's : Needs assistance/impaired Grooming: Moderate assistance, Sitting Upper Body Bathing: Moderate assistance, Sitting Lower Body Bathing: Maximal assistance, +2 for physical assistance, Sit to/from stand Upper Body Dressing : Moderate assistance, Sitting Upper Body Dressing Details (indicate cue type and reason): Assist for balance and donning gown. Lower Body Dressing: Maximal assistance, +2 for physical assistance, Sit to/from stand Toilet Transfer: Ambulation, BSC, RW, Minimal assistance (from high surface commode; mod from bed and regular chair) General ADL Comments: ambulated to chair in room and rested.  Plan was to stand at sink for grooming, but pt needed to use commode so performed SPT to this.  Pt returned to chair to complete grooming task.  HR 90-126.      Mobility  Overal bed mobility: Needs Assistance Bed Mobility: Supine to Sit Supine to sit: Min assist, HOB elevated General bed mobility comments: use of rail.  Cues for sequence    Transfers  Overall transfer level: Needs assistance Equipment used: Rolling walker (2 wheeled) Transfers: Sit to/from Stand Sit to Stand: Min  assist, Mod assist, +2 physical assistance (min from high commode; mod from bed and chair) Stand pivot transfers: Mod assist, +2 physical assistance General transfer comment: assist to rise and stabilize.  cues for UE placement    Ambulation / Gait / Stairs / Wheelchair Mobility  Ambulation/Gait Ambulation/Gait assistance: Min assist, +2 safety/equipment Ambulation Distance (Feet): 10 Feet (x2) Assistive device: Rolling walker (2 wheeled) Gait Pattern/deviations: Step-through pattern, Decreased stride length, Trunk flexed General Gait Details: Cues for sequencing and more upright posture.  pt needed close chair due to fatiguing easily.   Gait velocity: Decreased Gait velocity interpretation: Below normal speed for age/gender    Posture / Balance Dynamic Sitting Balance Sitting balance - Comments: holding onto rail can support herself, but unable to sit without holding on Balance Overall balance assessment: Needs assistance Sitting-balance support: Single extremity supported, Feet supported Sitting balance-Leahy Scale: Poor Sitting balance - Comments: holding onto rail can support herself, but unable to sit without holding on Postural control: Posterior lean Standing balance support: Bilateral upper extremity supported, During functional activity Standing balance-Leahy Scale: Poor Standing balance comment: UE support for balance    Special needs/care consideration BiPAP/CPAP No CPM No Continuous Drip IV KVO Dialysis No        Life Vest No Oxygen Has 02 2L [place] in hospital Special Bed No Trach Size No Wound Vac (area) No      Skin Left hip incision                              Bowel mgmt: Last BM 11/10/15 Bladder mgmt: some urinary incontinence Diabetic mgmt No    Previous Home Environment Living Arrangements: Spouse/significant other Available Help at Discharge: Family, Available 24 hours/day Type of Home: House Home Layout: One level Home Access: Stairs to enter Entrance  Stairs-Rails: Left Entrance Stairs-Number of Steps: 2 Bathroom Shower/Tub: Health visitor: Handicapped height Bathroom Accessibility: Yes Home Care Services: No  Discharge Living Setting Plans for Discharge Living Setting: House, Lives with (comment) (Lives with husband.) Type of Home at Discharge: House Discharge Home Layout: One level Discharge Home Access: Stairs to enter Entrance Stairs-Number of Steps: 2 at the front and 1 at the garage entry Does the patient have any problems obtaining your medications?: No  Social/Family/Support Systems Patient Roles: Spouse, Parent (Has a husband and 2 sons.) Contact Information: Amonda Brillhart - husband Anticipated Caregiver: husband Anticipated Caregiver's Contact Information: Thurmond Butts - husband (h) [phone] (c) [phone] Ability/Limitations of Caregiver: Husband can assist.  Sons live 30 minutes away. Caregiver Availability: 24/7 Discharge Plan Discussed with Primary Caregiver: Yes Is Caregiver In Agreement with Plan?: Yes Does Caregiver/Family have Issues with Lodging/Transportation while Pt is in Rehab?: No  Goals/Additional Needs Patient/Family Goal for Rehab: PT/OT supervision and min assist goals Expected length of stay: 10-14 days Cultural Considerations: Pentecostal holiness Dietary Needs: Low Sodium heart healthy diet Equipment Needs: TBD Pt/Family Agrees to Admission and willing to participate: Yes Program Orientation Provided & Reviewed with Pt/Caregiver Including Roles  & Responsibilities: Yes  Decrease burden of Care through IP rehab admission: N/A  Possible need for SNF placement upon discharge: Not planned  Patient Condition: This patient's medical and functional status has changed since the consult dated: 11/13/15 in which the Rehabilitation Physician determined and documented that the patient's condition is appropriate for intensive rehabilitative care in an inpatient rehabilitation facility. See  "History of Present Illness" (above) for medical update. Functional changes are:  Currently requiring min assist to ambulate 10 feet RW. Patient's medical and functional status update has been discussed with the Rehabilitation physician and patient remains appropriate for inpatient rehabilitation. Will admit to inpatient rehab today.  Preadmission Screen Completed By:  Trish Mage, 11/16/2015 3:03 PM  ______________________________________________________________________   Discussed status with Dr.  Wynn Banker on 11/16/15 at 1502 and received telephone approval for admission today.  Admission Coordinator:  Trish Mage, time1502/Date05/11/17

## 2015-11-16 NOTE — Care Management Note (Signed)
Case Management Note Donn Pierini RN, BSN Unit 2W-Case Manager [phone]  Patient Details  Name: Kendra Hughes MRN: [id number] Date of Birth: 11-30-1922  Subjective/Objective:   Pt admitted with hip fx after fall at home- s/p repair. Post op afib  Action/Plan: PTA pt lived at home- PT/OT evals recommending CIR vs SNF- per conversation with pt's family on 5/11- would like to look at SNF ([facility])- CSW has been consulted as is following for possible placement- CIR has also been consulted for possible admission  Expected Discharge Date:    11/16/15              Expected Discharge Plan:  IP Rehab Facility  In-House Referral:  Clinical Social Work  Discharge planning Services  CM Consult  Post Acute Care Choice:    Choice offered to:     DME Arranged:    DME Agency:     HH Arranged:    HH Agency:     Status of Service:  Completed, signed off  Medicare Important Message Given:  Yes Date Medicare IM Given:    Medicare IM give by:    Date Additional Medicare IM Given:    Additional Medicare Important Message give by:     If discussed at Long Length of Stay Meetings, dates discussed:  11/16/15  Additional Comments:  11/16/15- 1400- Donn Pierini RN, BSN - per Genie with CIR- family reconsidering CIR today- per CSW no beds available at preferred SNF location- pt for possible d/c later today to CIR  Darrold Span, RN 11/16/2015, 1:59 PM

## 2015-11-16 NOTE — Progress Notes (Signed)
SUBJECTIVE:  No SOB. No distress   PHYSICAL EXAM Filed Vitals:   11/16/15 0241 11/16/15 0318 11/16/15 0903 11/16/15 1103  BP: 109/51 116/65 129/95 102/56  Pulse: 77 110 96 72  Temp:  98.3 F (36.8 C)    TempSrc:  Oral    Resp:  18    Height:      Weight:      SpO2:  94%     General:  No acute distress Lungs:  Few dependent crackles Heart:  Irregular Abdomen:  Positive bowel sounds, no rebound no guarding Extremities:  No edema  Neuro:  Nonfocal  LABS: Lab Results  Component Value Date   TROPONINI 0.05* 11/13/2015   Results for orders placed or performed during the hospital encounter of 11/10/15 (from the past 24 hour(s))  Type and screen [facility]     Status: None   Collection Time: 11/15/15 11:45 AM  Result Value Ref Range   ABO/RH(D) O POS    Antibody Screen NEG    Sample Expiration 11/18/2015    Unit Number [id number]    Blood Component Type RBC LR PHER2    Unit division 00    Status of Unit ISSUED,FINAL    Transfusion Status OK TO TRANSFUSE    Crossmatch Result Compatible   Prepare RBC     Status: None   Collection Time: 11/15/15 11:45 AM  Result Value Ref Range   Order Confirmation ORDER PROCESSED BY BLOOD BANK   Protime-INR     Status: Abnormal   Collection Time: 11/16/15  3:18 AM  Result Value Ref Range   Prothrombin Time 23.1 (H) 11.6 - 15.2 seconds   INR 2.07 (H) 0.00 - 1.49  CBC     Status: Abnormal   Collection Time: 11/16/15  3:18 AM  Result Value Ref Range   WBC 9.5 4.0 - 10.5 K/uL   RBC 3.33 (L) 3.87 - 5.11 MIL/uL   Hemoglobin 9.7 (L) 12.0 - 15.0 g/dL   HCT 80.0 (L) 34.9 - 17.9 %   MCV 88.9 78.0 - 100.0 fL   MCH 29.1 26.0 - 34.0 pg   MCHC 32.8 30.0 - 36.0 g/dL   RDW 15.0 56.9 - 79.4 %   Platelets 239 150 - 400 K/uL  Comprehensive metabolic panel     Status: Abnormal   Collection Time: 11/16/15  3:18 AM  Result Value Ref Range   Sodium 136 135 - 145 mmol/L   Potassium 4.0 3.5 - 5.1 mmol/L   Chloride 94 (L) 101  - 111 mmol/L   CO2 33 (H) 22 - 32 mmol/L   Glucose, Bld 109 (H) 65 - 99 mg/dL   BUN 13 6 - 20 mg/dL   Creatinine, Ser 8.01 0.44 - 1.00 mg/dL   Calcium 8.6 (L) 8.9 - 10.3 mg/dL   Total Protein 6.0 (L) 6.5 - 8.1 g/dL   Albumin 2.2 (L) 3.5 - 5.0 g/dL   AST 38 15 - 41 U/L   ALT 27 14 - 54 U/L   Alkaline Phosphatase 86 38 - 126 U/L   Total Bilirubin 1.4 (H) 0.3 - 1.2 mg/dL   GFR calc non Af Amer >60 >60 mL/min   GFR calc Af Amer >60 >60 mL/min   Anion gap 9 5 - 15  Magnesium     Status: None   Collection Time: 11/16/15  8:26 AM  Result Value Ref Range   Magnesium 1.9 1.7 - 2.4 mg/dL    Intake/Output Summary (  Last 24 hours) at 11/16/15 1140 Last data filed at 11/16/15 1038  Gross per 24 hour  Intake    528 ml  Output   1425 ml  Net   -897 ml    ASSESSMENT AND PLAN:  ATRIAL FIB WITH RVR:   Her rate was better controlled even after stopping the IV amiodarone.  I am going to start PO.  I cancelled the cardioversion.  We can follow in rehab as she will be in patient.  She can have elective cardioversion in a few weeks if she is still in atrial fib.   As we get further out form the surgery I am now concerned more, in this elderly patient, about over blockade of the AV node and so I will stop the dig and beta blocker and continue the Cardizem and PO amio.  We can add back beta blocker if needed.    MILDLY ELEVATED TROPONIN:  Nonspecific.  Not a NSTEMI.   HeFPEF:    Euvolemic.   Fayrene Fearing [facility] 11/16/2015 11:40 AM

## 2015-11-16 NOTE — IPOC Note (Signed)
Overall Plan of Care [facility]) Patient Details Name: BRIELYNN SEKULA MRN: [id number] DOB: 1922-08-14  Admitting Diagnosis: AFIB  Hospital Problems: Active Problems:   Femoral neck fracture (HCC)   Atrial fibrillation with rapid ventricular response (HCC)   Primary osteoarthritis of right knee   S/P hip hemiarthroplasty   Hypoalbuminemia due to protein-calorie malnutrition (HCC)   Urinary retention   Absence of bladder continence   Insomnia     Functional Problem List: Nursing Bladder, Safety, Endurance, Medication Management, Motor, Pain  PT Balance, Endurance, Pain, Safety  OT Balance, Endurance, Safety  SLP    TR         Basic ADL's: OT Bathing, Dressing, Toileting     Advanced  ADL's: OT Simple Meal Preparation     Transfers: PT Bed Mobility, Bed to Chair, Car, Furniture, Floor  OT Toilet, Research scientist (life sciences): PT Ambulation, Psychologist, prison and probation services, Stairs     Additional Impairments: OT None  SLP        TR      Anticipated Outcomes Item Anticipated Outcome  Self Feeding Independent  Swallowing      Basic self-care  Mod I  Toileting  Mod I   Bathroom Transfers Supervision  Bowel/Bladder  bladder management with min assist/bowel management independent  Transfers  Mod I with LRAD  Locomotion  Supervision with LRAD   Communication     Cognition     Pain  n/a  Safety/Judgment  maintain safety with cues/supervison   Therapy Plan: PT Intensity: Minimum of 1-2 x/day ,45 to 90 minutes PT Frequency: 5 out of 7 days PT Duration Estimated Length of Stay: 6-10 days  OT Intensity: Minimum of 1-2 x/day, 45 to 90 minutes OT Frequency: 5 out of 7 days OT Duration/Estimated Length of Stay: 5-7 days         Team Interventions: Nursing Interventions Patient/Family Education, Pain Management, Discharge Planning, Medication Management, Bladder Management, Skin Care/Wound Management, Disease Management/Prevention  PT interventions Ambulation/gait training,  Warden/ranger, Community reintegration, Discharge planning, Disease management/prevention, DME/adaptive equipment instruction, Functional mobility training, Neuromuscular re-education, Pain management, Patient/family education, Psychosocial support, Skin care/wound management, Stair training, Therapeutic Activities, Therapeutic Exercise, UE/LE Strength taining/ROM, UE/LE Coordination activities, Visual/perceptual remediation/compensation, Wheelchair propulsion/positioning  OT Interventions Discharge planning, Functional mobility training, Patient/family education, Self Care/advanced ADL retraining, Therapeutic Activities, Therapeutic Exercise, Balance/vestibular training  SLP Interventions    TR Interventions    SW/CM Interventions Discharge Planning, Psychosocial Support, Patient/Family Education    Team Discharge Planning: Destination: PT-Home ,OT- Home , SLP-  Projected Follow-up: PT-Home health PT, OT-  Home health OT, SLP-  Projected Equipment Needs: PT-Wheelchair (measurements), Wheelchair cushion (measurements), Rolling walker with 5" wheels, To be determined, OT- To be determined, SLP-  Equipment Details: PT- , OT-  Patient/family involved in discharge planning: PT- Patient,  OT-Patient, SLP-   MD ELOS: 10-12 days. Medical Rehab Prognosis:  Good Assessment: 82 y.o. right handed female with history of hypertension, chronic atrial fibrillation maintained on Coumadin with history of DCCV, diastolic congestive heart failure, vitamin D deficiency. Patient lives with spouse independent prior to admission. She still drives. Presented 11/10/2015 after mechanical fall and presented to [facility]. X-rays and imaging revealed left hip displaced femoral neck fracture. She was transferred to [facility] and underwent left hip bipolar hemiarthroplasty through direct anterior approach 11/11/2015 per Dr. Magnus Ivan. Hospital course pain management. Weightbearing as tolerated.  Chronic Coumadin resumed with intravenous heparin for bridging. Acute blood loss anemia. On 11/12/2015 patient with  atrial fibrillation with RVR heart rate 110-130. Cardiology was consulted and placed on low dose beta blocker and Cardizem as well as intravenous amiodarone. She was slowly transitioned to PO. Mildly elevated troponin felt to be nonspecific. No plan for cardioversion at this time. Pt with functional deficts in mobility, strength, endurance, and pain. The team will be addressing functional mobility, strength, stamina, balance, safety, adaptive techniques and equipment, self-care, bowel and bladder mgt, patient and caregiver education, hip and ortho precautions, pain mgt, skin care, ego support, community reintegration. Goals have been set at Mod I to supervision for basic mobility/transfers and self-care/ADL's.    Ranelle Oyster, MD, FAAPMR     See Team Conference Notes for weekly updates to the plan of care

## 2015-11-16 NOTE — Progress Notes (Signed)
Rehab admissions - I met with patient, husband and her son, Marden Noble, today.  They are interested in inpatient rehab here at [facility].  They are also looking into SNF in [place].  Will allow patient time to discuss options with MD and determine possible need for cardioversion today.  I will check back with family later today about possible inpatient rehab admission.  Call me for questions.  #[phone]

## 2015-11-16 NOTE — Progress Notes (Signed)
Pt transferred to CIR room via w/c with all belongings. IV left in per CIR Gavin Pound, RN. Tele off and CCMD notified. Family with pt at the time of transfer.   Daneil Dan, RN

## 2015-11-16 NOTE — Progress Notes (Signed)
Report given to RN in CIR.

## 2015-11-16 NOTE — Discharge Summary (Signed)
Physician Discharge Summary  Kendra Hughes MRN: [id number] DOB/AGE: [birth year] 82 y.o.  PCP: Coy Saunas, MD   Admit date: 11/10/2015 Discharge date: 11/16/2015  Discharge Diagnoses:     Principal Problem:   Closed left hip fracture [facility]) Active Problems:   Persistent atrial fibrillation (HCC)   HTN (hypertension)   Hyperlipidemia   Chronic diastolic (congestive) heart failure (HCC)   Fracture of femoral neck, left (HCC)   Tachycardia   Acute blood loss anemia   Postoperative pain   Postoperative hypotension   Vitamin D deficiency   Hyponatremia   Status post hip hemiarthroplasty   Atrial fibrillation with RVR ([place])    Follow-up recommendations Follow-up with PCP in 3-5 days , including all  additional recommended appointments as below Follow-up CBC, CMP in 3-5 days Check INR daily      Current Discharge Medication List    START taking these medications   Details  amiodarone (PACERONE) 400 MG tablet Take 1 tablet (400 mg total) by mouth 2 (two) times daily. Qty: 60 tablet, Refills: 1    bisacodyl (DULCOLAX) 10 MG suppository Place 1 suppository (10 mg total) rectally daily as needed for moderate constipation. Qty: 12 suppository, Refills: 0    HYDROcodone-acetaminophen (NORCO/VICODIN) 5-325 MG tablet Take 1-2 tablets by mouth every 4 (four) hours as needed for moderate pain. Qty: 30 tablet, Refills: 0    polyethylene glycol (MIRALAX / GLYCOLAX) packet Take 17 g by mouth 2 (two) times daily. Qty: 14 each, Refills: 0      CONTINUE these medications which have CHANGED   Details  furosemide (LASIX) 20 MG tablet Take 1 tablet (20 mg total) by mouth daily. Qty: 30 tablet, Refills: 1      CONTINUE these medications which have NOT CHANGED   Details  atorvastatin (LIPITOR) 20 MG tablet Take 20 mg by mouth daily.    cholecalciferol (VITAMIN D) 1000 units tablet Take 1,000 Units by mouth daily.    diltiazem (CARDIZEM CD) 120 MG 24 hr capsule Take 1 capsule (120  mg total) by mouth daily. Qty: 90 capsule, Refills: 3    Fexofenadine HCl (ALLEGRA PO) Take 1 tablet by mouth as needed (allergies).     Fluticasone Propionate (FLONASE NA) Place 1 spray into the nose as needed (allergies).     folic acid (FOLVITE) 387 MCG tablet Take 800 mcg by mouth daily.    warfarin (COUMADIN) 3 MG tablet Take 3 mg by mouth daily.       STOP taking these medications     metoprolol (LOPRESSOR) 50 MG tablet      ergocalciferol (DRISDOL) 50000 units capsule          Discharge Condition: stable   Discharge Instructions Get Medicines reviewed and adjusted: Please take all your medications with you for your next visit with your Primary MD  Please request your Primary MD to go over all hospital tests and procedure/radiological results at the follow up, please ask your Primary MD to get all Hospital records sent to his/her office.  If you experience worsening of your admission symptoms, develop shortness of breath, life threatening emergency, suicidal or homicidal thoughts you must seek medical attention immediately by calling 911 or calling your MD immediately if symptoms less severe.  You must read complete instructions/literature along with all the possible adverse reactions/side effects for all the Medicines you take and that have been prescribed to you. Take any new Medicines after you have completely understood and accpet all the possible adverse  reactions/side effects.   Do not drive when taking Pain medications.   Do not take more than prescribed Pain, Sleep and Anxiety Medications  Special Instructions: If you have smoked or chewed Tobacco in the last 2 yrs please stop smoking, stop any regular Alcohol and or any Recreational drug use.  Wear Seat belts while driving.  Please note  You were cared for by a hospitalist during your hospital stay. Once you are discharged, your primary care physician will handle any further medical issues. Please note  that NO REFILLS for any discharge medications will be authorized once you are discharged, as it is imperative that you return to your primary care physician (or establish a relationship with a primary care physician if you do not have one) for your aftercare needs so that they can reassess your need for medications and monitor your lab values.  Discharge Instructions    Diet - low sodium heart healthy    Complete by:  As directed      Full weight bearing    Complete by:  As directed      Increase activity slowly    Complete by:  As directed             No Known Allergies    Disposition: 01-Home or Self Care   Consults:  cardiology   Significant Diagnostic Studies:  Dg Chest Port 1 View  11/12/2015  CLINICAL DATA:  82 year old female with tachycardia and AFib EXAM: PORTABLE CHEST 1 VIEW COMPARISON:  Chest radiograph dated 08/19/2013 and CT dated 10/07/2009 FINDINGS: Single portable view of the chest demonstrates emphysematous changes of the lungs with areas of stable scarring at the apices. There is no focal consolidation. There is mild blunting of the left costophrenic angle which may represent a small left pleural effusion. There is no pneumothorax with stable mild cardiomegaly. No acute osseous pathology identified. IMPRESSION: Emphysema with probable small left pleural effusion. No focal consolidation or pneumothorax. Electronically Signed   By: Anner Crete M.D.   On: 11/12/2015 18:47   Dg Hip Port Unilat With Pelvis 1v Left  11/10/2015  CLINICAL DATA:  Closed left hip fracture, initial visit. EXAM: DG HIP (WITH OR WITHOUT PELVIS) 1V PORT LEFT COMPARISON:  Left hip series of Nov 09, 2015 at 2200 hours. FINDINGS: The bony pelvis is diffusely osteopenic. No acute pelvic fracture is observed. There is an acute transverse subcapital fracture of the left hip with varus angulation. There is persistent superior migration of the femoral neck and a remainder of the femur superiorly with  respect to the femoral head and acetabulum. There is narrowing of the left hip joint space similar to that seen on the right. IMPRESSION: Acute subcapsular fracture of the left hip with superior migration of the left hip and femur. Electronically Signed   By: David  Martinique M.D.   On: 11/10/2015 08:29   Dg Hip Operative Unilat W Or W/o Pelvis Left  11/11/2015  CLINICAL DATA:  Femoral neck fracture. Initial encounter. EXAM: OPERATIVE left HIP (WITH PELVIS IF PERFORMED) AP VIEWS TECHNIQUE: Fluoroscopic spot image(s) were submitted for interpretation post-operatively. COMPARISON:  Radiography from yesterday FINDINGS: Bipolar left hip hemiarthroplasty without evidence of periprosthetic fracture or dislocation. IMPRESSION: Fluoroscopy for left hip hemiarthroplasty.  No adverse finding. Electronically Signed   By: Monte Fantasia M.D.   On: 11/11/2015 09:27        Filed Weights   11/10/15 0800  Weight: 70.308 kg (155 lb)     Microbiology: Recent  Results (from the past 240 hour(s))  Surgical pcr screen     Status: None   Collection Time: 11/10/15  6:06 PM  Result Value Ref Range Status   MRSA, PCR NEGATIVE NEGATIVE Final   Staphylococcus aureus NEGATIVE NEGATIVE Final    Comment:        The Xpert SA Assay (FDA approved for NASAL specimens in patients over 98 years of age), is one component of a comprehensive surveillance program.  Test performance has been validated by [facility] for patients greater than or equal to 33 year old. It is not intended to diagnose infection nor to guide or monitor treatment.        Blood Culture    Component Value Date/Time   SDES URINE, CATHETERIZED 10/09/2009 1538   SPECREQUEST IMMUNE:NORM UT SYMPT:NEG 10/09/2009 1538   CULT  10/09/2009 1538    DIPHTHEROIDS(CORYNEBACTERIUM SPECIES) Note: Standardized susceptibility testing for this organism is not available.   REPTSTATUS 10/11/2009 FINAL 10/09/2009 1538      Labs: Results for orders placed  or performed during the hospital encounter of 11/10/15 (from the past 48 hour(s))  Protime-INR     Status: Abnormal   Collection Time: 11/15/15  4:03 AM  Result Value Ref Range   Prothrombin Time 22.9 (H) 11.6 - 15.2 seconds   INR 2.04 (H) 0.00 - 1.49  CBC     Status: Abnormal   Collection Time: 11/15/15  4:03 AM  Result Value Ref Range   WBC 8.4 4.0 - 10.5 K/uL   RBC 2.80 (L) 3.87 - 5.11 MIL/uL   Hemoglobin 8.1 (L) 12.0 - 15.0 g/dL   HCT 24.9 (L) 36.0 - 46.0 %   MCV 88.9 78.0 - 100.0 fL   MCH 28.9 26.0 - 34.0 pg   MCHC 32.5 30.0 - 36.0 g/dL   RDW 15.4 11.5 - 15.5 %   Platelets 220 150 - 400 K/uL  Heparin level (unfractionated)     Status: None   Collection Time: 11/15/15  4:04 AM  Result Value Ref Range   Heparin Unfractionated 0.42 0.30 - 0.70 IU/mL    Comment:        IF HEPARIN RESULTS ARE BELOW EXPECTED VALUES, AND PATIENT DOSAGE HAS BEEN CONFIRMED, SUGGEST FOLLOW UP TESTING OF ANTITHROMBIN III LEVELS.   Type and screen [place]     Status: None   Collection Time: 11/15/15 11:45 AM  Result Value Ref Range   ABO/RH(D) O POS    Antibody Screen NEG    Sample Expiration 11/18/2015    Unit Number [id number]    Blood Component Type RBC LR PHER2    Unit division 00    Status of Unit ISSUED,FINAL    Transfusion Status OK TO TRANSFUSE    Crossmatch Result Compatible   Prepare RBC     Status: None   Collection Time: 11/15/15 11:45 AM  Result Value Ref Range   Order Confirmation ORDER PROCESSED BY BLOOD BANK   Protime-INR     Status: Abnormal   Collection Time: 11/16/15  3:18 AM  Result Value Ref Range   Prothrombin Time 23.1 (H) 11.6 - 15.2 seconds   INR 2.07 (H) 0.00 - 1.49  CBC     Status: Abnormal   Collection Time: 11/16/15  3:18 AM  Result Value Ref Range   WBC 9.5 4.0 - 10.5 K/uL   RBC 3.33 (L) 3.87 - 5.11 MIL/uL   Hemoglobin 9.7 (L) 12.0 - 15.0 g/dL   HCT 29.6 (  L) 36.0 - 46.0 %   MCV 88.9 78.0 - 100.0 fL   MCH 29.1 26.0 - 34.0 pg    MCHC 32.8 30.0 - 36.0 g/dL   RDW 15.4 11.5 - 15.5 %   Platelets 239 150 - 400 K/uL  Comprehensive metabolic panel     Status: Abnormal   Collection Time: 11/16/15  3:18 AM  Result Value Ref Range   Sodium 136 135 - 145 mmol/L   Potassium 4.0 3.5 - 5.1 mmol/L   Chloride 94 (L) 101 - 111 mmol/L   CO2 33 (H) 22 - 32 mmol/L   Glucose, Bld 109 (H) 65 - 99 mg/dL   BUN 13 6 - 20 mg/dL   Creatinine, Ser 0.70 0.44 - 1.00 mg/dL   Calcium 8.6 (L) 8.9 - 10.3 mg/dL   Total Protein 6.0 (L) 6.5 - 8.1 g/dL   Albumin 2.2 (L) 3.5 - 5.0 g/dL   AST 38 15 - 41 U/L   ALT 27 14 - 54 U/L   Alkaline Phosphatase 86 38 - 126 U/L   Total Bilirubin 1.4 (H) 0.3 - 1.2 mg/dL   GFR calc non Af Amer >60 >60 mL/min   GFR calc Af Amer >60 >60 mL/min    Comment: (NOTE) The eGFR has been calculated using the CKD EPI equation. This calculation has not been validated in all clinical situations. eGFR's persistently <60 mL/min signify possible Chronic Kidney Disease.    Anion gap 9 5 - 15  Magnesium     Status: None   Collection Time: 11/16/15  8:26 AM  Result Value Ref Range   Magnesium 1.9 1.7 - 2.4 mg/dL     Lipid Panel  No results found for: CHOL, TRIG, HDL, CHOLHDL, VLDL, LDLCALC, LDLDIRECT   No results found for: HGBA1C   Lab Results  Component Value Date   CREATININE 0.70 11/16/2015     HPI :*  -year-old female with history of hypertension, hyperlipidemia, chronic A. fib on Coumadin, diastolic CHF, vitamin D deficiency presented with fall at home with pain in her left hip. Patient reports getting out of shower and trying to get out of her towel when she fell backwards and landed on her hip. Denied loss of consciousness or sustaining any other injuries. Patient reports that she has unsteady gait and does tend to sway backwards but holds on to things.  In the ED patient's vitals were stable. WBC of 15.6 with normal electrolytes and INR of 2.2. X-ray of the left hip showed acute subcapsular fracture  of left hip with superior migration of the left hip and femur.   S/P Left hemiarthroplasty with Dr. Ninfa Linden on 11/11/2015.Transfered to cardiac telemetry for uncontrolled atrial fibrillation on amiodarone drip  Subjective:   Patient resting comfortably in bed, denies any chest pain or shortness of breath, NPO for procedure   Assessment & Plan :     Closed left hip fracture (HCC) S/P Left hemiarthroplasty with Dr. Ninfa Linden on 11/11/2015. Patient does not have any signs or symptoms of CHF Postoperatively, , TEE from 2015 shows EF of 55-60%. Patient has moderate risk for postoperative cardiac complications. No preoperative cardiac workup was recommended. She received Vitamin K pre-operatively. Warfarin resumed postoperatively Plan is to transfer to CIR when medically stable    Persistent atrial fibrillation [facility]) Cardiology titrating digoxin, Cardizem, metoprolol , amiodarone dosing CHADS2VASC = 5 (age, female gender, hypertension, HF). 2-D echo shows EF of 60-65% Warfarin management per pharmacy. Cardiology managing Lasix started on IV amiodarone  5/9 given continued rapid rate. Possible transition to PO today . She might need a TEE/DCCV before discharge as per cardiology. Discontinue heparin if INR greater than 2 Her rate was better controlled even after stopping the IV amiodarone. Patient transition to PO amiodarone and cardioversion canceled.Cardiology to follow in rehab as she will be in patient. She can have elective cardioversion in a few weeks if she is still in atrial fib.    HTN (hypertension) Blood pressure soft, currently on Cardizem and metoprolol  Acute blood loss anemia-hemoglobin has trended down during this admission 12.6 on 11/10/15, to 8.1, status post one unit of packed red blood cells on 5/10 . Monitor INR daily basis    Hyperlipidemia Continue Lipitor   Chronic diastolic (congestive) heart failure (HCC) Euvolemic. 2-D eEF  60-65%  Leukocytosis-resolved  Possibly reactive.   Constipation-started the patient on MiraLAX, will add Dulcolax suppository  Code Status : Full code       Discharge Exam:    Blood pressure 102/56, pulse 72, temperature 98.3 F (36.8 C), temperature source Oral, resp. rate 18, height _0  (1.651 m), weight 70.308 kg (155 lb), SpO2 94 %.      Follow-up Information    Follow up with Mcarthur Rossetti, MD. Schedule an appointment as soon as possible for a visit in 2 weeks.   Specialty:  Orthopedic Surgery   Contact information:   [place] [address] [phone]       Signed: Reyne Dumas 11/16/2015, 1:37 PM        Time spent >45 mins

## 2015-11-16 NOTE — Evaluation (Signed)
Occupational Therapy Assessment and Plan  Patient Details  Name: Kendra Hughes MRN: [id number] Date of Birth: October 01, 1922  OT Diagnosis: muscle weakness (generalized) Rehab Potential: Rehab Potential (ACUTE ONLY): Excellent ELOS: 5-7 days   Today's Date: 11/17/2015 OT Individual Time: 1100-1200 OT Individual Time Calculation (min): 60 min     Problem List:  Patient Active Problem List   Diagnosis Date Noted  . S/P hip hemiarthroplasty   . Hypoalbuminemia due to protein-calorie malnutrition ([place])   . Urinary retention   . Absence of bladder continence   . Insomnia   . Femoral neck fracture ([place]) 11/16/2015  . Atrial fibrillation with rapid ventricular response ([place])   . Primary osteoarthritis of right knee   . Atrial fibrillation with RVR ([place])   . Fracture of femoral neck, left ([place])   . Tachycardia   . Acute blood loss anemia   . Postoperative pain   . Postoperative hypotension   . Vitamin D deficiency   . Hyponatremia   . Status post hip hemiarthroplasty   . Closed left hip fracture ([place]) 11/10/2015  . Chronic diastolic (congestive) heart failure ([place])   . Hypotension 08/26/2013  . Mild AI, MR, mod TR by TEE 08/23/13 08/26/2013  . Persistent atrial fibrillation ([place]) 02/12/2013  . HTN (hypertension) 02/12/2013  . Hyperlipidemia 02/12/2013  . DOE (dyspnea on exertion) 02/12/2013    Past Medical History:  Past Medical History  Diagnosis Date  . PAF (paroxysmal atrial fibrillation) ([place])   . Hypertension   . Dyslipidemia   . DOE (dyspnea on exertion)   . History of nuclear stress test 10/2009    normal exam  . Chronic diastolic (congestive) heart failure ([place])   . DVT (deep venous thrombosis) ([place]) "years ago"  . Fracture of left hip ([place]) 11/09/2015    "fell"  . Arthritis     "right knee" (11/10/2015)  . Rheumatoid arthritis ([place]) 1970    "it's in remission" (11/10/2015)   Past Surgical History:  Past Surgical History  Procedure Laterality Date  . Abdominal  hysterectomy  1996  . Breast biopsy Right 1984    "benign"  . Transthoracic echocardiogram  07/22/2012    EF 55-60%; mild AV regurg; mild MR; mild-mod TR  . Tee without cardioversion N/A 11/01/2013    Procedure: TRANSESOPHAGEAL ECHOCARDIOGRAM (TEE);  Surgeon: Pixie Casino, MD;  Location[facility] ENDOSCOPY;  Service: Cardiovascular;  Laterality: N/A;  . Cardioversion N/A 11/01/2013    Procedure: CARDIOVERSION;  Surgeon: Pixie Casino, MD;  Location: [facility] ENDOSCOPY;  Service: Cardiovascular;  Laterality: N/A;  . Tonsillectomy    . Cataract extraction w/ intraocular lens  implant, bilateral Bilateral 2003  . Bladder suspension  X 2  . Rectocele repair    . Total hip arthroplasty Left 11/11/2015    Procedure:  HEMI ARTHROPLASTY ANTERIOR APPROACH LEFT;  Surgeon: Mcarthur Rossetti, MD;  Location: [facility];  Service: Orthopedics;  Laterality: Left;    Assessment & Plan Clinical Impression: Patient is an 82 y.o. right handed female with history of hypertension, chronic atrial fibrillation maintained on Coumadin with history of DCCV, diastolic congestive heart failure, vitamin D deficiency. Patient lives with spouse independent prior to admission. She still drives. One level home with 2 steps to entry. Presented 11/10/2015 after mechanical fall and presented to [facility] and imaging revealed left hip displaced femoral neck fracture. She was transferred to [facility] and underwent left hip bipolar hemiarthroplasty through direct anterior approach 11/11/2015 per Dr. Ninfa Linden. Hospital  course pain management. Weightbearing as tolerated. Chronic Coumadin resumed with intravenous heparin for bridging. Acute blood loss anemia 9.0 and monitor. On 11/12/2015 patient with atrial fibrillation with RVR heart rate 110-130. Cardiology was consulted and placed on low dose beta blocker and Cardizem as well as intravenous amiodarone. She was slowly transitioned to PO. Mildly elevated troponin felt to be  nonspecific. No plan for cardioversion at this time.  Patient transferred to CIR on 11/16/2015 .    Patient currently requires minimum assistance with basic self-care skills secondary to muscle weakness and muscle joint tightness.  Prior to hospitalization, patient could complete BADL and iADL independently.   Patient will benefit from skilled intervention to increase independence with basic self-care skills and increase level of independence with iADL prior to discharge home with care partner.  Anticipate patient will require intermittent supervision and no further OT follow recommended.  OT - End of Session Activity Tolerance: Tolerates 30+ min activity with multiple rests Endurance Deficit: Yes OT Assessment Rehab Potential (ACUTE ONLY): Excellent OT Patient demonstrates impairments in the following area(s): Balance;Endurance;Safety OT Basic ADL's Functional Problem(s): Bathing;Dressing;Toileting OT Advanced ADL's Functional Problem(s): Simple Meal Preparation OT Transfers Functional Problem(s): Toilet;Tub/Shower OT Additional Impairment(s): None OT Plan OT Intensity: Minimum of 1-2 x/day, 45 to 90 minutes OT Frequency: 5 out of 7 days OT Duration/Estimated Length of Stay: 5-7 days OT Treatment/Interventions: Discharge planning;Functional mobility training;Patient/family education;Self Care/advanced ADL retraining;Therapeutic Activities;Therapeutic Exercise;Balance/vestibular training OT Self Feeding Anticipated Outcome(s): Independent OT Basic Self-Care Anticipated Outcome(s): Mod I OT Toileting Anticipated Outcome(s): Mod I OT Bathroom Transfers Anticipated Outcome(s): Supervision OT Recommendation Patient destination: Home Follow Up Recommendations: Home health OT Equipment Recommended: To be determined   Skilled Therapeutic Intervention OT 1:1 eval with treatment provided to address pt/family education on methods and goals of treatment, transfer training, DME use, and adapted  bathing/dressing skills.   Pt not authorized to bathe at shower level d/t dressing at left hip.   Pt completed transfers with overall steadying assist and bathed to her satisfaction (peri-area deferred d/t recent catheterization and assist with hygiene).   Pt declined dressing and recovered to recliner at end of session with all needs within reach.   OT Evaluation Precautions/Restrictions  Precautions Precautions: Fall Restrictions Weight Bearing Restrictions: Yes LLE Weight Bearing: Weight bearing as tolerated   General Chart Reviewed: Yes Family/Caregiver Present: Yes (Husband and son Nathaneil Canary))  Vital Signs Therapy Vitals Temp: 98 F (36.7 C) Temp Source: Oral Pulse Rate: 100 Resp: 18 BP: 123/69 mmHg Patient Position (if appropriate): Sitting Oxygen Therapy SpO2: 96 % O2 Device: Not Delivered   Pain Pain Assessment Pain Assessment: No/denies pain  Home Living/Prior Functioning Home Living Living Arrangements: Spouse/significant other Available Help at Discharge: Family, Available 24 hours/day Type of Home: House Home Access: Stairs to enter Technical brewer of Steps: 2 Entrance Stairs-Rails: Left Home Layout: One level Bathroom Shower/Tub: Walk-in shower (built-in seat, pt doesn't use.) Bathroom Toilet: Handicapped height Bathroom Accessibility: Yes Additional Comments: Fully accessible bathroom (new addition) with grab bars in shower and by toilet  Lives With: Spouse IADL History Homemaking Responsibilities: Yes Meal Prep Responsibility: Primary Laundry Responsibility: Primary Cleaning Responsibility: Secondary Bill Paying/Finance Responsibility: Primary Shopping Responsibility: Primary Child Care Responsibility: No Current License: Yes Mode of Transportation: Car Education: HS Occupation: Other (comment) (primarily Materials engineer) Leisure and Hobbies: cross word puzzles Prior Function Level of Independence: Independent with basic ADLs  Able to Take  Stairs?: Yes Driving: Yes Vocation: Retired Leisure: Hobbies-yes (Comment) Comments: Still drives, cooking./cleaning,  shopping  ADL ADL ADL Comments: See Functional Assessment Tool  Vision/Perception  Vision- History Baseline Vision/History: Wears glasses (hx of cataract surgery) Wears Glasses: Reading only   Cognition Overall Cognitive Status: Within Functional Limits for tasks assessed Arousal/Alertness: Awake/alert Orientation Level: Person;Place;Situation Person: Oriented Place: Oriented Situation: Oriented Year: 2017 Month: May Day of Week: Correct Memory: Appears intact Immediate Memory Recall: Sock;Blue;Bed Memory Recall: Sock;Blue;Bed Memory Recall Sock: Without Cue Memory Recall Blue: Without Cue Memory Recall Bed: Without Cue Attention: Alternating Awareness: Appears intact Problem Solving: Appears intact  Sensation Sensation Light Touch: Appears Intact Stereognosis: Appears Intact Hot/Cold: Appears Intact Proprioception: Appears Intact Coordination Gross Motor Movements are Fluid and Coordinated: Yes Fine Motor Movements are Fluid and Coordinated: Yes  Motor  Motor Motor: Within Functional Limits  Mobility  Bed Mobility Bed Mobility: Rolling Left;Left Sidelying to Sit;Supine to Sit;Sitting - Scoot to [employer] of Bed Rolling Left: 4: Min assist Rolling Left Details: Tactile cues for initiation;Verbal cues for technique Left Sidelying to Sit: 3: Mod assist Left Sidelying to Sit Details: Manual facilitation for weight shifting;Verbal cues for technique Supine to Sit: 3: Mod assist Sitting - Scoot to Edge of Bed: 5: Supervision Transfers Transfers: Sit to Stand;Stand to Sit Sit to Stand: 4: Min assist Stand to Sit: 5: Supervision   Trunk/Postural Assessment  Cervical Assessment Cervical Assessment: Within Functional Limits Thoracic Assessment Thoracic Assessment: Within Functional Limits Lumbar Assessment Lumbar Assessment: Within Functional  Limits Postural Control Postural Control: Within Functional Limits   Balance Balance Balance Assessed: Yes Static Sitting Balance Static Sitting - Level of Assistance: 5: Stand by assistance Dynamic Sitting Balance Dynamic Sitting - Balance Support: Feet supported Dynamic Sitting - Level of Assistance: 5: Stand by assistance Dynamic Sitting - Balance Activities: Reaching across midline;Lateral lean/weight shifting;Reaching for objects Static Standing Balance Static Standing - Balance Support: Bilateral upper extremity supported Static Standing - Level of Assistance: 5: Stand by assistance Dynamic Standing Balance Dynamic Standing - Balance Support: Left upper extremity supported Dynamic Standing - Level of Assistance: 4: Min assist Dynamic Standing - Balance Activities: Reaching across midline  Extremity/Trunk Assessment RUE Assessment RUE Assessment: Within Functional Limits LUE Assessment LUE Assessment: Within Functional Limits   See Function Navigator for Current Functional Status.   Refer to Care Plan for Long Term Goals  Recommendations for other services: None  Discharge Criteria: Patient will be discharged from OT if patient refuses treatment 3 consecutive times without medical reason, if treatment goals not met, if there is a change in medical status, if patient makes no progress towards goals or if patient is discharged from hospital.  The above assessment, treatment plan, treatment alternatives and goals were discussed and mutually agreed upon: by patient  [facility] 11/17/2015, 3:37 PM

## 2015-11-16 NOTE — Progress Notes (Signed)
Amino Drip discontinued  Per Dr. Antoine Poche   @  8:55am  Governor Specking, RN

## 2015-11-16 NOTE — Progress Notes (Signed)
Physical Therapy Treatment Patient Details Name: Kendra Hughes MRN: [id number] DOB: [birth year] Today's Date: 11/16/2015    History of Present Illness Pt is a 82 y/o female who sustained a displaced L femoral neck fracture after a mechanical fall on 11/09/15. Pt is now s/p L hip hemiarthroplasty and has no precautions.     PT Comments    Pt able to ambulate short distances today x2 with HR remaining 100 - 120's.  Pt would still benefit from CIR level of therapies at D/C to maximize independence.  Will continue to follow.    Follow Up Recommendations  CIR     Equipment Recommendations  Rolling walker with 5" wheels    Recommendations for Other Services       Precautions / Restrictions Precautions Precautions: Fall Restrictions Weight Bearing Restrictions: Yes LLE Weight Bearing: Weight bearing as tolerated    Mobility  Bed Mobility Overal bed mobility: Needs Assistance Bed Mobility: Supine to Sit     Supine to sit: Min assist;HOB elevated     General bed mobility comments: use of rail.  Cues for sequence  Transfers Overall transfer level: Needs assistance Equipment used: Rolling walker (2 wheeled) Transfers: Sit to/from Stand Sit to Stand: Min assist;Mod assist;+2 physical assistance (min from high commode; mod from bed and chair)         General transfer comment: assist to rise and stabilize.  cues for UE placement  Ambulation/Gait Ambulation/Gait assistance: Min assist;+2 safety/equipment Ambulation Distance (Feet): 10 Feet (x2) Assistive device: Rolling walker (2 wheeled) Gait Pattern/deviations: Step-through pattern;Decreased stride length;Trunk flexed     General Gait Details: Cues for sequencing and more upright posture.  pt needed close chair due to fatiguing easily.     Stairs            Wheelchair Mobility    Modified Rankin (Stroke Patients Only)       Balance Overall balance assessment: Needs assistance Sitting-balance support: Single  extremity supported;Feet supported Sitting balance-Leahy Scale: Poor   Postural control: Posterior lean Standing balance support: Bilateral upper extremity supported;During functional activity Standing balance-Leahy Scale: Poor                      Cognition Arousal/Alertness: Awake/alert Behavior During Therapy: WFL for tasks assessed/performed Overall Cognitive Status: Within Functional Limits for tasks assessed                      Exercises      General Comments        Pertinent Vitals/Pain Pain Assessment: No/denies pain    Home Living Family/patient expects to be discharged to:: Inpatient rehab                    Prior Function            PT Goals (current goals can now be found in the care plan section) Acute Rehab PT Goals Patient Stated Goal: Get better, return to independence/cooking for her family PT Goal Formulation: With patient/family Time For Goal Achievement: 11/26/15 Potential to Achieve Goals: Good Progress towards PT goals: Progressing toward goals    Frequency  Min 5X/week    PT Plan Current plan remains appropriate    Co-evaluation PT/OT/SLP Co-Evaluation/Treatment: Yes Reason for Co-Treatment: For patient/therapist safety PT goals addressed during session: Mobility/safety with mobility;Balance;Proper use of DME OT goals addressed during session: ADL's and self-care     End of Session Equipment Utilized During Treatment: Gait belt  Activity Tolerance: Patient tolerated treatment well Patient left: in chair;with call bell/phone within reach;with chair alarm set;with family/visitor present     Time: 5945-8592 PT Time Calculation (min) (ACUTE ONLY): 35 min  Charges:  $Gait Training: 8-22 mins                    G CodesSunny Schlein, [place] [phone] 11/16/2015, 1:00 PM

## 2015-11-16 NOTE — Progress Notes (Addendum)
PROGRESS NOTE                                                                                                                                                                                                             Patient Demographics:    Kendra Hughes, is a 82 y.o. female, DOB - 1922/10/14, UTM:[id number]  Admit date - 11/10/2015   Admitting Physician Lorretta Harp, MD  Outpatient Primary MD for the patient is Carmin Richmond, MD  LOS - 6  Outpatient Specialists: Dr. Rennis Golden (cardiology)     Brief Narrative   82 year old female with history of hypertension, hyperlipidemia, chronic A. fib on Coumadin, diastolic CHF, vitamin D deficiency presented with fall at home with pain in her left hip. Patient reports getting out of shower and trying to get out of her towel when she fell backwards and landed on her hip. Denied loss of consciousness or sustaining any other injuries. Patient reports that she has unsteady gait and does tend to sway backwards but holds on to things.  In the ED patient's vitals were stable.  WBC of 15.6 with normal electrolytes and INR of 2.2. X-ray of the left hip showed acute subcapsular fracture of left hip with superior migration of the left hip and femur.    S/P Left hemiarthroplasty with Dr. Magnus Ivan on 11/11/2015.Transfered to cardiac telemetry for uncontrolled atrial fibrillation on amiodarone drip   Subjective:   Patient resting comfortably in bed, denies any chest pain or shortness of breath, NPO for procedure     Assessment  & Plan :       Closed left hip fracture (HCC) S/P Left hemiarthroplasty with Dr. Magnus Ivan on 11/11/2015. Patient does not have any signs or symptoms of CHF Postoperatively,  , TEE from 2015 shows EF of 55-60%. Patient has moderate risk for postoperative cardiac complications. No preoperative cardiac workup was recommended. She received Vitamin K pre-operatively.  Warfarin resumed  postoperatively Plan is to transfer to CIR when medically stable     Persistent atrial fibrillation [facility]) Cardiology titrating digoxin, Cardizem, metoprolol , amiodarone dosing CHADS2VASC = 5 (age, female gender, hypertension, HF). 2-D echo shows EF of 60-65% Warfarin management per pharmacy. Cardiology managing Lasix  started on IV amiodarone 5/9 given continued rapid rate. Possible transition to PO today . She might need a TEE/DCCV before discharge as per  cardiology. Discontinue heparin if INR greater than 2 NPO today per cardiology for possible  TEE/DCCV     HTN (hypertension) Blood pressure soft, currently on Cardizem and metoprolol  Acute blood loss anemia-hemoglobin has trended down during this admission 12.6 on 11/10/15,  to 8.1,  status post one unit of packed red blood cells on 5/10     Hyperlipidemia Continue Lipitor    Chronic diastolic (congestive) heart failure (HCC) Euvolemic. 2-D eEF 60-65%   Leukocytosis-resolved  Possibly reactive.   Constipation-started the patient on MiraLAX, will add Dulcolax suppository  Code Status : Full code  Family Communication  : Husband and son at bedside  Disposition Plan  : SNF versus CIR.  not stable for discharge pending cardiology recommendations      Barriers For Discharge :  Uncontrolled A. fib  Consults  :   Orthopedics (Dr. Magnus Ivan)  Procedures  :  S/P left hemiarthroplasty 5/6  DVT Prophylaxis  :  Warfarin  Lab Results  Component Value Date   PLT 239 11/16/2015    Antibiotics  :   Cefazolin 5/6      Objective:   Filed Vitals:   11/15/15 1944 11/15/15 2000 11/16/15 0241 11/16/15 0318  BP: 101/61 102/61 109/51 116/65  Pulse: 69 80 77 110  Temp: 98 F (36.7 C)   98.3 F (36.8 C)  TempSrc: Oral   Oral  Resp: 18   18  Height:      Weight:      SpO2: 94%   94%    Wt Readings from Last 3 Encounters:  11/10/15 70.308 kg (155 lb)  08/21/15 70.67 kg (155 lb 12.8 oz)  02/17/15 67.813 kg (149  lb 8 oz)     Intake/Output Summary (Last 24 hours) at 11/16/15 0829 Last data filed at 11/15/15 1700  Gross per 24 hour  Intake    768 ml  Output    825 ml  Net    -57 ml     Physical Exam  Gen: NAD, nontoxic appearing but pale HEENT: mucous membranes slightly dry, dry lips, dry tongue Pulm: CTA listening anteriorly CVS: Irregularly irregular, mildly tachycardic GI: soft, NT, ND, BS+ Musculoskeletal: limited mobility of left hip, small amount of blood on dressing.  Left thigh bruising anticipated but now indurations. CNS: Alert and oriented, nonfocal    Data Review:    CBC  Recent Labs Lab 11/12/15 1354 11/13/15 0506 11/14/15 0540 11/15/15 0403 11/16/15 0318  WBC 8.6 8.9 8.5 8.4 9.5  HGB 10.8* 9.0* 8.7* 8.1* 9.7*  HCT 33.3* 28.1* 26.9* 24.9* 29.6*  PLT 212 178 190 220 239  MCV 92.0 91.2 91.2 88.9 88.9  MCH 29.8 29.2 29.5 28.9 29.1  MCHC 32.4 32.0 32.3 32.5 32.8  RDW 15.5 15.5 15.4 15.4 15.4    Chemistries   Recent Labs Lab 11/10/15 0441 11/12/15 0314 11/13/15 0506 11/14/15 0540 11/16/15 0318  NA 136 133* 132* 132* 136  K 4.5 4.2 3.8 3.8 4.0  CL 99* 95* 98* 94* 94*  CO2 27 28 26 29  33*  GLUCOSE 144* 126* 113* 102* 109*  BUN 17 25* 16 14 13   CREATININE 0.81 1.00 0.73 0.69 0.70  CALCIUM 9.2 8.8* 8.0* 8.1* 8.6*  AST  --   --   --  23 38  ALT  --   --   --  13* 27  ALKPHOS  --   --   --  58 86  BILITOT  --   --   --  0.9 1.4*   Coagulation profile  Recent Labs Lab 11/12/15 0314 11/13/15 0506 11/14/15 0540 11/15/15 0403 11/16/15 0318  INR 1.38 1.64* 1.94* 2.04* 2.07*  ------------------------------------------------------------------------------------------------------------------    Component Value Date/Time   BNP 457.9* 11/12/2015 2103    Inpatient Medications  Scheduled Meds: . antiseptic oral rinse  7 mL Mouth Rinse BID  . atorvastatin  10 mg Oral q1800  . bisacodyl  10 mg Rectal Once  . digoxin  0.125 mg Oral Daily  . diltiazem   120 mg Oral Daily  . fluticasone  1 spray Each Nare Daily  . folic acid  1 mg Oral Daily  . furosemide  40 mg Oral Daily  . loratadine  10 mg Oral Daily  . metoprolol tartrate  12.5 mg Oral Q6H  . polyethylene glycol  17 g Oral BID  . Vitamin D (Ergocalciferol)  50,000 Units Oral Weekly  . warfarin  3 mg Oral Once per day on Sun Tue Wed Thu Fri Sat  . [START ON 11/20/2015] warfarin  4 mg Oral Q Mon-1800  . Warfarin - Pharmacist Dosing Inpatient   Does not apply q1800   Continuous Infusions: . sodium chloride 20 mL/hr at 11/14/15 0145  . sodium chloride 20 mL/hr at 11/15/15 2207  . amiodarone 30 mg/hr (11/16/15 0241)   PRN Meds:.acetaminophen **OR** acetaminophen, bisacodyl, HYDROcodone-acetaminophen, menthol-cetylpyridinium **OR** phenol, methocarbamol **OR** methocarbamol (ROBAXIN)  IV, metoCLOPramide **OR** metoCLOPramide (REGLAN) injection, ondansetron **OR** ondansetron (ZOFRAN) IV, oxyCODONE-acetaminophen, senna-docusate  Micro Results Recent Results (from the past 240 hour(s))  Surgical pcr screen     Status: None   Collection Time: 11/10/15  6:06 PM  Result Value Ref Range Status   MRSA, PCR NEGATIVE NEGATIVE Final   Staphylococcus aureus NEGATIVE NEGATIVE Final    Comment:        The Xpert SA Assay (FDA approved for NASAL specimens in patients over 35 years of age), is one component of a comprehensive surveillance program.  Test performance has been validated by [facility] for patients greater than or equal to 2 year old. It is not intended to diagnose infection nor to guide or monitor treatment.     Radiology Reports Dg Chest Port 1 View  11/12/2015  CLINICAL DATA:  82 year old female with tachycardia and AFib EXAM: PORTABLE CHEST 1 VIEW COMPARISON:  Chest radiograph dated 08/19/2013 and CT dated 10/07/2009 FINDINGS: Single portable view of the chest demonstrates emphysematous changes of the lungs with areas of stable scarring at the apices. There is no focal  consolidation. There is mild blunting of the left costophrenic angle which may represent a small left pleural effusion. There is no pneumothorax with stable mild cardiomegaly. No acute osseous pathology identified. IMPRESSION: Emphysema with probable small left pleural effusion. No focal consolidation or pneumothorax. Electronically Signed   By: Elgie Collard M.D.   On: 11/12/2015 18:47   Dg Hip Port Unilat With Pelvis 1v Left  11/10/2015  CLINICAL DATA:  Closed left hip fracture, initial visit. EXAM: DG HIP (WITH OR WITHOUT PELVIS) 1V PORT LEFT COMPARISON:  Left hip series of Nov 09, 2015 at 2200 hours. FINDINGS: The bony pelvis is diffusely osteopenic. No acute pelvic fracture is observed. There is an acute transverse subcapital fracture of the left hip with varus angulation. There is persistent superior migration of the femoral neck and a remainder of the femur superiorly with respect to the femoral head and acetabulum. There is narrowing of the left hip joint space similar to that seen on the  right. IMPRESSION: Acute subcapsular fracture of the left hip with superior migration of the left hip and femur. Electronically Signed   By: David  [place] M.D.   On: 11/10/2015 08:29   Dg Hip Operative Unilat W Or W/o Pelvis Left  11/11/2015  CLINICAL DATA:  Femoral neck fracture. Initial encounter. EXAM: OPERATIVE left HIP (WITH PELVIS IF PERFORMED) AP VIEWS TECHNIQUE: Fluoroscopic spot image(s) were submitted for interpretation post-operatively. COMPARISON:  Radiography from yesterday FINDINGS: Bipolar left hip hemiarthroplasty without evidence of periprosthetic fracture or dislocation. IMPRESSION: Fluoroscopy for left hip hemiarthroplasty.  No adverse finding. Electronically Signed   By: Marnee Spring M.D.   On: 11/11/2015 09:27    Time Spent in minutes  25   Kaulder Zahner M.D on 11/16/2015 at 8:29 AM  Pager-[phone]

## 2015-11-16 NOTE — Progress Notes (Signed)
ANTICOAGULATION CONSULT NOTE - Follow Up Consult  Pharmacy Consult for Coumadin Indication: atrial fibrillation and VTE prophylaxis s/p hip surgery on 5/6  No Known Allergies  Patient Measurements: Height: 5\' 5"  (165.1 cm) Weight: 155 lb (70.308 kg) IBW/kg (Calculated) : 57  Vital Signs: Temp: 98.3 F (36.8 C) (05/11 0318) Temp Source: Oral (05/11 0318) BP: 102/56 mmHg (05/11 1103) Pulse Rate: 72 (05/11 1103)  Labs:  Recent Labs  11/14/15 0540 11/15/15 0403 11/15/15 0404 11/16/15 0318  HGB 8.7* 8.1*  --  9.7*  HCT 26.9* 24.9*  --  29.6*  PLT 190 220  --  239  LABPROT 22.1* 22.9*  --  23.1*  INR 1.94* 2.04*  --  2.07*  HEPARINUNFRC 0.53  --  0.42  --   CREATININE 0.69  --   --  0.70    Estimated Creatinine Clearance: 46 mL/min (by C-G formula based on Cr of 0.7).  Assessment:  89 yof on warfarin pta for afib, admitted with hip fracture s/p fall. INR 2.13 on admit, reversed with Vitamin K prior to OR. Pharmacy consulted to restart warfarin 5/6 for afib, VTE ppx s/p hip surgery on 5/6. High fall risk per MD note. Heparin bridge d/c'd 5/10 when INR 2.04.   INR remains therapeutic at 2.07.   New to amiodarone on 5/9, which may increase INR.  PTA warfarin dose: 3mg  daily except 4mg  on Mondays    For transfer to inpatient Rehab today  Goal of Therapy:  INR 2-3 Monitor platelets by anticoagulation protocol: Yes   Plan:   Continue Coumadin 3 mg daily except 4 mg on Mondays.  Daily PT/INR for now.  Watch for amiodarone effect on INR.  , 10[birth year]04-1971 Pager: [phone] 11/16/2015,2:44 PM

## 2015-11-16 NOTE — H&P (Signed)
Physical Medicine and Rehabilitation Admission H&P   Chief complaint: Hip pain  HPI: Kendra Hughes is a 82 y.o. right handed female with history of hypertension, chronic atrial fibrillation maintained on Coumadin with history of DCCV, diastolic congestive heart failure, vitamin D deficiency. Patient lives with spouse independent prior to admission. She still drives. One level home with 2 steps to entry. Presented 11/10/2015 after mechanical fall and presented to [facility]. X-rays and imaging revealed left hip displaced femoral neck fracture. She was transferred to [facility] and underwent left hip bipolar hemiarthroplasty through direct anterior approach 11/11/2015 per Dr. Magnus Ivan. Hospital course pain management. Weightbearing as tolerated. Chronic Coumadin resumed with intravenous heparin for bridging. Acute blood loss anemia 9.0 and monitor. On 11/12/2015 patient with atrial fibrillation with RVR heart rate 110-130. Cardiology was consulted and placed on low dose beta blocker and Cardizem as well as intravenous amiodarone. She was slowly transitioned to PO. Mildly elevated troponin felt to be nonspecific. No plan for cardioversion at this time. Physical occupational therapy evaluation completed 11/12/2015 with recommendations of physical medicine rehabilitation consult. Patient was admitted for a comprehensive rehabilitation program  Spoke with family patient gets very tired when her heart rate gets up to around 1:30. She's also had episodes at home where her blood pressure is down to 85 and she feels like she is falling backwards. In addition she has right knee osteoarthritis.  ROS Constitutional: Negative for fever and chills.  HENT: Negative for hearing loss.  Eyes: Negative for blurred vision and double vision.  Respiratory: Negative for cough.  Cardiovascular: Positive for leg swelling. Negative for chest pain and palpitations.  Gastrointestinal: Positive for  constipation. Negative for nausea and vomiting.  Genitourinary: Negative for dysuria and hematuria.  Musculoskeletal: Positive for myalgias, joint pain and falls.  Skin: Negative for rash.  Neurological: Negative for seizures, weakness and headaches.  All other systems reviewed and are negative   Past Medical History  Diagnosis Date  . PAF (paroxysmal atrial fibrillation) (HCC)   . Hypertension   . Dyslipidemia   . DOE (dyspnea on exertion)   . History of nuclear stress test 10/2009    normal exam  . Chronic diastolic (congestive) heart failure (HCC)   . DVT (deep venous thrombosis) (HCC) "years ago"  . Fracture of left hip (HCC) 11/09/2015    "fell"  . Arthritis     "right knee" (11/10/2015)  . Rheumatoid arthritis (HCC) 1970    "it's in remission" (11/10/2015)   Past Surgical History  Procedure Laterality Date  . Abdominal hysterectomy  1996  . Breast biopsy Right 1984    "benign"  . Transthoracic echocardiogram  07/22/2012    EF 55-60%; mild AV regurg; mild MR; mild-mod TR  . Tee without cardioversion N/A 11/01/2013    Procedure: TRANSESOPHAGEAL ECHOCARDIOGRAM (TEE); Surgeon: Chrystie Nose, MD; Location[facility] ENDOSCOPY; Service: Cardiovascular; Laterality: N/A;  . Cardioversion N/A 11/01/2013    Procedure: CARDIOVERSION; Surgeon: Chrystie Nose, MD; Location: [facility] ENDOSCOPY; Service: Cardiovascular; Laterality: N/A;  . Tonsillectomy    . Cataract extraction w/ intraocular lens implant, bilateral Bilateral 2003  . Bladder suspension  X 2  . Rectocele repair    . Total hip arthroplasty Left 11/11/2015    Procedure: HEMI ARTHROPLASTY ANTERIOR APPROACH LEFT; Surgeon: Kathryne Hitch, MD; Location: [facility] OR; Service: Orthopedics; Laterality: Left;   Family History  Problem Relation Age of Onset  . Heart disease Mother    Social History:  reports  that she  has never smoked. She has never used smokeless tobacco. She reports that she does not drink alcohol or use illicit drugs. Allergies: No Known Allergies Medications Prior to Admission  Medication Sig Dispense Refill  . atorvastatin (LIPITOR) 20 MG tablet Take 20 mg by mouth daily.    . cholecalciferol (VITAMIN D) 1000 units tablet Take 1,000 Units by mouth daily.    [name] diltiazem (CARDIZEM CD) 120 MG 24 hr capsule Take 1 capsule (120 mg total) by mouth daily. 90 capsule 3  . Fexofenadine HCl (ALLEGRA PO) Take 1 tablet by mouth as needed (allergies).     . Fluticasone Propionate (FLONASE NA) Place 1 spray into the nose as needed (allergies).     . folic acid (FOLVITE) 462 MCG tablet Take 800 mcg by mouth daily.    . furosemide (LASIX) 40 MG tablet Take 1 tablet (40 mg total) by mouth daily. 30 tablet 3  . metoprolol (LOPRESSOR) 50 MG tablet Take 1 tablet (50 mg total) by mouth 2 (two) times daily. 180 tablet 3  . warfarin (COUMADIN) 1 MG tablet Take 1 mg by mouth once a week. Monday    . warfarin (COUMADIN) 3 MG tablet Take 3 mg by mouth daily.       Home: Home Living Family/patient expects to be discharged to:: Private residence Living Arrangements: Spouse/significant other Available Help at Discharge: Family, Available 24 hours/day Type of Home: House Home Access: Stairs to enter Technical brewer of Steps: 2 Entrance Stairs-Rails: Left Home Layout: One level Bathroom Shower/Tub: Multimedia programmer: Handicapped height Bathroom Accessibility: Yes Home Equipment: Grab bars - tub/shower, Shower seat - built in (Possibly a walker)  Functional History: Prior Function Level of Independence: Independent Comments: Still drives, cooking./cleaning, shopping  Functional Status:  Mobility: Bed Mobility Overal bed mobility: (nursing deferred mobility) Bed Mobility: Supine to Sit Supine to sit: Mod assist,  +2 for physical assistance General bed mobility comments: cues for technique; assist to bring legs to EOB and to lift trunk upright Transfers Overall transfer level: Needs assistance Equipment used: Rolling walker (2 wheeled) Transfers: Sit to/from Stand, [employer] Transfers Sit to Stand: Mod assist, +2 physical assistance Stand pivot transfers: Mod assist, +2 physical assistance General transfer comment: lifting help and cues for hand placement up from EOB and for balance; stand step to [facility], then to recliner with RW and mod A for balance, walker management and safety Ambulation/Gait Ambulation/Gait assistance: (deferred dut HR up to 130 with OOB to chair) General Gait Details: Unable at this time.  Gait velocity: Decreased Gait velocity interpretation: Below normal speed for age/gender    ADL: ADL Overall ADL's : Needs assistance/impaired Grooming: Moderate assistance, Sitting Upper Body Bathing: Moderate assistance, Sitting Lower Body Bathing: Maximal assistance, +2 for physical assistance, Sit to/from stand Upper Body Dressing : Moderate assistance, Sitting Upper Body Dressing Details (indicate cue type and reason): Assist for balance and donning gown. Lower Body Dressing: Maximal assistance, +2 for physical assistance, Sit to/from stand General ADL Comments: Limited eval secondary to increased HR, decreased BP, and decreased SpO2. Following sit to stand from EOB x1 pt with decreased responsiveness and returned to supine: HR=160, BP=92/54, SpO2=88% on RA.  Cognition: Cognition Overall Cognitive Status: Within Functional Limits for tasks assessed Orientation Level: Oriented X4 Cognition Arousal/Alertness: Awake/alert Behavior During Therapy: WFL for tasks assessed/performed Overall Cognitive Status: Within Functional Limits for tasks assessed  Physical Exam: Blood pressure 129/95, pulse 96, temperature 98.3 F (36.8 C), temperature source Oral, resp. rate 18,  height '5\' 5"'$   (1.651 m), weight 70.308 kg (155 lb), SpO2 94 %. Physical Exam Constitutional: She is oriented to person, place, and time. She appears well-developed and well-nourished.  HENT:  Head: Normocephalic and atraumatic.  Eyes: Conjunctivae and EOM are normal.  Neck: Normal range of motion. Neck supple. No thyromegaly present.  Cardiovascular:  Irregularly irregular  Respiratory: Effort normal and breath sounds normal. No respiratory distress.  GI: Soft. Bowel sounds are normal. She exhibits no distension.  Musculoskeletal: She exhibits edema and tenderness.  Neurological: She is alert and oriented to person, place, and time.  Sensation intact light touch Motor: Bilateral upper extremities: 4+/5 proximal to distal Right lower extremity: 4+/5 excellent to distal Left lower extremity hip flexion 2/5, ankle dorsi/plantar flexion 4+/5  Skin: Skin is warm and dry.  Hip incision clean and dry. Appropriately tender  Psychiatric: She has a normal mood and affect. Her behavior is normal    Lab Results Last 48 Hours    Results for orders placed or performed during the hospital encounter of 11/10/15 (from the past 48 hour(s))  Protime-INR Status: Abnormal   Collection Time: 11/15/15 4:03 AM  Result Value Ref Range   Prothrombin Time 22.9 (H) 11.6 - 15.2 seconds   INR 2.04 (H) 0.00 - 1.49  CBC Status: Abnormal   Collection Time: 11/15/15 4:03 AM  Result Value Ref Range   WBC 8.4 4.0 - 10.5 K/uL   RBC 2.80 (L) 3.87 - 5.11 MIL/uL   Hemoglobin 8.1 (L) 12.0 - 15.0 g/dL   HCT 24.9 (L) 36.0 - 46.0 %   MCV 88.9 78.0 - 100.0 fL   MCH 28.9 26.0 - 34.0 pg   MCHC 32.5 30.0 - 36.0 g/dL   RDW 15.4 11.5 - 15.5 %   Platelets 220 150 - 400 K/uL  Heparin level (unfractionated) Status: None   Collection Time: 11/15/15 4:04 AM  Result Value Ref Range   Heparin Unfractionated 0.42 0.30 - 0.70 IU/mL    Comment:    IF HEPARIN RESULTS ARE BELOW EXPECTED VALUES, AND PATIENT DOSAGE HAS BEEN CONFIRMED, SUGGEST FOLLOW UP TESTING OF ANTITHROMBIN III LEVELS.   Type and screen [place] Status: None   Collection Time: 11/15/15 11:45 AM  Result Value Ref Range   ABO/RH(D) O POS    Antibody Screen NEG    Sample Expiration 11/18/2015    Unit Number [id number]    Blood Component Type RBC LR PHER2    Unit division 00    Status of Unit ISSUED,FINAL    Transfusion Status OK TO TRANSFUSE    Crossmatch Result Compatible   Prepare RBC Status: None   Collection Time: 11/15/15 11:45 AM  Result Value Ref Range   Order Confirmation ORDER PROCESSED BY BLOOD BANK   Protime-INR Status: Abnormal   Collection Time: 11/16/15 3:18 AM  Result Value Ref Range   Prothrombin Time 23.1 (H) 11.6 - 15.2 seconds   INR 2.07 (H) 0.00 - 1.49  CBC Status: Abnormal   Collection Time: 11/16/15 3:18 AM  Result Value Ref Range   WBC 9.5 4.0 - 10.5 K/uL   RBC 3.33 (L) 3.87 - 5.11 MIL/uL   Hemoglobin 9.7 (L) 12.0 - 15.0 g/dL   HCT 29.6 (L) 36.0 - 46.0 %   MCV 88.9 78.0 - 100.0 fL   MCH 29.1 26.0 - 34.0 pg   MCHC 32.8 30.0 - 36.0 g/dL   RDW 15.4 11.5 - 15.5 %   Platelets 239 150 -  400 K/uL  Comprehensive metabolic panel Status: Abnormal   Collection Time: 11/16/15 3:18 AM  Result Value Ref Range   Sodium 136 135 - 145 mmol/L   Potassium 4.0 3.5 - 5.1 mmol/L   Chloride 94 (L) 101 - 111 mmol/L   CO2 33 (H) 22 - 32 mmol/L   Glucose, Bld 109 (H) 65 - 99 mg/dL   BUN 13 6 - 20 mg/dL   Creatinine, Ser 0.70 0.44 - 1.00 mg/dL   Calcium 8.6 (L) 8.9 - 10.3 mg/dL   Total Protein 6.0 (L) 6.5 - 8.1 g/dL   Albumin 2.2 (L) 3.5 - 5.0 g/dL   AST 38 15 - 41 U/L   ALT 27 14 - 54 U/L   Alkaline Phosphatase 86 38 - 126 U/L    Total Bilirubin 1.4 (H) 0.3 - 1.2 mg/dL   GFR calc non Af Amer >60 >60 mL/min   GFR calc Af Amer >60 >60 mL/min    Comment: (NOTE) The eGFR has been calculated using the CKD EPI equation. This calculation has not been validated in all clinical situations. eGFR's persistently <60 mL/min signify possible Chronic Kidney Disease.    Anion gap 9 5 - 15  Magnesium Status: None   Collection Time: 11/16/15 8:26 AM  Result Value Ref Range   Magnesium 1.9 1.7 - 2.4 mg/dL      Imaging Results (Last 48 hours)    No results found.       Medical Problem List and Plan: 1. Debilitation with immobility secondary to left displaced femoral neck fracture status post left hip bipolar hemiarthroplasty direct anterior approach 11/11/2014. Weightbearing as tolerated 2. DVT Prophylaxis/Anticoagulation: Chronic Coumadin. Monitor for rebleeding episodes 3. Pain Management: Hydrocodone and Robaxin as needed. Monitor mental status 4. Acute blood loss anemia. Follow-up CBC 5. Neuropsych: This patient is capable of making decisions on his own behalf. 6. Skin/Wound Care: Routine skin checks 7. Fluids/Electrolytes/Nutrition: Routine I&O with follow-up chemistries 8. Atrial fibrillation. amiodarone 4 mg twice a day, Cardizem CD 120 mg daily. Cardiac rate control. Follow-up cardiology services as needed 9 Diastolic congestive heart failure. Lasix 20 mg daily. Weigh patient daily. Monitor for any signs of fluid overload 10. Hyperlipidemia. Lipitor 11. Constipation. Laxative assistance   Post Admission Physician Evaluation: 1. Functional deficits secondary to  left displaced femoral neck fracture status post left hip bipolar hemiarthroplasty direct anterior approach 11/11/2014. 2. Patient is admitted to receive collaborative, interdisciplinary care between the physiatrist, rehab nursing staff, and therapy team. 3. Patient's level of medical complexity and substantial therapy  needs in context of that medical necessity cannot be provided at a lesser intensity of care such as a SNF. 4. Patient has experienced substantial functional loss from his/her baseline which was documented above under the "Functional History" and "Functional Status" headings. Judging by the patient's diagnosis, physical exam, and functional history, the patient has potential for functional progress which will result in measurable gains while on inpatient rehab. These gains will be of substantial and practical use upon discharge in facilitating mobility and self-care at the household level. 5. Physiatrist will provide 24 hour management of medical needs as well as oversight of the therapy plan/treatment and provide guidance as appropriate regarding the interaction of the two. 6. 24 hour rehab nursing will assist with bladder management, bowel management, safety, skin/wound care, disease management, medication administration, pain management and patient education and help integrate therapy concepts, techniques,education, etc. 7. PT will assess and treat for/with: pre gait, gait training, endurance ,  safety, equipment, neuromuscular re education. Goals are: Supervision. 8. OT will assess and treat for/with: ADLs, Cognitive perceptual skills, Neuromuscular re education, safety, endurance, equipment. Goals are: Supervision. Therapy May not proceed with showering this patient. 9. SLP will assess and treat for/with: NA. Goals are: NA. 10. Case Management and Social Worker will assess and treat for psychological issues and discharge planning. 11. Team conference will be held weekly to assess progress toward goals and to determine barriers to discharge. 12. Patient will receive at least 3 hours of therapy per day at least 5 days per week. 13. ELOS: 10-12 days  14. Prognosis: good     Charlett Blake M.D. [facility] FAAPM&R (Sports Med, Neuromuscular Med) Diplomate Am Board  of Electrodiagnostic Med  11/16/2015

## 2015-11-16 NOTE — Progress Notes (Signed)
Rehab admissions - I have medical clearance for acute inpatient rehab admission.  Patient has decided that she wants to admit to acute inpatient rehab. Bed available and will admit to inpatient rehab today.  Call me for questions.  #[phone]

## 2015-11-16 NOTE — Clinical Social Work Note (Signed)
CSW informed by [facility] that patient is going to CIR. CSW signing off.   [employer], LCSW [phone]

## 2015-11-16 NOTE — Progress Notes (Signed)
Occupational Therapy Treatment Patient Details Name: Kendra Hughes MRN: [id number] DOB: 1922/11/17 Today's Date: 11/16/2015    History of present illness Pt is a 82 y/o female who sustained a displaced L femoral neck fracture after a mechanical fall on 11/09/15. Pt is now s/p L hip hemiarthroplasty and has no precautions.    OT comments  Pt is making good progress. Pt able to tolerate more activity today.  Needs +2 for sit to stand  Follow Up Recommendations  CIR    Equipment Recommendations  3 in 1 bedside comode    Recommendations for Other Services      Precautions / Restrictions Precautions Precautions: Fall Restrictions Weight Bearing Restrictions: Yes LLE Weight Bearing: Weight bearing as tolerated       Mobility Bed Mobility   Bed Mobility: Supine to Sit     Supine to sit: Min assist     General bed mobility comments: use of rail.  Cues for sequence  Transfers Overall transfer level: Needs assistance Equipment used: Rolling walker (2 wheeled) Transfers: Sit to/from Stand Sit to Stand: Min assist;Mod assist;+2 physical assistance (min from high commode; mod from bed and chair)         General transfer comment: assist to rise and stabilize.  cues for UE placement    Balance                                   ADL                           Toilet Transfer: Ambulation;BSC;RW;Minimal assistance (from high surface commode; mod from bed and regular chair)             General ADL Comments: ambulated to chair in room and rested.  Plan was to stand at sink for grooming, but pt needed to use commode so performed SPT to this.  Pt returned to chair to complete grooming task.  HR 90-126.  Educated on and practiced with AE while taking a seated rest.  Used Insurance claims handler   Behavior During Therapy: WFL for tasks assessed/performed Overall Cognitive  Status: Within Functional Limits for tasks assessed                       Extremity/Trunk Assessment               Exercises     Shoulder Instructions       General Comments      Pertinent Vitals/ Pain       Pain Assessment: No/denies pain  Home Living Family/patient expects to be discharged to:: Inpatient rehab                                        Prior Functioning/Environment              Frequency Min 2X/week     Progress Toward Goals  OT Goals(current goals can now be found in the care plan section)  Progress towards OT goals: Progressing toward goals  Plan Discharge plan remains appropriate    Co-evaluation    PT/OT/SLP Co-Evaluation/Treatment: Yes Reason for Co-Treatment: For patient/therapist safety PT goals addressed during session: Mobility/safety with mobility OT goals addressed during session: ADL's and self-care      End of Session     Activity Tolerance Patient tolerated treatment well   Patient Left in chair;with call bell/phone within reach;with chair alarm set;with family/visitor present   Nurse Communication          Time: 2633-3545 OT Time Calculation (min): 32 min  Charges: OT General Charges $OT Visit: 1 Procedure OT Treatments $Self Care/Home Management : 8-22 mins  Keegan Bensch 11/16/2015, 12:02 PM  Marica Otter, OTR/L [phone] 11/16/2015

## 2015-11-17 ENCOUNTER — Inpatient Hospital Stay (HOSPITAL_COMMUNITY): Payer: Medicare Other | Admitting: Occupational Therapy

## 2015-11-17 ENCOUNTER — Inpatient Hospital Stay (HOSPITAL_COMMUNITY): Payer: Medicare Other

## 2015-11-17 ENCOUNTER — Inpatient Hospital Stay (HOSPITAL_COMMUNITY): Payer: Medicare Other | Admitting: Physical Therapy

## 2015-11-17 DIAGNOSIS — R32 Unspecified urinary incontinence: Secondary | ICD-10-CM | POA: Insufficient documentation

## 2015-11-17 DIAGNOSIS — Z9660 Presence of unspecified orthopedic joint implant: Secondary | ICD-10-CM

## 2015-11-17 DIAGNOSIS — D62 Acute posthemorrhagic anemia: Secondary | ICD-10-CM

## 2015-11-17 DIAGNOSIS — S72002S Fracture of unspecified part of neck of left femur, sequela: Secondary | ICD-10-CM

## 2015-11-17 DIAGNOSIS — R339 Retention of urine, unspecified: Secondary | ICD-10-CM

## 2015-11-17 DIAGNOSIS — G4700 Insomnia, unspecified: Secondary | ICD-10-CM

## 2015-11-17 DIAGNOSIS — E46 Unspecified protein-calorie malnutrition: Secondary | ICD-10-CM

## 2015-11-17 DIAGNOSIS — Z96649 Presence of unspecified artificial hip joint: Secondary | ICD-10-CM | POA: Insufficient documentation

## 2015-11-17 DIAGNOSIS — E8809 Other disorders of plasma-protein metabolism, not elsewhere classified: Secondary | ICD-10-CM | POA: Insufficient documentation

## 2015-11-17 DIAGNOSIS — S72009S Fracture of unspecified part of neck of unspecified femur, sequela: Secondary | ICD-10-CM

## 2015-11-17 DIAGNOSIS — M1710 Unilateral primary osteoarthritis, unspecified knee: Secondary | ICD-10-CM

## 2015-11-17 LAB — URINALYSIS, ROUTINE W REFLEX MICROSCOPIC
BILIRUBIN URINE: NEGATIVE
GLUCOSE, UA: NEGATIVE mg/dL
HGB URINE DIPSTICK: NEGATIVE
Ketones, ur: NEGATIVE mg/dL
Leukocytes, UA: NEGATIVE
Nitrite: NEGATIVE
PROTEIN: NEGATIVE mg/dL
SPECIFIC GRAVITY, URINE: 1.009 (ref 1.005–1.030)
pH: 7.5 (ref 5.0–8.0)

## 2015-11-17 LAB — CBC WITH DIFFERENTIAL/PLATELET
Basophils Absolute: 0 10*3/uL (ref 0.0–0.1)
Basophils Relative: 0 %
Eosinophils Absolute: 0.2 10*3/uL (ref 0.0–0.7)
Eosinophils Relative: 2 %
HEMATOCRIT: 29.4 % — AB (ref 36.0–46.0)
HEMOGLOBIN: 9.7 g/dL — AB (ref 12.0–15.0)
LYMPHS ABS: 0.8 10*3/uL (ref 0.7–4.0)
Lymphocytes Relative: 9 %
MCH: 29.1 pg (ref 26.0–34.0)
MCHC: 33 g/dL (ref 30.0–36.0)
MCV: 88.3 fL (ref 78.0–100.0)
Monocytes Absolute: 1.1 10*3/uL — ABNORMAL HIGH (ref 0.1–1.0)
Monocytes Relative: 12 %
NEUTROS ABS: 6.7 10*3/uL (ref 1.7–7.7)
NEUTROS PCT: 77 %
Platelets: 254 10*3/uL (ref 150–400)
RBC: 3.33 MIL/uL — ABNORMAL LOW (ref 3.87–5.11)
RDW: 15 % (ref 11.5–15.5)
WBC: 8.7 10*3/uL (ref 4.0–10.5)

## 2015-11-17 LAB — COMPREHENSIVE METABOLIC PANEL
ALK PHOS: 84 U/L (ref 38–126)
ALT: 30 U/L (ref 14–54)
ANION GAP: 12 (ref 5–15)
AST: 41 U/L (ref 15–41)
Albumin: 2.2 g/dL — ABNORMAL LOW (ref 3.5–5.0)
BILIRUBIN TOTAL: 1.7 mg/dL — AB (ref 0.3–1.2)
BUN: 11 mg/dL (ref 6–20)
CALCIUM: 8.7 mg/dL — AB (ref 8.9–10.3)
CO2: 30 mmol/L (ref 22–32)
Chloride: 94 mmol/L — ABNORMAL LOW (ref 101–111)
Creatinine, Ser: 0.67 mg/dL (ref 0.44–1.00)
GFR calc non Af Amer: >60 mL/min (ref >60)
Glucose, Bld: 103 mg/dL — ABNORMAL HIGH (ref 65–99)
Potassium: 3.9 mmol/L (ref 3.5–5.1)
Sodium: 136 mmol/L (ref 135–145)
TOTAL PROTEIN: 6 g/dL — AB (ref 6.5–8.1)

## 2015-11-17 LAB — PROTIME-INR
INR: 2.13 — AB (ref 0.00–1.49)
Prothrombin Time: 23.7 seconds — ABNORMAL HIGH (ref 11.6–15.2)

## 2015-11-17 MED ORDER — TRAZODONE HCL 50 MG PO TABS
25.0000 mg | ORAL_TABLET | Freq: Every evening | ORAL | Status: DC | PRN
  Administered 2015-11-17 – 2015-11-18 (×2): 25 mg via ORAL
  Filled 2015-11-17 (×2): qty 1

## 2015-11-17 MED ORDER — DILTIAZEM HCL ER COATED BEADS 180 MG PO CP24
180.0000 mg | ORAL_CAPSULE | Freq: Every day | ORAL | Status: DC
  Administered 2015-11-18 – 2015-11-25 (×8): 180 mg via ORAL
  Filled 2015-11-17 (×9): qty 1

## 2015-11-17 MED ORDER — BENEPROTEIN PO POWD
1.0000 | Freq: Three times a day (TID) | ORAL | Status: DC
  Administered 2015-11-17 – 2015-11-24 (×21): 6 g via ORAL
  Filled 2015-11-17: qty 227

## 2015-11-17 NOTE — Progress Notes (Signed)
SUBJECTIVE:  No SOB. No distress.  She apparently had urinary retention yesterday.    PHYSICAL EXAM Filed Vitals:   11/16/15 1700 11/17/15 0500  BP: 129/59 110/60  Pulse: 110 96  Temp: 98.1 F (36.7 C) 98.1 F (36.7 C)  TempSrc: Oral Oral  Resp: 18 18  Height: 5\' 5"  (1.651 m)   Weight: 163 lb 12.8 oz (74.3 kg) 165 lb 5.5 oz (75 kg)  SpO2: 93% 95%   General:  No acute distress Lungs:  Few dependent crackles Heart:  Irregular Abdomen:  Positive bowel sounds, no rebound no guarding Extremities:  No edema  Neuro:  Nonfocal  LABS: Lab Results  Component Value Date   TROPONINI 0.05* 11/13/2015   Results for orders placed or performed during the hospital encounter of 11/16/15 (from the past 24 hour(s))  CBC WITH DIFFERENTIAL     Status: Abnormal   Collection Time: 11/17/15  5:08 AM  Result Value Ref Range   WBC 8.7 4.0 - 10.5 K/uL   RBC 3.33 (L) 3.87 - 5.11 MIL/uL   Hemoglobin 9.7 (L) 12.0 - 15.0 g/dL   HCT 01/17/16 (L) 05.6 - 97.9 %   MCV 88.3 78.0 - 100.0 fL   MCH 29.1 26.0 - 34.0 pg   MCHC 33.0 30.0 - 36.0 g/dL   RDW 48.0 16.5 - 53.7 %   Platelets 254 150 - 400 K/uL   Neutrophils Relative % 77 %   Neutro Abs 6.7 1.7 - 7.7 K/uL   Lymphocytes Relative 9 %   Lymphs Abs 0.8 0.7 - 4.0 K/uL   Monocytes Relative 12 %   Monocytes Absolute 1.1 (H) 0.1 - 1.0 K/uL   Eosinophils Relative 2 %   Eosinophils Absolute 0.2 0.0 - 0.7 K/uL   Basophils Relative 0 %   Basophils Absolute 0.0 0.0 - 0.1 K/uL  Comprehensive metabolic panel     Status: Abnormal   Collection Time: 11/17/15  5:08 AM  Result Value Ref Range   Sodium 136 135 - 145 mmol/L   Potassium 3.9 3.5 - 5.1 mmol/L   Chloride 94 (L) 101 - 111 mmol/L   CO2 30 22 - 32 mmol/L   Glucose, Bld 103 (H) 65 - 99 mg/dL   BUN 11 6 - 20 mg/dL   Creatinine, Ser 01/17/16 0.44 - 1.00 mg/dL   Calcium 8.7 (L) 8.9 - 10.3 mg/dL   Total Protein 6.0 (L) 6.5 - 8.1 g/dL   Albumin 2.2 (L) 3.5 - 5.0 g/dL   AST 41 15 - 41 U/L   ALT 30 14 -  54 U/L   Alkaline Phosphatase 84 38 - 126 U/L   Total Bilirubin 1.7 (H) 0.3 - 1.2 mg/dL   GFR calc non Af Amer >60 >60 mL/min   GFR calc Af Amer >60 >60 mL/min   Anion gap 12 5 - 15  Protime-INR     Status: Abnormal   Collection Time: 11/17/15  5:08 AM  Result Value Ref Range   Prothrombin Time 23.7 (H) 11.6 - 15.2 seconds   INR 2.13 (H) 0.00 - 1.49    Intake/Output Summary (Last 24 hours) at 11/17/15 0829 Last data filed at 11/17/15 0800  Gross per 24 hour  Intake    120 ml  Output   2232 ml  Net  -2112 ml    ASSESSMENT AND PLAN:  ATRIAL FIB WITH RVR:   Her rate is slightly high .   She can have elective cardioversion in a few  weeks if she is still in atrial fib.      I will increase the Cardizem.   MILDLY ELEVATED TROPONIN:  Nonspecific.  Not a NSTEMI.   HeFPEF:    Euvolemic.   Fayrene Fearing [facility] 11/17/2015 8:29 AM

## 2015-11-17 NOTE — Discharge Instructions (Addendum)
Information on my medicine - Coumadin®   (Warfarin) ° °This medication education was reviewed with me or my healthcare representative as part of my discharge preparation.   ° °Why was Coumadin prescribed for you? °Coumadin was prescribed for you because you have a blood clot or a medical condition that can cause an increased risk of forming blood clots. Blood clots can cause serious health problems by blocking the flow of blood to the heart, lung, or brain. Coumadin can prevent harmful blood clots from forming. °As a reminder your indication for Coumadin is:   Stroke Prevention Because Of Atrial Fibrillation ° °What test will check on my response to Coumadin? °While on Coumadin (warfarin) you will need to have an INR test regularly to ensure that your dose is keeping you in the desired range. The INR (international normalized ratio) number is calculated from the result of the laboratory test called prothrombin time (PT). ° °If an INR APPOINTMENT HAS NOT ALREADY BEEN MADE FOR YOU please schedule an appointment to have this lab work done by your health care provider within 7 days. °Your INR goal is usually a number between:  2 to 3 or your provider may give you a more narrow range like 2-2.5.  Ask your health care provider during an office visit what your goal INR is. ° °What  do you need to  know  About  COUMADIN? °Take Coumadin (warfarin) exactly as prescribed by your healthcare provider about the same time each day.  DO NOT stop taking without talking to the doctor who prescribed the medication.  Stopping without other blood clot prevention medication to take the place of Coumadin may increase your risk of developing a new clot or stroke.  Get refills before you run out. ° °What do you do if you miss a dose? °If you miss a dose, take it as soon as you remember on the same day then continue your regularly scheduled regimen the next day.  Do not take two doses of Coumadin at the same time. ° °Important Safety  Information °A possible side effect of Coumadin (Warfarin) is an increased risk of bleeding. You should call your healthcare provider right away if you experience any of the following: °? Bleeding from an injury or your nose that does not stop. °? Unusual colored urine (red or dark brown) or unusual colored stools (red or black). °? Unusual bruising for unknown reasons. °? A serious fall or if you hit your head (even if there is no bleeding). ° °Some foods or medicines interact with Coumadin® (warfarin) and might alter your response to warfarin. To help avoid this: °? Eat a balanced diet, maintaining a consistent amount of Vitamin K. °? Notify your provider about major diet changes you plan to make. °? Avoid alcohol or limit your intake to 1 drink for women and 2 drinks for men per day. °(1 drink is 5 oz. wine, 12 oz. beer, or 1.5 oz. liquor.) ° °Make sure that ANY health care provider who prescribes medication for you knows that you are taking Coumadin (warfarin).  Also make sure the healthcare provider who is monitoring your Coumadin knows when you have started a new medication including herbals and non-prescription products. ° °Coumadin® (Warfarin)  Major Drug Interactions  °Increased Warfarin Effect Decreased Warfarin Effect  °Alcohol (large quantities) °Antibiotics (esp. Septra/Bactrim, Flagyl, Cipro) °Amiodarone (Cordarone) °Aspirin (ASA) °Cimetidine (Tagamet) °Megestrol (Megace) °NSAIDs (ibuprofen, naproxen, etc.) °Piroxicam (Feldene) °Propafenone (Rythmol SR) °Propranolol (Inderal) °Isoniazid (INH) °Posaconazole (Noxafil) Barbiturates (Phenobarbital) °  Carbamazepine (Tegretol) Chlordiazepoxide (Librium) Cholestyramine (Questran) Griseofulvin Oral Contraceptives Rifampin Sucralfate (Carafate) Vitamin K   Coumadin (Warfarin) Major Herbal Interactions  Increased Warfarin Effect Decreased Warfarin Effect  Garlic Ginseng Ginkgo biloba Coenzyme Q10 Green tea St. Johns wort    Coumadin (Warfarin)  FOOD Interactions  Eat a consistent number of servings per week of foods HIGH in Vitamin K (1 serving =  cup)  Collards (cooked, or boiled & drained) Kale (cooked, or boiled & drained) Mustard greens (cooked, or boiled & drained) Parsley *serving size only =  cup Spinach (cooked, or boiled & drained) Swiss chard (cooked, or boiled & drained) Turnip greens (cooked, or boiled & drained)  Eat a consistent number of servings per week of foods MEDIUM-HIGH in Vitamin K (1 serving = 1 cup)  Asparagus (cooked, or boiled & drained) Broccoli (cooked, boiled & drained, or raw & chopped) Brussel sprouts (cooked, or boiled & drained) *serving size only =  cup Lettuce, raw (green leaf, endive, romaine) Spinach, raw Turnip greens, raw & chopped   These websites have more information on Coumadin (warfarin):  http://www.king-russell.com/; https://www.hines.net/;  Inpatient Rehab Discharge Instructions  Kendra Hughes Discharge date and time: No discharge date for patient encounter.   Activities/Precautions/ Functional Status: Activity: activity as tolerated Diet: regular diet Wound Care: keep wound clean and dry Functional status:  ___ No restrictions     ___ Walk up steps independently ___ 24/7 supervision/assistance   ___ Walk up steps with assistance ___ Intermittent supervision/assistance  ___ Bathe/dress independently ___ Walk with walker     _x__ Bathe/dress with assistance ___ Walk Independently    ___ Shower independently ___ Walk with assistance    ___ Shower with assistance ___ No alcohol     ___ Return to work/school ________  Special Instructions: Home health nurse to check INR on 11/28/2015 results to [facility] phone number [phone] fax number [phone]  COMMUNITY REFERRALS UPON DISCHARGE:    Home Health:   PT & RN  Agency:[facility] Phone:[phone]   Date of last service:11/25/2015  Medical Equipment/Items Ordered:WHEELCHAIR, Levan Hurst & BEDSIDE  COMMODE  Agency/Supplier:[facility]   [phone]  My questions have been answered and I understand these instructions. I will adhere to these goals and the provided educational materials after my discharge from the hospital.  Patient/Caregiver Signature _______________________________ Date __________  Clinician Signature _______________________________________ Date __________  Please bring this form and your medication list with you to all your follow-up doctor's appointments.

## 2015-11-17 NOTE — Progress Notes (Signed)
Patient information reviewed and entered into eRehab system by Constantinos Krempasky, RN, CRRN, PPS Coordinator.  Information including medical coding and functional independence measure will be reviewed and updated through discharge.     Per nursing patient was given "Data Collection Information Summary for Patients in Inpatient Rehabilitation Facilities with attached "Privacy Act Statement-Health Care Records" upon admission.  

## 2015-11-17 NOTE — Progress Notes (Addendum)
[facility]IC[facility]ABILITATION     PROGRESS NOTE  Subjective/Complaints:  Pt sitting up in bed this AM.  She states she could not sleep well overnight.  She also notes fluid build up and the need for being cathed.   ROS: +Urinary retention, insomnia.  Denies CP, SOB, N/V/D.  Objective: Vital Signs: Blood pressure 110/60, pulse 96, temperature 98.1 F (36.7 C), temperature source Oral, resp. rate 18, height 5\' 5"  (1.651 m), weight 75 kg (165 lb 5.5 oz), SpO2 95 %. No results found.  Recent Labs  11/16/15 0318 11/17/15 0508  WBC 9.5 8.7  HGB 9.7* 9.7*  HCT 29.6* 29.4*  PLT 239 254    Recent Labs  11/16/15 0318 11/17/15 0508  NA 136 136  K 4.0 3.9  CL 94* 94*  GLUCOSE 109* 103*  BUN 13 11  CREATININE 0.70 0.67  CALCIUM 8.6* 8.7*   CBG (last 3)  No results for input(s): GLUCAP in the last 72 hours.  Wt Readings from Last 3 Encounters:  11/17/15 75 kg (165 lb 5.5 oz)  11/10/15 70.308 kg (155 lb)  08/21/15 70.67 kg (155 lb 12.8 oz)    Physical Exam:  BP 110/60 mmHg  Pulse 96  Temp(Src) 98.1 F (36.7 C) (Oral)  Resp 18  Ht 5\' 5"  (1.651 m)  Wt 75 kg (165 lb 5.5 oz)  BMI 27.51 kg/m2  SpO2 95% Constitutional: She appears well-developed and well-nourished. NAD. HENT: Normocephalic and atraumatic.  Eyes: Conjunctivae and EOM are normal.  Cardiovascular: Irregularly irregular  Respiratory: Effort normal and breath sounds normal. No respiratory distress.  GI: Soft. Bowel sounds are normal. She exhibits no distension.  Musculoskeletal: She exhibits edema and tenderness.  Neurological: She is alert and oriented.  Motor: Bilateral upper extremities: 4+/5 proximal to distal Right lower extremity: 4+/5 excellent to distal Left lower extremity hip flexion 2/5, ankle dorsi/plantar flexion 4+/5  Skin: Skin is warm and dry. Hip incision with dressing c/d/i. Psychiatric: She has a normal mood and affect. Her behavior is normal  Assessment/Plan: 1.  Functional deficits secondary to left displaced femoral neck fracture status post left hip bipolar hemiarthroplasty which require 3+ hours per day of interdisciplinary therapy in a comprehensive inpatient rehab setting. Physiatrist is providing close team supervision and 24 hour management of active medical problems listed below. Physiatrist and rehab team continue to assess barriers to discharge/monitor patient progress toward functional and medical goals.  Function:  Bathing Bathing position      Bathing parts      Bathing assist        Upper Body Dressing/Undressing Upper body dressing                    Upper body assist        Lower Body Dressing/Undressing Lower body dressing                                  Lower body assist        Toileting Toileting Toileting activity did not occur: No continent bowel/bladder event Toileting steps completed by patient: Performs perineal hygiene Toileting steps completed by helper: Adjust clothing prior to toileting Toileting Assistive Devices: Other (comment) (Rolling walker)  Toileting assist Assist level: Touching or steadying assistance (Pt.75%)   Transfers Chair/bed transfer             Locomotion Ambulation  Wheelchair          Cognition Comprehension    Expression    Social Interaction    Problem Solving    Memory      Medical Problem List and Plan: 1. Debilitation with immobility secondary to left displaced femoral neck fracture status post left hip bipolar hemiarthroplasty direct anterior approach 11/11/2014.   Weightbearing as tolerated  Begin CIR 2. DVT Prophylaxis/Anticoagulation: Chronic Coumadin. Monitor for rebleeding episodes 3. Pain Management: Hydrocodone and Robaxin as needed. Monitor mental status 4. Acute blood loss anemia.   Hb 9.7 on 5/12 (stable)  Will cont to monitor 5. Neuropsych: This patient is capable of making decisions on his own behalf. 6.  Skin/Wound Care: Routine skin checks  Per surgery, hip dressing no to be removed or changed until follow up with surgery (~5/20) 7. Fluids/Electrolytes/Nutrition: Routine I&O   BMP within acceptable range on 5/12, will cont to monitor 8. Atrial fibrillation.   Amiodarone 4 mg twice a day  Cardizem CD 120 mg daily, increased to 180 by cardiology  If still in Afib in few weeks, plan for cardioversion  Follow-up cardiology services as needed 9 Diastolic congestive heart failure.   Lasix 20 mg daily.    Weigh patient daily.   Monitor for any signs of fluid overload 10. Hyperlipidemia. Lipitor 11. Constipation. Laxative assistance 12. Hypoalbuminemia  Will start supplement on 5/12 13. GU  Urinary retention and incontinence reported  UA/Ucx ordered 14. Insomnia  Low dose sleep aid started PRN 5/12  LOS (Days) 1 A FACE TO FACE EVALUATION WAS PERFORMED  Ankit Karis Juba 11/17/2015 8:48 AM

## 2015-11-17 NOTE — Progress Notes (Signed)
Social Work  Social Work Assessment and Plan  Patient Details  Name: Kendra Hughes MRN: [id number] Date of Birth: September 30, 1922  Today's Date: 11/17/2015  Problem List:  Patient Active Problem List   Diagnosis Date Noted  . S/P hip hemiarthroplasty   . Hypoalbuminemia due to protein-calorie malnutrition (HCC)   . Urinary retention   . Absence of bladder continence   . Insomnia   . Femoral neck fracture (HCC) 11/16/2015  . Atrial fibrillation with rapid ventricular response (HCC)   . Primary osteoarthritis of right knee   . Atrial fibrillation with RVR (HCC)   . Fracture of femoral neck, left (HCC)   . Tachycardia   . Acute blood loss anemia   . Postoperative pain   . Postoperative hypotension   . Vitamin D deficiency   . Hyponatremia   . Status post hip hemiarthroplasty   . Closed left hip fracture (HCC) 11/10/2015  . Chronic diastolic (congestive) heart failure (HCC)   . Hypotension 08/26/2013  . Mild AI, MR, mod TR by TEE 08/23/13 08/26/2013  . Persistent atrial fibrillation (HCC) 02/12/2013  . HTN (hypertension) 02/12/2013  . Hyperlipidemia 02/12/2013  . DOE (dyspnea on exertion) 02/12/2013   Past Medical History:  Past Medical History  Diagnosis Date  . PAF (paroxysmal atrial fibrillation) (HCC)   . Hypertension   . Dyslipidemia   . DOE (dyspnea on exertion)   . History of nuclear stress test 10/2009    normal exam  . Chronic diastolic (congestive) heart failure (HCC)   . DVT (deep venous thrombosis) (HCC) "years ago"  . Fracture of left hip (HCC) 11/09/2015    "fell"  . Arthritis     "right knee" (11/10/2015)  . Rheumatoid arthritis (HCC) 1970    "it's in remission" (11/10/2015)   Past Surgical History:  Past Surgical History  Procedure Laterality Date  . Abdominal hysterectomy  1996  . Breast biopsy Right 1984    "benign"  . Transthoracic echocardiogram  07/22/2012    EF 55-60%; mild AV regurg; mild MR; mild-mod TR  . Tee without cardioversion N/A 11/01/2013   Procedure: TRANSESOPHAGEAL ECHOCARDIOGRAM (TEE);  Surgeon: Chrystie Nose, MD;  Location: [facility] ENDOSCOPY;  Service: Cardiovascular;  Laterality: N/A;  . Cardioversion N/A 11/01/2013    Procedure: CARDIOVERSION;  Surgeon: Chrystie Nose, MD;  Location: [facility] ENDOSCOPY;  Service: Cardiovascular;  Laterality: N/A;  . Tonsillectomy    . Cataract extraction w/ intraocular lens  implant, bilateral Bilateral 2003  . Bladder suspension  X 2  . Rectocele repair    . Total hip arthroplasty Left 11/11/2015    Procedure:  HEMI ARTHROPLASTY ANTERIOR APPROACH LEFT;  Surgeon: Kathryne Hitch, MD;  Location: [facility] OR;  Service: Orthopedics;  Laterality: Left;   Social History:  reports that she has never smoked. She has never used smokeless tobacco. She reports that she does not drink alcohol or use illicit drugs.  Family / Support Systems Marital Status: Married Patient Roles: Spouse, Parent Spouse/Significant Other: Thurmond Butts [phone] Children: Phillip-son [phone] Other Supports: Another son and friends Anticipated Caregiver: Husband Ability/Limitations of Caregiver: husband fairly healthy and moves well Caregiver Availability: 24/7 Family Dynamics: Pt is very independent and prides herself on this. She reports she was still driving and was not using a cane or walker before this. Son reports she is stubborn and will do whatever is needed to be independent again. Husband is independent also.  Social History Preferred language: English Religion: Protestant Cultural Background: No issues Education: High  School Read: Yes Write: Yes Employment Status: Retired Fish farm manager Issues: No issues Guardian/Conservator: None-according to MD pt is capable of making her own decisions while here.   Abuse/Neglect Physical Abuse: Denies Verbal Abuse: Denies Sexual Abuse: Denies Exploitation of patient/patient's resources: Denies Self-Neglect: Denies  Emotional Status Pt's  affect, behavior adn adjustment status: Pt is motivated to improve she reports her hip is not bothering her but her BP has been an issue. MD is monitoring and watching her cardiac issues also. Pt is glad to be here and is ready to get out of this bed, since she is not used to this. Son and husband report she is very independent. Recent Psychosocial Issues: other health issues-cardiac and BP issues Pyschiatric History: No history deferred depression screen due to coping appropriately and in a good place. She reports: " When you get my age you are prepared for anything." Substance Abuse History: No issues  Patient / Family Perceptions, Expectations & Goals Pt/Family understanding of illness & functional limitations: Pt and family have a good understanding of her hip fracture and the treatment plan needed. She talks with the MD's who come to see her and feel her questions and concerns are being addressed. Premorbid pt/family roles/activities: Wife, Mother, grandmother, retiree, church member, etc Anticipated changes in roles/activities/participation: resume Pt/family expectations/goals: Pt states: " I want to be able to do for myself by the time I leave here."  Husband states: " She will do waht she can for herself, she is stubborn that way."  Son states: " My Mom will do it and push herself."  [employer]: None Premorbid Home Care/DME Agencies: None Transportation available at discharge: Family  Discharge Planning Living Arrangements: Spouse/significant other Support Systems: Spouse/significant other, Children, Manufacturing engineer, Psychologist, clinical community Type of Residence: Private residence Community education officer Resources: [employer], Media planner (specify) Building services engineer) Financial Resources: Restaurant manager, fast food Screen Referred: No Living Expenses: Own Money Management: Spouse, Patient Does the patient have any problems obtaining your medications?: No Home Management: Both she  and husband Patient/Family Preliminary Plans: Return home with husband who can assist if needed. Pt does not want to need assistance, she feels she can be mobile at home and now is willing to use a walker, where before she was not felt it was not necessary. One of their son's is local and can check frequnetly on them.  Aware can come at any time and will need to do family education prior to discharge. Social Work Anticipated Follow Up Needs: HH/OP  Clinical Impression Pleasant active 82 yo patient who looks younger than her stated age. Prides herself on her independence and wants to get back to this level. Her husband and son are supportive and willing to assist. She is ready to work and get out of bed, she doesn't usually lay around. Aware team conference Wed and will work on discharge plans. Await team's evaluations today.  Lucy Chris 11/17/2015, 10:11 AM

## 2015-11-17 NOTE — Progress Notes (Signed)
Occupational Therapy Session Note  Patient Details  Name: Kendra Hughes MRN: [id number] Date of Birth: April 08, 1923  Today's Date: 11/17/2015 OT Individual Time: 1300-1400 OT Individual Time Calculation (min): 60 min    Short Term Goals: Week 1:  OT Short Term Goal 1 (Week 1): STG=LTG d/t short anticipated LOS  Skilled Therapeutic Interventions/Progress Updates:  Pt seen for OT session focusing on ADL re-training, sit <> stand, and functional standing balance and endurance. Pt sitting up in recliner upon arrival, voicing need for toileting task. She required mod A to stand from recliner with VCs for hand placement on RW and technique. She ambulated with min A to bathroom and completed toileting task with assist for clothing management (hospital gown).  In therapy gym, completed sit <> stands from elevating therapy mat. Pt stood from elevated therapy mat with min-mod A. Pt progressing to completing  with close supervision- min A from therapy mat placed at lowest setting. VCs provided for "rocking" technique and "nose over toes".  She completed functional reaching task standing at [facility], required to place horseshoes on overhead basketball rim. Pt initially very fearful to let go of RW, however, able to complete task with light steadying assist. Completed x3 trials with pt turning and bending down slightly to return horseshoe from rim. She completed horseshoe toss in standing focusing on dynamic standing balance, able to complete with min A. She required verbal and tactile cuing for RW management when turning and sitting throughout task. Pt returned to room at end of session and returned to recliner. Pt left sitting in recliner with all needs in reach.   Therapy Documentation Precautions:  Precautions Precautions: Fall Restrictions Weight Bearing Restrictions: Yes LLE Weight Bearing: Weight bearing as tolerated Pain: Pain Assessment Pain Assessment: No/denies pain ADL: ADL ADL Comments: See  Functional Assessment Tool  See Function Navigator for Current Functional Status.   Therapy/Group: Individual Therapy  Lewis, Jenita Rayfield C 11/17/2015, 2:20 PM

## 2015-11-17 NOTE — Care Management Note (Signed)
Inpatient Rehabilitation Center Individual Statement of Services  Patient Name:  Kendra Hughes  Date:  11/17/2015  Welcome to the Inpatient Rehabilitation Center.  Our goal is to provide you with an individualized program based on your diagnosis and situation, designed to meet your specific needs.  With this comprehensive rehabilitation program, you will be expected to participate in at least 3 hours of rehabilitation therapies Monday-Friday, with modified therapy programming on the weekends.  Your rehabilitation program will include the following services:  Physical Therapy (PT), Occupational Therapy (OT), 24 hour per day rehabilitation nursing, Therapeutic Recreaction (TR), Case Management (Social Worker), Rehabilitation Medicine, Nutrition Services and Pharmacy Services  Weekly team conferences will be held on Wednesday to discuss your progress.  Your Social Worker will talk with you frequently to get your input and to update you on team discussions.  Team conferences with you and your family in attendance may also be held.  Expected length of stay: 7-10 days Overall anticipated outcome: mod/i-supervision level  Depending on your progress and recovery, your program may change. Your Social Worker will coordinate services and will keep you informed of any changes. Your Social Worker's name and contact numbers are listed  below.  The following services may also be recommended but are not provided by the Inpatient Rehabilitation Center:   Driving Evaluations  Home Health Rehabiltiation Services  Outpatient Rehabilitation Services    Arrangements will be made to provide these services after discharge if needed.  Arrangements include referral to agencies that provide these services.  Your insurance has been verified to be:  Medicare & AARP Your primary doctor is:  Lois Huxley  Pertinent information will be shared with your doctor and your insurance company.  Social Worker:  Dossie Der,  SW [phone] or [phone]  Information discussed with and copy given to patient by: Lucy Chris, 11/17/2015, 9:43 AM

## 2015-11-17 NOTE — Evaluation (Signed)
Physical Therapy Assessment and Plan  Patient Details  Name: Kendra Hughes MRN: [id number] Date of Birth: June 13, 1923  PT Diagnosis: Difficulty walking, Muscle weakness and Pain in joint Rehab Potential: Good ELOS: 6-10 days    Today's Date: 11/17/2015 PT Individual Time: 4098-1191 PT Individual Time Calculation (min): 75 min    Problem List:  Patient Active Problem List   Diagnosis Date Noted  . S/P hip hemiarthroplasty   . Hypoalbuminemia due to protein-calorie malnutrition ([place])   . Urinary retention   . Absence of bladder continence   . Insomnia   . Femoral neck fracture ([place]) 11/16/2015  . Atrial fibrillation with rapid ventricular response ([place])   . Primary osteoarthritis of right knee   . Atrial fibrillation with RVR ([place])   . Fracture of femoral neck, left ([place])   . Tachycardia   . Acute blood loss anemia   . Postoperative pain   . Postoperative hypotension   . Vitamin D deficiency   . Hyponatremia   . Status post hip hemiarthroplasty   . Closed left hip fracture ([place]) 11/10/2015  . Chronic diastolic (congestive) heart failure ([place])   . Hypotension 08/26/2013  . Mild AI, MR, mod TR by TEE 08/23/13 08/26/2013  . Persistent atrial fibrillation ([place]) 02/12/2013  . HTN (hypertension) 02/12/2013  . Hyperlipidemia 02/12/2013  . DOE (dyspnea on exertion) 02/12/2013    Past Medical History:  Past Medical History  Diagnosis Date  . PAF (paroxysmal atrial fibrillation) ([place])   . Hypertension   . Dyslipidemia   . DOE (dyspnea on exertion)   . History of nuclear stress test 10/2009    normal exam  . Chronic diastolic (congestive) heart failure ([place])   . DVT (deep venous thrombosis) ([place]) "years ago"  . Fracture of left hip ([place]) 11/09/2015    "fell"  . Arthritis     "right knee" (11/10/2015)  . Rheumatoid arthritis ([place]) 1970    "it's in remission" (11/10/2015)   Past Surgical History:  Past Surgical History  Procedure Laterality Date  . Abdominal hysterectomy  1996   . Breast biopsy Right 1984    "benign"  . Transthoracic echocardiogram  07/22/2012    EF 55-60%; mild AV regurg; mild MR; mild-mod TR  . Tee without cardioversion N/A 11/01/2013    Procedure: TRANSESOPHAGEAL ECHOCARDIOGRAM (TEE);  Surgeon: Pixie Casino, MD;  [facility] ENDOSCOPY;  Service: Cardiovascular;  Laterality: N/A;  . Cardioversion N/A 11/01/2013    Procedure: CARDIOVERSION;  Surgeon: Pixie Casino, MD;  Location: [facility] ENDOSCOPY;  Service: Cardiovascular;  Laterality: N/A;  . Tonsillectomy    . Cataract extraction w/ intraocular lens  implant, bilateral Bilateral 2003  . Bladder suspension  X 2  . Rectocele repair    . Total hip arthroplasty Left 11/11/2015    Procedure:  HEMI ARTHROPLASTY ANTERIOR APPROACH LEFT;  Surgeon: Mcarthur Rossetti, MD;  Location: [facility];  Service: Orthopedics;  Laterality: Left;    Assessment & Plan Clinical Impression: Patient is a89 y.o. right handed female with history of hypertension, chronic atrial fibrillation maintained on Coumadin with history of DCCV, diastolic congestive heart failure, vitamin D deficiency. Patient lives with spouse independent prior to admission. She still drives. One level home with 2 steps to entry. Presented 11/10/2015 after mechanical fall and presented to [facility] and imaging revealed left hip displaced femoral neck fracture. She was transferred to [facility] and underwent left hip bipolar hemiarthroplasty through direct anterior approach 11/11/2015 per Dr. Ninfa Linden. Hospital  course pain management. Weightbearing as tolerated. Chronic Coumadin resumed with intravenous heparin for bridging. Acute blood loss anemia 9.0 and monitor. On 11/12/2015 patient with atrial fibrillation with RVR heart rate 110-130. Cardiology was consulted and placed on low dose beta blocker and Cardizem as well as intravenous amiodarone. She was slowly transitioned to PO. Mildly elevated troponin felt to be nonspecific. No  plan for cardioversion at this time.  Patient transferred to [facility] on 11/16/2015 .   Patient currently requires min with mobility secondary to muscle weakness and muscle joint tightness, decreased cardiorespiratoy endurance and decreased standing balance.  Prior to hospitalization, patient was independent  with mobility and lived with Spouse in a House home.  Home access is 2Stairs to enter.  Patient will benefit from skilled PT intervention to maximize safe functional mobility, minimize fall risk and decrease caregiver burden for planned discharge home with intermittent assist.  Anticipate patient will benefit from follow up [facility] at discharge.  PT - End of Session Activity Tolerance: Tolerates 30+ min activity with multiple rests Endurance Deficit: Yes PT Assessment Rehab Potential (ACUTE/IP ONLY): Good PT Patient demonstrates impairments in the following area(s): Balance;Endurance;Pain;Safety PT Transfers Functional Problem(s): Bed Mobility;Bed to Chair;Car;Furniture;Floor PT Locomotion Functional Problem(s): Ambulation;Wheelchair Mobility;Stairs PT Plan PT Intensity: Minimum of 1-2 x/day ,45 to 90 minutes PT Frequency: 5 out of 7 days PT Duration Estimated Length of Stay: 6-10 days  PT Treatment/Interventions: Ambulation/gait training;Balance/vestibular training;Community reintegration;Discharge planning;Disease management/prevention;DME/adaptive equipment instruction;Functional mobility training;Neuromuscular re-education;Pain management;Patient/family education;Psychosocial support;Skin care/wound management;Stair training;Therapeutic Activities;Therapeutic Exercise;UE/LE Strength taining/ROM;UE/LE Coordination activities;Visual/perceptual remediation/compensation;Wheelchair propulsion/positioning PT Transfers Anticipated Outcome(s): Mod I with LRAD PT Locomotion Anticipated Outcome(s): Supervision with LRAD  PT Recommendation Follow Up Recommendations: Home health PT Patient destination:  Home Equipment Recommended: Wheelchair (measurements);Wheelchair cushion (measurements);Rolling walker with 5" wheels;To be determined  Skilled Therapeutic Intervention PT performed Evaluation and initiated treatment interventions. See below for results.  PT also instructed patient in car transfer without AD, required mod A to stand from low seat height. Car transfer also performed with RW, with min A from PT for improved safety and cues for UE placement and improved posture for transfer. Gait training on uneven surface for 82f with min A from PT as well as cues for improved weight bearing through RW and to keep COM within RW for increased support. Patient performed stand pivot transfer with PT with and without AD. Min A provided with AD for transfers and mod A provided without AD; moderate verbal and visual cues provided for improved safety, improved gait pattern, increased weight shifting and erect posture.  Patient instructed in picking object up from floor with in min A and 1 UE support on RW.   Patient returned to room and [facility] in reach.   PT Evaluation Precautions/Restrictions Precautions Precautions: Fall Restrictions Weight Bearing Restrictions: Yes LLE Weight Bearing: Weight bearing as tolerated General Chart Reviewed: Yes Additional Pertinent History: RVR. Afib.  Family/Caregiver Present: No Vital SignsTherapy Vitals Temp: 98 F (36.7 C) Temp Source: Oral Pulse Rate: 100 Resp: 18 BP: 123/69 mmHg Patient Position (if appropriate): Sitting Oxygen Therapy SpO2: 96 % O2 Device: Not Delivered Pain Pain Assessment Pain Assessment: No/denies pain Pain Score: 0-No pain Home Living/Prior Functioning Home Living Available Help at Discharge: Family;Available 24 hours/day Type of Home: House Home Access: Stairs to enter [employer] Steps: 2 Entrance Stairs-Rails: Left Home Layout: One level Bathroom Shower/Tub: Walk-in shower  (built-in seat, pt doesn't use.) Bathroom Toilet: Handicapped height Bathroom Accessibility: Yes  Additional Comments: Fully accessible bathroom (new addition) with grab bars in shower and by toilet  Lives With: Spouse Prior Function Level of Independence: Independent with basic ADLs  Able to Take Stairs?: Yes Driving: Yes Vocation: Retired Leisure: Hobbies-yes (Comment) Comments: Still drives, cooking./cleaning, shopping Vision/Perception     Cognition Overall Cognitive Status: Within Functional Limits for tasks assessed Arousal/Alertness: Awake/alert Orientation Level: Oriented X4 Attention: Alternating Memory: Appears intact Awareness: Appears intact Problem Solving: Appears intact Safety/Judgment: Appears intact Sensation Sensation Light Touch: Appears Intact Stereognosis: Appears Intact Hot/Cold: Appears Intact Proprioception: Appears Intact Coordination Gross Motor Movements are Fluid and Coordinated: Yes Fine Motor Movements are Fluid and Coordinated: Yes Heel Shin Test: WNL with available ROM.  Motor  Motor Motor: Within Functional Limits Motor - Skilled Clinical Observations: Mild post surgical weakness.   Mobility Bed Mobility Bed Mobility: Rolling Right;Rolling Left;Supine to Sit;Sit to Supine Rolling Right: 4: Min guard Rolling Left: 4: Min guard Rolling Left Details: Verbal cues for sequencing;Verbal cues for technique;Verbal cues for precautions/safety;Visual cues/gestures for precautions/safety Supine to Sit: 4: Min assist Supine to Sit Details: Verbal cues for sequencing;Verbal cues for technique;Verbal cues for precautions/safety;Verbal cues for safe use of DME/AE Sitting - Scoot to Edge of Bed: 4: Min assist Sitting - Scoot to [employer] of Bed Details: Verbal cues for sequencing;Verbal cues for technique;Verbal cues for precautions/safety Sitting - Scoot to Edge of Bed Details (indicate cue type and reason): cues for improved erect posture in increased  forward lean at hips.  Sit to Supine: 4: Min assist Sit to Supine - Details: Tactile cues for weight shifting;Verbal cues for sequencing;Verbal cues for technique;Verbal cues for precautions/safety;Verbal cues for safe use of DME/AE;Manual facilitation for weight shifting Transfers Transfers: Yes Sit to Stand: 4: Min assist Sit to Stand Details: Verbal cues for sequencing;Verbal cues for technique;Verbal cues for precautions/safety;Tactile cues for weight shifting;Tactile cues for posture;Verbal cues for safe use of DME/AE Stand to Sit: 4: Min assist Stand to Sit Details (indicate cue type and reason): Verbal cues for sequencing;Verbal cues for technique;Verbal cues for precautions/safety;Verbal cues for safe use of DME/AE;Tactile cues for posture;Tactile cues for placement Stand Pivot Transfers: 4: Min assist;3: Mod assist Stand Pivot Transfer Details: Tactile cues for posture;Tactile cues for placement;Verbal cues for sequencing;Verbal cues for technique;Verbal cues for precautions/safety;Verbal cues for safe use of DME/AE;Verbal cues for gait pattern;Tactile cues for weight shifting Locomotion  Ambulation Ambulation: Yes Ambulation/Gait Assistance: 4: Min assist Ambulation Distance (Feet): 60 Feet Assistive device: Rolling walker Ambulation/Gait Assistance Details: Verbal cues for sequencing;Verbal cues for technique;Verbal cues for precautions/safety;Verbal cues for gait pattern;Tactile cues for posture Ambulation/Gait Assistance Details: cues for increased weight bearing through BUE, increased step length and increased heel contact on the LLE>  Gait Gait: Yes Gait Pattern: Impaired Gait Pattern: Step-through pattern;Decreased stance time - left;Decreased step length - right;Decreased hip/knee flexion - left;Antalgic Gait velocity: 0.65ms  Stairs / Additional Locomotion Stairs: Yes Stairs Assistance: 3: Mod assist Stairs Assistance Details: Verbal cues for sequencing;Verbal cues for  technique;Verbal cues for precautions/safety;Tactile cues for weight shifting;Tactile cues for placement;Manual facilitation for placement;Verbal cues for gait pattern Stair Management Technique: Two rails Number of Stairs: 4 Height of Stairs: 6 Curb: 3: Mod aChemical engineer Yes Wheelchair Assistance: 5: SCareers information officer Both upper extremities Wheelchair Parts Management: Needs assistance;Supervision/cueing  Trunk/Postural Assessment  Cervical Assessment Cervical Assessment: Within FScientist, physiologicalAssessment: Within Functional Limits Lumbar Assessment Lumbar Assessment: Within Functional Limits Postural Control Postural Control: Within Functional  Limits  Balance Balance Balance Assessed: Yes Standardized Balance Assessment Standardized Balance Assessment: Timed Up and Go Test Timed Up and Go Test TUG: Normal TUG Normal TUG (seconds): 81 Static Sitting Balance Static Sitting - Level of Assistance: 5: Stand by assistance Dynamic Sitting Balance Dynamic Sitting - Balance Support: Feet supported Dynamic Sitting - Level of Assistance: 5: Stand by assistance Dynamic Sitting - Balance Activities: Reaching for objects;Forward lean/weight shifting Static Standing Balance Static Standing - Balance Support: No upper extremity supported Static Standing - Level of Assistance: 4: Min assist Dynamic Standing Balance Dynamic Standing - Balance Support: Left upper extremity supported Dynamic Standing - Level of Assistance: 4: Min assist Dynamic Standing - Balance Activities: Reaching for objects Extremity Assessment      RLE Assessment RLE Assessment: Within Functional Limits (grossly 4/5 throughout LE) LLE Assessment LLE Assessment: Exceptions to WFL LLE AROM (degrees) LLE Overall AROM Comments: Patient lacking approx 20% hip flexion.  LLE Strength LLE Overall Strength Comments: 4-/5 for all hip and knee  motions. with minor increase in pain with hip flexion and abduction MMT    See Function Navigator for Current Functional Status.   Refer to Care Plan for Long Term Goals  Recommendations for other services: None  Discharge Criteria: Patient will be discharged from PT if patient refuses treatment 3 consecutive times without medical reason, if treatment goals not met, if there is a change in medical status, if patient makes no progress towards goals or if patient is discharged from hospital.  The above assessment, treatment plan, treatment alternatives and goals were discussed and mutually agreed upon: by patient  Lorie Phenix 11/17/2015, 5:51 PM

## 2015-11-17 NOTE — Progress Notes (Signed)
ANTICOAGULATION CONSULT NOTE - Follow Up Consult  Pharmacy Consult for coumadin Indication: atrial fibrillation  No Known Allergies  Patient Measurements: Height: 5\' 5"  (165.1 cm) Weight: 165 lb 5.5 oz (75 kg) IBW/kg (Calculated) : 57 Heparin Dosing Weight:   Vital Signs: Temp: 98.1 F (36.7 C) (05/12 0500) Temp Source: Oral (05/12 0500) BP: 110/60 mmHg (05/12 0500) Pulse Rate: 96 (05/12 0500)  Labs:  Recent Labs  11/15/15 0403 11/15/15 0404 11/16/15 0318 11/17/15 0508  HGB 8.1*  --  9.7* 9.7*  HCT 24.9*  --  29.6* 29.4*  PLT 220  --  239 254  LABPROT 22.9*  --  23.1* 23.7*  INR 2.04*  --  2.07* 2.13*  HEPARINUNFRC  --  0.42  --   --   CREATININE  --   --  0.70 0.67    Estimated Creatinine Clearance: 47.4 mL/min (by C-G formula based on Cr of 0.67).   Medications:  Scheduled:  . amiodarone  400 mg Oral BID  . atorvastatin  10 mg Oral q1800  . diclofenac sodium  2 g Topical QID  . diltiazem  120 mg Oral Daily  . fluticasone  1 spray Each Nare Daily  . folic acid  1 mg Oral Daily  . furosemide  20 mg Oral Daily  . loratadine  10 mg Oral Daily  . [START ON 11/18/2015] Vitamin D (Ergocalciferol)  50,000 Units Oral Weekly  . warfarin  3 mg Oral Once per day on Sun Tue Wed Thu Fri Sat  . [START ON 11/20/2015] warfarin  4 mg Oral Q Mon-1800  . Warfarin - Pharmacist Dosing Inpatient   Does not apply q1800   Infusions:    Assessment: 83 yo female with afib is currently on therapeutic coumadin.  INR today is up to 2.13.  Added amiodarone on 05/09.  Goal of Therapy:  INR 2-3 Monitor platelets by anticoagulation protocol: Yes   Plan:  Continue Coumadin home regimen: 3mg  daily except 4mg  on Monday   Daily PT/INR  Kendra Hughes, Kendra Hughes 11/17/2015,8:07 AM

## 2015-11-18 ENCOUNTER — Inpatient Hospital Stay (HOSPITAL_COMMUNITY): Payer: Medicare Other | Admitting: Occupational Therapy

## 2015-11-18 ENCOUNTER — Inpatient Hospital Stay (HOSPITAL_COMMUNITY): Payer: Medicare Other | Admitting: Physical Therapy

## 2015-11-18 LAB — PROTIME-INR
INR: 2.27 — ABNORMAL HIGH (ref 0.00–1.49)
PROTHROMBIN TIME: 24.8 s — AB (ref 11.6–15.2)

## 2015-11-18 MED ORDER — TAMSULOSIN HCL 0.4 MG PO CAPS
0.4000 mg | ORAL_CAPSULE | Freq: Every day | ORAL | Status: DC
Start: 2015-11-18 — End: 2015-11-20
  Administered 2015-11-18 – 2015-11-19 (×2): 0.4 mg via ORAL
  Filled 2015-11-18 (×2): qty 1

## 2015-11-18 NOTE — Progress Notes (Signed)
PHYSICAL MEDICINE & REHABILITATION     PROGRESS NOTE  Subjective/Complaints:  Still didn't sleep too well. req I/O caths---volumes in 500-600's  ROS: +Urinary retention, insomnia.  Denies CP, SOB, N/V/D.  Objective: Vital Signs: Blood pressure 118/64, pulse 85, temperature 98.2 F (36.8 C), temperature source Oral, resp. rate 18, height 5\' 5"  (1.651 m), weight 73 kg (160 lb 15 oz), SpO2 93 %. No results found.  Recent Labs  11/16/15 0318 11/17/15 0508  WBC 9.5 8.7  HGB 9.7* 9.7*  HCT 29.6* 29.4*  PLT 239 254    Recent Labs  11/16/15 0318 11/17/15 0508  NA 136 136  K 4.0 3.9  CL 94* 94*  GLUCOSE 109* 103*  BUN 13 11  CREATININE 0.70 0.67  CALCIUM 8.6* 8.7*   CBG (last 3)  No results for input(s): GLUCAP in the last 72 hours.  Wt Readings from Last 3 Encounters:  11/18/15 73 kg (160 lb 15 oz)  11/10/15 70.308 kg (155 lb)  08/21/15 70.67 kg (155 lb 12.8 oz)    Physical Exam:  BP 118/64 mmHg  Pulse 85  Temp(Src) 98.2 F (36.8 C) (Oral)  Resp 18  Ht 5\' 5"  (1.651 m)  Wt 73 kg (160 lb 15 oz)  BMI 26.78 kg/m2  SpO2 93% Constitutional: She appears well-developed and well-nourished. NAD. HENT: Normocephalic and atraumatic.  Eyes: Conjunctivae and EOM are normal.  Cardiovascular: Irregularly irregular  Respiratory: Effort normal and breath sounds normal. No respiratory distress.  GI: Soft. Bowel sounds are normal. She exhibits no distension.  Musculoskeletal: She exhibits edema and tenderness.  Neurological: She is alert and oriented.  Motor: Bilateral upper extremities: 4+/5 proximal to distal Right lower extremity: 4+/5 excellent to distal Left lower extremity hip flexion 2/5, ankle dorsi/plantar flexion 4+/5  Skin: Skin is warm and dry. Hip incision with dressing c/d/i. Psychiatric: She has a normal mood and affect. Her behavior is normal  Assessment/Plan: 1. Functional deficits secondary to left displaced femoral neck fracture status  post left hip bipolar hemiarthroplasty which require 3+ hours per day of interdisciplinary therapy in a comprehensive inpatient rehab setting. Physiatrist is providing close team supervision and 24 hour management of active medical problems listed below. Physiatrist and rehab team continue to assess barriers to discharge/monitor patient progress toward functional and medical goals.  Function:  Bathing Bathing position      Bathing parts      Bathing assist        Upper Body Dressing/Undressing Upper body dressing                    Upper body assist        Lower Body Dressing/Undressing Lower body dressing                                  Lower body assist        Toileting Toileting Toileting activity did not occur: No continent bowel/bladder event Toileting steps completed by patient: Performs perineal hygiene Toileting steps completed by helper: Adjust clothing prior to toileting Toileting Assistive Devices: Grab bar or rail  Toileting assist Assist level: Touching or steadying assistance (Pt.75%)   Transfers Chair/bed transfer   Chair/bed transfer method: Stand pivot Chair/bed transfer assist level: Moderate assist (Pt 50 - 74%/lift or lower)       Locomotion Ambulation     Max distance: 95ft Assist level: Touching or steadying assistance (  Pt > 75%)   Wheelchair   Type: Manual Max wheelchair distance: 122ft Assist Level: Supervision or verbal cues  Cognition Comprehension Comprehension assist level: Understands complex 90% of the time/cues 10% of the time  Expression Expression assist level: Expresses complex 90% of the time/cues < 10% of the time  Social Interaction Social Interaction assist level: Interacts appropriately with others with medication or extra time (anti-anxiety, antidepressant).  Problem Solving Problem solving assist level: Solves basic 90% of the time/requires cueing < 10% of the time  Memory Memory assist level:  Recognizes or recalls 90% of the time/requires cueing < 10% of the time    Medical Problem List and Plan: 1. Debilitation with immobility secondary to left displaced femoral neck fracture status post left hip bipolar hemiarthroplasty direct anterior approach 11/11/2014.   Weightbearing as tolerated  -continue therapies 2. DVT Prophylaxis/Anticoagulation: Chronic Coumadin. Monitor for rebleeding episodes 3. Pain Management: Hydrocodone and Robaxin as needed. Monitor mental status 4. Acute blood loss anemia.   Hb 9.7 on 5/12 (stable)  Will cont to monitor 5. Neuropsych: This patient is capable of making decisions on his own behalf. 6. Skin/Wound Care: Routine skin checks  Per surgery, hip dressing not to be removed or changed until follow up with surgery (~5/20) 7. Fluids/Electrolytes/Nutrition: Routine I&O   BMP within acceptable range on 5/12, will cont to monitor 8. Atrial fibrillation.   Amiodarone 4 mg twice a day  Cardizem CD 120 mg daily, increased to 180 by cardiology  If still in Afib in few weeks, plan for cardioversion  Follow-up cardiology services as needed 9 Diastolic congestive heart failure.   Lasix 20 mg daily.    Weigh patient daily.   Monitor for any signs of fluid overload 10. Hyperlipidemia. Lipitor 11. Constipation. Laxative assistance 12. Hypoalbuminemia  -protein supp 13. GU  Urinary retention and incontinence reported, continue I/O caths  UA neg, UCX pending  -consider flomax   -needs to be up to toilet/commode to empty 14. Insomnia  Low dose trazodone started PRN 5/12--watch for increased urine retention  LOS (Days) 2 A FACE TO FACE EVALUATION WAS PERFORMED  SWARTZ,ZACHARY T 11/18/2015 8:13 AM

## 2015-11-18 NOTE — Plan of Care (Signed)
Problem: RH BLADDER ELIMINATION Goal: RH STG MANAGE BLADDER WITH ASSISTANCE STG Manage Bladder With mod Assistance  Outcome: Not Progressing I and O cath q8

## 2015-11-18 NOTE — Progress Notes (Signed)
ANTICOAGULATION CONSULT NOTE - Follow Up Consult  Pharmacy Consult for coumadin Indication: atrial fibrillation  No Known Allergies  Patient Measurements: Height: 5\' 5"  (165.1 cm) Weight: 160 lb 15 oz (73 kg) IBW/kg (Calculated) : 57 Heparin Dosing Weight:   Vital Signs: Temp: 98.2 F (36.8 C) (05/13 0411) Temp Source: Oral (05/13 0411) BP: 118/64 mmHg (05/13 0411) Pulse Rate: 85 (05/13 0411)  Labs:  Recent Labs  11/16/15 0318 11/17/15 0508 11/18/15 0613  HGB 9.7* 9.7*  --   HCT 29.6* 29.4*  --   PLT 239 254  --   LABPROT 23.1* 23.7* 24.8*  INR 2.07* 2.13* 2.27*  CREATININE 0.70 0.67  --     Estimated Creatinine Clearance: 46.8 mL/min (by C-G formula based on Cr of 0.67).   Medications:  Scheduled:  . amiodarone  400 mg Oral BID  . atorvastatin  10 mg Oral q1800  . diclofenac sodium  2 g Topical QID  . diltiazem  180 mg Oral Daily  . fluticasone  1 spray Each Nare Daily  . folic acid  1 mg Oral Daily  . furosemide  20 mg Oral Daily  . loratadine  10 mg Oral Daily  . protein supplement  1 scoop Oral TID WC  . tamsulosin  0.4 mg Oral QPC supper  . Vitamin D (Ergocalciferol)  50,000 Units Oral Weekly  . warfarin  3 mg Oral Once per day on Sun Tue Wed Thu Fri Sat  . [START ON 11/20/2015] warfarin  4 mg Oral Q Mon-1800  . Warfarin - Pharmacist Dosing Inpatient   Does not apply q1800   Infusions:    Assessment: 82 yo female with afib, currently with therapeutic INR on warfarin.  INR 2.27 today, trending up slightly with addition of amiodarone on 05/09.  Goal of Therapy:  INR 2-3 Monitor platelets by anticoagulation protocol: Yes   Plan:  Warfarin 3 mg x 1 tonight Daily INR  07/09 11/18/2015,11:44 AM

## 2015-11-18 NOTE — Progress Notes (Signed)
Physical Therapy Session Note  Patient Details  Name: Kendra Hughes MRN: [id number] Date of Birth: January 28, 1923  Today's Date: 11/18/2015 PT Individual Time: 1400-1500 PT Individual Time Calculation (min): 60 min   Short Term Goals: Week 1:  PT Short Term Goal 1 (Week 1): LTG = STG due to estimated D/C date.   Skilled Therapeutic Interventions/Progress Updates:    Patient received supine in bed and agreeable to PT. Patient performed supine>sit transfer with min A from PT and cues for improved pushing from BUE to improve ease of transfer. Patient performed stand pivot transfer to [facility] with RW, min A from PT, and heavy cues for improved erect posture in trunk and improved anterior weight shift prior to pivot. Patient performed WC mobility to for 167ft with supervision A with mod cues for improved pursed lip breathing and navigation through door.  Patient performed Nustep for 4 minutes level 2; avg 25spm. PT assessed HR: 113bpm. Patient performed nustep on level 4 for 4 minutes, avg 25spm. PT reassessed HR: 138, less than 1 min to decrease to 110. Patient rated RPE of 13 on Borg scale.   Patient transferred to mat table with RW and instructed in Anterior THR supine therex:  SAQ x12 BLE.  hip abduction in pain free range x 12 RLE, x 10LLE.  Ankle pumps x 20 BLE Quad/glute sets x 12 BLE  Seated marches x 12 BLE.  PT provided close supervision A throughout all therex with mod verbal instruction for improved ROM, decreased speed of movement and increased eccentric control for improved strengthening of exercises.   Gait training performed for 90 ft with RW and min-supervision A form PT. Min cues provided for equaliized step length, and improved pursed lip breathing. PT assessed HR follow gait training: 134bpm, less than 1 min to decrease to 91bpm.   Patient returned to room in [facility] due to LE fatigue. Stand pivot transfer performed to bed with min A from PT, then sit<>supine performed with min A and cues to  maintain hips  In neutral alignment as possible. Patient left supine in bed with call bell within reach.    Therapy Documentation Precautions:  Precautions Precautions: Fall Restrictions Weight Bearing Restrictions: Yes LLE Weight Bearing: Weight bearing as tolerated  See Function Navigator for Current Functional Status.   Therapy/Group: Individual Therapy  Golden Pop 11/18/2015, 5:31 PM

## 2015-11-18 NOTE — Progress Notes (Signed)
Occupational Therapy Session Note  Patient Details  Name: Kendra Hughes MRN: [id number] Date of Birth: [birth year]  Today's Date: 11/18/2015 OT Individual Time:  - 1000-1100  (60 min)      Short Term Goals: Week 1:  OT Short Term Goal 1 (Week 1): STG=LTG d/t short anticipated LOS    Skilled Therapeutic Interventions/Progress Updates:    pt sitting in wc. Discussed POC.  Ambulated to ADLapt  with RW.  HR=92, o2= 100% before walk;       HR=80,O2= 99% after walking 60 feet;  Ambulated in kitchen and did simple meal prep with no heating.  Ambulated with RW for 5 1/2 min before resting; HR= 80, HR= 99;   Enaged in further meal prep for 4 minutes before resting.  Ambulated back to room and left with all needs in reach.  Pt returned to bed to rest after therapy.    Therapy Documentation Precautions:  Precautions Precautions: Fall Restrictions Weight Bearing Restrictions: Yes LLE Weight Bearing: Weight bearing as tolerated Vital Signs:  See above note   Pain:  none   ADL: ADL ADL Comments: See Functional Assessment Tool       See Function Navigator for Current Functional Status.   Therapy/Group: Individual Therapy  Humberto Seals 11/18/2015, 10:18 AM

## 2015-11-18 NOTE — Progress Notes (Signed)
Physical Therapy Session Note  Patient Details  Name: Kendra Hughes MRN: [id number] Date of Birth: [birth year]  Today's Date: 11/18/2015 PT Individual Time: 0800-0900 PT Individual Time Calculation (min): 60 min   Short Term Goals: Week 1:  PT Short Term Goal 1 (Week 1): LTG = STG due to estimated D/C date.   Skilled Therapeutic Interventions/Progress Updates:     Patient received sitting EOB finishing breakfast. Min A to don pant and shirt EOB with sit<>stand to pull pants using RW. Standing balance at sink to brush teeth noUE support but hips braced against sink occasionally; total. 5 minutes standing. WC mobillity for 176f with supervision A from PT with min cues [facility] through door to rehab gym PT administered Berg balance: test see below for results.  Throughout treatment patient performed sit<>stand x 12 with min A from PT and moderate verbal and tactile instruction for push up and to control descent with one UE on sitting surface.  Gait training with RW for 965fand 6070f 2 with min A from PT, and mod cues for improved pursed lip breathing, improved erect posture, and equal step length Bilaterally. Patient demonstrated mild antalgic gait on the LLE due to minor pain in hip that increases with increased distance.  Patient left in room [facility] and all other needs met.     Therapy Documentation Precautions:  Precautions Precautions: Fall Restrictions Weight Bearing Restrictions: Yes LLE Weight Bearing: Weight bearing as tolerated    Balance: Balance Balance Assessed: Yes Standardized Balance Assessment Standardized Balance Assessment: Berg Balance Test Berg Balance Test Sit to Stand: Able to stand  independently using hands Standing Unsupported: Able to stand safely 2 minutes Sitting with Back Unsupported but Feet Supported on Floor or Stool: Able to sit safely and securely 2 minutes Stand to Sit: Controls descent by using  hands Transfers: Able to transfer with verbal cueing and /or supervision Standing Unsupported with Eyes Closed: Able to stand 10 seconds safely Standing Ubsupported with Feet Together: Needs help to attain position but able to stand for 30 seconds with feet together (UE support on RW to attain position) From Standing, Reach Forward with Outstretched Arm: Can reach confidently >25 cm (10") From Standing Position, Pick up Object from Floor: Able to pick up shoe, needs supervision From Standing Position, Turn to Look Behind Over each Shoulder: Turn sideways only but maintains balance Turn 360 Degrees: Needs assistance while turning Standing Unsupported, Alternately Place Feet on Step/Stool: Needs assistance to keep from falling or unable to try Standing Unsupported, One Foot in Front: Needs help to step but can hold 15 seconds Standing on One Leg: Tries to lift leg/unable to hold 3 seconds but remains standing independently Total Score: 32   Patient demonstrates increased fall risk as noted by score of   32/56 on Berg Balance Scale.  (<36= high risk for falls, close to 100%; 37-45 significant >80%; 46-51 moderate >50%; 52-55 lower >25%)   See Function Navigator for Current Functional Status.   Therapy/Group: Individual Therapy  AusLorie Phenix13/2017, 12:21 PM

## 2015-11-19 ENCOUNTER — Inpatient Hospital Stay (HOSPITAL_COMMUNITY): Payer: Medicare Other | Admitting: Physical Therapy

## 2015-11-19 LAB — URINE CULTURE

## 2015-11-19 LAB — PROTIME-INR
INR: 2.45 — ABNORMAL HIGH (ref 0.00–1.49)
PROTHROMBIN TIME: 26.3 s — AB (ref 11.6–15.2)

## 2015-11-19 MED ORDER — SULFAMETHOXAZOLE-TRIMETHOPRIM 400-80 MG PO TABS
1.0000 | ORAL_TABLET | Freq: Two times a day (BID) | ORAL | Status: DC
  Administered 2015-11-19 – 2015-11-23 (×8): 1 via ORAL
  Filled 2015-11-19 (×8): qty 1

## 2015-11-19 NOTE — Progress Notes (Signed)
ANTICOAGULATION CONSULT NOTE - Follow Up Consult  Pharmacy Consult for coumadin Indication: atrial fibrillation  No Known Allergies  Patient Measurements: Height: 5\' 5"  (165.1 cm) Weight: 166 lb 7.2 oz (75.5 kg) IBW/kg (Calculated) : 57 Heparin Dosing Weight:   Vital Signs: Temp: 97.3 F (36.3 C) (05/14 1300) Temp Source: Oral (05/14 1300) BP: 109/46 mmHg (05/14 1300) Pulse Rate: 84 (05/14 1300)  Labs:  Recent Labs  11/17/15 0508 11/18/15 0613 11/19/15 0440  HGB 9.7*  --   --   HCT 29.4*  --   --   PLT 254  --   --   LABPROT 23.7* 24.8* 26.3*  INR 2.13* 2.27* 2.45*  CREATININE 0.67  --   --     Estimated Creatinine Clearance: 47.5 mL/min (by C-G formula based on Cr of 0.67).   Medications:  Scheduled:  . amiodarone  400 mg Oral BID  . atorvastatin  10 mg Oral q1800  . diclofenac sodium  2 g Topical QID  . diltiazem  180 mg Oral Daily  . fluticasone  1 spray Each Nare Daily  . folic acid  1 mg Oral Daily  . furosemide  20 mg Oral Daily  . loratadine  10 mg Oral Daily  . protein supplement  1 scoop Oral TID WC  . tamsulosin  0.4 mg Oral QPC supper  . Vitamin D (Ergocalciferol)  50,000 Units Oral Weekly  . warfarin  3 mg Oral Once per day on Sun Tue Wed Thu Fri Sat  . [START ON 11/20/2015] warfarin  4 mg Oral Q Mon-1800  . Warfarin - Pharmacist Dosing Inpatient   Does not apply q1800   Infusions:    Assessment: 82 yo female with afib, currently with therapeutic INR on warfarin.  INR 2.45 today, trending up slightly with addition of amiodarone on 05/09.  Goal of Therapy:  INR 2-3 Monitor platelets by anticoagulation protocol: Yes   Plan:  Warfarin 3 mg x 1 tonight Daily INR  07/09 11/19/2015,2:50 PM

## 2015-11-19 NOTE — Progress Notes (Signed)
Kendra Hughes PHYSICAL MEDICINE & REHABILITATION     PROGRESS NOTE  Subjective/Complaints:  Had much better night. Emptied bladder overnight--no need for cath  ROS:    Denies CP, SOB, N/V/D.  Objective: Vital Signs: Blood pressure 98/62, pulse 113, temperature 98.1 F (36.7 C), temperature source Oral, resp. rate 18, height 5\' 5"  (1.651 m), weight 75.5 kg (166 lb 7.2 oz), SpO2 94 %. No results found.  Recent Labs  11/17/15 0508  WBC 8.7  HGB 9.7*  HCT 29.4*  PLT 254    Recent Labs  11/17/15 0508  NA 136  K 3.9  CL 94*  GLUCOSE 103*  BUN 11  CREATININE 0.67  CALCIUM 8.7*   CBG (last 3)  No results for input(s): GLUCAP in the last 72 hours.  Wt Readings from Last 3 Encounters:  11/19/15 75.5 kg (166 lb 7.2 oz)  11/10/15 70.308 kg (155 lb)  08/21/15 70.67 kg (155 lb 12.8 oz)    Physical Exam:  BP 98/62 mmHg  Pulse 113  Temp(Src) 98.1 F (36.7 C) (Oral)  Resp 18  Ht 5\' 5"  (1.651 m)  Wt 75.5 kg (166 lb 7.2 oz)  BMI 27.70 kg/m2  SpO2 94% Constitutional: She appears well-developed and well-nourished. NAD. HENT: Normocephalic and atraumatic.  Eyes: Conjunctivae and EOM are normal.  Cardiovascular: Irregularly irregular  Respiratory: Effort normal and breath sounds normal. No respiratory distress.  GI: Soft. Bowel sounds are normal. She exhibits no distension.  Musculoskeletal: She exhibits edema and tenderness.  Neurological: She is alert and oriented.  Motor: Bilateral upper extremities: 4+/5 proximal to distal Right lower extremity: 4+/5 excellent to distal Left lower extremity hip flexion 2/5, ankle dorsi/plantar flexion 4+/5  Skin: Skin is warm and dry. Hip incision with dressing c/d/i. Psychiatric: She has a normal mood and affect. Her behavior is normal  Assessment/Plan: 1. Functional deficits secondary to left displaced femoral neck fracture status post left hip bipolar hemiarthroplasty which require 3+ hours per day of interdisciplinary therapy  in a comprehensive inpatient rehab setting. Physiatrist is providing close team supervision and 24 hour management of active medical problems listed below. Physiatrist and rehab team continue to assess barriers to discharge/monitor patient progress toward functional and medical goals.  Function:  Bathing Bathing position      Bathing parts      Bathing assist        Upper Body Dressing/Undressing Upper body dressing                    Upper body assist        Lower Body Dressing/Undressing Lower body dressing                                  Lower body assist        Toileting Toileting Toileting activity did not occur: No continent bowel/bladder event Toileting steps completed by patient: Performs perineal hygiene Toileting steps completed by helper: Adjust clothing prior to toileting Toileting Assistive Devices: Grab bar or rail  Toileting assist Assist level: Touching or steadying assistance (Pt.75%)   Transfers Chair/bed transfer   Chair/bed transfer method: Stand pivot Chair/bed transfer assist level: Touching or steadying assistance (Pt > 75%) Chair/bed transfer assistive device: Armrests, 08/23/15     Max distance: 60 Assist level: Touching or steadying assistance (Pt > 75%)   Wheelchair   Type: Manual Max wheelchair distance:  150 Assist Level: Supervision or verbal cues  Cognition Comprehension Comprehension assist level: Understands complex 90% of the time/cues 10% of the time  Expression Expression assist level: Expresses complex 90% of the time/cues < 10% of the time  Social Interaction Social Interaction assist level: Interacts appropriately with others with medication or extra time (anti-anxiety, antidepressant).  Problem Solving Problem solving assist level: Solves basic 90% of the time/requires cueing < 10% of the time  Memory Memory assist level: Recognizes or recalls 90% of the time/requires cueing <  10% of the time    Medical Problem List and Plan: 1. Debilitation with immobility secondary to left displaced femoral neck fracture status post left hip bipolar hemiarthroplasty direct anterior approach 11/11/2014.   Weightbearing as tolerated  -continue therapies 2. DVT Prophylaxis/Anticoagulation: Chronic Coumadin. Monitor for rebleeding episodes 3. Pain Management: Hydrocodone and Robaxin as needed. Monitor mental status 4. Acute blood loss anemia.   Hb 9.7 on 5/12 (stable)  Will cont to monitor 5. Neuropsych: This patient is capable of making decisions on his own behalf. 6. Skin/Wound Care: Routine skin checks  Per surgery, hip dressing not to be removed or changed until follow up with surgery (~5/20) 7. Fluids/Electrolytes/Nutrition: Routine I&O   BMP within acceptable range on 5/12, will cont to monitor 8. Atrial fibrillation.   Amiodarone 4 mg twice a day  Cardizem CD 120 mg daily, increased to 180 by cardiology  If still in Afib in few weeks, plan for cardioversion  Follow-up cardiology services as needed 9 Diastolic congestive heart failure.   Lasix 20 mg daily.    Weigh patient daily.   Monitor for any signs of fluid overload 10. Hyperlipidemia. Lipitor 11. Constipation. Laxative assistance 12. Hypoalbuminemia  -protein supp 13. GU  Urinary retention and incontinence reported, continue I/O caths  UA neg, UCX STILL pending  -continue flomax ---watch for symptomatic hypotension  -needs to be up to toilet/commode to empty 14. Insomnia  Low dose trazodone started PRN 5/12-   LOS (Days) 3 A FACE TO FACE EVALUATION WAS PERFORMED  Azari Janssens T 11/19/2015 7:48 AM

## 2015-11-19 NOTE — Progress Notes (Signed)
Physical Therapy Session Note  Patient Details  Name: Kendra Hughes MRN: [id number] Date of Birth: 05-21-1923  Today's Date: 11/19/2015 PT Individual Time: 1134-1200 PT Individual Time Calculation (min): 26 min   Short Term Goals: Week 1:  PT Short Term Goal 1 (Week 1): LTG = STG due to estimated D/C date.   Skilled Therapeutic Interventions/Progress Updates:    Pt received in w/c with RN present & pt agreeable to PT. RN requested pt not need chair alarm as pt is doing well with calling nursing staff for assistance. Pt noted 5/10 BLE soreness following therapy treatment yesterday but able to transfer sit<>stand with supervision, then gait training 100 ft with RW & supervision. In ortho gym pt completed car transfer from sedan simulated seat height with supervision & RW. Pt utilized BUE to assist BLE into car. Gait training 100 ft back to room & pt noted fatigue. After rest break pt completed 5x sit-to-stand activity with heavy cuing for hand placement; pt able to complete task in 63 seconds & noted being "out of breath" afterwards. Pt performed LLE long arc quads x 10 reps noting no pain. Therapist educated pt on L anterior hip precautions & pt left in w/c with all needs within reach & instruction to continue calling for nursing assistance with mobility & pt agreeable. Discontinued use of chair alarm RN notified.  Therapy Documentation Precautions:  Precautions Precautions: Fall Restrictions Weight Bearing Restrictions: Yes LLE Weight Bearing: Weight bearing as tolerated  Vital Signs: Therapy Vitals Pulse Rate: 72 (via dinamap; 92 when assessed manually) BP: 114/60 mmHg Patient Position (if appropriate): Sitting  Pain: Pain Assessment Pain Assessment: 0-10 Pain Score: 5  Pain Location: Leg Pain Orientation: Left;Right Pain Descriptors / Indicators: Sore Pain Intervention(s): Repositioned;Ambulation/increased activity   See Function Navigator for Current Functional  Status.   Therapy/Group: Individual Therapy  Sandi Mariscal 11/19/2015, 8:34 AM

## 2015-11-20 ENCOUNTER — Inpatient Hospital Stay (HOSPITAL_COMMUNITY): Payer: Medicare Other | Admitting: Occupational Therapy

## 2015-11-20 ENCOUNTER — Inpatient Hospital Stay (HOSPITAL_COMMUNITY): Payer: Medicare Other

## 2015-11-20 DIAGNOSIS — I481 Persistent atrial fibrillation: Secondary | ICD-10-CM

## 2015-11-20 DIAGNOSIS — L03113 Cellulitis of right upper limb: Secondary | ICD-10-CM | POA: Insufficient documentation

## 2015-11-20 DIAGNOSIS — N390 Urinary tract infection, site not specified: Secondary | ICD-10-CM | POA: Insufficient documentation

## 2015-11-20 LAB — PROTIME-INR
INR: 2.73 — ABNORMAL HIGH (ref 0.00–1.49)
Prothrombin Time: 28.5 seconds — ABNORMAL HIGH (ref 11.6–15.2)

## 2015-11-20 MED ORDER — WARFARIN SODIUM 2 MG PO TABS
2.0000 mg | ORAL_TABLET | Freq: Once | ORAL | Status: AC
  Administered 2015-11-20: 2 mg via ORAL
  Filled 2015-11-20: qty 1

## 2015-11-20 NOTE — Progress Notes (Signed)
Kendra Hughes     PROGRESS NOTE  Subjective/Complaints:  Patient seen ambulating from the restaurant to her bed. She states she had a good weekend, but notes a small papule on dorsal right wrist. She states it is tender.  ROS:    + Small Tender papule right dorsal wrist. Denies CP, SOB, N/V/D.  Objective: Vital Signs: Blood pressure 104/63, pulse 84, temperature 98 F (36.7 C), temperature source Oral, resp. rate 18, height 5\' 5"  (1.651 m), weight 75.4 kg (166 lb 3.6 oz), SpO2 95 %. No results found. No results for input(s): WBC, HGB, HCT, PLT in the last 72 hours. No results for input(s): NA, K, CL, GLUCOSE, BUN, CREATININE, CALCIUM in the last 72 hours.  Invalid input(s): CO CBG (last 3)  No results for input(s): GLUCAP in the last 72 hours.  Wt Readings from Last 3 Encounters:  11/20/15 75.4 kg (166 lb 3.6 oz)  11/10/15 70.308 kg (155 lb)  08/21/15 70.67 kg (155 lb 12.8 oz)    Physical Exam:  BP 104/63 mmHg  Pulse 84  Temp(Src) 98 F (36.7 C) (Oral)  Resp 18  Ht 5\' 5"  (1.651 m)  Wt 75.4 kg (166 lb 3.6 oz)  BMI 27.66 kg/m2  SpO2 95% Constitutional: She appears well-developed and well-nourished. NAD. HENT: Normocephalic and atraumatic.  Eyes: Conjunctivae and EOM are normal.  Cardiovascular: Irregularly irregular  Respiratory: Effort normal and breath sounds normal. No respiratory distress.  GI: Soft. Bowel sounds are normal. She exhibits no distension.  Musculoskeletal: She exhibits edema and tenderness.  Neurological: She is alert and oriented.  Motor: Bilateral upper extremities: 4+/5 proximal to distal Right lower extremity: 4+/5 excellent to distal Left lower extremity hip flexion 2/5, ankle dorsi/plantar flexion 4+/5  Skin: Hip incision with dressing c/d/i. Small tender papule right wrist.  Psychiatric: She has a normal mood and affect. Her behavior is normal  Assessment/Plan: 1. Functional deficits secondary to left  displaced femoral neck fracture status post left hip bipolar hemiarthroplasty which require 3+ hours per day of interdisciplinary therapy in a comprehensive inpatient rehab setting. Physiatrist is providing close team supervision and 24 hour management of active medical problems listed below. Physiatrist and rehab team continue to assess barriers to discharge/monitor patient progress toward functional and medical goals.  Function:  Bathing Bathing position      Bathing parts      Bathing assist        Upper Body Dressing/Undressing Upper body dressing                    Upper body assist        Lower Body Dressing/Undressing Lower body dressing                                  Lower body assist        Toileting Toileting Toileting activity did not occur: No continent bowel/bladder event Toileting steps completed by patient: Performs perineal hygiene Toileting steps completed by helper: Adjust clothing prior to toileting Toileting Assistive Devices: Grab bar or rail  Toileting assist Assist level: Touching or steadying assistance (Pt.75%)   Transfers Chair/bed transfer   Chair/bed transfer method: Stand pivot Chair/bed transfer assist level: Touching or steadying assistance (Pt > 75%) Chair/bed transfer assistive device: Armrests, 08/23/15     Max distance: 100 ft Assist level: Supervision or verbal cues  Wheelchair   Type: Manual Max wheelchair distance: 150 Assist Level: Supervision or verbal cues  Cognition Comprehension Comprehension assist level: Understands complex 90% of the time/cues 10% of the time  Expression Expression assist level: Expresses complex 90% of the time/cues < 10% of the time  Social Interaction Social Interaction assist level: Interacts appropriately with others with medication or extra time (anti-anxiety, antidepressant).  Problem Solving Problem solving assist level: Solves basic 90% of the  time/requires cueing < 10% of the time  Memory Memory assist level: Recognizes or recalls 90% of the time/requires cueing < 10% of the time    Medical Problem List and Plan: 1. Debilitation with immobility secondary to left displaced femoral neck fracture status post left hip bipolar hemiarthroplasty direct anterior approach 11/11/2014.   Weightbearing as tolerated  -continue therapies 2. DVT Prophylaxis/Anticoagulation: Chronic Coumadin. Monitor for rebleeding episodes 3. Pain Management: Hydrocodone and Robaxin as needed. Monitor mental status 4. Acute blood loss anemia.   Hb 9.7 on 5/12 (stable)  Will cont to monitor 5. Neuropsych: This patient is capable of making decisions on his own behalf. 6. Skin/Wound Care: Routine skin checks  Per surgery, hip dressing not to be removed or changed until follow up with surgery (~5/20) 7. Fluids/Electrolytes/Nutrition: Routine I&O   BMP within acceptable range on 5/12, will cont to monitor 8. Atrial fibrillation.   Amiodarone 4 mg twice a day  Cardizem CD 120 mg daily, increased to 180 by cardiology  If still in Afib in few weeks, plan for cardioversion  Follow-up cardiology services as needed 9 Diastolic congestive heart failure.   Lasix 20 mg daily.    Weigh patient daily.   Monitor for any signs of fluid overload 10. Hyperlipidemia. Lipitor 11. Constipation. Laxative assistance 12. Hypoalbuminemia  -protein supp 13. GU  Urinary retention and incontinence reported, continue I/O caths, appears to be improving  UA neg, urine culture with Citrobacter freundii on 5/12  Bactrim started 5/14-5/19  Will DC Flomax 14. Insomnia  Low dose trazodone started PRN 5/12-  15. Cellulitis right dorsal wrist  Small pinpoint papule  Patient currently on Bactrim  Will continue to monitor for improvement  LOS (Days) 4 A FACE TO FACE EVALUATION WAS PERFORMED  Kendra Hughes 11/20/2015 8:59 AM

## 2015-11-20 NOTE — Progress Notes (Signed)
Trish Mage, RN Rehab Admission Coordinator Signed Physical Medicine and Rehabilitation PMR Pre-admission 11/16/2015 2:47 PM  Related encounter: Admission (Discharged) from 11/10/2015 in [facility] 2W CARDIAC UNIT    Expand All Collapse All   PMR Admission Coordinator Pre-Admission Assessment  Patient: Kendra Hughes is an 82 y.o., female MRN: [id number] DOB: 1922/12/18 Height:  (165.1 cm) Weight: 70.308 kg (155 lb)  Insurance Information HMO: PPO: PCP: IPA: 80/20: OTHER:  PRIMARY: Medicare A/B Policy#: [id number] Subscriber: Harlon Flor CM Name: Phone#: Fax#:  Pre-Cert#: Employer: Retired Benefits: Phone #: Name: Checked in [place]. Date: 03/08/90 Deduct: $1316 Out of Pocket Max: None Life Max: unlimited CIR: 100% SNF: 100 days Outpatient: 80% Co-Pay: 20% Home Health: 100% Co-Pay: none DME: 80% Co-Pay: 20% Providers: patient's choice  SECONDARY: AARP Policy#: [id number] Subscriber: Harlon Flor CM Name: Phone#: Fax#:  Pre-Cert#: Employer: Retired Benefits: Phone #: [phone] Name:  Eff. Date: Deduct: Out of Pocket Max: Life Max:  CIR: SNF:  Outpatient: Co-Pay:  Home Health: Co-Pay:  DME: Co-Pay:   Emergency Contact Information Contact Information    Name Relation Home Work Mobile   Pennywell,Wade Spouse [phone]     [place],Phillip Son        Current Medical History  Patient Admitting Diagnosis: Left displaced femoral neck fracture  History of Present Illness: A 82 y.o. right handed female with history of hypertension, chronic atrial fibrillation maintained on Coumadin with history of  DCCV, diastolic congestive heart failure, vitamin D deficiency. Patient lives with spouse independent prior to admission. She still drives. One level home with 2 steps to entry. Presented 11/10/2015 after mechanical fall and presented to [facility]. X-rays and imaging revealed left hip displaced femoral neck fracture. She was transferred to [facility] and underwent left hip bipolar hemiarthroplasty through direct anterior approach 11/11/2015 per Dr. Magnus Ivan. Hospital course pain management. Weightbearing as tolerated. Chronic Coumadin resumed with intravenous heparin for bridging. Acute blood loss anemia 9.0 and monitor. On 11/12/2015 patient with atrial fibrillation with RVR heart rate 110-130. Cardiology was consulted and placed on low dose beta blocker and Cardizem as well as intravenous amiodarone. She was slowly transitioned to PO. Mildly elevated troponin felt to be nonspecific. No plan for cardioversion at this time. Physical occupational therapy evaluation completed 11/12/2015 with recommendations of physical medicine rehabilitation consult. Patient to be admitted for a comprehensive inpatient rehabilitation program.   Past Medical History  Past Medical History  Diagnosis Date  . PAF (paroxysmal atrial fibrillation) (HCC)   . Hypertension   . Dyslipidemia   . DOE (dyspnea on exertion)   . History of nuclear stress test 10/2009    normal exam  . Chronic diastolic (congestive) heart failure (HCC)   . DVT (deep venous thrombosis) (HCC) "years ago"  . Fracture of left hip (HCC) 11/09/2015    "fell"  . Arthritis     "right knee" (11/10/2015)  . Rheumatoid arthritis (HCC) 1970    "it's in remission" (11/10/2015)    Family History  family history includes Heart disease in her mother.  Prior Rehab/Hospitalizations: No previous rehab  Has the patient had major surgery during 100 days prior to admission? No  Current Medications    Current facility-administered medications:  . 0.9 % sodium chloride infusion, , Intravenous, Continuous, Kathryne Hitch, MD, Last Rate: 20 mL/hr at 11/14/15 0145 . 0.9 % sodium chloride infusion, , Intravenous, Continuous, Rollene Rotunda, MD, Last Rate: 20 mL/hr at 11/15/15 2207 . acetaminophen (TYLENOL) tablet  650 mg, 650 mg, Oral, Q6H PRN, 650 mg at 11/15/15 1602 **OR** acetaminophen (TYLENOL) suppository 650 mg, 650 mg, Rectal, Q6H PRN, Kathryne Hitch, MD . amiodarone (PACERONE) tablet 400 mg, 400 mg, Oral, BID, Rollene Rotunda, MD, 400 mg at 11/16/15 1104 . antiseptic oral rinse (CPC / CETYLPYRIDINIUM CHLORIDE 0.05%) solution 7 mL, 7 mL, Mouth Rinse, BID, Nishant Dhungel, MD, 7 mL at 11/15/15 2207 . atorvastatin (LIPITOR) tablet 10 mg, 10 mg, Oral, q1800, Lorretta Harp, MD, 10 mg at 11/15/15 1749 . bisacodyl (DULCOLAX) suppository 10 mg, 10 mg, Rectal, Daily PRN, Richarda Overlie, MD, 10 mg at 11/15/15 1750 . bisacodyl (DULCOLAX) suppository 10 mg, 10 mg, Rectal, Once, Richarda Overlie, MD, 10 mg at 11/14/15 1145 . diltiazem (CARDIZEM CD) 24 hr capsule 120 mg, 120 mg, Oral, Daily, Michael Litter, MD, 120 mg at 11/16/15 1103 . fluticasone (FLONASE) 50 MCG/ACT nasal spray 1 spray, 1 spray, Each Nare, Daily, Lorretta Harp, MD, 1 spray at 11/15/15 1111 . folic acid (FOLVITE) tablet 1 mg, 1 mg, Oral, Daily, Lorretta Harp, MD, 1 mg at 11/16/15 1102 . [START ON 11/17/2015] furosemide (LASIX) tablet 20 mg, 20 mg, Oral, Daily, Rollene Rotunda, MD . HYDROcodone-acetaminophen (NORCO/VICODIN) 5-325 MG per tablet 1-2 tablet, 1-2 tablet, Oral, Q6H PRN, Kathryne Hitch, MD, 1 tablet at 11/14/15 [phone] . loratadine (CLARITIN) tablet 10 mg, 10 mg, Oral, Daily, Lorretta Harp, MD, 10 mg at 11/16/15 1103 . menthol-cetylpyridinium (CEPACOL) lozenge 3 mg, 1 lozenge, Oral, PRN **OR** phenol (CHLORASEPTIC) mouth spray 1 spray, 1 spray, Mouth/Throat, PRN, Kathryne Hitch, MD . methocarbamol  (ROBAXIN) tablet 500 mg, 500 mg, Oral, Q6H PRN, 500 mg at 11/14/15 0625 **OR** methocarbamol (ROBAXIN) 500 mg in dextrose 5 % 50 mL IVPB, 500 mg, Intravenous, Q6H PRN, Kathryne Hitch, MD . metoCLOPramide (REGLAN) tablet 5-10 mg, 5-10 mg, Oral, Q8H PRN **OR** metoCLOPramide (REGLAN) injection 5-10 mg, 5-10 mg, Intravenous, Q8H PRN, Kathryne Hitch, MD . ondansetron [facility]) tablet 4 mg, 4 mg, Oral, Q6H PRN **OR** ondansetron (ZOFRAN) injection 4 mg, 4 mg, Intravenous, Q6H PRN, Kathryne Hitch, MD . oxyCODONE-acetaminophen (PERCOCET/ROXICET) 5-325 MG per tablet 1 tablet, 1 tablet, Oral, Q4H PRN, Lorretta Harp, MD, 1 tablet at 11/14/15 2133 . polyethylene glycol (MIRALAX / GLYCOLAX) packet 17 g, 17 g, Oral, BID, Richarda Overlie, MD, 17 g at 11/15/15 2206 . senna-docusate (Senokot-S) tablet 1 tablet, 1 tablet, Oral, QHS PRN, Lorretta Harp, MD . Vitamin D (Ergocalciferol) (DRISDOL) capsule 50,000 Units, 50,000 Units, Oral, Weekly, Lorretta Harp, MD . warfarin (COUMADIN) tablet 3 mg, 3 mg, Oral, Once per day on Sun Tue Wed Thu Fri Sat, Haley P Baird, [facility], 3 mg at 11/15/15 1749 . [START ON 11/20/2015] warfarin (COUMADIN) tablet 4 mg, 4 mg, Oral, Q Mon-1800, Almon Hercules, RPH . Warfarin - Pharmacist Dosing Inpatient, , Does not apply, q1800, Almon Hercules, [facility], 1 each at 11/15/15 1749  Patients Current Diet: Diet NPO time specified Diet - low sodium heart healthy  Precautions / Restrictions Precautions Precautions: Fall Restrictions Weight Bearing Restrictions: Yes LLE Weight Bearing: Weight bearing as tolerated   Has the patient had 2 or more falls or a fall with injury in the past year?Yes. Reports 2-3 falls with the latest fall resulting in a fracture.  Prior Activity Level Limited Community (1-2x/wk): Went out 3-4 X a week, was driving  Journalist, newspaper / Equipment Home Assistive Devices/Equipment: Eyeglasses, Dentures (specify type), Grab bars around toilet, Grab bars  in shower, Hand-held shower hose,  Built-in shower seat Home Equipment: Grab bars - tub/shower, Shower seat - built in (Possibly a walker)  Prior Device Use: Indicate devices/aids used by the patient prior to current illness, exacerbation or injury? None  Prior Functional Level Prior Function Level of Independence: Independent Comments: Still drives, cooking./cleaning, shopping  Self Care: Did the patient need help bathing, dressing, using the toilet or eating? Independent  Indoor Mobility: Did the patient need assistance with walking from room to room (with or without device)? Independent  Stairs: Did the patient need assistance with internal or external stairs (with or without device)? Independent  Functional Cognition: Did the patient need help planning regular tasks such as shopping or remembering to take medications? Independent  Current Functional Level Cognition  Overall Cognitive Status: Within Functional Limits for tasks assessed Orientation Level: Oriented X4   Extremity Assessment (includes Sensation/Coordination)  Upper Extremity Assessment: Generalized weakness  Lower Extremity Assessment: Defer to PT evaluation LLE Deficits / Details: Acute pain and decreased strength/AROM consistent with above mentioned procedure.     ADLs  Overall ADL's : Needs assistance/impaired Grooming: Moderate assistance, Sitting Upper Body Bathing: Moderate assistance, Sitting Lower Body Bathing: Maximal assistance, +2 for physical assistance, Sit to/from stand Upper Body Dressing : Moderate assistance, Sitting Upper Body Dressing Details (indicate cue type and reason): Assist for balance and donning gown. Lower Body Dressing: Maximal assistance, +2 for physical assistance, Sit to/from stand Toilet Transfer: Ambulation, BSC, RW, Minimal assistance (from high surface commode; mod from bed and regular chair) General ADL Comments: ambulated to chair in room and rested. Plan was to  stand at sink for grooming, but pt needed to use commode so performed SPT to this. Pt returned to chair to complete grooming task. HR 90-126.     Mobility  Overal bed mobility: Needs Assistance Bed Mobility: Supine to Sit Supine to sit: Min assist, HOB elevated General bed mobility comments: use of rail. Cues for sequence    Transfers  Overall transfer level: Needs assistance Equipment used: Rolling walker (2 wheeled) Transfers: Sit to/from Stand Sit to Stand: Min assist, Mod assist, +2 physical assistance (min from high commode; mod from bed and chair) Stand pivot transfers: Mod assist, +2 physical assistance General transfer comment: assist to rise and stabilize. cues for UE placement    Ambulation / Gait / Stairs / Wheelchair Mobility  Ambulation/Gait Ambulation/Gait assistance: Min assist, +2 safety/equipment Ambulation Distance (Feet): 10 Feet (x2) Assistive device: Rolling walker (2 wheeled) Gait Pattern/deviations: Step-through pattern, Decreased stride length, Trunk flexed General Gait Details: Cues for sequencing and more upright posture. pt needed close chair due to fatiguing easily.  Gait velocity: Decreased Gait velocity interpretation: Below normal speed for age/gender    Posture / Balance Dynamic Sitting Balance Sitting balance - Comments: holding onto rail can support herself, but unable to sit without holding on Balance Overall balance assessment: Needs assistance Sitting-balance support: Single extremity supported, Feet supported Sitting balance-Leahy Scale: Poor Sitting balance - Comments: holding onto rail can support herself, but unable to sit without holding on Postural control: Posterior lean Standing balance support: Bilateral upper extremity supported, During functional activity Standing balance-Leahy Scale: Poor Standing balance comment: UE support for balance    Special needs/care consideration BiPAP/CPAP No CPM No Continuous  Drip IV KVO Dialysis No  Life Vest No Oxygen Has 02 2L  in hospital Special Bed No Trach Size No Wound Vac (area) No  Skin Left hip incision  Bowel mgmt: Last BM 11/10/15 Bladder mgmt: some urinary incontinence  Diabetic mgmt No    Previous Home Environment Living Arrangements: Spouse/significant other Available Help at Discharge: Family, Available 24 hours/day Type of Home: House Home Layout: One level Home Access: Stairs to enter Entrance Stairs-Rails: Left Entrance Stairs-Number of Steps: 2 Bathroom Shower/Tub: Health visitor: Handicapped height Bathroom Accessibility: Yes Home Care Services: No  Discharge Living Setting Plans for Discharge Living Setting: House, Lives with (comment) (Lives with husband.) Type of Home at Discharge: House Discharge Home Layout: One level Discharge Home Access: Stairs to enter Entrance Stairs-Number of Steps: 2 at the front and 1 at the garage entry Does the patient have any problems obtaining your medications?: No  Social/Family/Support Systems Patient Roles: Spouse, Parent (Has a husband and 2 sons.) Contact Information: Bridgitte Felicetti - husband Anticipated Caregiver: husband Anticipated Caregiver's Contact Information: Thurmond Butts - husband (h) [phone] (c) [phone] Ability/Limitations of Caregiver: Husband can assist. Sons live 30 minutes away. Caregiver Availability: 24/7 Discharge Plan Discussed with Primary Caregiver: Yes Is Caregiver In Agreement with Plan?: Yes Does Caregiver/Family have Issues with Lodging/Transportation while Pt is in Rehab?: No  Goals/Additional Needs Patient/Family Goal for Rehab: PT/OT supervision and min assist goals Expected length of stay: 10-14 days Cultural Considerations: [employer] holiness Dietary Needs: Low Sodium heart healthy diet Equipment Needs: TBD Pt/Family Agrees to Admission and willing to participate: Yes Program Orientation  Provided & Reviewed with Pt/Caregiver Including Roles & Responsibilities: Yes  Decrease burden of Care through IP rehab admission: N/A  Possible need for SNF placement upon discharge: Not planned  Patient Condition: This patient's medical and functional status has changed since the consult dated: 11/13/15 in which the Rehabilitation Physician determined and documented that the patient's condition is appropriate for intensive rehabilitative care in an inpatient rehabilitation facility. See "History of Present Illness" (above) for medical update. Functional changes are: Currently requiring min assist to ambulate 10 feet RW. Patient's medical and functional status update has been discussed with the Rehabilitation physician and patient remains appropriate for inpatient rehabilitation. Will admit to inpatient rehab today.  Preadmission Screen Completed By: Trish Mage, 11/16/2015 3:03 PM ______________________________________________________________________  Discussed status with Dr. Wynn Banker on 11/16/15 at 1502 and received telephone approval for admission today.  Admission Coordinator: Trish Mage, time1502/Date05/11/17          Cosigned by: Erick Colace, MD at 11/16/2015 3:04 PM  Revision History     Date/Time User Provider Type Action   11/16/2015 3:04 PM Erick Colace, MD Physician Cosign   11/16/2015 3:03 PM Trish Mage, RN Rehab Admission Coordinator Sign

## 2015-11-20 NOTE — Progress Notes (Signed)
Physical Therapy Session Note  Patient Details  Name: Kendra Hughes MRN: [id number] Date of Birth: [birth year]  Today's Date: 11/20/2015 PT Individual Time: 1305-1401 PT Individual Time Calculation (min): 56 min   Short Term Goals: Week 1:  PT Short Term Goal 1 (Week 1): LTG = STG due to estimated D/C date.   Skilled Therapeutic Interventions/Progress Updates:    Patient seen with focus of session on walking endurance, balance and transfers.  Patient in bathroom upon my entry; assisted with balance during hygiene and clothing management.  Patient ambulated to therapy gym after seated rest, with wheelchair close for safety and minguard A with RW.  Patient negotiated 4 steps with rails and min A appropriate with sequence no cues until last step then needed cues for sequence due to stepping down with R foot.  Patient rested with HR 140 until back down to 100.  Then sit <> stand from mat with min UE support and cues for technique for improved anterior weight shift.  In parallel bars practiced balance activities as noted below.  Patient ambulated back to room with RW and one standing rest break minguard A to supervision.  Noted fatigue at end of session so returned to supine in bed with min A and mod cues for technique for positioning in bed and for legs into bed.  Activated bed alarm and call bell in reach.   Therapy Documentation Precautions:  Precautions Precautions: Fall Restrictions Weight Bearing Restrictions: Yes LLE Weight Bearing: Weight bearing as tolerated Pain: Pain Assessment Pain Assessment: No/denies pain Pain Score: 5  Pain Type: Surgical pain Pain Location: Hip Pain Orientation: Left Pain Descriptors / Indicators: Sore Pain Onset: With Activity Pain Intervention(s): Repositioned;Rest;Ambulation/increased activity     Balance: Static Standing Balance Static Standing - Balance Support: No upper extremity supported Static Standing - Level of Assistance: 5: Stand by  assistance Static Standing - Comment/# of Minutes: 30 sec feet apart, then with feet slightly under shoulder width 30 sec touching rail x 1, then with head turns x 10, then with twisting to look over shoulder on each side x 3 Dynamic Standing Balance Dynamic Standing - Balance Support: Bilateral upper extremity supported Dynamic Standing - Level of Assistance: 4: Min assist Dynamic Standing - Comments: side stepping, tandem walking Exercises: General Exercises - Lower Extremity Repetitive Sit to Stands: Other (comment);Two upper extremities (5 reps) Repetitive Sit to Stands Level of Assist: Min Repetitive Sit to Stands Surface Height: about 19"   See Function Navigator for Current Functional Status.   Therapy/Group: Individual Therapy  Elray Mcgregor  [place], [place] [phone] 11/20/2015  11/20/2015, 2:42 PM

## 2015-11-20 NOTE — Progress Notes (Signed)
Occupational Therapy Session Note  Patient Details  Name: ASHAUNA BERTHOLF MRN: [id number] Date of Birth: [birth year]  Today's Date: 11/20/2015 OT Individual Time: 1045-1200 OT Individual Time Calculation (min): 75 min    Short Term Goals: Week 1:  OT Short Term Goal 1 (Week 1): STG=LTG d/t short anticipated LOS  Skilled Therapeutic Interventions/Progress Updates: ADL-retraining with focus on sit<>stand, functional mobility using RW, transfers (toilet, bed), adapted bathing/dressing using AE (reacher, sock aid), and bed mobility.   Pt received seated in w/c awaiting therapist for assist with bathing.   Pt request to toilet and is aware of precaution to not disturb left hip dressing (no showering advised).    Pt rose from w/c with min guard and ambulated to [facility] over toilet to void urine and perform hygiene.   While seated on BSC, pt elected to proceed with BADL and required only setup to bring clothing and supplies (pan bath).    Pt educated on use of reacher and sock aid but declined need for LH sponge or shoe horn.   Pt required instructional cues and min assist to don pants.   Pt able to initiate use of sock aid but deferred socks when offered her shoes (slip-on style).   After grooming at sink, sitting and standing, with min guard, pt was escorted to ADL apt and instructed on bed mobility using bed to assist with lifting her left leg while pt performed posterior weight shifts upright (sitting position) versus supine.    Pt ambulated back to her room with her RW and was assisted to recliner to elevate her leg while setup for lunch.     Therapy Documentation Precautions:  Precautions Precautions: Fall Restrictions Weight Bearing Restrictions: Yes LLE Weight Bearing: Weight bearing as tolerated   Pain: Pain Assessment Pain Assessment: No/denies pain Pain Score: 5  Pain Type: Surgical pain Pain Location: Hip Pain Orientation: Left Pain Descriptors / Indicators: Sore Pain Onset: With  Activity Pain Intervention(s): Repositioned;Rest;Ambulation/increased activity  ADL: ADL ADL Comments: See Functional Assessment Tool  See Function Navigator for Current Functional Status.   Therapy/Group: Individual Therapy  Laakea Pereira 11/20/2015, 2:39 PM

## 2015-11-20 NOTE — Progress Notes (Signed)
Ankit Karis Juba, MD Physician Signed Physical Medicine and Rehabilitation Consult Note 11/13/2015 10:15 AM  Related encounter: Admission (Discharged) from 11/10/2015 in [facility] 2W CARDIAC UNIT    Expand All Collapse All        Physical Medicine and Rehabilitation Consult Reason for Consult: Left displaced femoral neck fracture Referring Physician: [facility]   HPI: Kendra Hughes is a 82 y.o. right handed female with history of hypertension, chronic atrial fibrillation maintained on Coumadin, diastolic congestive heart failure, vitamin D deficiency. Patient lives with spouse independent prior to admission. She still drives. One level home with 2 steps to entry. Presented 11/10/2015 after mechanical fall and presented to [facility]. X-rays and imaging revealed left hip displaced femoral neck fracture. She was transferred to [facility] and underwent left hip bipolar hemiarthroplasty through direct anterior approach 11/11/2015 per Dr. Magnus Ivan. Hospital course pain management. Weightbearing as tolerated. Chronic Coumadin resumed with intravenous heparin for bridging. Acute blood loss anemia 9.0 and monitor. Physical occupational therapy evaluation completed 11/12/2015 with recommendations of physical medicine rehabilitation consult.  Review of Systems  Constitutional: Negative for fever and chills.  HENT: Negative for hearing loss.  Eyes: Negative for blurred vision and double vision.  Respiratory: Negative for cough.  Cardiovascular: Positive for leg swelling. Negative for chest pain and palpitations.  Gastrointestinal: Positive for constipation. Negative for nausea and vomiting.  Genitourinary: Negative for dysuria and hematuria.  Musculoskeletal: Positive for myalgias, joint pain and falls.  Skin: Negative for rash.  Neurological: Negative for seizures, weakness and headaches.  All other systems reviewed and are negative.  Past Medical History  Diagnosis  Date  . PAF (paroxysmal atrial fibrillation) (HCC)   . Hypertension   . Dyslipidemia   . DOE (dyspnea on exertion)   . History of nuclear stress test 10/2009    normal exam  . Chronic diastolic (congestive) heart failure (HCC)   . DVT (deep venous thrombosis) (HCC) "years ago"  . Fracture of left hip (HCC) 11/09/2015    "fell"  . Arthritis     "right knee" (11/10/2015)  . Rheumatoid arthritis (HCC) 1970    "it's in remission" (11/10/2015)   Past Surgical History  Procedure Laterality Date  . Abdominal hysterectomy  1996  . Breast biopsy Right 1984    "benign"  . Transthoracic echocardiogram  07/22/2012    EF 55-60%; mild AV regurg; mild MR; mild-mod TR  . Tee without cardioversion N/A 11/01/2013    Procedure: TRANSESOPHAGEAL ECHOCARDIOGRAM (TEE); Surgeon: Chrystie Nose, MD; Location: [facility] ENDOSCOPY; Service: Cardiovascular; Laterality: N/A;  . Cardioversion N/A 11/01/2013    Procedure: CARDIOVERSION; Surgeon: Chrystie Nose, MD; Location: [facility] ENDOSCOPY; Service: Cardiovascular; Laterality: N/A;  . Tonsillectomy    . Cataract extraction w/ intraocular lens implant, bilateral Bilateral 2003  . Bladder suspension  X 2  . Rectocele repair     Family History  Problem Relation Age of Onset  . Heart disease Mother    Social History:  reports that she has never smoked. She has never used smokeless tobacco. She reports that she does not drink alcohol or use illicit drugs. Allergies: No Known Allergies Medications Prior to Admission  Medication Sig Dispense Refill  . atorvastatin (LIPITOR) 20 MG tablet Take 20 mg by mouth daily.    . cholecalciferol (VITAMIN D) 1000 units tablet Take 1,000 Units by mouth daily.    [name] diltiazem (CARDIZEM CD) 120 MG 24 hr capsule Take 1 capsule (120 mg total) by mouth daily.  90 capsule 3  . Fexofenadine HCl (ALLEGRA PO) Take 1  tablet by mouth as needed (allergies).     . Fluticasone Propionate (FLONASE NA) Place 1 spray into the nose as needed (allergies).     . folic acid (FOLVITE) 800 MCG tablet Take 800 mcg by mouth daily.    . furosemide (LASIX) 40 MG tablet Take 1 tablet (40 mg total) by mouth daily. 30 tablet 3  . metoprolol (LOPRESSOR) 50 MG tablet Take 1 tablet (50 mg total) by mouth 2 (two) times daily. 180 tablet 3  . warfarin (COUMADIN) 1 MG tablet Take 1 mg by mouth once a week. Monday    . warfarin (COUMADIN) 3 MG tablet Take 3 mg by mouth daily.       Home: Home Living Family/patient expects to be discharged to:: Private residence Living Arrangements: Spouse/significant other Available Help at Discharge: Family, Available 24 hours/day Type of Home: House Home Access: Stairs to enter [employer] of Steps: 2 Entrance Stairs-Rails: Left Home Layout: One level Bathroom Shower/Tub: Health visitor: Handicapped height Bathroom Accessibility: Yes Home Equipment: Grab bars - tub/shower, Shower seat - built in (Possibly a walker)  Functional History: Prior Function Level of Independence: Independent Comments: Still drives, cooking./cleaning, shopping Functional Status:  Mobility: Bed Mobility Overal bed mobility: Needs Assistance, +2 for physical assistance Bed Mobility: Supine to Sit Supine to sit: Max assist, +2 for physical assistance General bed mobility comments: Bed pad used for assist with scooting. Pt required cues for sequencing and safety with transitioning to full sitting position. Assist required initially to maintain sitting balance but pt able to progress to sitting up with BUE support. Total assist to return to supine.  Transfers Overall transfer level: Needs assistance Equipment used: Rolling walker (2 wheeled) Transfers: Sit to/from Stand Sit to Stand: Max assist, +2 physical assistance General transfer comment: Assist  to power-up to full standing position and gain/maintain balance with RW. Pt was not able to achieve a full upright position due to pain.  Ambulation/Gait General Gait Details: Unable at this time.     ADL: ADL Overall ADL's : Needs assistance/impaired Grooming: Moderate assistance, Sitting Upper Body Bathing: Moderate assistance, Sitting Lower Body Bathing: Maximal assistance, +2 for physical assistance, Sit to/from stand Upper Body Dressing : Moderate assistance, Sitting Upper Body Dressing Details (indicate cue type and reason): Assist for balance and donning gown. Lower Body Dressing: Maximal assistance, +2 for physical assistance, Sit to/from stand General ADL Comments: Limited eval secondary to increased HR, decreased BP, and decreased SpO2. Following sit to stand from EOB x1 pt with decreased responsiveness and returned to supine: HR=160, BP=92/54, SpO2=88% on RA.  Cognition: Cognition Overall Cognitive Status: Within Functional Limits for tasks assessed Orientation Level: Oriented X4 Cognition Arousal/Alertness: Awake/alert Behavior During Therapy: WFL for tasks assessed/performed Overall Cognitive Status: Within Functional Limits for tasks assessed  Blood pressure 97/41, pulse 117, temperature 98.9 F (37.2 C), temperature source Oral, resp. rate 16, height 5\' 5"  (1.651 m), weight 70.308 kg (155 lb), SpO2 96 %. Physical Exam  Vitals reviewed. Constitutional: She is oriented to person, place, and time. She appears well-developed and well-nourished.  HENT:  Head: Normocephalic and atraumatic.  Eyes: Conjunctivae and EOM are normal.  Neck: Normal range of motion. Neck supple. No thyromegaly present.  Cardiovascular:  Irregularly irregular  Respiratory: Effort normal and breath sounds normal. No respiratory distress.  GI: Soft. Bowel sounds are normal. She exhibits no distension.  Musculoskeletal: She exhibits edema and tenderness.  Neurological: She is alert and oriented  to person, place, and time.  Sensation intact light touch Motor: Bilateral upper extremities: 4+/5 proximal to distal Right lower extremity: 4+/5 excellent to distal Left lower extremity hip flexion 2/5, ankle dorsi/plantar flexion 4+/5  Skin: Skin is warm and dry.  Hip incision clean and dry. Appropriately tender  Psychiatric: She has a normal mood and affect. Her behavior is normal.     Lab Results Last 24 Hours    Results for orders placed or performed during the hospital encounter of 11/10/15 (from the past 24 hour(s))  CBC Status: Abnormal   Collection Time: 11/12/15 1:54 PM  Result Value Ref Range   WBC 8.6 4.0 - 10.5 K/uL   RBC 3.62 (L) 3.87 - 5.11 MIL/uL   Hemoglobin 10.8 (L) 12.0 - 15.0 g/dL   HCT 17.7 (L) 93.9 - 03.0 %   MCV 92.0 78.0 - 100.0 fL   MCH 29.8 26.0 - 34.0 pg   MCHC 32.4 30.0 - 36.0 g/dL   RDW 09.2 33.0 - 07.6 %   Platelets 212 150 - 400 K/uL  TSH Status: None   Collection Time: 11/12/15 6:00 PM  Result Value Ref Range   TSH 3.320 0.350 - 4.500 uIU/mL  T4, free Status: None   Collection Time: 11/12/15 6:00 PM  Result Value Ref Range   Free T4 1.04 0.61 - 1.12 ng/dL  Troponin I (q 6hr x 3) Status: None   Collection Time: 11/12/15 6:00 PM  Result Value Ref Range   Troponin I 0.03 <0.031 ng/mL  Urinalysis, Routine w reflex microscopic (not at [facility]) Status: Abnormal   Collection Time: 11/12/15 7:49 PM  Result Value Ref Range   Color, Urine YELLOW YELLOW   APPearance CLEAR CLEAR   Specific Gravity, Urine 1.024 1.005 - 1.030   pH 5.5 5.0 - 8.0   Glucose, UA NEGATIVE NEGATIVE mg/dL   Hgb urine dipstick LARGE (A) NEGATIVE   Bilirubin Urine NEGATIVE NEGATIVE   Ketones, ur NEGATIVE NEGATIVE mg/dL   Protein, ur 226 (A) NEGATIVE mg/dL   Nitrite NEGATIVE NEGATIVE   Leukocytes, UA NEGATIVE NEGATIVE  Urine  microscopic-add on Status: Abnormal   Collection Time: 11/12/15 7:49 PM  Result Value Ref Range   Squamous Epithelial / LPF 0-5 (A) NONE SEEN   WBC, UA 6-30 0 - 5 WBC/hpf   RBC / HPF 6-30 0 - 5 RBC/hpf   Bacteria, UA RARE (A) NONE SEEN  Brain natriuretic peptide Status: Abnormal   Collection Time: 11/12/15 9:03 PM  Result Value Ref Range   B Natriuretic Peptide 457.9 (H) 0.0 - 100.0 pg/mL  Troponin I (q 6hr x 3) Status: Abnormal   Collection Time: 11/12/15 11:18 PM  Result Value Ref Range   Troponin I 0.05 (H) <0.031 ng/mL  Heparin level (unfractionated) Status: None   Collection Time: 11/13/15 1:59 AM  Result Value Ref Range   Heparin Unfractionated 0.42 0.30 - 0.70 IU/mL  Protime-INR Status: Abnormal   Collection Time: 11/13/15 5:06 AM  Result Value Ref Range   Prothrombin Time 19.5 (H) 11.6 - 15.2 seconds   INR 1.64 (H) 0.00 - 1.49  CBC Status: Abnormal   Collection Time: 11/13/15 5:06 AM  Result Value Ref Range   WBC 8.9 4.0 - 10.5 K/uL   RBC 3.08 (L) 3.87 - 5.11 MIL/uL   Hemoglobin 9.0 (L) 12.0 - 15.0 g/dL   HCT 33.3 (L) 54.5 - 62.5 %   MCV 91.2 78.0 - 100.0 fL  MCH 29.2 26.0 - 34.0 pg   MCHC 32.0 30.0 - 36.0 g/dL   RDW 04.8 88.9 - 16.9 %   Platelets 178 150 - 400 K/uL  Basic metabolic panel Status: Abnormal   Collection Time: 11/13/15 5:06 AM  Result Value Ref Range   Sodium 132 (L) 135 - 145 mmol/L   Potassium 3.8 3.5 - 5.1 mmol/L   Chloride 98 (L) 101 - 111 mmol/L   CO2 26 22 - 32 mmol/L   Glucose, Bld 113 (H) 65 - 99 mg/dL   BUN 16 6 - 20 mg/dL   Creatinine, Ser 4.50 0.44 - 1.00 mg/dL   Calcium 8.0 (L) 8.9 - 10.3 mg/dL   GFR calc non Af Amer >60 >60 mL/min   GFR calc Af Amer >60 >60 mL/min   Anion gap 8 5 - 15  Troponin I (q 6hr x 3) Status: Abnormal    Collection Time: 11/13/15 5:06 AM  Result Value Ref Range   Troponin I 0.05 (H) <0.031 ng/mL      Imaging Results (Last 48 hours)    Dg Chest Port 1 View  11/12/2015 CLINICAL DATA: 82 year old female with tachycardia and AFib EXAM: PORTABLE CHEST 1 VIEW COMPARISON: Chest radiograph dated 08/19/2013 and CT dated 10/07/2009 FINDINGS: Single portable view of the chest demonstrates emphysematous changes of the lungs with areas of stable scarring at the apices. There is no focal consolidation. There is mild blunting of the left costophrenic angle which may represent a small left pleural effusion. There is no pneumothorax with stable mild cardiomegaly. No acute osseous pathology identified. IMPRESSION: Emphysema with probable small left pleural effusion. No focal consolidation or pneumothorax. Electronically Signed By: Elgie Collard M.D. On: 11/12/2015 18:47     Assessment/Plan: Diagnosis: Left displaced femoral neck fracture Labs and images independently reviewed. Records reviewed and summated above. Oral pharmacological pain control  Bowel program: consider colace, miralax and/or Senna. PRN suppository Fall precautions Monitor surgical wound and skin especially over pressure sensitive areas Prevent immobility complications: pressure ulcers, contractures, HO Consider modalities, such as TENs  1. Does the need for close, 24 hr/day medical supervision in concert with the patient's rehab needs make it unreasonable for this patient to be served in a less intensive setting? Yes  2. Co-Morbidities requiring supervision/potential complications: ABLA (transfuse if necessary to ensure appropriate perfusion for increased activity tolerance), pain management (Biofeedback training with therapies to help reduce reliance on opiate pain medications, monitor pain control during therapies, and sedation at rest and titrate to maximum efficacy to ensure participation and gains in therapies),  hypertension, currently with hypotension (monitor and provide prns in accordance with increased physical exertion and pain), chronic atrial fibrillation (continue meds, monitor heart rate with increased physical activity), diastolic congestive heart failure (monitor weights), vitamin D deficiency (supplementation), hyponatremia (continue to monitor, treat if necessary) 3. Due to bladder management, safety, skin/wound care, disease management, pain management and patient education, does the patient require 24 hr/day rehab nursing? Yes 4. Does the patient require coordinated care of a physician, rehab nurse, PT (1-2 hrs/day, 5 days/week) and OT (1-2 hrs/day, 5 days/week) to address physical and functional deficits in the context of the above medical diagnosis(es)? Yes Addressing deficits in the following areas: balance, endurance, locomotion, strength, transferring, bowel/bladder control, bathing, dressing, toileting and psychosocial support 5. Can the patient actively participate in an intensive therapy program of at least 3 hrs of therapy per day at least 5 days per week? Yes 6. The potential for patient to  make measurable gains while on inpatient rehab is excellent 7. Anticipated functional outcomes upon discharge from inpatient rehab are supervision and min assist with PT, supervision and min assist with OT, n/a with SLP. 8. Estimated rehab length of stay to reach the above functional goals is: 10-14 days. 9. Does the patient have adequate social supports and living environment to accommodate these discharge functional goals? Yes 10. Anticipated D/C setting: Home 11. Anticipated post D/C treatments: HH therapy and Home excercise program 12. Overall Rehab/Functional Prognosis: excellent  RECOMMENDATIONS: This patient's condition is appropriate for continued rehabilitative care in the following setting: CIR Patient has agreed to participate in recommended program. Yes Note that insurance prior  authorization may be required for reimbursement for recommended care.  Comment: Rehab Admissions Coordinator to follow up.  Maryla Morrow, MD 11/13/2015       Revision History     Date/Time User Provider Type Action   11/13/2015 2:46 PM Ankit Karis Juba, MD Physician Sign   11/13/2015 10:34 AM Charlton Amor, PA-C Physician Assistant Pend   View Details Report       Routing History     Date/Time From To Method   11/13/2015 2:46 PM Ankit Karis Juba, MD Carmin Richmond, MD Fax

## 2015-11-20 NOTE — Progress Notes (Signed)
Occupational Therapy Session Note  Patient Details  Name: Kendra Hughes MRN: [id number] Date of Birth: 1923-03-10  Today's Date: 11/20/2015 OT Individual Time: 6203-5597 OT Individual Time Calculation (min): 60 min    Skilled Therapeutic Interventions/Progress Updates: Patient in bed resting upon approach for therapy and stated the first item on her agenda was to know tomorrow's schedule and talk to her husband on son and set up a time for them to visit tomorrow between therapy sessions.   So, her first priority was granted as this clinician helped her write down her therapy schedule for tomorrow, and then she called and spoke with her son.  Patient stated, "I think if my heart rate did not vary so much, I would be home by now."   Heart rate after transferring  from supine with head of bed elevated to EOB=132 then decreased to 100 and fluctuated back and forth for a few minutes.   Patient stated she felt fine and without bothersome symptoms.  (her BP was 121/52 and her oxygen saturation =100 & patient stated her diastolic pressure is typically 50-62)  She also utilized her walker to transfer to toilet via walker with distant Supervision, and completed toileting sit to stand and including clothing management with supervision and extra time.  She completed functional mobility in room via walker to stand dynamic to comb hair and standing to extract clothing from drawers in preparation for dressing tomorrow morning with fair dynamic standing balance.      Patient was able to set up her dinner tray and complete self feeding with distant supervision once the tray table was in place.  Patient was left eating with call bell and cell phone in place at the end of the session.  Therapy Documentation Precautions:  Precautions Precautions: Fall Restrictions Weight Bearing Restrictions: Yes LLE Weight Bearing: Weight bearing as tolerated    Vital Signs:heart rate fluctuated 132-100 at beginning and  end ofsession once seated;   BP=121/52 and oxygen=100  Pain: denied  See Function Navigator for Current Functional Status.   Therapy/Group: Individual Therapy  Bud Face [facility] 11/20/2015, 4:59 PM

## 2015-11-20 NOTE — Progress Notes (Signed)
ANTICOAGULATION CONSULT NOTE - Follow Up Consult  Pharmacy Consult for coumadin Indication: atrial fibrillation  No Known Allergies  Patient Measurements: Height: 5\' 5"  (165.1 cm) Weight: 166 lb 3.6 oz (75.4 kg) IBW/kg (Calculated) : 57 Heparin Dosing Weight:   Vital Signs: Temp: 98 F (36.7 C) (05/15 0611) Temp Source: Oral (05/15 0611) BP: 104/63 mmHg (05/15 0611) Pulse Rate: 84 (05/15 0611)  Labs:  Recent Labs  11/18/15 0613 11/19/15 0440 11/20/15 0421  LABPROT 24.8* 26.3* 28.5*  INR 2.27* 2.45* 2.73*    Estimated Creatinine Clearance: 47.5 mL/min (by C-G formula based on Cr of 0.67).   Medications:  Scheduled:  . amiodarone  400 mg Oral BID  . atorvastatin  10 mg Oral q1800  . diclofenac sodium  2 g Topical QID  . diltiazem  180 mg Oral Daily  . fluticasone  1 spray Each Nare Daily  . folic acid  1 mg Oral Daily  . furosemide  20 mg Oral Daily  . loratadine  10 mg Oral Daily  . protein supplement  1 scoop Oral TID WC  . sulfamethoxazole-trimethoprim  1 tablet Oral Q12H  . tamsulosin  0.4 mg Oral QPC supper  . Vitamin D (Ergocalciferol)  50,000 Units Oral Weekly  . warfarin  3 mg Oral Once per day on Sun Tue Wed Thu Fri Sat  . warfarin  4 mg Oral Q Mon-1800  . Warfarin - Pharmacist Dosing Inpatient   Does not apply q1800   Infusions:    Assessment: 82 yo female with afib is currently on therapeutic coumadin but INR is up to 2.73 from 2.45.  Bactrim was added yesterday.  Goal of Therapy:  INR 2-3 Monitor platelets by anticoagulation protocol: Yes   Plan:   - d/c standing order of coumadin and coumadin 2 mg po x1   Daily PT/INR   Azalie Harbeck, Tsz-Yin 11/20/2015,8:11 AM

## 2015-11-21 ENCOUNTER — Inpatient Hospital Stay (HOSPITAL_COMMUNITY): Payer: Medicare Other | Admitting: Physical Therapy

## 2015-11-21 ENCOUNTER — Inpatient Hospital Stay (HOSPITAL_COMMUNITY): Payer: Medicare Other

## 2015-11-21 DIAGNOSIS — R251 Tremor, unspecified: Secondary | ICD-10-CM

## 2015-11-21 DIAGNOSIS — I5033 Acute on chronic diastolic (congestive) heart failure: Secondary | ICD-10-CM

## 2015-11-21 LAB — CBC WITH DIFFERENTIAL/PLATELET
BASOS PCT: 0 %
Basophils Absolute: 0 10*3/uL (ref 0.0–0.1)
Eosinophils Absolute: 0.1 10*3/uL (ref 0.0–0.7)
Eosinophils Relative: 2 %
HEMATOCRIT: 28.9 % — AB (ref 36.0–46.0)
Hemoglobin: 9.6 g/dL — ABNORMAL LOW (ref 12.0–15.0)
LYMPHS ABS: 0.9 10*3/uL (ref 0.7–4.0)
Lymphocytes Relative: 11 %
MCH: 29.9 pg (ref 26.0–34.0)
MCHC: 33.2 g/dL (ref 30.0–36.0)
MCV: 90 fL (ref 78.0–100.0)
MONO ABS: 0.7 10*3/uL (ref 0.1–1.0)
MONOS PCT: 9 %
NEUTROS ABS: 6.1 10*3/uL (ref 1.7–7.7)
Neutrophils Relative %: 78 %
PLATELETS: 463 10*3/uL — AB (ref 150–400)
RBC: 3.21 MIL/uL — ABNORMAL LOW (ref 3.87–5.11)
RDW: 15.4 % (ref 11.5–15.5)
WBC: 7.7 10*3/uL (ref 4.0–10.5)

## 2015-11-21 LAB — BASIC METABOLIC PANEL
ANION GAP: 10 (ref 5–15)
BUN: 15 mg/dL (ref 6–20)
CALCIUM: 8.8 mg/dL — AB (ref 8.9–10.3)
CO2: 29 mmol/L (ref 22–32)
CREATININE: 0.73 mg/dL (ref 0.44–1.00)
Chloride: 95 mmol/L — ABNORMAL LOW (ref 101–111)
GFR calc Af Amer: >60 mL/min (ref >60)
GFR calc non Af Amer: >60 mL/min (ref >60)
GLUCOSE: 103 mg/dL — AB (ref 65–99)
Potassium: 3.6 mmol/L (ref 3.5–5.1)
Sodium: 134 mmol/L — ABNORMAL LOW (ref 135–145)

## 2015-11-21 LAB — PROTIME-INR
INR: 3.33 — ABNORMAL HIGH (ref 0.00–1.49)
Prothrombin Time: 33.1 seconds — ABNORMAL HIGH (ref 11.6–15.2)

## 2015-11-21 LAB — BRAIN NATRIURETIC PEPTIDE: B NATRIURETIC PEPTIDE 5: 136.7 pg/mL — AB (ref 0.0–100.0)

## 2015-11-21 MED ORDER — FUROSEMIDE 40 MG PO TABS
40.0000 mg | ORAL_TABLET | Freq: Every day | ORAL | Status: DC
Start: 2015-11-22 — End: 2015-11-25
  Administered 2015-11-22 – 2015-11-24 (×3): 40 mg via ORAL
  Filled 2015-11-21 (×4): qty 1

## 2015-11-21 NOTE — Progress Notes (Signed)
[facility]     PROGRESS NOTE  Subjective/Complaints:  Patient sitting up in her recliner. She states she is not sleeping well as a function of being in the hospital. She has concerns about tremors.  ROS:    + Small Tender papule right dorsal wrist, tremors. Denies CP, SOB, N/V/D.  Objective: Vital Signs: Blood pressure 111/56, pulse 100, temperature 97.6 F (36.4 C), temperature source Oral, resp. rate 19, height 5\' 5"  (1.651 m), weight 71.26 kg (157 lb 1.6 oz), SpO2 96 %. No results found.  Recent Labs  11/21/15 0423  WBC 7.7  HGB 9.6*  HCT 28.9*  PLT 463*    Recent Labs  11/21/15 0423  NA 134*  K 3.6  CL 95*  GLUCOSE 103*  BUN 15  CREATININE 0.73  CALCIUM 8.8*   CBG (last 3)  No results for input(s): GLUCAP in the last 72 hours.  Wt Readings from Last 3 Encounters:  11/21/15 71.26 kg (157 lb 1.6 oz)  11/10/15 70.308 kg (155 lb)  08/21/15 70.67 kg (155 lb 12.8 oz)    Physical Exam:  BP 111/56 mmHg  Pulse 100  Temp(Src) 97.6 F (36.4 C) (Oral)  Resp 19  Ht 5\' 5"  (1.651 m)  Wt 71.26 kg (157 lb 1.6 oz)  BMI 26.14 kg/m2  SpO2 96% Constitutional: She appears well-developed and well-nourished. NAD. HENT: Normocephalic and atraumatic.  Eyes: Conjunctivae and EOM are normal.  Cardiovascular: Irregularly irregular  Respiratory: Effort normal and breath sounds normal. No respiratory distress.  GI: Soft. Bowel sounds are normal. She exhibits no distension.  Musculoskeletal: She exhibits edema and tenderness.  Neurological: She is alert and oriented.  Motor: Bilateral upper extremities: 4+/5 proximal to distal Right lower extremity: 4+/5 excellent to distal Left lower extremity hip flexion 2/5, ankle dorsi/plantar flexion 4+/5  Skin: Hip incision with dressing c/d/i. Small tender papule right wrist, Stable.  Psychiatric: She has a normal mood and affect. Her behavior is normal  Assessment/Plan: 1. Functional deficits  secondary to left displaced femoral neck fracture status post left hip bipolar hemiarthroplasty which require 3+ hours per day of interdisciplinary therapy in a comprehensive inpatient rehab setting. Physiatrist is providing close team supervision and 24 hour management of active medical problems listed below. Physiatrist and rehab team continue to assess barriers to discharge/monitor patient progress toward functional and medical goals.  Function:  Bathing Bathing position   Position: Wheelchair/chair at sink  Bathing parts Body parts bathed by patient: Right arm, Left arm, Chest, Abdomen, Front perineal area, Buttocks, Right upper leg, Left upper leg, Right lower leg, Left lower leg    Bathing assist Assist Level: More than reasonable time, Supervision or verbal cues      Upper Body Dressing/Undressing Upper body dressing   What is the patient wearing?: Bra, Pull over shirt/dress, Button up shirt Bra - Perfomed by patient: Thread/unthread right bra strap, Thread/unthread left bra strap, Hook/unhook bra (pull down sports bra)   Pull over shirt/dress - Perfomed by patient: Thread/unthread right sleeve, Thread/unthread left sleeve, Put head through opening, Pull shirt over trunk   Button up shirt - Perfomed by patient: Thread/unthread right sleeve, Thread/unthread left sleeve, Pull shirt around back      Upper body assist Assist Level: Set up   Set up : To obtain clothing/put away  Lower Body Dressing/Undressing Lower body dressing   What is the patient wearing?: Underwear, Pants, Shoes, Non-skid slipper socks Underwear - Performed by patient: Thread/unthread right underwear  leg, Thread/unthread left underwear leg, Pull underwear up/down   Pants- Performed by patient: Thread/unthread right pants leg, Pull pants up/down, Fasten/unfasten pants Pants- Performed by helper: Thread/unthread left pants leg Non-skid slipper socks- Performed by patient: Don/doff right sock, Don/doff left  sock       Shoes - Performed by patient: Don/doff right shoe, Don/doff left shoe            Lower body assist Assist for lower body dressing: Set up, Touching or steadying assistance (Pt > 75%)   Set up : To obtain clothing/put away  Toileting Toileting Toileting activity did not occur: No continent bowel/bladder event Toileting steps completed by patient: Adjust clothing prior to toileting, Performs perineal hygiene, Adjust clothing after toileting Toileting steps completed by helper: Adjust clothing prior to toileting Toileting Assistive Devices: Grab bar or rail  Toileting assist Assist level: Supervision or verbal cues   Transfers Chair/bed transfer   Chair/bed transfer method: Stand pivot Chair/bed transfer assist level: Touching or steadying assistance (Pt > 75%) Chair/bed transfer assistive device: Armrests, Patent attorney     Max distance: 120 Assist level: Supervision or verbal cues   Wheelchair   Type: Manual Max wheelchair distance: 150 Assist Level: Supervision or verbal cues  Cognition Comprehension Comprehension assist level: Understands complex 90% of the time/cues 10% of the time  Expression Expression assist level: Expresses complex 90% of the time/cues < 10% of the time  Social Interaction Social Interaction assist level: Interacts appropriately with others with medication or extra time (anti-anxiety, antidepressant).  Problem Solving Problem solving assist level: Solves complex 90% of the time/cues < 10% of the time  Memory Memory assist level: More than reasonable amount of time    Medical Problem List and Plan: 1. Debilitation with immobility secondary to left displaced femoral neck fracture status post left hip bipolar hemiarthroplasty direct anterior approach 11/11/2014.   Weightbearing as tolerated  -continue therapies 2. DVT Prophylaxis/Anticoagulation: Chronic Coumadin. Monitor for rebleeding episodes  INR  supratherapeutic this AM 3. Pain Management: Hydrocodone and Robaxin as needed. Monitor mental status 4. Acute blood loss anemia.   Hb 9.6 on 5/16 (stable)  Will cont to monitor 5. Neuropsych: This patient is capable of making decisions on his own behalf. 6. Skin/Wound Care: Routine skin checks  Per surgery, hip dressing not to be removed or changed until follow up with surgery (~5/20) 7. Fluids/Electrolytes/Nutrition: Routine I&O   BMP within acceptable range on 5/16, will cont to monitor 8. Atrial fibrillation.   Amiodarone 4 mg twice a day  Cardizem CD 120 mg daily, increased to 180 by cardiology  If still in Afib in few weeks, plan for cardioversion  Follow-up cardiology services as needed 9 Diastolic congestive heart failure.   Lasix 20 mg daily, increased to 40 mg daily on 5/16.    Weigh patient daily.   Monitor for any signs of fluid overload 10. Hyperlipidemia. Lipitor 11. Constipation. Laxative assistance 12. Hypoalbuminemia  -protein supp 13. GU  Urinary retention and incontinence reported, continue I/O caths when necessary, improving  UA neg, urine culture with Citrobacter freundii on 5/12  Bactrim started 5/14-5/19  DC'd Flomax  Will order PVR's  14. Insomnia  Low dose trazodone started PRN 5/12 15. Cellulitis right dorsal wrist  Small pinpoint papule, stable  Patient currently on Bactrim  Will continue to monitor for improvement 16. Tremors  Likely secondary to medications  Will DC Claritin  LOS (Days) 5 A FACE TO FACE EVALUATION  WAS PERFORMED  Kendra Hughes Karis Juba 11/21/2015 8:27 AM

## 2015-11-21 NOTE — Progress Notes (Signed)
ANTICOAGULATION CONSULT NOTE - Follow Up Consult  Pharmacy Consult for coumadin Indication: atrial fibrillation  No Known Allergies  Patient Measurements: Height: 5\' 5"  (165.1 cm) Weight: 157 lb 1.6 oz (71.26 kg) IBW/kg (Calculated) : 57 Heparin Dosing Weight:   Vital Signs: Temp: 97.6 F (36.4 C) (05/16 0503) Temp Source: Oral (05/16 0503) BP: 111/56 mmHg (05/16 0503) Pulse Rate: 100 (05/16 0503)  Labs:  Recent Labs  11/19/15 0440 11/20/15 0421 11/21/15 0423  HGB  --   --  9.6*  HCT  --   --  28.9*  PLT  --   --  463*  LABPROT 26.3* 28.5* 33.1*  INR 2.45* 2.73* 3.33*  CREATININE  --   --  0.73    Estimated Creatinine Clearance: 46.3 mL/min (by C-G formula based on Cr of 0.73).   Medications:  Scheduled:  . amiodarone  400 mg Oral BID  . atorvastatin  10 mg Oral q1800  . diclofenac sodium  2 g Topical QID  . diltiazem  180 mg Oral Daily  . fluticasone  1 spray Each Nare Daily  . folic acid  1 mg Oral Daily  . furosemide  20 mg Oral Daily  . loratadine  10 mg Oral Daily  . protein supplement  1 scoop Oral TID WC  . sulfamethoxazole-trimethoprim  1 tablet Oral Q12H  . Vitamin D (Ergocalciferol)  50,000 Units Oral Weekly  . Warfarin - Pharmacist Dosing Inpatient   Does not apply q1800   Infusions:    Assessment: 82 yo female with afib is currently on supratherapeutic coumadin. INR today is up to 3.33 probably due to bactrim added few days ago.  Goal of Therapy:  INR 2-3 Monitor platelets by anticoagulation protocol: Yes   Plan:  - No coumadin tonight - INR in am  Mckenlee Mangham, Tsz-Yin 11/21/2015,8:05 AM

## 2015-11-21 NOTE — Progress Notes (Signed)
Occupational Therapy Session Note  Patient Details  Name: Kendra Hughes MRN: [id number] Date of Birth: [birth year]  Today's Date: 11/21/2015 OT Individual Time: 0930-1030 OT Individual Time Calculation (min): 60 min    Short Term Goals: Week 1:  OT Short Term Goal 1 (Week 1): STG=LTG d/t short anticipated LOS  Skilled Therapeutic Interventions/Progress Updates: ADL-retraining with focus on improved problem-solving, AE training using reacher/sock aid, and dynamic standing balance.   Pt received seated in w/c and requesting to toilet.   After setup to provide RW, pt ambulates to bathroom with contact guard and transfers to [facility] over toilet to toilet.   Pt manages clothing, voids BM, and completes hygiene unassisted.   Pt remained on toilet and requested setup for bathing/dressing and performs bathing unassisted although minimally attending to her feet.   Pt dresses upper body but is unsuccessful with use of AE to don underwear and pants.   Per dw OT, rather than develop skill with use of AE, pt anticipates that her husband will attend to assisting her each morning until her reach improves.   Long term goals are therefore downgraded to reflect need for min assist with lower body dressing and supervision/setup for bathing at pt's request.   Pt also denied need for practice with home making or full meal prep due to anticipating her husband's significant assist.   Pt groomed seated during this session d/t fatigue and recovered to recliner at end of session with all needs within reach.     Therapy Documentation Precautions:  Precautions Precautions: Fall Restrictions Weight Bearing Restrictions: Yes LLE Weight Bearing: Weight bearing as tolerated   Pain: Pain Assessment Pain Assessment: No/denies pain  ADL: ADL ADL Comments: See Functional Assessment Tool  See Function Navigator for Current Functional Status.   Therapy/Group: Individual Therapy  Ubah Radke 11/21/2015, 12:42 PM

## 2015-11-21 NOTE — Progress Notes (Signed)
Physical Therapy Session Note  Patient Details  Name: Kendra Hughes MRN: [id number] Date of Birth: 1923/05/04  Today's Date: 11/21/2015 PT Individual Time: 2979-8921 and [id number] PT Individual Time Calculation (min): 70 min and 61 min  Short Term Goals: Week 1:  PT Short Term Goal 1 (Week 1): LTG = STG due to estimated D/C date.   Skilled Therapeutic Interventions/Progress Updates:    Treatment 1: Pt received in w/c with husband & son present, agreeable to PT, noting 4/10 L LE soreness. Pt ambulated room>ortho gym x 100 ft with RW & Min Guard/close supervision with RW. Pt able to demonstrate car transfer from sedan simulated seat height with supervision, using BUE to transfer BLE in/out of car, and multiple trials for sit>stand. PT cued pt to scoot forward to edge of seat and for anterior weight shift. In rehab apartment pt with difficulty transferring LLE sit>supine on to bed, requiring min A from PT. Educated pt on use of leg lifter & pt able to transfer supine>sit with close supervision, maximum cuing & use of left lifter for LLE. Pt with difficulty following commands to roll L<>R but once able to do so pt able to roll R with supervision & roll partially to L with supervision. Pt's family returned to pt's session & agreed to practicing real car transfer. Pt transported to hospital entrance & practiced rea  car transfer with RW & supervision from therapist, with pt able to push on dashboard & seat to transfer sit>stand. Pt practiced car transfer with husband and on this trial pt had very poor eccentric control when lowering herself into car, transferred to sitting before squaring up to car, and did not reach back with BUE for support. Educated pt on importance of performing transfer slowly & safely as she did on first trial to prevent falling. Pt transported back to unit via w/c total A & after rest break negotiated 3 steps laterally with L ascending rail with Min Guard. Therapist educated pt on  sequencing, ascend leading with RLE & descend leading with LLE. At top of stairs pt reported she felt like her "heart rate was going up".  Assisted pt down stairs & to w/c for seated rest break & HR = 155 bpm, but reduced to 122 bpm after 5 minute rest break. PT notified RN of pt's elevated HR during & after stair negotiation. Pt returned to room & transferred w/c>recliner with Min A. Pt left with BLE elevated, all needs within reach, & family present to supervise.  During session pt reported shaking in her UE's that has hindered her ability to hold a fork & eat food, but also reports that RN is aware.   Treatment 2: Pt received in w/c agreeable to PT, noting 4-5/10 BLE soreness but declining pain medication. Pt self propelled w/c room>rehab apartment ~100 ft with BUE & mod I. In rehab apartment discussed home bedroom setup with pt further & when pt is standing facing bed she gets in bed on right side. Pt able to ambulate w/c>bed with cuing to turn laterally & walk sideways to negotiate small spaces. Pt able to transfer sit<>supine with supervision & roll R with supervision when getting in bed on that side. Pt ambulated from apartment>gym with supervision & RW. Utilized nu-step on Level 1 x 10 minuteswith pt reporting 13-14 on Borg RPE scale and education to pace herself during activity & utilizing rest breaks as needed. Challenged pt's balance & activity tolerance by having pt stand on compliant surface while  playing card matching game. Pt with difficulty following instructions for game, requiring frequent cuing from therapist. Pt able to tolerate standing 8 minutes 30 seconds, with range of BUE to no UE support before requiring seated rest break. Pt with noticeable BUE shaking interfering with her ability to pick up cards & therapist notified RN. At end of session pt transported back to room via w/c total A for time management. Pt left sitting in w/c with all needs within reach.  Therapy  Documentation Precautions:  Precautions Precautions: Fall Restrictions Weight Bearing Restrictions: Yes LLE Weight Bearing: Weight bearing as tolerated  Pain: Pain Assessment Pain Assessment: 0-10 Pain Score: 4  Pain Location: Leg Pain Orientation: Left Pain Descriptors / Indicators: Sore Pain Intervention(s): Repositioned   See Function Navigator for Current Functional Status.   Therapy/Group: Individual Therapy  Sandi Mariscal 11/21/2015, 1:12 PM

## 2015-11-22 ENCOUNTER — Inpatient Hospital Stay (HOSPITAL_COMMUNITY): Payer: Medicare Other | Admitting: Occupational Therapy

## 2015-11-22 ENCOUNTER — Inpatient Hospital Stay (HOSPITAL_COMMUNITY): Payer: Medicare Other | Admitting: Physical Therapy

## 2015-11-22 LAB — PROTIME-INR
INR: 3.43 — ABNORMAL HIGH (ref 0.00–1.49)
Prothrombin Time: 33.8 seconds — ABNORMAL HIGH (ref 11.6–15.2)

## 2015-11-22 MED ORDER — AMIODARONE HCL 200 MG PO TABS
200.0000 mg | ORAL_TABLET | Freq: Every day | ORAL | Status: DC
  Administered 2015-11-23 – 2015-11-25 (×3): 200 mg via ORAL
  Filled 2015-11-22 (×3): qty 1

## 2015-11-22 MED ORDER — METOPROLOL TARTRATE 12.5 MG HALF TABLET
12.5000 mg | ORAL_TABLET | Freq: Two times a day (BID) | ORAL | Status: DC
  Administered 2015-11-22 – 2015-11-25 (×6): 12.5 mg via ORAL
  Filled 2015-11-22 (×6): qty 1

## 2015-11-22 NOTE — Patient Care Conference (Signed)
Inpatient RehabilitationTeam Conference and Plan of Care Update Date: 11/22/2015   Time: 2:00 PM    Patient Name: Kendra Hughes      Medical Record Number: [id number]  Date of Birth: 21-Sep-1922 Sex: Female         Room/Bed: 4M02C/4M02C-01 Payor Info: Payor: MEDICARE / Plan: MEDICARE PART A AND B / Product Type: *No Product type* /    Admitting Diagnosis: AFIB  Admit Date/Time:  11/16/2015  4:59 PM Admission Comments: No comment available   Primary Diagnosis:  <principal problem not specified> Principal Problem: <principal problem not specified>  Patient Active Problem List   Diagnosis Date Noted  . Tremors of nervous system   . Acute on chronic diastolic congestive heart failure (HCC)   . UTI (lower urinary tract infection)   . Cellulitis of right upper extremity   . S/P hip hemiarthroplasty   . Hypoalbuminemia due to protein-calorie malnutrition (HCC)   . Urinary retention   . Absence of bladder continence   . Insomnia   . Femoral neck fracture (HCC) 11/16/2015  . Atrial fibrillation with rapid ventricular response (HCC)   . Primary osteoarthritis of right knee   . Atrial fibrillation with RVR (HCC)   . Fracture of femoral neck, left (HCC)   . Tachycardia   . Acute blood loss anemia   . Postoperative pain   . Postoperative hypotension   . Vitamin D deficiency   . Hyponatremia   . Status post hip hemiarthroplasty   . Closed left hip fracture (HCC) 11/10/2015  . Chronic diastolic (congestive) heart failure (HCC)   . Hypotension 08/26/2013  . Mild AI, MR, mod TR by TEE 08/23/13 08/26/2013  . Persistent atrial fibrillation (HCC) 02/12/2013  . HTN (hypertension) 02/12/2013  . Hyperlipidemia 02/12/2013  . DOE (dyspnea on exertion) 02/12/2013    Expected Discharge Date: Expected Discharge Date: 11/25/15  Team Members Present: Physician leading conference: Dr. Maryla Morrow Social Worker Present: Dossie Der, LCSW Nurse Present: Chana Bode, RN PT Present: Grier Rocher,  PT;Victoria Hyacinth Meeker, PT OT Present: Roney Mans, OT SLP Present: Jackalyn Lombard, SLP PPS Coordinator present : Tora Duck, RN, CRRN     Current Status/Progress Goal Weekly Team Focus  Medical   Debilitation with immobility secondary to left displaced femoral neck fracture status post left hip bipolar hemiarthroplasty  Improve HF, UTI, tremors, cellulitis, follow INR  see above   Bowel/Bladder   Continent Bowel/Bladder LBM 5/15  Patient to remain continent of Bowel/Bladder   Assess Bowel/Bladder and timed toilet patient   Swallow/Nutrition/ Hydration             ADL's   Overall Min assist for BADL; contact guard during functional mobility, supervision during transfers and problem-solving  Overall supervision and setup for upper body BADL and transfers, min assist for lower body dressing  Improved endurance, dynamic standing balance, functional mobility using RW, and adapted bathing/dressing skills   Mobility   min guard/close supervision for ambulation with RW, min guard for 3 stair negotiation with L rail, supervision for car transfer, Min A for bed mobility  supervision standing balance, supervision ambulation with RW, supervision for stair negotiation x 2 steps with 1 rial  family training, stair negotiation, gait training, car transfers   Communication             Safety/Cognition/ Behavioral Observations            Pain   Denied any pain or discomfort  <3  Assess and treat pain Q  shift and as needed   Skin   L hip surgical incision; MASD rash inner thigh  Patient sking to be free of Infection/breakdown  Assess skin Q shift and as needed; turn and reposition patient Q2hrs.      *See Care Plan and progress notes for long and short-term goals.  Barriers to Discharge: Supratherapeutic INR, HF, UTI, ?urninary retention, tremors, cellulitis    Possible Resolutions to Barriers:  Follow labs, optimize HF meds, cont abx, follow PVRs, follow up on meds    Discharge  Planning/Teaching Needs:  Home with husband who can provide assist has been here to observe in therapies      Team Discussion:  Goals of supervision level, hands tremoring which is new. MD to look into this. Some memory issues with recall and hip precautions. Husband and son have been educated with her care. DC Sat  Revisions to Treatment Plan:  DC Sat   Continued Need for Acute Rehabilitation Level of Care: The patient requires daily medical management by a physician with specialized training in physical medicine and rehabilitation for the following conditions: Daily direction of a multidisciplinary physical rehabilitation program to ensure safe treatment while eliciting the highest outcome that is of practical value to the patient.: Yes Daily medical management of patient stability for increased activity during participation in an intensive rehabilitation regime.: Yes Daily analysis of laboratory values and/or radiology reports with any subsequent need for medication adjustment of medical intervention for : Post surgical problems;Cardiac problems;Other  Kaynen Minner, Lemar Livings 11/22/2015, 3:08 PM

## 2015-11-22 NOTE — Progress Notes (Signed)
ANTICOAGULATION CONSULT NOTE - Follow Up Consult  Pharmacy Consult for coumadin Indication: atrial fibrillation  No Known Allergies  Patient Measurements: Height: 5\' 5"  (165.1 cm) Weight: 151 lb 14.4 oz (68.901 kg) IBW/kg (Calculated) : 57 Heparin Dosing Weight:   Vital Signs: Temp: 97.9 F (36.6 C) (05/17 0547) Temp Source: Oral (05/17 0547) BP: 108/49 mmHg (05/17 0547) Pulse Rate: 71 (05/17 0547)  Labs:  Recent Labs  11/20/15 0421 11/21/15 0423 11/22/15 0425  HGB  --  9.6*  --   HCT  --  28.9*  --   PLT  --  463*  --   LABPROT 28.5* 33.1* 33.8*  INR 2.73* 3.33* 3.43*  CREATININE  --  0.73  --     Estimated Creatinine Clearance: 45.6 mL/min (by C-G formula based on Cr of 0.73).   Medications:  Scheduled:  . amiodarone  400 mg Oral BID  . atorvastatin  10 mg Oral q1800  . diclofenac sodium  2 g Topical QID  . diltiazem  180 mg Oral Daily  . fluticasone  1 spray Each Nare Daily  . folic acid  1 mg Oral Daily  . furosemide  40 mg Oral Daily  . protein supplement  1 scoop Oral TID WC  . sulfamethoxazole-trimethoprim  1 tablet Oral Q12H  . Vitamin D (Ergocalciferol)  50,000 Units Oral Weekly  . Warfarin - Pharmacist Dosing Inpatient   Does not apply q1800   Infusions:    Assessment: 82 yo female with afib is currently on supratherapeutic coumadin.  INR today is 3.43.  Goal of Therapy:  INR 2-3 Monitor platelets by anticoagulation protocol: Yes   Plan:   - No coumadin tonight   Daily PT/INR  Allysha Tryon, Tsz-Yin 11/22/2015,8:11 AM

## 2015-11-22 NOTE — Progress Notes (Signed)
Social Work Lucy Chris, LCSW Social Worker Signed  Patient Care Conference 11/22/2015  3:08 PM    Expand All Collapse All   Inpatient RehabilitationTeam Conference and Plan of Care Update Date: 11/22/2015   Time: 2:00 PM     Patient Name: Kendra Hughes       Medical Record Number: [id number]  Date of Birth: [birth year] Sex: Female         Room/Bed: 4M02C/4M02C-01 Payor Info: Payor: MEDICARE / Plan: MEDICARE PART A AND B / Product Type: *No Product type* /    Admitting Diagnosis: AFIB   Admit Date/Time:  11/16/2015  4:59 PM Admission Comments: No comment available   Primary Diagnosis:  <principal problem not specified> Principal Problem: <principal problem not specified>    Patient Active Problem List     Diagnosis  Date Noted   .  Tremors of nervous system     .  Acute on chronic diastolic congestive heart failure (HCC)     .  UTI (lower urinary tract infection)     .  Cellulitis of right upper extremity     .  S/P hip hemiarthroplasty     .  Hypoalbuminemia due to protein-calorie malnutrition (HCC)     .  Urinary retention     .  Absence of bladder continence     .  Insomnia     .  Femoral neck fracture (HCC)  11/16/2015   .  Atrial fibrillation with rapid ventricular response (HCC)     .  Primary osteoarthritis of right knee     .  Atrial fibrillation with RVR (HCC)     .  Fracture of femoral neck, left (HCC)     .  Tachycardia     .  Acute blood loss anemia     .  Postoperative pain     .  Postoperative hypotension     .  Vitamin D deficiency     .  Hyponatremia     .  Status post hip hemiarthroplasty     .  Closed left hip fracture (HCC)  11/10/2015   .  Chronic diastolic (congestive) heart failure (HCC)     .  Hypotension  08/26/2013   .  Mild AI, MR, mod TR by TEE 08/23/13  08/26/2013   .  Persistent atrial fibrillation (HCC)  02/12/2013   .  HTN (hypertension)  02/12/2013   .  Hyperlipidemia  02/12/2013   .  DOE (dyspnea on exertion)  02/12/2013      Expected Discharge Date: Expected Discharge Date: 11/25/15  Team Members Present: Physician leading conference: Dr. Maryla Morrow Social Worker Present: Dossie Der, LCSW Nurse Present: Chana Bode, RN PT Present: Grier Rocher, PT;Victoria Hyacinth Meeker, PT OT Present: Roney Mans, OT SLP Present: Jackalyn Lombard, SLP PPS Coordinator present : Tora Duck, RN, CRRN        Current Status/Progress  Goal  Weekly Team Focus   Medical     Debilitation with immobility secondary to left displaced femoral neck fracture status post left hip bipolar hemiarthroplasty  Improve HF, UTI, tremors, cellulitis, follow INR  see above   Bowel/Bladder     Continent Bowel/Bladder LBM 5/15  Patient to remain continent of Bowel/Bladder   Assess Bowel/Bladder and timed toilet patient    Swallow/Nutrition/ Hydration               ADL's     Overall Min assist for BADL; contact guard during functional mobility, supervision  during transfers and problem-solving  Overall supervision and setup for upper body BADL and transfers, min assist for lower body dressing  Improved endurance, dynamic standing balance, functional mobility using RW, and adapted bathing/dressing skills    Mobility     min guard/close supervision for ambulation with RW, min guard for 3 stair negotiation with L rail, supervision for car transfer, Min A for bed mobility  supervision standing balance, supervision ambulation with RW, supervision for stair negotiation x 2 steps with 1 rial   family training, stair negotiation, gait training, car transfers    Communication               Safety/Cognition/ Behavioral Observations              Pain     Denied any pain or discomfort  <3  Assess and treat pain Q shift and as needed    Skin     L hip surgical incision; MASD rash inner thigh   Patient sking to be free of Infection/breakdown  Assess skin Q shift and as needed; turn and reposition patient Q2hrs.      *See Care Plan and progress notes  for long and short-term goals.    Barriers to Discharge:  Supratherapeutic INR, HF, UTI, ?urninary retention, tremors, cellulitis     Possible Resolutions to Barriers:   Follow labs, optimize HF meds, cont abx, follow PVRs, follow up on meds     Discharge Planning/Teaching Needs:   Home with husband who can provide assist has been here to observe in therapies       Team Discussion:    Goals of supervision level, hands tremoring which is new. MD to look into this. Some memory issues with recall and hip precautions. Husband and son have been educated with her care. DC Sat   Revisions to Treatment Plan:    DC Sat    Continued Need for Acute Rehabilitation Level of Care: The patient requires daily medical management by a physician with specialized training in physical medicine and rehabilitation for the following conditions: Daily direction of a multidisciplinary physical rehabilitation program to ensure safe treatment while eliciting the highest outcome that is of practical value to the patient.: Yes Daily medical management of patient stability for increased activity during participation in an intensive rehabilitation regime.: Yes Daily analysis of laboratory values and/or radiology reports with any subsequent need for medication adjustment of medical intervention for : Post surgical problems;Cardiac problems;Other  Lucy Chris 11/22/2015, 3:08 PM                  Patient ID: Marion Downer, female   DOB: [birth year], 82 y.o.   MRN: [id number]

## 2015-11-22 NOTE — Progress Notes (Signed)
Physical Therapy Session Note  Patient Details  Name: Kendra Hughes MRN: [id number] Date of Birth: 1923/01/01  Today's Date: 11/22/2015 PT Individual Time: 1305-1401 PT Individual Time Calculation (min): 56 min   Short Term Goals: Week 1:  PT Short Term Goal 1 (Week 1): LTG = STG due to estimated D/C date.    Skilled Therapeutic Interventions/Progress Updates:    Pt received in recliner, with son & husband present, and agreeable to PT. Pt noting 6/10 BLE anterior thigh soreness but denying pain medication. Discussed need for 24/7 supervision after d/c from CIR, as well as w/c for community access & RW for ambulation in home. Pt will also need 3-in-1 BSC for safety with toilet transfers. Pt voiced need to use restroom & ambulated 10 ft with RW & supervision A, pt able to perform toilet transfers with use of armrests & supervision, (+) void. Gait training x 100 ft room>ortho gym where pt negotiated 3 steps x 2 trials with prolonged sitting break in between. Pt utilized L railing to simulate hand rail at home & pt able to complete stair negotiation laterally with BUE support & steady fading to supervision A. Educated pt & family on correct LE sequencing for negotiating stairs & family present to observe. Pt transferred to sitting in w/c with poorly controlled descent, not squaring up to chair, and did not reach back with BUE. PT educated pt & family on importance of pt making slow, purposeful movements to w/c & to reach back for seat to prevent pt from falling. Educated pt to slow down when performing activities to increase her safety. Transported pt to main gym & challenged pt's balance by having pt stand without BUE support and reaching to obtain & toss horseshoes. Pt able to maintain standing during task with normal stance & min guard A. Challenged balance further by having pt perform static standing with narrow base of support but pt required Min A and HHA to achieve stance. Pt able to tolerate standing  with narrow BOS x 1-2 minutes with posterior lean and poor righting reactions, thus requiring min/mod A from PT to correct. Pt also with high anxiety levels during balance tasks. At end of session pt returned to w/c & transported back to room; pt left in w/c with family present to supervise & all needs within reach.   Therapy Documentation Precautions:  Precautions Precautions: Fall Restrictions Weight Bearing Restrictions: No LLE Weight Bearing: Weight bearing as tolerated  Pain: Pain Assessment Pain Assessment: 0-10 Pain Score: 6  Pain Location: Leg (anterior thighs) Pain Orientation: Left;Right Pain Descriptors / Indicators: Sore Pain Intervention(s): Repositioned (declined pain medication)   See Function Navigator for Current Functional Status.   Therapy/Group: Individual Therapy  Sandi Mariscal 11/22/2015, 2:26 PM

## 2015-11-22 NOTE — Progress Notes (Signed)
Physical Therapy Session Note  Patient Details  Name: Kendra Hughes MRN: [id number] Date of Birth: [birth year]  Today's Date: 11/22/2015 PT Individual Time: 0800-0915 PT Individual Time Calculation (min): 75 min   Short Term Goals: Week 1:  PT Short Term Goal 1 (Week 1): LTG = STG due to estimated D/C date.   Skilled Therapeutic Interventions/Progress Updates:    Patient received sitting up in chair and agreeable to PT. Patient able to perform LE and upper body dressing with min A for sit to stand to don pants. WC mobility for 176ft and 123ft with report of significant fatigue following each distance, supervision A From PT.   Gait training for 193ft with RW and one standing rest break with supervision progressing to min min A from PT with increased distance   Standing BLE therex  Marchers x 12 Hip abduction x 12  Hip extension x 12  PT provided Supervision A with min cues for improved weight bearing on LLE as tolerated as well as for improved ROM and decreased compensation at trunk.   Stair training with one rail on L for ascent and R foe descent using BUE on 1 rail with min A on first bout and close supervision on second bout with mod-max cues for UE positioning and step to gait pattern.   Balance training.  Toe taps on  4 inch box x 10 BLE and toe taps on 6 inch cone x10 BLE. For all balance training, patient required mod A to prevent lateral LOB to L with Single leg stance on L LE.   Throughout treatment, patient performed sit<>stand x10 and WC<>bed transfer x 4 with min A from PT and constant cues for improved erect posture and increased anterior weight shift to maintain COM over BLE.   Patient returned to room at end of session and left sitting in recliner with call bell within reach.   Therapy Documentation Precautions:  Precautions Precautions: Fall Restrictions Weight Bearing Restrictions: No LLE Weight Bearing: Weight bearing as tolerated General:   Vital  Signs: Therapy Vitals BP: 127/84 mmHg Pain: Pain Assessment Pain Assessment: No/denies pain Pain Score: 0-No pain   See Function Navigator for Current Functional Status.   Therapy/Group: Individual Therapy  Golden Pop 11/22/2015, 12:44 PM

## 2015-11-22 NOTE — Progress Notes (Signed)
Occupational Therapy Session Note  Patient Details  Name: Kendra Hughes MRN: [id number] Date of Birth: [birth year]  Today's Date: 11/22/2015 OT Individual Time: 1000-1100 OT Individual Time Calculation (min): 60 min    Short Term Goals: Week 1:  OT Short Term Goal 1 (Week 1): STG=LTG d/t short anticipated LOS  Skilled Therapeutic Interventions/Progress Updates:    1:1 self care retraining. Pt declined full  Bathing and dressing today; reporting she donned clean clothing earlier this morning. Focus on sit to stands from different surfaces, toileting, toilet transfers, functional ambulation around room with RW, standing endurance and Lb dressing to change underwear. Pt able to perform sit to stands with steady A to close supervision with extra time. Pt required VC for dressing left LE first for more success and instructional cues on using reacher to assist with threading left LE after right LE. Pt able to maintain standing balance with supervision during pericare/ peri hygiene. Pt transitioned down to the gym and focused on stepping over obstacles- simulating thresholds at home (ie shower stall) and in the community of varying heights. Pt able to perform all with close supervision however required max cuing for instruction for placement of RW and body in anticipation of stepping over threshold. Performed bed mobility in ADL apartment in regular bed with distant supervision with VC for positioning of body in bed. Return to room and left with family in recliner.   Therapy Documentation Precautions:  Precautions Precautions: Fall Restrictions Weight Bearing Restrictions: No LLE Weight Bearing: Weight bearing as tolerated Pain: Reports soreness in left LE ADL: ADL ADL Comments: See Functional Assessment Tool  See Function Navigator for Current Functional Status.   Therapy/Group: Individual Therapy  Roney Mans [facility] 11/22/2015, 10:26 AM

## 2015-11-22 NOTE — Progress Notes (Signed)
Patient Name: Kendra Hughes Date of Encounter: 11/22/2015  Hospital Problem List     Principal Problem:   Femoral neck fracture [facility]) Active Problems:   Atrial fibrillation with rapid ventricular response (HCC)   Primary osteoarthritis of right knee   S/P hip hemiarthroplasty   Hypoalbuminemia due to protein-calorie malnutrition (HCC)   Urinary retention   Absence of bladder continence   Insomnia   UTI (lower urinary tract infection)   Cellulitis of right upper extremity   Tremors of nervous system   Acute on chronic diastolic congestive heart failure (HCC)   Subjective   No chest pain, palpitations, or sob.  Hip feels well.  She's been working w/ PT.  Since this past weekend, she has noted worsening of a slight tremor that she has had for several years.  Most noticeable with intention but present to a lesser degree @ rest as well.  Inpatient Medications    . amiodarone  400 mg Oral BID  . atorvastatin  10 mg Oral q1800  . diclofenac sodium  2 g Topical QID  . diltiazem  180 mg Oral Daily  . fluticasone  1 spray Each Nare Daily  . folic acid  1 mg Oral Daily  . furosemide  40 mg Oral Daily  . protein supplement  1 scoop Oral TID WC  . sulfamethoxazole-trimethoprim  1 tablet Oral Q12H  . Vitamin D (Ergocalciferol)  50,000 Units Oral Weekly  . Warfarin - Pharmacist Dosing Inpatient   Does not apply q1800    Vital Signs    Filed Vitals:   11/21/15 1500 11/22/15 0547 11/22/15 1103 11/22/15 1436  BP: 127/66 108/49 127/84 102/55  Pulse: 97 71  96  Temp: 97.6 F (36.4 C) 97.9 F (36.6 C)  98 F (36.7 C)  TempSrc: Oral Oral  Oral  Resp: 16 16  18   Height:      Weight:  151 lb 14.4 oz (68.901 kg)    SpO2: 95% 100%  98%    Intake/Output Summary (Last 24 hours) at 11/22/15 1545 Last data filed at 11/22/15 1300  Gross per 24 hour  Intake    720 ml  Output      0 ml  Net    720 ml   Filed Weights   11/20/15 0611 11/21/15 0503 11/22/15 0547  Weight: 166 lb 3.6 oz  (75.4 kg) 157 lb 1.6 oz (71.26 kg) 151 lb 14.4 oz (68.901 kg)    Physical Exam    General: Pleasant, NAD. Neuro: Alert and oriented X 3. Moves all extremities spontaneously. Psych: Normal affect. HEENT:  Normal  Neck: Supple without bruits or JVD. Lungs:  Resp regular and unlabored, CTA. Heart: IR, IR no s3, s4, or murmurs. Abdomen: Soft, non-tender, non-distended, BS + x 4.  Extremities: No clubbing, cyanosis or edema. DP/PT/Radials 2+ and equal bilaterally.  Labs    CBC  Recent Labs  11/21/15 0423  WBC 7.7  NEUTROABS 6.1  HGB 9.6*  HCT 28.9*  MCV 90.0  PLT 463*   Basic Metabolic Panel  Recent Labs  11/21/15 0423  NA 134*  K 3.6  CL 95*  CO2 29  GLUCOSE 103*  BUN 15  CREATININE 0.73  CALCIUM 8.8*    Telemetry    N/A  Radiology    Dg Chest Port 1 View  11/12/2015  CLINICAL DATA:  82 year old female with tachycardia and AFib EXAM: PORTABLE CHEST 1 VIEW COMPARISON:  Chest radiograph dated 08/19/2013 and CT dated 10/07/2009  FINDINGS: Single portable view of the chest demonstrates emphysematous changes of the lungs with areas of stable scarring at the apices. There is no focal consolidation. There is mild blunting of the left costophrenic angle which may represent a small left pleural effusion. There is no pneumothorax with stable mild cardiomegaly. No acute osseous pathology identified. IMPRESSION: Emphysema with probable small left pleural effusion. No focal consolidation or pneumothorax. Electronically Signed   By: Elgie Collard M.D.   On: 11/12/2015 18:47   Dg Hip Port Unilat With Pelvis 1v Left  11/10/2015  CLINICAL DATA:  Closed left hip fracture, initial visit. EXAM: DG HIP (WITH OR WITHOUT PELVIS) 1V PORT LEFT COMPARISON:  Left hip series of Nov 09, 2015 at 2200 hours. FINDINGS: The bony pelvis is diffusely osteopenic. No acute pelvic fracture is observed. There is an acute transverse subcapital fracture of the left hip with varus angulation. There is  persistent superior migration of the femoral neck and a remainder of the femur superiorly with respect to the femoral head and acetabulum. There is narrowing of the left hip joint space similar to that seen on the right. IMPRESSION: Acute subcapsular fracture of the left hip with superior migration of the left hip and femur. Electronically Signed   By: David  [place] M.D.   On: 11/10/2015 08:29   Dg Hip Operative Unilat W Or W/o Pelvis Left  11/11/2015  CLINICAL DATA:  Femoral neck fracture. Initial encounter. EXAM: OPERATIVE left HIP (WITH PELVIS IF PERFORMED) AP VIEWS TECHNIQUE: Fluoroscopic spot image(s) were submitted for interpretation post-operatively. COMPARISON:  Radiography from yesterday FINDINGS: Bipolar left hip hemiarthroplasty without evidence of periprosthetic fracture or dislocation. IMPRESSION: Fluoroscopy for left hip hemiarthroplasty.  No adverse finding. Electronically Signed   By: Marnee Spring M.D.   On: 11/11/2015 09:27    Assessment & Plan    1. Left Femoral Neck Fracture: S/p left hemiarthroplasty on 5/6.  Recovering well in rehab.  2.  Persistent Atrial Fibrillation:  H/o PAF with persistent AF since 10/2013  rate controlled and anticoagulated with coumadin as an outpt.  In the setting of #1, she had rapid rates following admission and was placed on amiodarone for rate control.  Consideration was given to TEE/DCCV given tachycardia however rates improved on IV amio and she was subsequently switched to PO amio.  She is now in rehab with HR's trending in the 70's to 100.  Prior to admission she was well-rate controlled on dilt 120 and lopressor 50 bid.  She is mildly anemic, though stable.  TSH nl.  Renal fxn stable. At baseline, she has a very mild tremor but since Sunday, this has worsened and is especially noticeable with intention.  Pt and care team concerned that worsening tremor may be related to ongoing amio therapy.  Options include reducing amio to holding altogether and  assessing for improvement.  Will d/w Dr. Royann Shivers.  Either way, we will need to resume low dose  blocker as her rates are likely to increase off of amio.  Her BP has continued to run soft and we will need to be careful to prevent orthostasis.  With advanced age, prefer to avoid digoxin.  3.  Chronic diastolic chf: Nl EF by echo 5/10.  Wt has been coming down since admission - currently listed as 151 lbs - lowest since 02/17/15.  BP stable.  HR variable - relatively well rate controlled.  Renal fxn nl.  Cont dilt and lasix.  4. Normocytic anemia:  In setting of  prolonged hospitalization.  Stable.  Signed, Nicolasa Ducking NP  I have seen and examined the patient along with Nicolasa Ducking NP.  I have reviewed the chart, notes and new data.  I agree with NP's note.  PLAN: Amiodarone could well be the culprit for worsening tremor. Will reduce the dose and plan to transition back to PO beta blockers, as tolerated by her BP. Ideally, will discontinue amiodarone altogether. Seems euvolemic as far as I can tell. I wonder if we could also get away with a lower diuretic dose. Will try that if low BP becomes an issue.  Thurmon Fair, MD, [facility] [phone] 11/22/2015, 7:44 PM

## 2015-11-22 NOTE — Progress Notes (Signed)
Social Work Patient ID: Kendra Hughes, female   DOB: 10-Jun-1923, 82 y.o.   MRN: [id number] Met with pt, husband and son to discuss team conference goals-supervision level and target discharge date 5/20. All in agreement with this plan and want to go ahead and get equipment even if has  To pay for a rolling walker. Family has been in for education session and glad MD looking into pt's tremors she is having. Will work toward discharge on Sat.

## 2015-11-22 NOTE — Progress Notes (Signed)
Kendra Hughes PHYSICAL MEDICINE & REHABILITATION     PROGRESS NOTE  Subjective/Complaints:  Pt sitting up in her recliner.  When asked if she needs anything, she states discharge paperwork.  She still has tremors, making if more difficult to eat.  ROS:    + Small Tender (improving) papule right dorsal wrist, tremors. Denies CP, SOB, N/V/D.  Objective: Vital Signs: Blood pressure 108/49, pulse 71, temperature 97.9 F (36.6 C), temperature source Oral, resp. rate 16, height 5\' 5"  (1.651 m), weight 68.901 kg (151 lb 14.4 oz), SpO2 100 %. No results found.  Recent Labs  11/21/15 0423  WBC 7.7  HGB 9.6*  HCT 28.9*  PLT 463*    Recent Labs  11/21/15 0423  NA 134*  K 3.6  CL 95*  GLUCOSE 103*  BUN 15  CREATININE 0.73  CALCIUM 8.8*   CBG (last 3)  No results for input(s): GLUCAP in the last 72 hours.  Wt Readings from Last 3 Encounters:  11/22/15 68.901 kg (151 lb 14.4 oz)  11/10/15 70.308 kg (155 lb)  08/21/15 70.67 kg (155 lb 12.8 oz)    Physical Exam:  BP 108/49 mmHg  Pulse 71  Temp(Src) 97.9 F (36.6 C) (Oral)  Resp 16  Ht 5\' 5"  (1.651 m)  Wt 68.901 kg (151 lb 14.4 oz)  BMI 25.28 kg/m2  SpO2 100% Constitutional: She appears well-developed and well-nourished. NAD. HENT: Normocephalic and atraumatic.  Eyes: Conjunctivae and EOM are normal.  Cardiovascular: Irregularly irregular  Respiratory: Effort normal and breath sounds normal. No respiratory distress.  GI: Soft. Bowel sounds are normal. She exhibits no distension.  Musculoskeletal: She exhibits edema and tenderness.  Neurological: She is alert and oriented.  Motor:  Bilateral upper extremities: 4+/5 proximal to distal Right lower extremity: 4+/5 proximal to distal Left lower extremity hip flexion 3-/5, ankle dorsi/plantar flexion 4+/5  Skin: Hip incision with dressing with dried drainage, otherwisec/d/i. Small tender papule right wrist, Stable in size, less tender.  Psychiatric: She has a normal mood  and affect. Her behavior is normal  Assessment/Plan: 1. Functional deficits secondary to left displaced femoral neck fracture status post left hip bipolar hemiarthroplasty which require 3+ hours per day of interdisciplinary therapy in a comprehensive inpatient rehab setting. Physiatrist is providing close team supervision and 24 hour management of active medical problems listed below. Physiatrist and rehab team continue to assess barriers to discharge/monitor patient progress toward functional and medical goals.  Function:  Bathing Bathing position   Position: Other (comment) (On BSC over toilet)  Bathing parts Body parts bathed by patient: Right arm, Left arm, Chest, Abdomen, Front perineal area, Buttocks, Right upper leg, Left upper leg, Right lower leg, Left lower leg    Bathing assist Assist Level: More than reasonable time, Supervision or verbal cues      Upper Body Dressing/Undressing Upper body dressing   What is the patient wearing?: Bra, Pull over shirt/dress, Button up shirt Bra - Perfomed by patient: Thread/unthread right bra strap, Thread/unthread left bra strap, Hook/unhook bra (pull down sports bra)   Pull over shirt/dress - Perfomed by patient: Thread/unthread right sleeve, Thread/unthread left sleeve, Put head through opening, Pull shirt over trunk   Button up shirt - Perfomed by patient: Thread/unthread right sleeve, Thread/unthread left sleeve, Pull shirt around back      Upper body assist Assist Level: Set up   Set up : To obtain clothing/put away  Lower Body Dressing/Undressing Lower body dressing   What is the patient wearing?:  Underwear, Pants, Shoes, Non-skid slipper socks Underwear - Performed by patient: Thread/unthread right underwear leg, Pull underwear up/down Underwear - Performed by helper: Thread/unthread left underwear leg Pants- Performed by patient: Thread/unthread right pants leg, Pull pants up/down, Fasten/unfasten pants, Thread/unthread left  pants leg Pants- Performed by helper: Thread/unthread left pants leg Non-skid slipper socks- Performed by patient: Don/doff right sock, Don/doff left sock       Shoes - Performed by patient: Don/doff right shoe, Don/doff left shoe            Lower body assist Assist for lower body dressing: Set up, Touching or steadying assistance (Pt > 75%)   Set up : To obtain clothing/put away  Toileting Toileting Toileting activity did not occur: No continent bowel/bladder event Toileting steps completed by patient: Adjust clothing prior to toileting, Performs perineal hygiene, Adjust clothing after toileting Toileting steps completed by helper: Adjust clothing prior to toileting Toileting Assistive Devices: Grab bar or rail  Toileting assist Assist level: Supervision or verbal cues   Transfers Chair/bed transfer   Chair/bed transfer method: Ambulatory Chair/bed transfer assist level: Touching or steadying assistance (Pt > 75%) Chair/bed transfer assistive device: Patent attorney     Max distance: 40 ft Assist level: Supervision or verbal cues   Wheelchair   Type: Manual Max wheelchair distance: 100 ft Assist Level: No help, No cues, assistive device, takes more than reasonable amount of time  Cognition Comprehension Comprehension assist level: Understands complex 90% of the time/cues 10% of the time  Expression Expression assist level: Expresses complex 90% of the time/cues < 10% of the time  Social Interaction Social Interaction assist level: Interacts appropriately 90% of the time - Needs monitoring or encouragement for participation or interaction.  Problem Solving Problem solving assist level: Solves basic 90% of the time/requires cueing < 10% of the time  Memory Memory assist level: Recognizes or recalls 90% of the time/requires cueing < 10% of the time    Medical Problem List and Plan: 1. Debilitation with immobility secondary to left displaced femoral neck  fracture status post left hip bipolar hemiarthroplasty direct anterior approach 11/11/2014.   Weightbearing as tolerated  -continue therapies 2. DVT Prophylaxis/Anticoagulation: Chronic Coumadin. Monitor for rebleeding episodes  INR remains supratherapeutic this AM 3. Pain Management: Hydrocodone and Robaxin as needed. Monitor mental status 4. Acute blood loss anemia.   Hb 9.6 on 5/16 (stable)  Will cont to monitor 5. Neuropsych: This patient is capable of making decisions on his own behalf. 6. Skin/Wound Care: Routine skin checks  Per surgery, hip dressing not to be removed or changed until follow up with surgery (~5/20) 7. Fluids/Electrolytes/Nutrition: Routine I&O   BMP within acceptable range on 5/16, will cont to monitor 8. Atrial fibrillation.   Amiodarone 4 mg twice a day  Cardizem CD 120 mg daily, increased to 180 by cardiology  If still in Afib in few weeks, plan for cardioversion  Follow-up cardiology services as needed 9 Diastolic congestive heart failure.   Lasix 20 mg daily, increased to 40 mg daily on 5/16.    Weigh patient daily.   Monitor for any signs of fluid overload 10. Hyperlipidemia. Lipitor 11. Constipation. Laxative assistance 12. Hypoalbuminemia  -protein supp 13. GU  Urinary retention/incontinence appears to have resolved, continue I/O caths when necessary  UA neg, urine culture with Citrobacter freundii on 5/12  Bactrim started 5/14-5/19  DC'd Flomax  PVR's pending 14. Insomnia  Low dose trazodone started PRN 5/12 15.  Cellulitis right dorsal wrist  Small pinpoint papule, stable  Patient currently on Bactrim  Will continue to monitor for improvement 16. Tremors  Likely secondary to medications  Will DC Claritin  Awaiting Cardiology input  LOS (Days) 6 A FACE TO FACE EVALUATION WAS PERFORMED  Kendra Hughes 11/22/2015 7:57 AM

## 2015-11-23 ENCOUNTER — Inpatient Hospital Stay (HOSPITAL_COMMUNITY): Payer: Medicare Other | Admitting: Physical Therapy

## 2015-11-23 ENCOUNTER — Inpatient Hospital Stay (HOSPITAL_COMMUNITY): Payer: Medicare Other | Admitting: Occupational Therapy

## 2015-11-23 ENCOUNTER — Inpatient Hospital Stay (HOSPITAL_COMMUNITY): Payer: Medicare Other

## 2015-11-23 LAB — BASIC METABOLIC PANEL
Anion gap: 10 (ref 5–15)
BUN: 15 mg/dL (ref 6–20)
CO2: 28 mmol/L (ref 22–32)
Calcium: 9 mg/dL (ref 8.9–10.3)
Chloride: 96 mmol/L — ABNORMAL LOW (ref 101–111)
Creatinine, Ser: 0.91 mg/dL (ref 0.44–1.00)
GFR calc Af Amer: >60 mL/min (ref >60)
GFR, EST NON AFRICAN AMERICAN: 54 mL/min — AB (ref >60)
GLUCOSE: 96 mg/dL (ref 65–99)
POTASSIUM: 3.9 mmol/L (ref 3.5–5.1)
Sodium: 134 mmol/L — ABNORMAL LOW (ref 135–145)

## 2015-11-23 LAB — PROTIME-INR
INR: 2.98 — AB (ref 0.00–1.49)
PROTHROMBIN TIME: 30.4 s — AB (ref 11.6–15.2)

## 2015-11-23 MED ORDER — SULFAMETHOXAZOLE-TRIMETHOPRIM 400-80 MG PO TABS
1.0000 | ORAL_TABLET | Freq: Two times a day (BID) | ORAL | Status: DC
  Administered 2015-11-23 – 2015-11-25 (×4): 1 via ORAL
  Filled 2015-11-23 (×4): qty 1

## 2015-11-23 MED ORDER — WARFARIN SODIUM 2 MG PO TABS
1.0000 mg | ORAL_TABLET | Freq: Once | ORAL | Status: AC
  Administered 2015-11-23: 1 mg via ORAL
  Filled 2015-11-23: qty 0.5

## 2015-11-23 NOTE — Progress Notes (Signed)
Occupational Therapy Session Note  Patient Details  Name: Kendra Hughes MRN: [id number] Date of Birth: 01-03-1923  Today's Date: 11/23/2015 OT Individual Time: 9794-8016 OT Individual Time Calculation (min): 60 min    Short Term Goals: Week 1:  OT Short Term Goal 1 (Week 1): STG=LTG d/t short anticipated LOS  Skilled Therapeutic Interventions/Progress Updates: ADL-retraining with focus on improved attention/memory during lower body dressing, functional mobility, postural correction during functional mobility, sit<>stand, dynamic standing balance and shower transfer (simulation) reinforcement training.   Pt received seated on BSC over toilet completing toilet hygiene and receptive for continued bathing/dressing while on BSC.   Pt requires only setup to provide supplies and min instructional cues to remind pt to dress LLE first.    Pt ambulated from bathroom to sink with min cues to improve posture d/t stooped posture at RW.   Pt rests briefly at EOB and then resume treatment with planned re-ed on simulated shower transfers.   Pt completes task using good technique as therapist simulated grab bars placed in her shower at her home.   Pt returned to her room and recovered to recliner to rest again.   HR assessed intermittently, averaging between 115-123 during activities.     Therapy Documentation Precautions:  Precautions Precautions: Fall Restrictions Weight Bearing Restrictions: Yes LLE Weight Bearing: Weight bearing as tolerated   Pain: No/denies pain   ADL: ADL ADL Comments: See Functional Assessment Tool  See Function Navigator for Current Functional Status.   Therapy/Group: Individual Therapy  Gil Ingwersen 11/23/2015, 3:01 PM

## 2015-11-23 NOTE — Progress Notes (Signed)
[facility] PHYSICAL MEDICINE & REHABILITATION     PROGRESS NOTE  Subjective/Complaints:  Patient sitting up in her chair this morning. She notes that she is already experiencing improving tremors after decreasing the amiodarone. She also notes improvement in cellulitis.  ROS:    + Small Tender (improving) papule right dorsal wrist, tremors (improving). Denies CP, SOB, N/V/D.  Objective: Vital Signs: Blood pressure 111/64, pulse 88, temperature 97.5 F (36.4 C), temperature source Oral, resp. rate 18, height 5\' 5"  (1.651 m), weight 69.5 kg (153 lb 3.5 oz), SpO2 98 %. No results found.  Recent Labs  11/21/15 0423  WBC 7.7  HGB 9.6*  HCT 28.9*  PLT 463*    Recent Labs  11/21/15 0423 11/23/15 0622  NA 134* 134*  K 3.6 3.9  CL 95* 96*  GLUCOSE 103* 96  BUN 15 15  CREATININE 0.73 0.91  CALCIUM 8.8* 9.0   CBG (last 3)  No results for input(s): GLUCAP in the last 72 hours.  Wt Readings from Last 3 Encounters:  11/23/15 69.5 kg (153 lb 3.5 oz)  11/10/15 70.308 kg (155 lb)  08/21/15 70.67 kg (155 lb 12.8 oz)    Physical Exam:  BP 111/64 mmHg  Pulse 88  Temp(Src) 97.5 F (36.4 C) (Oral)  Resp 18  Ht 5\' 5"  (1.651 m)  Wt 69.5 kg (153 lb 3.5 oz)  BMI 25.50 kg/m2  SpO2 98% Constitutional: She appears well-developed and well-nourished. NAD. HENT: Normocephalic and atraumatic.  Eyes: Conjunctivae and EOM are normal.  Cardiovascular: Irregularly irregular  Respiratory: Effort normal and breath sounds normal. No respiratory distress.  GI: Soft. Bowel sounds are normal. She exhibits no distension.  Musculoskeletal: She exhibits edema and tenderness.  Neurological: She is alert and oriented.  Motor:  Bilateral upper extremities: 4+-5/5 proximal to distal Right lower extremity: 4+/5 proximally, 5/5 distally Left lower extremity hip flexion 3-/5, ankle dorsi/plantar flexion 5/5  Skin: Hip incision with dressing with dried drainage, otherwise c/d/i. Small tender papule  right wrist (improving).  Psychiatric: She has a normal mood and affect. Her behavior is normal  Assessment/Plan: 1. Functional deficits secondary to left displaced femoral neck fracture status post left hip bipolar hemiarthroplasty which require 3+ hours per day of interdisciplinary therapy in a comprehensive inpatient rehab setting. Physiatrist is providing close team supervision and 24 hour management of active medical problems listed below. Physiatrist and rehab team continue to assess barriers to discharge/monitor patient progress toward functional and medical goals.  Function:  Bathing Bathing position   Position: Other (comment) (On BSC over toilet)  Bathing parts Body parts bathed by patient: Right arm, Left arm, Chest, Abdomen, Front perineal area, Buttocks, Right upper leg, Left upper leg, Right lower leg, Left lower leg Body parts bathed by helper: Front perineal area, Buttocks (per patient report done by nursing with catheter care)  Bathing assist Assist Level: More than reasonable time, Supervision or verbal cues      Upper Body Dressing/Undressing Upper body dressing   What is the patient wearing?: Bra, Pull over shirt/dress, Button up shirt Bra - Perfomed by patient: Thread/unthread right bra strap, Thread/unthread left bra strap, Hook/unhook bra (pull down sports bra)   Pull over shirt/dress - Perfomed by patient: Thread/unthread right sleeve, Thread/unthread left sleeve, Put head through opening, Pull shirt over trunk   Button up shirt - Perfomed by patient: Thread/unthread right sleeve, Thread/unthread left sleeve, Pull shirt around back      Upper body assist Assist Level: Set up  Set up : To obtain clothing/put away  Lower Body Dressing/Undressing Lower body dressing   What is the patient wearing?: Underwear, Pants, Shoes, Non-skid slipper socks Underwear - Performed by patient: Thread/unthread right underwear leg, Pull underwear up/down Underwear - Performed  by helper: Thread/unthread left underwear leg Pants- Performed by patient: Thread/unthread right pants leg, Pull pants up/down, Fasten/unfasten pants, Thread/unthread left pants leg Pants- Performed by helper: Thread/unthread left pants leg Non-skid slipper socks- Performed by patient: Don/doff right sock, Don/doff left sock Non-skid slipper socks- Performed by helper: Don/doff left sock     Shoes - Performed by patient: Don/doff right shoe, Don/doff left shoe            Lower body assist Assist for lower body dressing: Set up, Touching or steadying assistance (Pt > 75%)   Set up : To obtain clothing/put away  Toileting Toileting Toileting activity did not occur: No continent bowel/bladder event Toileting steps completed by patient: Adjust clothing prior to toileting, Adjust clothing after toileting Toileting steps completed by helper: Performs perineal hygiene, Adjust clothing after toileting Toileting Assistive Devices: Grab bar or rail  Toileting assist Assist level: Supervision or verbal cues   Transfers Chair/bed transfer   Chair/bed transfer method: Ambulatory Chair/bed transfer assist level: Touching or steadying assistance (Pt > 75%) Chair/bed transfer assistive device: Walker, Designer, fashion/clothing     Max distance: 175 Assist level: Touching or steadying assistance (Pt > 75%)   Wheelchair   Type: Manual Max wheelchair distance: 153ft Assist Level: Supervision or verbal cues  Cognition Comprehension Comprehension assist level: Understands complex 90% of the time/cues 10% of the time  Expression Expression assist level: Expresses complex 90% of the time/cues < 10% of the time  Social Interaction Social Interaction assist level: Interacts appropriately 90% of the time - Needs monitoring or encouragement for participation or interaction.  Problem Solving Problem solving assist level: Solves complex 90% of the time/cues < 10% of the time  Memory Memory  assist level: Recognizes or recalls 90% of the time/requires cueing < 10% of the time    Medical Problem List and Plan: 1. Debilitation with immobility secondary to left displaced femoral neck fracture status post left hip bipolar hemiarthroplasty direct anterior approach 11/11/2014.   Weightbearing as tolerated  -continue therapies  Will see patient for transitional care management in 1-2 weeks after discharge. 2. DVT Prophylaxis/Anticoagulation: Chronic Coumadin. Monitor for rebleeding episodes  INR therapeutic this AM 3. Pain Management: Hydrocodone and Robaxin as needed. Monitor mental status 4. Acute blood loss anemia.   Hb 9.6 on 5/16 (stable)  Will cont to monitor 5. Neuropsych: This patient is capable of making decisions on his own behalf. 6. Skin/Wound Care: Routine skin checks  Per surgery, hip dressing not to be removed or changed until follow up with surgery (~5/20)  Will schedule for follow-up appointment next week. 7. Fluids/Electrolytes/Nutrition: Routine I&O   BMP within acceptable range on 5/18, will cont to monitor 8. Atrial fibrillation.   Amiodarone 4 mg twice a day  Cardizem CD 120 mg daily, increased to 180 by cardiology  If still in Afib in few weeks, plan for cardioversion  Follow-up cardiology services as needed 9 Diastolic congestive heart failure.   Lasix 20 mg daily, increased to 40 mg daily on 5/16.    Weigh patient daily (stable around 70 KGs).   Monitor for any signs of fluid overload 10. Hyperlipidemia. Lipitor 11. Constipation. Laxative assistance 12. Hypoalbuminemia  -protein supp 13.  GU  Urinary retention/incontinence appears to have resolved, continue I/O caths when necessary  UA neg, urine culture with Citrobacter freundii on 5/12  Bactrim started 5/14-5/21  DC'd Flomax  PVR's 0 14. Insomnia  Low dose trazodone started PRN 5/12 15. Cellulitis right dorsal wrist (improving)  Small pinpoint papule, stable  Patient currently on  Bactrim  Will continue to monitor for improvement 16. Tremors (improving)   Likely secondary to medications  Will DC Claritin  Appreciate cardiology assistance. Amiodarone decreased, beta blocker re-initiated  LOS (Days) 7 A FACE TO FACE EVALUATION WAS PERFORMED  Yarethzy Croak Karis Juba 11/23/2015 8:20 AM

## 2015-11-23 NOTE — Progress Notes (Signed)
ANTICOAGULATION CONSULT NOTE - Follow Up Consult  Pharmacy Consult for coumadin Indication: atrial fibrillation  No Known Allergies  Patient Measurements: Height: 5\' 5"  (165.1 cm) Weight: 153 lb 3.5 oz (69.5 kg) IBW/kg (Calculated) : 57 Heparin Dosing Weight:   Vital Signs: Temp: 97.5 F (36.4 C) (05/18 0543) Temp Source: Oral (05/18 0543) BP: 111/64 mmHg (05/18 0543) Pulse Rate: 88 (05/18 0543)  Labs:  Recent Labs  11/21/15 0423 11/22/15 0425 11/23/15 0622  HGB 9.6*  --   --   HCT 28.9*  --   --   PLT 463*  --   --   LABPROT 33.1* 33.8* 30.4*  INR 3.33* 3.43* 2.98*  CREATININE 0.73  --  0.91    Estimated Creatinine Clearance: 40.2 mL/min (by C-G formula based on Cr of 0.91).   Medications:  Scheduled:  . amiodarone  200 mg Oral Daily  . atorvastatin  10 mg Oral q1800  . diclofenac sodium  2 g Topical QID  . diltiazem  180 mg Oral Daily  . fluticasone  1 spray Each Nare Daily  . folic acid  1 mg Oral Daily  . furosemide  40 mg Oral Daily  . metoprolol tartrate  12.5 mg Oral BID  . protein supplement  1 scoop Oral TID WC  . sulfamethoxazole-trimethoprim  1 tablet Oral Q12H  . Vitamin D (Ergocalciferol)  50,000 Units Oral Weekly  . Warfarin - Pharmacist Dosing Inpatient   Does not apply q1800   Infusions:    Assessment: 82 yo female with afib is currently on therapeutic coumadin.  INR is down to 2.98 from 3.43.  On amiodarone and bactrim.  Goal of Therapy:  INR 2-3 Monitor platelets by anticoagulation protocol: Yes   Plan:  - coumadin 1 mg po x1  - INR in am  Beckey Polkowski, Tsz-Yin 11/23/2015,8:16 AM

## 2015-11-23 NOTE — Progress Notes (Signed)
Patient Name: Kendra Hughes Date of Encounter: 11/23/2015  Principal Problem:   Femoral neck fracture (HCC) Active Problems:   Atrial fibrillation with rapid ventricular response (HCC)   Primary osteoarthritis of right knee   S/P hip hemiarthroplasty   Hypoalbuminemia due to protein-calorie malnutrition (HCC)   Urinary retention   Absence of bladder continence   Insomnia   UTI (lower urinary tract infection)   Cellulitis of right upper extremity   Tremors of nervous system   Acute on chronic diastolic congestive heart failure (HCC)   Length of Stay: 7  SUBJECTIVE  Anticipated DC on Saturday. Little change from last evening.  CURRENT MEDS . amiodarone  200 mg Oral Daily  . atorvastatin  10 mg Oral q1800  . diclofenac sodium  2 g Topical QID  . diltiazem  180 mg Oral Daily  . fluticasone  1 spray Each Nare Daily  . folic acid  1 mg Oral Daily  . furosemide  40 mg Oral Daily  . metoprolol tartrate  12.5 mg Oral BID  . protein supplement  1 scoop Oral TID WC  . sulfamethoxazole-trimethoprim  1 tablet Oral Q12H  . Vitamin D (Ergocalciferol)  50,000 Units Oral Weekly  . warfarin  1 mg Oral ONCE-1800  . Warfarin - Pharmacist Dosing Inpatient   Does not apply q1800    OBJECTIVE   Intake/Output Summary (Last 24 hours) at 11/23/15 1132 Last data filed at 11/23/15 0800  Gross per 24 hour  Intake    720 ml  Output      0 ml  Net    720 ml   Filed Weights   11/21/15 0503 11/22/15 0547 11/23/15 0543  Weight: 71.26 kg (157 lb 1.6 oz) 68.901 kg (151 lb 14.4 oz) 69.5 kg (153 lb 3.5 oz)    PHYSICAL EXAM Filed Vitals:   11/22/15 1103 11/22/15 1436 11/22/15 2005 11/23/15 0543  BP: 127/84 102/55 109/73 111/64  Pulse:  96 114 88  Temp:  98 F (36.7 C)  97.5 F (36.4 C)  TempSrc:  Oral  Oral  Resp:  18  18  Height:      Weight:    69.5 kg (153 lb 3.5 oz)  SpO2:  98%  98%   General: Alert, oriented x3, no distress Head: no evidence of trauma, PERRL, EOMI, no exophtalmos  or lid lag, no myxedema, no xanthelasma; normal ears, nose and oropharynx Neck: normal jugular venous pulsations and no hepatojugular reflux; brisk carotid pulses without delay and no carotid bruits Chest: clear to auscultation, no signs of consolidation by percussion or palpation, normal fremitus, symmetrical and full respiratory excursions Cardiovascular: normal position and quality of the apical impulse, irregular rhythm, normal first and second heart sounds, no rubs or gallops, no murmur Abdomen: no tenderness or distention, no masses by palpation, no abnormal pulsatility or arterial bruits, normal bowel sounds, no hepatosplenomegaly Extremities: no clubbing, cyanosis or edema; 2+ radial, ulnar and brachial pulses bilaterally; 2+ right femoral, posterior tibial and dorsalis pedis pulses; 2+ left femoral, posterior tibial and dorsalis pedis pulses; no subclavian or femoral bruits Neurological: grossly nonfocal  LABS  CBC  Recent Labs  11/21/15 0423  WBC 7.7  NEUTROABS 6.1  HGB 9.6*  HCT 28.9*  MCV 90.0  PLT 463*   Basic Metabolic Panel  Recent Labs  11/21/15 0423 11/23/15 0622  NA 134* 134*  K 3.6 3.9  CL 95* 96*  CO2 29 28  GLUCOSE 103* 96  BUN 15 15  CREATININE 0.73 0.91  CALCIUM 8.8* 9.0    ASSESSMENT AND PLAN  Little change in heart rate or tremors (anticipate it may take weeks to see a difference). Rate control remains adequate. Have asked our staff to make her an appointment in Cardiology office in next 2 weeks. Consider elective outpatient cardioversion versus switching entirely to a rate control strategy and discontinuation of amiodarone, depending on HR and tremor severity/other side effects at that time.  Thurmon Fair, MD, [facility] [phone] office [phone] pager 11/23/2015 11:32 AM

## 2015-11-23 NOTE — Progress Notes (Signed)
Occupational Therapy Session Note  Patient Details  Name: GINIA RUDELL MRN: [id number] Date of Birth: 06/02/1923  Today's Date: 11/23/2015 OT Individual Time: 1101-1159 OT Individual Time Calculation (min): 58 min    Short Term Goals: Week 1:  OT Short Term Goal 1 (Week 1): STG=LTG d/t short anticipated LOS  Skilled Therapeutic Interventions/Progress Updates:    Upon entering the room, pt seated in wheelchair awaiting therapist arrival. Pt with c/o 2/10 pain in B LEs described as soreness. Pt agreeable to OT intervention. Pt with paper handout of B UE strengthening exercises present in room. Pt utilized handout with min demonstrational and verbal cues for pt to perform proper technique. Pt performed 3 sets of 10 of each exercise with rest breaks for fatigue. Focus on pursed lip breathing with exercises as pt was observed to be holding breath at times. Pt engaged in wheelchair push ups with focus on controlled sit as well with 3 sets of 5. Pt performed with min cues for breathing technique during exercise. PT remained in wheelchair at end of session with call bell and all needed items within reach upon exiting the room.   Therapy Documentation Precautions:  Precautions Precautions: Fall Restrictions Weight Bearing Restrictions: Yes LLE Weight Bearing: Weight bearing as tolerated Pain: Pain Assessment Pain Assessment: No/denies pain ADL: ADL ADL Comments: See Functional Assessment Tool  See Function Navigator for Current Functional Status.   Therapy/Group: Individual Therapy  Lowella Grip 11/23/2015, 12:31 PM

## 2015-11-23 NOTE — Progress Notes (Signed)
Physical Therapy Session Note  Patient Details  Name: Kendra Hughes MRN: [id number] Date of Birth: 05-24-1923  Today's Date: 11/23/2015 PT Individual Time: 1330-1445 PT Individual Time Calculation (min): 75 min   Short Term Goals: Week 1:  PT Short Term Goal 1 (Week 1): LTG = STG due to estimated D/C date.   Skilled Therapeutic Interventions/Progress Updates:    Patient received sitting in [facility] and agreeable to PT.  PT transported patient Solarium with total A for energy conservation. Patient performed gait training in solarium for 32f x 2 with seated rest break in between. Patient performed sit<>stand to low sitting surface with min A from PT and required 2 attempts with mod cues for LE placement prior to transfer. Gait training in controlled environment for 1539fx 2 with supervision A from PT. With min cues to keep RW closer to COM. Gait training on uneven cement and paved sidewalk for 7553f 2. With close supervision A from PT and cues for AD management with cracks in sidewalk. Gait training also performed in community eviromment including carpeted gift shop for [id number] close supervision A and min A x 1 to prevent lateral LOB with sharp turn to R.  [facility] other [facility]<>[facility] surfaces performed with Close supervision A x 8 throughout treatment and min cues for LE placement and improved weight shift  WC mobility performed for [id number] food court with supervision A from PT with min cues for obstacle navigation  Stair training for 4 steps x 2 with min A for first bout and supervision A on second. Mod verbal cues for proper UE placement and proper step-to gait pattern with ascent as well as min cues for improve LLE placement with descent to allow room for RLE. Patient able to apply and maintain instruction from PT for stair training for approx 75% of the time.   Endurance training on Nustep level 4 for 10 minutes with 1 rest break and min cues to maintian  steps per minute >25 to improved muscular and cardiovascular endurance.  [facility] and all other needs met.   Therapy Documentation Precautions:  Precautions Precautions: Fall Restrictions Weight Bearing Restrictions: Yes LLE Weight Bearing: Weight bearing as tolerated General:   Vital Signs: Therapy Vitals Temp: 98.5 F (36.9 C) Temp Source: Oral Pulse Rate: 83 Resp: 18 BP: 107/61 mmHg Patient Position (if appropriate): Sitting Oxygen Therapy SpO2: 98 % O2 Device: Not Delivered Pain:   0/10   See Function Navigator for Current Functional Status.   Therapy/Group: Individual Therapy  AustiLorie Phenix/2017, 4:07 PM

## 2015-11-23 NOTE — Progress Notes (Signed)
Patient Name: Kendra Hughes Date of Encounter: 11/23/2015  Hospital Problem List     Principal Problem:   Femoral neck fracture [facility]) Active Problems:   Atrial fibrillation with rapid ventricular response (HCC)   Primary osteoarthritis of right knee   S/P hip hemiarthroplasty   Hypoalbuminemia due to protein-calorie malnutrition (HCC)   Urinary retention   Absence of bladder continence   Insomnia   UTI (lower urinary tract infection)   Cellulitis of right upper extremity   Tremors of nervous system   Acute on chronic diastolic congestive heart failure (HCC)    Subjective   No chest pain, sob, palpitations.  Still w/ tremors.  Reduced amio dose to start this AM.  Inpatient Medications    . amiodarone  200 mg Oral Daily  . atorvastatin  10 mg Oral q1800  . diclofenac sodium  2 g Topical QID  . diltiazem  180 mg Oral Daily  . fluticasone  1 spray Each Nare Daily  . folic acid  1 mg Oral Daily  . furosemide  40 mg Oral Daily  . metoprolol tartrate  12.5 mg Oral BID  . protein supplement  1 scoop Oral TID WC  . sulfamethoxazole-trimethoprim  1 tablet Oral Q12H  . Vitamin D (Ergocalciferol)  50,000 Units Oral Weekly  . Warfarin - Pharmacist Dosing Inpatient   Does not apply q1800    Vital Signs    Filed Vitals:   11/22/15 1103 11/22/15 1436 11/22/15 2005 11/23/15 0543  BP: 127/84 102/55 109/73 111/64  Pulse:  96 114 88  Temp:  98 F (36.7 C)  97.5 F (36.4 C)  TempSrc:  Oral  Oral  Resp:  18  18  Height:      Weight:    153 lb 3.5 oz (69.5 kg)  SpO2:  98%  98%    Intake/Output Summary (Last 24 hours) at 11/23/15 0736 Last data filed at 11/23/15 0310  Gross per 24 hour  Intake    720 ml  Output      0 ml  Net    720 ml   Filed Weights   11/21/15 0503 11/22/15 0547 11/23/15 0543  Weight: 157 lb 1.6 oz (71.26 kg) 151 lb 14.4 oz (68.901 kg) 153 lb 3.5 oz (69.5 kg)    Physical Exam    General: Pleasant, NAD. Neuro: Alert and oriented X 3. Moves all  extremities spontaneously. Psych: Normal affect. HEENT:  Normal  Neck: Supple without bruits or JVD. Lungs:  Resp regular and unlabored, CTA. Heart: IR, IR no s3, s4, or murmurs. Abdomen: Soft, non-tender, non-distended, BS + x 4.  Extremities: No clubbing, cyanosis or edema. DP/PT/Radials 2+ and equal bilaterally.  Labs    CBC  Recent Labs  11/21/15 0423  WBC 7.7  NEUTROABS 6.1  HGB 9.6*  HCT 28.9*  MCV 90.0  PLT 463*   Basic Metabolic Panel  Recent Labs  11/21/15 0423 11/23/15 0622  NA 134* 134*  K 3.6 3.9  CL 95* 96*  CO2 29 28  GLUCOSE 103* 96  BUN 15 15  CREATININE 0.73 0.91  CALCIUM 8.8* 9.0    Radiology    Dg Chest Port 1 View  11/12/2015  CLINICAL DATA:  82 year old female with tachycardia and AFib EXAM: PORTABLE CHEST 1 VIEW COMPARISON:  Chest radiograph dated 08/19/2013 and CT dated 10/07/2009 FINDINGS: Single portable view of the chest demonstrates emphysematous changes of the lungs with areas of stable scarring at the apices. There is  no focal consolidation. There is mild blunting of the left costophrenic angle which may represent a small left pleural effusion. There is no pneumothorax with stable mild cardiomegaly. No acute osseous pathology identified. IMPRESSION: Emphysema with probable small left pleural effusion. No focal consolidation or pneumothorax. Electronically Signed   By: Elgie Collard M.D.   On: 11/12/2015 18:47   Dg Hip Port Unilat With Pelvis 1v Left  11/10/2015  CLINICAL DATA:  Closed left hip fracture, initial visit. EXAM: DG HIP (WITH OR WITHOUT PELVIS) 1V PORT LEFT COMPARISON:  Left hip series of Nov 09, 2015 at 2200 hours. FINDINGS: The bony pelvis is diffusely osteopenic. No acute pelvic fracture is observed. There is an acute transverse subcapital fracture of the left hip with varus angulation. There is persistent superior migration of the femoral neck and a remainder of the femur superiorly with respect to the femoral head and  acetabulum. There is narrowing of the left hip joint space similar to that seen on the right. IMPRESSION: Acute subcapsular fracture of the left hip with superior migration of the left hip and femur. Electronically Signed   By: David  [place] M.D.   On: 11/10/2015 08:29   Dg Hip Operative Unilat W Or W/o Pelvis Left  11/11/2015  CLINICAL DATA:  Femoral neck fracture. Initial encounter. EXAM: OPERATIVE left HIP (WITH PELVIS IF PERFORMED) AP VIEWS TECHNIQUE: Fluoroscopic spot image(s) were submitted for interpretation post-operatively. COMPARISON:  Radiography from yesterday FINDINGS: Bipolar left hip hemiarthroplasty without evidence of periprosthetic fracture or dislocation. IMPRESSION: Fluoroscopy for left hip hemiarthroplasty.  No adverse finding. Electronically Signed   By: Marnee Spring M.D.   On: 11/11/2015 09:27    Assessment & Plan    1. Left Femoral Neck Fracture: S/p left hemiarthroplasty on 5/6. Recovering well in rehab.  2. Persistent Atrial Fibrillation: H/o PAF with persistent AF since 10/2013  rate controlled and anticoagulated with coumadin as an outpt. In the setting of #1, she had rapid rates following admission and was placed on amiodarone for rate control. Consideration was given to TEE/DCCV given tachycardia however rates improved on IV amio and she was subsequently switched to PO amio. She is mildly anemic, though stable. TSH nl. Renal fxn stable. At baseline, she has a very mild tremor but since Sunday, this has worsened and is especially noticeable with intention. ? Role of Amio  reduced to 200mg  daily, beginning today and  blocker blocker added back (lopressor 12.5 bid).  BP stable.  HR's trending 90's to 1-teens overnight per VS recordings (not on tele).  Hopefully will be able to get her off of amio and back on previous dose of  blocker as she continues to recover from hip surgery. Cont PO dilt.  3. Chronic diastolic chf: Nl EF by echo 5/10. Wt up slightly to 153  this AM.  Remains euvolemic on exam.  Currently on lasix 40 daily - prev on 20 daily @ home.  BP stable.  HR relatively stable on lower dose amio, dilt, and  blocker.  Signed, NP

## 2015-11-24 ENCOUNTER — Inpatient Hospital Stay (HOSPITAL_COMMUNITY): Payer: Medicare Other | Admitting: Physical Therapy

## 2015-11-24 ENCOUNTER — Inpatient Hospital Stay (HOSPITAL_COMMUNITY): Payer: Medicare Other

## 2015-11-24 LAB — PROTIME-INR
INR: 2.79 — ABNORMAL HIGH (ref 0.00–1.49)
Prothrombin Time: 29 seconds — ABNORMAL HIGH (ref 11.6–15.2)

## 2015-11-24 MED ORDER — DILTIAZEM HCL ER COATED BEADS 180 MG PO CP24: 180.0000 mg | ORAL_CAPSULE | Freq: Every day | ORAL | Status: DC

## 2015-11-24 MED ORDER — WARFARIN SODIUM 2 MG PO TABS: 1.0000 mg | ORAL_TABLET | Freq: Once | ORAL | Status: DC

## 2015-11-24 MED ORDER — WARFARIN SODIUM 1 MG PO TABS: 1.0000 mg | ORAL_TABLET | Freq: Every day | ORAL | Status: DC

## 2015-11-24 MED ORDER — METOPROLOL TARTRATE 25 MG PO TABS: 12.5000 mg | ORAL_TABLET | Freq: Two times a day (BID) | ORAL | Status: DC

## 2015-11-24 MED ORDER — OXYCODONE-ACETAMINOPHEN 5-325 MG PO TABS: 1.0000 | ORAL_TABLET | ORAL | Status: DC | PRN

## 2015-11-24 MED ORDER — DICLOFENAC SODIUM 1 % TD GEL: 2.0000 g | Freq: Four times a day (QID) | TRANSDERMAL | Status: DC

## 2015-11-24 MED ORDER — SULFAMETHOXAZOLE-TRIMETHOPRIM 400-80 MG PO TABS: 1.0000 | ORAL_TABLET | Freq: Two times a day (BID) | ORAL | Status: DC

## 2015-11-24 MED ORDER — ATORVASTATIN CALCIUM 10 MG PO TABS: 10.0000 mg | ORAL_TABLET | Freq: Every day | ORAL | Status: DC

## 2015-11-24 MED ORDER — VITAMIN D (ERGOCALCIFEROL) 1.25 MG (50000 UNIT) PO CAPS: 50000.0000 [IU] | ORAL_CAPSULE | ORAL | Status: DC

## 2015-11-24 MED ORDER — WARFARIN SODIUM 1 MG PO TABS
1.0000 mg | ORAL_TABLET | Freq: Once | ORAL | Status: AC
  Administered 2015-11-24: 1 mg via ORAL
  Filled 2015-11-24: qty 1
  Filled 2015-11-24: qty 0.5

## 2015-11-24 MED ORDER — FOLIC ACID 800 MCG PO TABS: 800.0000 ug | ORAL_TABLET | Freq: Every day | ORAL | Status: DC

## 2015-11-24 MED ORDER — FUROSEMIDE 40 MG PO TABS: 40.0000 mg | ORAL_TABLET | Freq: Every day | ORAL | Status: DC

## 2015-11-24 MED ORDER — AMIODARONE HCL 200 MG PO TABS: 200.0000 mg | ORAL_TABLET | Freq: Every day | ORAL | Status: DC

## 2015-11-24 MED ORDER — METHOCARBAMOL 500 MG PO TABS: 500.0000 mg | ORAL_TABLET | Freq: Four times a day (QID) | ORAL | Status: DC | PRN

## 2015-11-24 NOTE — Progress Notes (Signed)
Occupational Therapy Discharge Summary  Patient Details  Name: Kendra Hughes MRN: [id number] Date of Birth: January 26, 1923   Patient has met 9 of 9 long term goals due to improved activity tolerance, improved balance, ability to compensate for deficits and improved awareness.  Patient to discharge at overall Supervision level.  Patient's care partner is independent to provide the necessary cognitive assistance at discharge.   Patient demonstrated consistent gains in performance of BADL during CIR admission although challenged with new learning required to use AE skillfully.  Pt deferred recommendation to address performance in role of home making due to her assurance of her husband's and extended family's ability to assume roles in home making  as needed while patient continues recovery.   Reasons goals not met: n/a  Recommendation:  Patient will not require ongoing skilled OT services to advance functional skills in the area of BADL and iADL.  Equipment: BSC  Reasons for discharge: treatment goals met  Patient/family agrees with progress made and goals achieved: Yes  OT Discharge Precautions/Restrictions  Precautions Precautions: Fall Restrictions Weight Bearing Restrictions: No LLE Weight Bearing: Weight bearing as tolerated  Pain Pain Assessment Pain Assessment: No/denies pain  ADL ADL ADL Comments: see Functional Assessment Tool  Vision/Perception  Vision- History Baseline Vision/History: Wears glasses Wears Glasses: Reading only Patient Visual Report: No change from baseline Vision- Assessment Vision Assessment?: No apparent visual deficits   Cognition Overall Cognitive Status: Within Functional Limits for tasks assessed Arousal/Alertness: Awake/alert Orientation Level: Oriented X4 Attention: Alternating Alternating Attention: Appears intact Memory: Appears intact Awareness: Appears intact Problem Solving: Appears intact (Intact during familar tasks; impaired with  new learning) Safety/Judgment: Appears intact Comments: Slight cueing required for safety with transfers when fatigued  Sensation Sensation Light Touch: Appears Intact Stereognosis: Appears Intact Hot/Cold: Appears Intact Proprioception: Appears Intact Coordination Gross Motor Movements are Fluid and Coordinated: Yes Fine Motor Movements are Fluid and Coordinated: No (Persistent mild-moderate intention tremors impair coordination/FMC) Heel Shin Test: WNL with available ROM.   Motor  Motor Motor: Within Functional Limits Motor - Discharge Observations: Persistent tremor noted @ BUE   Mobility  Bed Mobility Bed Mobility: Rolling Right;Rolling Left;Left Sidelying to Sit;Supine to Sit;Sitting - Scoot to [employer] of Bed;Sit to Supine Rolling Right: 6: Modified independent (Device/Increase time) Rolling Left: 6: Modified independent (Device/Increase time) Left Sidelying to Sit: 6: Modified independent (Device/Increase time) Supine to Sit: 6: Modified independent (Device/Increase time) Sitting - Scoot to Edge of Bed: 6: Modified independent (Device/Increase time) Sit to Supine: 6: Modified independent (Device/Increase time) Transfers Transfers: Sit to Stand;Stand to Sit Sit to Stand: 6: Modified independent (Device/Increase time) Stand to Sit: 6: Modified independent (Device/Increase time)   Trunk/Postural Assessment  Cervical Assessment Cervical Assessment: Within Functional Limits Thoracic Assessment Thoracic Assessment: Within Functional Limits Lumbar Assessment Lumbar Assessment: Within Functional Limits Postural Control Postural Control: Within Functional Limits   Balance Balance Balance Assessed: Yes Standardized Balance Assessment Standardized Balance Assessment: Berg Balance Test Berg Balance Test Sit to Stand: Able to stand  independently using hands Standing Unsupported: Able to stand safely 2 minutes Sitting with Back Unsupported but Feet Supported on Floor or  Stool: Able to sit safely and securely 2 minutes Stand to Sit: Controls descent by using hands Transfers: Able to transfer safely, definite need of hands Standing Unsupported with Eyes Closed: Able to stand 10 seconds safely Standing Ubsupported with Feet Together: Able to place feet together independently and stand 1 minute safely From Standing, Reach Forward with Outstretched Arm: Can reach  confidently >25 cm (10") From Standing Position, Pick up Object from Floor: Able to pick up shoe, needs supervision From Standing Position, Turn to Look Behind Over each Shoulder: Turn sideways only but maintains balance Turn 360 Degrees: Needs assistance while turning Standing Unsupported, Alternately Place Feet on Step/Stool: Needs assistance to keep from falling or unable to try Standing Unsupported, One Foot in Front: Needs help to step but can hold 15 seconds Standing on One Leg: Tries to lift leg/unable to hold 3 seconds but remains standing independently Total Score: 36 Static Sitting Balance Static Sitting - Balance Support: Feet supported Static Sitting - Level of Assistance: 7: Independent Dynamic Sitting Balance Dynamic Sitting - Balance Support: Feet supported Dynamic Sitting - Level of Assistance: 6: Modified independent (Device/Increase time) Dynamic Sitting - Balance Activities: Reaching for objects;Forward lean/weight shifting Static Standing Balance Static Standing - Balance Support: No upper extremity supported Static Standing - Level of Assistance: 6: Modified independent (Device/Increase time) Dynamic Standing Balance Dynamic Standing - Balance Support: Right upper extremity supported Dynamic Standing - Level of Assistance: 5: Stand by assistance  Extremity/Trunk Assessment RUE Assessment RUE Assessment: Within Functional Limits LUE Assessment LUE Assessment: Within Functional Limits   See Function Navigator for Current Functional Status.  [facility] 11/26/2015, 6:52  AM

## 2015-11-24 NOTE — Plan of Care (Signed)
Problem: RH Bed to Chair Transfers Goal: LTG Patient will perform bed/chair transfers w/assist (PT) LTG: Patient will perform bed/chair transfers with assistance, with/without cues (PT).  Outcome: Not Met (add Reason) Requires min cues for safety.   Problem: RH Furniture Transfers Goal: LTG Patient will perform furniture transfers w/assist (OT/PT LTG: Patient will perform furniture transfers with assistance (OT/PT).  Outcome: Not Met (add Reason) Requires min cues for safety.

## 2015-11-24 NOTE — Progress Notes (Signed)
Social Work  Discharge Note  The overall goal for the admission was met for:   Discharge location: Yes-HOME WITH HUSBAND WHO CAN PROVIDE SUPERVISION LEVEL  Length of Stay: Yes-9 DAYS  Discharge activity level: Yes-MOD/I-SUPERVISION -[place]  Home/community participation: Yes  Services provided included: MD, RD, PT, OT, RN, CM, TR, Pharmacy and [place]: Medicare and Private Insurance: [place]  Follow-up services arranged: Home Health: [place], DME: [place] and Patient/Family has no preference for HH/DME agencies  Comments (or additional information):PT DID [place], [place]  Patient/Family verbalized understanding of follow-up arrangements: Yes  Individual responsible for coordination of the follow-up plan: SELF & WADE-HUSBAND  Confirmed correct DME delivered: Elease Hashimoto 11/24/2015    Elease Hashimoto

## 2015-11-24 NOTE — Progress Notes (Signed)
Physical Therapy Discharge Summary  Patient Details  Name: Kendra Hughes MRN: [id number] Date of Birth: [birth year]  Today's Date: 11/24/2015 PT Individual Time: 1300-1415 PT Individual Time Calculation (min): 75 min    Patient has met 8 of 10 long term goals due to improved activity tolerance, improved balance, improved postural control, increased strength, increased range of motion, decreased pain and improved attention.  Patient to discharge at an ambulatory level Supervision.   Patient's care partner is independent to provide the necessary physical and cognitive assistance at discharge.  Reasons goals not met: Patient continues to require cues for safety with transfers, preventing Mod I level with AD.   Recommendation:  Patient will benefit from ongoing skilled PT services in home health setting to continue to advance safe functional mobility, address ongoing impairments in Strength, balance, safety, endurance, and minimize fall risk.  Equipment: Wheel chair, Environmental consultant  Reasons for discharge: treatment goals met and discharge from hospital  Patient/family agrees with progress made and goals achieved: Yes   PT Treatment:  Patient instructed in grad day assessment: see below for results. Patient also performed Car transfer x 2 with supervision A; x 1 with PT and x 1 with son. Min cues for son for proper positioning and to cue patient for safety with sit>stand to prevent holding onto moving door. Stair training with patient for 4 steps using BUE on one rail x 2 with supervision A; x 1 from PT and x 1 with son for guarding. Patient required min cues for LE and UE placement to improve safety of ascent as well as min cues for proper step to gait pattern.  PT instructed patient in Berg balance test: see below for results. Gait training on uneven surface for 10 ft with RW on close supervision A from PT.  Patient returned to room and left sitting in [facility] with sone and husband present.    PT  Discharge Precautions/Restrictions Restrictions Weight Bearing Restrictions: Yes LLE Weight Bearing: Weight bearing as tolerated Vital Signs  Pain Pain Assessment Pain Assessment: No/denies pain Pain Score: 0-No pain Vision/Perception     Cognition Orientation Level: Oriented X4 Memory: Appears intact Awareness: Appears intact Problem Solving: Appears intact Safety/Judgment: Appears intact Comments: Slight cueing required for safety with transfers when fatigued Sensation Sensation Light Touch: Appears Intact Hot/Cold: Appears Intact Proprioception: Appears Intact Coordination Gross Motor Movements are Fluid and Coordinated: Yes Fine Motor Movements are Fluid and Coordinated: No Motor  Motor Motor: Within Functional Limits  Mobility Bed Mobility Rolling Right: 6: Modified independent (Device/Increase time) Rolling Left: 6: Modified independent (Device/Increase time) Left Sidelying to Sit: 6: Modified independent (Device/Increase time) Supine to Sit: 6: Modified independent (Device/Increase time) Sitting - Scoot to Edge of Bed: 6: Modified independent (Device/Increase time) Sit to Supine: 6: Modified independent (Device/Increase time) Transfers Transfers: Yes Sit to Stand: 6: Modified independent (Device/Increase time) Stand to Sit: 6: Modified independent (Device/Increase time) Stand Pivot Transfers: 5: Supervision Stand Pivot Transfer Details: Verbal cues for precautions/safety Locomotion  Ambulation Ambulation: Yes Ambulation/Gait Assistance: 5: Supervision Ambulation Distance (Feet): 150 Feet Assistive device: Rolling walker Gait Gait: Yes Gait Pattern: Impaired Gait Pattern: Decreased step length - right;Antalgic Stairs / Additional Locomotion Stairs: Yes Stairs Assistance: 5: Supervision Stairs Assistance Details: Verbal cues for technique;Verbal cues for precautions/safety Stair Management Technique: One rail Left Number of Stairs: 4 Height of Stairs:  6 Curb: 5: Building services engineer Mobility: Yes Wheelchair Assistance: 6: Modified independent (Device/Increase time) Environmental health practitioner: Both upper extremities Wheelchair  Parts Management: Independent Distance: 150  Trunk/Postural Assessment  Cervical Assessment Cervical Assessment: Within Functional Limits Thoracic Assessment Thoracic Assessment: Within Functional Limits Lumbar Assessment Lumbar Assessment: Within Functional Limits Postural Control Postural Control: Within Functional Limits  Balance Berg Balance Test Sit to Stand: Able to stand  independently using hands Standing Unsupported: Able to stand safely 2 minutes Sitting with Back Unsupported but Feet Supported on Floor or Stool: Able to sit safely and securely 2 minutes Stand to Sit: Controls descent by using hands Transfers: Able to transfer safely, definite need of hands Standing Unsupported with Eyes Closed: Able to stand 10 seconds safely Standing Ubsupported with Feet Together: Able to place feet together independently and stand 1 minute safely From Standing, Reach Forward with Outstretched Arm: Can reach confidently >25 cm (10") From Standing Position, Pick up Object from Floor: Able to pick up shoe, needs supervision From Standing Position, Turn to Look Behind Over each Shoulder: Turn sideways only but maintains balance Turn 360 Degrees: Needs assistance while turning Standing Unsupported, Alternately Place Feet on Step/Stool: Needs assistance to keep from falling or unable to try Standing Unsupported, One Foot in Front: Needs help to step but can hold 15 seconds Standing on One Leg: Tries to lift leg/unable to hold 3 seconds but remains standing independently Total Score: 36 Static Sitting Balance Static Sitting - Level of Assistance: 7: Independent Dynamic Sitting Balance Dynamic Sitting - Level of Assistance: 6: Modified independent (Device/Increase time) Static Standing  Balance Static Standing - Level of Assistance: 6: Modified independent (Device/Increase time) Dynamic Standing Balance Dynamic Standing - Balance Support: Right upper extremity supported Dynamic Standing - Level of Assistance: 5: Stand by assistance Extremity Assessment      RLE Assessment RLE Assessment: Within Functional Limits LLE Assessment LLE Assessment: Within Functional Limits (4+/5 for hip and knee motions. )   See Function Navigator for Current Functional Status.  Lorie Phenix 11/24/2015, 2:15 PM

## 2015-11-24 NOTE — Discharge Summary (Signed)
Discharge summary job [id number]

## 2015-11-24 NOTE — Progress Notes (Signed)
Occupational Therapy Session Note  Patient Details  Name: Kendra Hughes MRN: [id number] Date of Birth: 12/22/1922  Today's Date: 11/24/2015 OT Individual Time: 3382-5053 OT Individual Time Calculation (min): 60 min    Short Term Goals: Week 1:  OT Short Term Goal 1 (Week 1): STG=LTG d/t short anticipated LOS  Skilled Therapeutic Interventions/Progress Updates: ADL-retraining with focus on reinforcement of successful sequencing during BADL at chair or toilet, functional transfers and discharge planning.   Pt received supine in bed and able to rise to eat her breakfast while sitting at EOB without assist.   Pt requested setup for bathing and dressing at toilet and after ambulating to bathroom with supervision performed task with min instructional cues to remove non-skid sock and dress left LE before right.   Pt groomed sitting at sink without assist.   During discussion of discharge, pt reaffirms that her husband will attend to iADL and supervise pt with her HEP with enthusiasm.     Pt declared that her admission to CIR and treatment provided has been of great benefit to both her and her husband.  Pt returned to recliner and was left with call light and phone within reach.        Therapy Documentation Precautions:  Precautions Precautions: Fall Restrictions Weight Bearing Restrictions: No LLE Weight Bearing: Weight bearing as tolerated  Vital Signs: Therapy Vitals Temp: 97.8 F (36.6 C) Temp Source: Oral Pulse Rate: 72 Resp: 17 BP: (!) 106/48 mmHg Patient Position (if appropriate): Lying Oxygen Therapy SpO2: 94 % O2 Device: Not Delivered   Pain: Pain Assessment Pain Assessment: 0-10 Pain Score: 4  Pain Type: Other (Comment) Pain Location: Generalized Pain Orientation: Other (Comment) (muscle soreness, primarily lower legs) Pain Intervention(s): Repositioned;Distraction Multiple Pain Sites: No  ADL: ADL ADL Comments: See Functional Assessment Tool  See Function Navigator  for Current Functional Status.   Therapy/Group: Individual Therapy   Second session: Time: 9767-3419 Time Calculation (min):  60 min  Pain Assessment: No/denies pain  Skilled Therapeutic Interventions: Therapeutic activity with focus on improved awareness and discharge planning, re-ed on BUE HEP, and visual attention/scanning while standing supported.    Pt received seated in recliner awaiting therapist.   OT presented written HEP previously provided and requested pt perform 4 of 4 exercises using written HEP.   Pt was only able to reproduce 1 exercise correctly independently and was challenged in problem-solving with management of  medium used (elastic band).  Pt declared that her husband would supervise her exercises and would hold her accountable for performing HEP as advised (2-3x/wk).   Pt was then escorted to [facility] clinic and completed 3 trials of dynavision target scanning while standing supported using RW.   Pt demo'd improved reaction time with each trial and achieved average reaction to stimulus of <2 seconds in any quadrant.     Pt enjoyed task and stated she felt more confident with her balance after task.   OT escorted pt back to her room and re-assessed use of AE.  Pt was trained with use of reacher and sock aid but elects not to use devices as advised due to improved left hip flexiblity allowing her to cross her legs when needed to don socks.   Pt is able to reach to her feet while seated and dons her shoes, as needed.   See FIM for current functional status  Therapy/Group: Individual Therapy  Jadon Ressler 11/24/2015, 8:30 AM

## 2015-11-24 NOTE — Progress Notes (Signed)
ANTICOAGULATION CONSULT NOTE - Follow Up Consult  Pharmacy Consult for coumadin Indication: atrial fibrillation  No Known Allergies  Patient Measurements: Height: 5\' 5"  (165.1 cm) Weight: 156 lb 4.8 oz (70.897 kg) IBW/kg (Calculated) : 57 Heparin Dosing Weight:   Vital Signs: Temp: 97.8 F (36.6 C) (05/19 0524) Temp Source: Oral (05/19 0524) BP: 106/48 mmHg (05/19 0524) Pulse Rate: 72 (05/19 0524)  Labs:  Recent Labs  11/22/15 0425 11/23/15 0622 11/24/15 0525  LABPROT 33.8* 30.4* 29.0*  INR 3.43* 2.98* 2.79*  CREATININE  --  0.91  --     Estimated Creatinine Clearance: 40.6 mL/min (by C-G formula based on Cr of 0.91).   Medications:  Scheduled:  . amiodarone  200 mg Oral Daily  . atorvastatin  10 mg Oral q1800  . diclofenac sodium  2 g Topical QID  . diltiazem  180 mg Oral Daily  . fluticasone  1 spray Each Nare Daily  . folic acid  1 mg Oral Daily  . furosemide  40 mg Oral Daily  . metoprolol tartrate  12.5 mg Oral BID  . protein supplement  1 scoop Oral TID WC  . sulfamethoxazole-trimethoprim  1 tablet Oral Q12H  . Vitamin D (Ergocalciferol)  50,000 Units Oral Weekly  . Warfarin - Pharmacist Dosing Inpatient   Does not apply q1800   Infusions:    Assessment: 82 yo female with afib is currently on therapeutic coumadin.  INR today is 2.79.  Patient is on bactrim and amiodarone.  Goal of Therapy:  INR 2-3 Monitor platelets by anticoagulation protocol: Yes   Plan:   - Coumadin 1 mg po x1   Daily PT/INR  Genoveva Singleton, Tsz-Yin 11/24/2015,8:18 AM

## 2015-11-24 NOTE — Progress Notes (Addendum)
[place] PHYSICAL MEDICINE & REHABILITATION     PROGRESS NOTE  Subjective/Complaints:  Looking forward to d/c on Sat  ROS:    + Small Tender (improving) papule right dorsal wrist, tremors (improving). Denies CP, SOB, N/V/D.  Objective: Vital Signs: Blood pressure 106/48, pulse 72, temperature 97.8 F (36.6 C), temperature source Oral, resp. rate 17, height 5\' 5"  (1.651 m), weight 70.897 kg (156 lb 4.8 oz), SpO2 94 %. No results found. No results for input(s): WBC, HGB, HCT, PLT in the last 72 hours.  Recent Labs  11/23/15 0622  NA 134*  K 3.9  CL 96*  GLUCOSE 96  BUN 15  CREATININE 0.91  CALCIUM 9.0   CBG (last 3)  No results for input(s): GLUCAP in the last 72 hours.  Wt Readings from Last 3 Encounters:  11/24/15 70.897 kg (156 lb 4.8 oz)  11/10/15 70.308 kg (155 lb)  08/21/15 70.67 kg (155 lb 12.8 oz)    Physical Exam:  BP 106/48 mmHg  Pulse 72  Temp(Src) 97.8 F (36.6 C) (Oral)  Resp 17  Ht 5\' 5"  (1.651 m)  Wt 70.897 kg (156 lb 4.8 oz)  BMI 26.01 kg/m2  SpO2 94% Constitutional: She appears well-developed and well-nourished. NAD. HENT: Normocephalic and atraumatic.  Eyes: Conjunctivae and EOM are normal.  Cardiovascular: Irregularly irregular  Respiratory: Effort normal and breath sounds normal. No respiratory distress.  GI: Soft. Bowel sounds are normal. She exhibits no distension.  Musculoskeletal: She exhibits edema and tenderness.  Neurological: She is alert and oriented.  Motor:  Bilateral upper extremities: 4+-5/5 proximal to distal Right lower extremity: 4+/5 proximally, 5/5 distally Left lower extremity hip flexion 3-/5, ankle dorsi/plantar flexion 5/5  Skin: Hip incision with dressing with dried drainage, otherwise c/d/i. Small tender papule right wrist (improving).  Psychiatric: She has a normal mood and affect. Her behavior is normal  Assessment/Plan: 1. Functional deficits secondary to left displaced femoral neck fracture status post  left hip bipolar hemiarthroplasty which require 3+ hours per day of interdisciplinary therapy in a comprehensive inpatient rehab setting. Physiatrist is providing close team supervision and 24 hour management of active medical problems listed below. Physiatrist and rehab team continue to assess barriers to discharge/monitor patient progress toward functional and medical goals. Plan D/C in am Function:  Bathing Bathing position   Position: Other (comment) (On BSC over toilet)  Bathing parts Body parts bathed by patient: Right arm, Left arm, Chest, Abdomen, Front perineal area, Buttocks, Right upper leg, Left upper leg, Right lower leg, Left lower leg Body parts bathed by helper: Front perineal area, Buttocks (per patient report done by nursing with catheter care)  Bathing assist Assist Level: More than reasonable time, Supervision or verbal cues      Upper Body Dressing/Undressing Upper body dressing   What is the patient wearing?: Bra, Pull over shirt/dress, Button up shirt Bra - Perfomed by patient: Thread/unthread right bra strap, Thread/unthread left bra strap, Hook/unhook bra (pull down sports bra)   Pull over shirt/dress - Perfomed by patient: Thread/unthread right sleeve, Thread/unthread left sleeve, Put head through opening, Pull shirt over trunk   Button up shirt - Perfomed by patient: Thread/unthread right sleeve, Thread/unthread left sleeve, Pull shirt around back      Upper body assist Assist Level: Set up   Set up : To obtain clothing/put away  Lower Body Dressing/Undressing Lower body dressing   What is the patient wearing?: Underwear, Pants, Shoes, Non-skid slipper socks Underwear - Performed by patient: Thread/unthread  right underwear leg, Pull underwear up/down, Thread/unthread left underwear leg Underwear - Performed by helper: Thread/unthread left underwear leg Pants- Performed by patient: Thread/unthread right pants leg, Pull pants up/down, Fasten/unfasten pants,  Thread/unthread left pants leg Pants- Performed by helper: Thread/unthread left pants leg Non-skid slipper socks- Performed by patient: Don/doff right sock, Don/doff left sock Non-skid slipper socks- Performed by helper: Don/doff left sock     Shoes - Performed by patient: Don/doff right shoe, Don/doff left shoe            Lower body assist Assist for lower body dressing: Set up, Touching or steadying assistance (Pt > 75%)   Set up : To obtain clothing/put away  Toileting Toileting Toileting activity did not occur: No continent bowel/bladder event Toileting steps completed by patient: Adjust clothing prior to toileting, Adjust clothing after toileting Toileting steps completed by helper: Performs perineal hygiene Toileting Assistive Devices: Grab bar or rail  Toileting assist Assist level: More than reasonable time   Transfers Chair/bed transfer   Chair/bed transfer method: Stand pivot Chair/bed transfer assist level: Supervision or verbal cues Chair/bed transfer assistive device: Armrests, Patent attorney     Max distance: 157ft Assist level: Supervision or verbal cues   Wheelchair   Type: Manual Max wheelchair distance: 183ft Assist Level: Supervision or verbal cues  Cognition Comprehension Comprehension assist level: Understands complex 90% of the time/cues 10% of the time  Expression Expression assist level: Expresses complex 90% of the time/cues < 10% of the time  Social Interaction Social Interaction assist level: Interacts appropriately 90% of the time - Needs monitoring or encouragement for participation or interaction.  Problem Solving Problem solving assist level: Solves complex 90% of the time/cues < 10% of the time  Memory Memory assist level: Recognizes or recalls 90% of the time/requires cueing < 10% of the time    Medical Problem List and Plan: 1. Debilitation with immobility secondary to left displaced femoral neck fracture status post  left hip bipolar hemiarthroplasty direct anterior approach 11/11/2014.   Weightbearing as tolerated  -continue therapies  Will see patient for transitional care management in 1-2 weeks after discharge. 2. DVT Prophylaxis/Anticoagulation: Chronic Coumadin. Monitor for rebleeding episodes  INR 2.79 3. Pain Management: Hydrocodone and Robaxin as needed. Monitor mental status 4. Acute blood loss anemia.   Hb 9.6 on 5/16 (stable)  Will cont to monitor 5. Neuropsych: This patient is capable of making decisions on his own behalf. 6. Skin/Wound Care: Routine skin checks  Per surgery, hip dressing not to be removed or changed until follow up with surgery (~5/20)  Will schedule for follow-up appointment next week. 7. Fluids/Electrolytes/Nutrition: Routine I&O   BMP within acceptable range on 5/18, will cont to monitor 8. Atrial fibrillation.   Amiodarone 4 mg twice a day  Cardizem CD 120 mg daily, increased to 180 by cardiology  If still in Afib in few weeks, plan for cardioversion  Follow-up cardiology services as needed 9 Diastolic congestive heart failure.   Lasix 20 mg daily, increased to 40 mg daily on 5/16.    Weigh patient daily (stable around 70 KGs).   Monitor for any signs of fluid overload 10. Hyperlipidemia. Lipitor 11. Constipation. Laxative assistance 12. Hypoalbuminemia  -protein supp 13. GU  Urinary retention/incontinence appears to have resolved, continue I/O caths when necessary  UA neg, urine culture with Citrobacter freundii on 5/12  Bactrim started 5/14-5/21- Monitor INR  DC'd Flomax  PVR's 0 14. Insomnia  Low dose  trazodone started PRN 5/12 15. Cellulitis right dorsal wrist (improving)  Small pinpoint papule, stable  Patient currently on Bactrim  Looks ok 16. Tremors (improving)   Likely secondary to medications  Will DC Claritin  Appreciate cardiology assistance. Amiodarone decreased, beta blocker re-initiated  LOS (Days) 8 A FACE TO FACE EVALUATION WAS  PERFORMED  Erick Colace 11/24/2015 6:29 AM

## 2015-11-25 DIAGNOSIS — S72001F Fracture of unspecified part of neck of right femur, subsequent encounter for open fracture type IIIA, IIIB, or IIIC with routine healing: Secondary | ICD-10-CM

## 2015-11-25 LAB — PROTIME-INR
INR: 2.57 — AB (ref 0.00–1.49)
Prothrombin Time: 27.2 seconds — ABNORMAL HIGH (ref 11.6–15.2)

## 2015-11-25 NOTE — Progress Notes (Signed)
Patient belongings packed. Med questions answered. Escorted to car by NT with son. No pain.

## 2015-11-25 NOTE — Discharge Summary (Signed)
Kendra Hughes, Kendra Hughes                  ACCOUNT NO.:  [id number]  MEDICAL RECORD NO.:  [id number]  LOCATION:  4M02C                        FACILITY:  [facility]  PHYSICIAN:  Maryla Morrow, MD        DATE OF BIRTH:  04-23-1923  DATE OF ADMISSION:  11/16/2015 DATE OF DISCHARGE:  11/25/2015                              DISCHARGE SUMMARY   DISCHARGE DIAGNOSES: 1. Left displaced femoral neck fracture status post left hip bipolar     hemiarthroplasty, direct anterior approach on Nov 11, 2014. 2. Chronic Coumadin for atrial fibrillation. 3. Acute blood loss anemia. 4. Diastolic congestive heart failure. 5. Hyperlipidemia. 6. Constipation. 7. Cellulitis 8. UTI 9. Tremors  HISTORY OF PRESENT ILLNESS:  This is a 82 year old right-handed female with history of hypertension, chronic atrial fibrillation on Coumadin therapy with history of DCCV, diastolic congestive heart failure, vitamin D deficiency.  Lives with spouse independent prior to admission. The patient still drives.  One level home, 2 steps to entry.  Presented on Nov 10, 2015, after a mechanical fall and presented to [facility].  X-rays and imaging revealed left hip displaced femoral neck fracture.  She was transferred to [facility], underwent left hip bipolar hemiarthroplasty through direct anterior approach on Nov 11, 2015, per Dr. Magnus Ivan.  Hospital course, pain management. Weightbearing as tolerated.  Chronic Coumadin resumed with intravenous heparin for bridging.  Acute blood loss anemia 9.0 and monitored.  On Nov 12, 2015, the patient with atrial fibrillation, RVR, heart rate 110 to 130, Cardiology consulted, placed on low-dose beta-blocker, Cardizem as well as intravenous amiodarone.  She was transitioned to by mouth Cardizem.  Mildly elevated troponin, felt to be nonspecific. Consideration made for cardioversion.  Physical and occupational therapy ongoing.  The patient was admitted for comprehensive rehab  program.  PAST MEDICAL HISTORY:  See discharge diagnoses.  SOCIAL HISTORY:  Lives with spouse, independent prior to admission. Functional status upon admission to rehab services was +2 physical assist, sit to stand; +2 physical assist, supine to sit; mod to max assist for activities of daily living.  PHYSICAL EXAMINATION:  VITAL SIGNS:  Blood pressure 129/95, pulse 96, temperature 98, respirations 18. GENERAL:  This was an alert female, no acute distress. LUNGS:  Clear to auscultation without wheeze. CARDIAC:  Irregularly Irregular. ABDOMEN:  Soft, nontender.  Good bowel sounds. EXTREMITIES:  Hip incision clean and dry, appropriately tender.  REHABILITATION AND HOSPITAL COURSE:  The patient was admitted to inpatient rehab services with therapies initiated on a 3-hour daily basis, consisting of physical therapy, occupational therapy, and rehabilitation nursing.  The following issues were addressed during the patient's rehabilitation course.  Pertaining to Kendra Hughes's left displaced femoral neck fracture, she had undergone left hip bipolar hemiarthroplasty, direct anterior approach on Nov 11, 2014, weightbearing as tolerated.  She would follow up with Dr. Magnus Ivan.  Chronic Coumadin ongoing, latest INR therapeutic, no bleeding episodes.  She was following up with Cardiology Services.  Cardiac rate remained controlled.  She did have some mildly elevated heart rate.  She continued on Cardizem and amiodarone.  Her amiodarone was adjusted for some increasing tremors, felt to be possibly related to amiodarone,  cardiac rate remained controlled.  She would follow up with Cardiology Services.  Consider elective outpatient cardioversion.  Blood pressures remained well controlled.  She exhibited no signs of fluid overload. She remained on Lasix 40 mg daily.  Urine culture did show Citrobacter, she remained on Bactrim from Nov 19, 2015, through Nov 26, 2015.  The patient received weekly  collaborative interdisciplinary team conferences to discuss estimated length of stay, family teaching, and any barriers to discharge.  The patient performed a gait training in the [place] 75 feet x2, with seated rest breaks.  She could ambulate up to 150 feet with supervision in a controlled environment, gait training on uneven cement and pave sidewalks with close supervision.  She did well in community environment.  Sit to stand transfers and wheelchair at close supervision.  Navigating stairs, minimal assist to supervision.  She could gather her belongings for activities of daily living and homemaking, needing some assistance for lower body dressing.  Full family teaching was completed and plan discharge to home with ongoing therapies dictated per [employer].  DISCHARGE MEDICATIONS: 1. Amiodarone 200 mg p.o. daily. 2. Lipitor 10 mg p.o. daily. 3. Voltaren Gel 4 times daily to affected area. 4. Cardizem CD 180 mg p.o. daily. 5. Flonase 1 spray each nostril daily. 6. Folic acid 1 mg p.o. daily. 7. Lasix 40 mg p.o. daily. 8. Robaxin 500 mg p.o. every 6 hours as needed muscle spasms. 9. Lopressor 12.5 mg p.o. b.i.d. 10.Oxycodone 1 tablet every 4 hours as needed moderate pain, dispense     of 90 tablets. 11.Bactrim 1 tablet every 12 hours through Nov 26, 2015, and stop. 12.Vitamin E 50,000 units weekly. 13.Coumadin latest dose of 1 mg, adjusted accordingly for INR of 2.0-     3.0.  DIET:  Her diet was regular.  FOLLOWUP:  She would follow up with Dr. Maryla Morrow at the outpatient rehab service office as directed; Dr. Doneen Poisson, Orthopedic Services, 2 weeks; Dr. Zoila Shutter, Cardiology Service, 2 weeks; Dr. Lois Huxley on Dec 01, 2015.  A home health nurse had been arranged for Coumadin therapy due to be drawn weekly with results to [facility], [phone] and fax [phone].     Mariam Dollar, P.A.   ______________________________ Maryla Morrow,  MD    DA/MEDQ  D:  11/24/2015  T:  11/25/2015  Job:  [id number]  cc:   Zoila Shutter, MD Carmin Richmond, MD Vanita Panda. Magnus Ivan, M.D.

## 2015-11-25 NOTE — Progress Notes (Signed)
Patient ID: Kendra Hughes, female   DOB: 1922-11-29, 82 y.o.   MRN: [id number]  11/25/15.  [place] PHYSICAL MEDICINE & REHABILITATION     PROGRESS NOTE Subjective/Complaints:  Stable and ready for d/c.  ROS:    + Small Tender (improving) papule right dorsal wrist, tremors (improving). Denies CP, SOB, N/V/D.  Objective: Vital Signs: Blood pressure 122/46, pulse 85, temperature 98.2 F (36.8 C), temperature source Oral, resp. rate 16, height 5\' 5"  (1.651 m), weight 154 lb 8.7 oz (70.1 kg), SpO2 97 %. No results found. No results for input(s): WBC, HGB, HCT, PLT in the last 72 hours.  Recent Labs  11/23/15 0622  NA 134*  K 3.9  CL 96*  GLUCOSE 96  BUN 15  CREATININE 0.91  CALCIUM 9.0   CBG (last 3)  No results for input(s): GLUCAP in the last 72 hours.  Wt Readings from Last 3 Encounters:  11/25/15 154 lb 8.7 oz (70.1 kg)  11/10/15 155 lb (70.308 kg)  08/21/15 155 lb 12.8 oz (70.67 kg)    Physical Exam:  BP 122/46 mmHg  Pulse 85  Temp(Src) 98.2 F (36.8 C) (Oral)  Resp 16  Ht 5\' 5"  (1.651 m)  Wt 154 lb 8.7 oz (70.1 kg)  BMI 25.72 kg/m2  SpO2 97% Constitutional: She appears well-developed and well-nourished. NAD. HENT: Normocephalic and atraumatic.  Eyes: Conjunctivae and EOM are normal.  Cardiovascular: Irregularly irregular  Respiratory: Effort normal and breath sounds normal. No respiratory distress.  GI: Soft. Bowel sounds are normal. She exhibits no distension.  Musculoskeletal: She exhibits edema and tenderness.  Neurological: She is alert and oriented.  Motor:  Bilateral upper extremities: 4+-5/5 proximal to distal Right lower extremity: 4+/5 proximally, 5/5 distally   Medical Problem List and Plan: 1. Debilitation with immobility secondary to left displaced femoral neck fracture status post left hip bipolar hemiarthroplasty direct anterior approach 11/11/2014.   Weightbearing as tolerated  -continue therapies  Will see patient for transitional  care management in 1-2 weeks after discharge. 2. DVT Prophylaxis/Anticoagulation: Chronic Coumadin. Monitor for rebleeding episodes  INR 2.79 3. Atrial fibrillation.   Amiodarone 4 mg twice a day  Cardizem CD 120 mg daily, increased to 180 by cardiology  If still in Afib in few weeks, plan for cardioversion  Follow-up cardiology services as needed  Looks ok  Stable for discharge today  LOS (Days) 9 A FACE TO FACE EVALUATION WAS PERFORMED  11/25/2015 8:37 AM

## 2015-11-28 ENCOUNTER — Encounter: Payer: Self-pay | Admitting: Pharmacist Clinician (PhC)/ Clinical Pharmacy Specialist

## 2015-11-28 ENCOUNTER — Telehealth: Payer: Self-pay | Admitting: Internal Medicine

## 2015-11-28 ENCOUNTER — Telehealth: Payer: Self-pay

## 2015-11-28 NOTE — Telephone Encounter (Signed)
1. Are you/is patient experiencing any problems since coming home? Are there any questions regarding any aspect of care? 2. Are there any questions regarding medications administration/dosing? Are meds being taken as prescribed? Patient should review meds with caller to confirm 3. Have there been any falls? 4. Has Home Health been to the house and/or have they contacted you? If not, have you tried to contact them? Can we help you contact them? 5. Are bowels and bladder emptying properly? Are there any unexpected incontinence issues? If applicable, is patient following bowel/bladder programs? 6. Any fevers, problems with breathing, unexpected pain? 7. Are there any skin problems or new areas of breakdown? 8. Has the patient/family member arranged specialty MD follow up (ie cardiology/neurology/renal/surgical/etc)?  Can we help arrange? 9. Does the patient need any other services or support that we can help arrange? 10. Are caregivers following through as expected in assisting the patient? 11. Has the patient quit smoking, drinking alcohol, or using drugs as recommended?  

## 2015-11-28 NOTE — Telephone Encounter (Signed)
Ander Slade [facility] calling w INR report for today - 5/23 - INR of 1.7  Aware I will route to Coumadin clinic for dosing & recheck recommendations.  Misty Stanley requests a call back to confirmed number listed for instructions.

## 2015-11-28 NOTE — Progress Notes (Signed)
Called in error - followed by PCP  This encounter was created in error - please disregard.

## 2015-11-28 NOTE — Progress Notes (Signed)
[facility] called unit with pharmacist on line because they wanted directions on medication Amiodarone. Gave her Abral.

## 2015-12-01 NOTE — Telephone Encounter (Signed)
Spoke with patient and patient's husband. Patient reports that it is such a hassle to get to [facility] and she does not feel like she needs the appointment since she is doing so much better. Advised patient to call our office with any questions or concerns. Patient advised that appointment will be cancelled.

## 2015-12-06 ENCOUNTER — Encounter: Payer: Self-pay | Admitting: Internal Medicine

## 2015-12-06 ENCOUNTER — Ambulatory Visit (INDEPENDENT_AMBULATORY_CARE_PROVIDER_SITE_OTHER): Payer: Medicare Other | Admitting: Internal Medicine

## 2015-12-06 VITALS — BP 120/70 | HR 98 | Ht 65.0 in | Wt 156.0 lb

## 2015-12-06 DIAGNOSIS — S72002S Fracture of unspecified part of neck of left femur, sequela: Secondary | ICD-10-CM | POA: Diagnosis not present

## 2015-12-06 DIAGNOSIS — I519 Heart disease, unspecified: Secondary | ICD-10-CM

## 2015-12-06 DIAGNOSIS — I10 Essential (primary) hypertension: Secondary | ICD-10-CM

## 2015-12-06 DIAGNOSIS — I5189 Other ill-defined heart diseases: Secondary | ICD-10-CM

## 2015-12-06 DIAGNOSIS — I481 Persistent atrial fibrillation: Secondary | ICD-10-CM

## 2015-12-06 DIAGNOSIS — I4819 Other persistent atrial fibrillation: Secondary | ICD-10-CM

## 2015-12-06 MED ORDER — METOPROLOL TARTRATE 25 MG PO TABS: 25.0000 mg | ORAL_TABLET | Freq: Two times a day (BID) | ORAL | Status: DC

## 2015-12-06 NOTE — Patient Instructions (Addendum)
Your physician has recommended you make the following change in your medication...  1. STOP amiodarone 2. INCREASE metoprolol tartrate to 25mg  twice daily   Your physician recommends that you schedule a follow-up appointment in: TWO MONTHS with Dr. 

## 2015-12-07 ENCOUNTER — Encounter: Payer: Medicare Other | Admitting: Physical Medicine & Rehabilitation

## 2015-12-07 DIAGNOSIS — I5189 Other ill-defined heart diseases: Secondary | ICD-10-CM | POA: Insufficient documentation

## 2015-12-07 DIAGNOSIS — I10 Essential (primary) hypertension: Secondary | ICD-10-CM | POA: Insufficient documentation

## 2015-12-07 DIAGNOSIS — S7290XA Unspecified fracture of unspecified femur, initial encounter for closed fracture: Secondary | ICD-10-CM | POA: Insufficient documentation

## 2015-12-07 NOTE — Progress Notes (Signed)
OFFICE NOTE  Chief Complaint:  Hospital follow-up  Primary Care Physician: Carmin Richmond, MD  HPI:  MCKENZEE BEEM is an 82 year old female who I've been following for some time with a history of paroxysmal atrial fibrillation. Despite her CHADS2 score of 2, she has refused warfarin anticoagulation in the past, opting for aspirin which are documented extensively he is insufficient to protect her from stroke. She had worsening shortness of breath and apparently she and her husband tried to call the office to schedule an appointment with me but could not get an appointment.  When I saw her in the office a few months ago, she was notably breathless and can hardly finish sentences. An EKG demonstrated atrial fibrillation with rapid ventricular response at a rate of 173. She did appear somewhat dizzy and her blood pressure was low around 100 systolic.  I admitted her to the hospital and she was an inpatient for 1 week.  She was diuresed and had better rate control of her A. fib. She did undergo a TEE, which demonstrated a left atrial appendage thrombus. Based on that she could not be cardioverted and is recommended that she remain on warfarin with therapeutic INRs for at least one month.  Discharge weight was 168 and she is 166 pounds today.  She does report some mild edema which is dependent in the right leg but none in the left but does improve with elevation at night.  After her last office visit, recommended repeat TEE which demonstrated resolution of her left atrial appendage thrombus. She then underwent elective cardioversion which was successful in converting her back to sinus rhythm. After she returned today I asked her how she was feeling and she reports that she feels "great". I asked her she was feeling any palpitations or irregularity to her heart beat and she denied it. I then presented her with her EKG which demonstrates that she has gone back into atrial fibrillation, suggesting that she  is not symptomatic from A. fib and generally unaware of it. She does report that she takes her blood pressure and pulse every day and noted that after about 5 days of her heart rate being in the mid 50s it increased up into the 70s and 80s, signifying that is probably when she went back into A. Fib.  She's had marked improvement in her swelling and is now down 10 pounds. She's been wearing her lower extremity compression stockings with marked improvement. She had recent laboratory work from her primary care provider which showed an elevated triglyceride level of 253, total cholesterol 154, HDL 36 and LDL 67. It should be noted that these were nonfasting labs. There was a increase in her Lipitor to 20 mg daily.  I saw Adell back in the office today. She reports a big improvement in her shortness of breath however she still has persistent shortness of breath with exertion. Her weight is down now over 15 pounds with diuresis and she remains euvolemic. There is no evidence of any peripheral edema. She does report of breathing is better than it had been in the past but she still says she gets short of breath with walking long distances.   Eletha seems to be doing very well. In the office today she reports stable shortness of breath. She is asking to come off some medicines if possible. She remains in A. fib with a slow ventricular response. Her INR has been therapeutic and followed by her primary. She recently had  lab work including a lipid profile. Her triglycerides were elevated in the 300s however this was nonfasting.  Mrs. Crossman returns today for follow-up. She denies any worsening chest pain or worsening shortness of breath. Her weight is been fairly stable. Her swelling is stable. She is therapeutic with her INR. Blood pressure is at goal. She has persistent A. fib and his rate controlled at 97. She recently had lab work which shows excellent cholesterol control with total cholesterol 124, triglycerides 84,  HDL 50 and LDL 57. Renal function is normal with creatinine of 0.8. CBC is also normal and vitamin D was normal at 49 on replacement therapy. ALT is mildly elevated at 50.  12/06/2015  Catriona Dillenbeck was seen back today in the office in hospital follow-up. Unfortunately she suffered a hip fracture and went into A. fib with RVR. She was placed on amiodarone which helped to slow her rate and she was considered for TEE cardioversion however due to the paroxysmal nature of this she was just treated with amiodarone. Unfortunately amiodarone caused her significant shaking and this was dose reduced with an improvement in her symptoms although she still has some shakiness. She is now however to feed herself which was difficult before due to her shaking. She remains in atrial fibrillation and 95 but is asymptomatic with this. In the past she's been cardioverted but obviously failed to hold sinus rhythm. She is now re-anticoagulated on warfarin. There was a discussion about outpatient cardioversion. A repeat echo was performed and x-ray showed preserved systolic function.  PMHx:  Past Medical History  Diagnosis Date  . PAF (paroxysmal atrial fibrillation) (HCC)   . Hypertension   . Dyslipidemia   . DOE (dyspnea on exertion)   . History of nuclear stress test 10/2009    normal exam  . Chronic diastolic (congestive) heart failure (HCC)   . DVT (deep venous thrombosis) (HCC) "years ago"  . Fracture of left hip (HCC) 11/09/2015    "fell"  . Arthritis     "right knee" (11/10/2015)  . Rheumatoid arthritis (HCC) 1970    "it's in remission" (11/10/2015)    Past Surgical History  Procedure Laterality Date  . Abdominal hysterectomy  1996  . Breast biopsy Right 1984    "benign"  . Transthoracic echocardiogram  07/22/2012    EF 55-60%; mild AV regurg; mild MR; mild-mod TR  . Tee without cardioversion N/A 11/01/2013    Procedure: TRANSESOPHAGEAL ECHOCARDIOGRAM (TEE);  Surgeon: Chrystie Nose, MD;  Location: [facility]  ENDOSCOPY;  Service: Cardiovascular;  Laterality: N/A;  . Cardioversion N/A 11/01/2013    Procedure: CARDIOVERSION;  Surgeon: Chrystie Nose, MD;  Location: [facility] ENDOSCOPY;  Service: Cardiovascular;  Laterality: N/A;  . Tonsillectomy    . Cataract extraction w/ intraocular lens  implant, bilateral Bilateral 2003  . Bladder suspension  X 2  . Rectocele repair    . Total hip arthroplasty Left 11/11/2015    Procedure:  HEMI ARTHROPLASTY ANTERIOR APPROACH LEFT;  Surgeon: Kathryne Hitch, MD;  Location: [facility] OR;  Service: Orthopedics;  Laterality: Left;    FAMHx:  Family History  Problem Relation Age of Onset  . Heart disease Mother     SOCHx:   reports that she has never smoked. She has never used smokeless tobacco. She reports that she does not drink alcohol or use illicit drugs.  ALLERGIES:  No Known Allergies  ROS: A comprehensive review of systems was negative except for: Respiratory: positive for dyspnea on exertion  HOME  MEDS: Current Outpatient Prescriptions  Medication Sig Dispense Refill  . atorvastatin (LIPITOR) 10 MG tablet Take 1 tablet (10 mg total) by mouth daily at 6 PM. 30 tablet 1  . cholecalciferol (VITAMIN D) 1000 units tablet Take 1,000 Units by mouth daily.    . diclofenac sodium (VOLTAREN) 1 % GEL Apply 2 g topically 4 (four) times daily. 1 Tube 0  . diltiazem (CARDIZEM CD) 180 MG 24 hr capsule Take 1 capsule (180 mg total) by mouth daily. 30 capsule 1  . Fexofenadine HCl (ALLEGRA PO) Take 1 tablet by mouth as needed (allergies).     . Fluticasone Propionate (FLONASE NA) Place 1 spray into the nose as needed (allergies).     . folic acid (FOLVITE) 800 MCG tablet Take 1 tablet (800 mcg total) by mouth daily. 30 tablet 0  . furosemide (LASIX) 40 MG tablet Take 1 tablet (40 mg total) by mouth daily. 30 tablet 1  . metoprolol tartrate (LOPRESSOR) 25 MG tablet Take 1 tablet (25 mg total) by mouth 2 (two) times daily. 180 tablet 1  . Vitamin D, Ergocalciferol,  (DRISDOL) 50000 units CAPS capsule Take 1 capsule (50,000 Units total) by mouth once a week. 30 capsule 0  . warfarin (COUMADIN) 1 MG tablet Take 1 tablet (1 mg total) by mouth daily. 30 tablet 1   No current facility-administered medications for this visit.    LABS/IMAGING: No results found for this or any previous visit (from the past 48 hour(s)). No results found.  VITALS: BP 120/70 mmHg  Pulse 98  Ht 5\' 5"  (1.651 m)  Wt 156 lb (70.761 kg)  BMI 25.96 kg/m2  EXAM: General appearance: alert and no distress Neck: no carotid bruit and no JVD Lungs: clear to auscultation bilaterally Heart: irregularly irregular rhythm Abdomen: soft, non-tender; bowel sounds normal; no masses,  no organomegaly Extremities: extremities normal, atraumatic, no cyanosis or edema Pulses: 2+ and symmetric . Skin: Skin color, texture, turgor normal. No rashes or lesions Neurologic: Grossly normal Psych: Normal  EKG: Atrial fibrillation with controlled ventricular response at 95  ASSESSMENT: 1. Chronic diastolic heart failure 2. Persistent atrial fibrillation - with slow ventricular response 3. 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 EF 60-65% 5. CHADSVASC 5-on chronic warfarin 6. Recent hip fracture  PLAN: 1.   Mrs. Lampe is recovering nicely from a recent hip fracture in fact she says she feels pretty well. She's got a pretty strong's. And is quite active. Although she is 82 years old, she seems to be doing well. She is in persistent A. fib but not clearly aware of it. She has had side effects from amiodarone and I would like to discontinue this medication. We talked about rate control versus rhythm strategy and she would not prefer to have any further procedures such as cardioversion. We will plan rate control strategy and I would increase her Lopressor to 25 mg twice a day for additional rate control. We'll continue on chronic warfarin and plan to see her back in 3-6 months.  95, MD, [facility] Attending Cardiologist [facility]  [facility] 12/07/2015, 6:43 PM

## 2015-12-11 ENCOUNTER — Telehealth: Payer: Self-pay | Admitting: *Deleted

## 2015-12-11 MED ORDER — METOPROLOL TARTRATE 25 MG PO TABS: 25.0000 mg | ORAL_TABLET | Freq: Two times a day (BID) | ORAL | Status: DC

## 2015-12-11 MED ORDER — DILTIAZEM HCL ER COATED BEADS 180 MG PO CP24: 180.0000 mg | ORAL_CAPSULE | Freq: Every day | ORAL | Status: DC

## 2015-12-11 NOTE — Telephone Encounter (Signed)
Kendra Hughes left a message on the refill vm requesting refills on metoprolol 25mg  and diazepam 120mg (I think she meant diltiazem) be sent to [facility], phone number provided was [phone]. Her call back number is [phone]. Thanks, MI

## 2015-12-11 NOTE — Telephone Encounter (Signed)
rx sent

## 2015-12-13 ENCOUNTER — Other Ambulatory Visit: Payer: Self-pay | Admitting: *Deleted

## 2015-12-13 MED ORDER — DILTIAZEM HCL ER COATED BEADS 180 MG PO CP24: 180.0000 mg | ORAL_CAPSULE | Freq: Every day | ORAL | Status: DC

## 2015-12-13 MED ORDER — METOPROLOL TARTRATE 25 MG PO TABS: 25.0000 mg | ORAL_TABLET | Freq: Two times a day (BID) | ORAL | Status: DC

## 2016-02-05 ENCOUNTER — Encounter: Payer: Self-pay | Admitting: Internal Medicine

## 2016-02-05 ENCOUNTER — Ambulatory Visit (INDEPENDENT_AMBULATORY_CARE_PROVIDER_SITE_OTHER): Payer: Medicare Other | Admitting: Internal Medicine

## 2016-02-05 VITALS — BP 110/68 | HR 89 | Ht 65.0 in | Wt 156.8 lb

## 2016-02-05 DIAGNOSIS — E785 Hyperlipidemia, unspecified: Secondary | ICD-10-CM | POA: Diagnosis not present

## 2016-02-05 DIAGNOSIS — I519 Heart disease, unspecified: Secondary | ICD-10-CM | POA: Diagnosis not present

## 2016-02-05 DIAGNOSIS — I481 Persistent atrial fibrillation: Secondary | ICD-10-CM

## 2016-02-05 DIAGNOSIS — I10 Essential (primary) hypertension: Secondary | ICD-10-CM

## 2016-02-05 DIAGNOSIS — I4819 Other persistent atrial fibrillation: Secondary | ICD-10-CM

## 2016-02-05 DIAGNOSIS — I5189 Other ill-defined heart diseases: Secondary | ICD-10-CM

## 2016-02-05 MED ORDER — DILTIAZEM HCL ER COATED BEADS 120 MG PO CP24: 120.0000 mg | ORAL_CAPSULE | Freq: Every day | ORAL | 1 refills | Status: DC

## 2016-02-05 MED ORDER — FUROSEMIDE 20 MG PO TABS: 20.0000 mg | ORAL_TABLET | Freq: Every day | ORAL | Status: DC

## 2016-02-05 NOTE — Progress Notes (Signed)
OFFICE NOTE  Chief Complaint:  Hospital follow-up  Primary Care Physician: Carmin Richmond, MD  HPI:  Kendra Hughes is an 82 year old female who I've been following for some time with a history of paroxysmal atrial fibrillation. Despite her CHADS2 score of 2, she has refused warfarin anticoagulation in the past, opting for aspirin which are documented extensively he is insufficient to protect her from stroke. She had worsening shortness of breath and apparently she and her husband tried to call the office to schedule an appointment with me but could not get an appointment.  When I saw her in the office a few months ago, she was notably breathless and can hardly finish sentences. An EKG demonstrated atrial fibrillation with rapid ventricular response at a rate of 173. She did appear somewhat dizzy and her blood pressure was low around 100 systolic.  I admitted her to the hospital and she was an inpatient for 1 week.  She was diuresed and had better rate control of her A. fib. She did undergo a TEE, which demonstrated a left atrial appendage thrombus. Based on that she could not be cardioverted and is recommended that she remain on warfarin with therapeutic INRs for at least one month.  Discharge weight was 168 and she is 166 pounds today.  She does report some mild edema which is dependent in the right leg but none in the left but does improve with elevation at night.  After her last office visit, recommended repeat TEE which demonstrated resolution of her left atrial appendage thrombus. She then underwent elective cardioversion which was successful in converting her back to sinus rhythm. After she returned today I asked her how she was feeling and she reports that she feels "great". I asked her she was feeling any palpitations or irregularity to her heart beat and she denied it. I then presented her with her EKG which demonstrates that she has gone back into atrial fibrillation, suggesting that she  is not symptomatic from A. fib and generally unaware of it. She does report that she takes her blood pressure and pulse every day and noted that after about 5 days of her heart rate being in the mid 50s it increased up into the 70s and 80s, signifying that is probably when she went back into A. Fib.  She's had marked improvement in her swelling and is now down 10 pounds. She's been wearing her lower extremity compression stockings with marked improvement. She had recent laboratory work from her primary care provider which showed an elevated triglyceride level of 253, total cholesterol 154, HDL 36 and LDL 67. It should be noted that these were nonfasting labs. There was a increase in her Lipitor to 20 mg daily.  I saw Kendra Hughes back in the office today. She reports a big improvement in her shortness of breath however she still has persistent shortness of breath with exertion. Her weight is down now over 15 pounds with diuresis and she remains euvolemic. There is no evidence of any peripheral edema. She does report of breathing is better than it had been in the past but she still says she gets short of breath with walking long distances.   Kendra Hughes seems to be doing very well. In the office today she reports stable shortness of breath. She is asking to come off some medicines if possible. She remains in A. fib with a slow ventricular response. Her INR has been therapeutic and followed by her primary. She recently had  lab work including a lipid profile. Her triglycerides were elevated in the 300s however this was nonfasting.  Kendra Hughes returns today for follow-up. She denies any worsening chest pain or worsening shortness of breath. Her weight is been fairly stable. Her swelling is stable. She is therapeutic with her INR. Blood pressure is at goal. She has persistent A. fib and his rate controlled at 97. She recently had lab work which shows excellent cholesterol control with total cholesterol 124, triglycerides 84,  HDL 50 and LDL 57. Renal function is normal with creatinine of 0.8. CBC is also normal and vitamin D was normal at 49 on replacement therapy. ALT is mildly elevated at 50.  12/06/2015  Kendra Hughes was seen back today in the office in hospital follow-up. Unfortunately she suffered a hip fracture and went into A. fib with RVR. She was placed on amiodarone which helped to slow her rate and she was considered for TEE cardioversion however due to the paroxysmal nature of this she was just treated with amiodarone. Unfortunately amiodarone caused her significant shaking and this was dose reduced with an improvement in her symptoms although she still has some shakiness. She is now however to feed herself which was difficult before due to her shaking. She remains in atrial fibrillation and 95 but is asymptomatic with this. In the past she's been cardioverted but obviously failed to hold sinus rhythm. She is now re-anticoagulated on warfarin. There was a discussion about outpatient cardioversion. A repeat echo was performed and x-ray showed preserved systolic function.  02/05/2016  I had the pleasure seeing Kendra Hughes back today for follow-up. At her last office visit I discontinued her amiodarone completely as she was having tremors and muscle soreness. The symptoms have improved significantly but not totally gone away. I did increase her Lopressor for additional rate control her heart rate seems to be fairly well controlled. She denies any worsening shortness of breath or lower extremity swelling. She reports having a therapeutic INR which is followed by her primary care provider.  PMHx:  Past Medical History:  Diagnosis Date  . Arthritis    "right knee" (11/10/2015)  . Chronic diastolic (congestive) heart failure (HCC)   . DOE (dyspnea on exertion)   . DVT (deep venous thrombosis) (HCC) "years ago"  . Dyslipidemia   . Fracture of left hip (HCC) 11/09/2015   "fell"  . History of nuclear stress test 10/2009   normal  exam  . Hypertension   . PAF (paroxysmal atrial fibrillation) (HCC)   . Rheumatoid arthritis (HCC) 1970   "it's in remission" (11/10/2015)    Past Surgical History:  Procedure Laterality Date  . ABDOMINAL HYSTERECTOMY  1996  . BLADDER SUSPENSION  X 2  . BREAST BIOPSY Right 1984   "benign"  . CARDIOVERSION N/A 11/01/2013   Procedure: CARDIOVERSION;  Surgeon: Chrystie Nose, MD;  Location: [facility] ENDOSCOPY;  Service: Cardiovascular;  Laterality: N/A;  . CATARACT EXTRACTION W/ INTRAOCULAR LENS  IMPLANT, BILATERAL Bilateral 2003  . RECTOCELE REPAIR    . TEE WITHOUT CARDIOVERSION N/A 11/01/2013   Procedure: TRANSESOPHAGEAL ECHOCARDIOGRAM (TEE);  Surgeon: Chrystie Nose, MD;  Location: [facility] ENDOSCOPY;  Service: Cardiovascular;  Laterality: N/A;  . TONSILLECTOMY    . TOTAL HIP ARTHROPLASTY Left 11/11/2015   Procedure:  HEMI ARTHROPLASTY ANTERIOR APPROACH LEFT;  Surgeon: Kathryne Hitch, MD;  Location: [facility] OR;  Service: Orthopedics;  Laterality: Left;  . TRANSTHORACIC ECHOCARDIOGRAM  07/22/2012   EF 55-60%; mild AV regurg; mild MR; mild-mod  TR    FAMHx:  Family History  Problem Relation Age of Onset  . Heart disease Mother     SOCHx:   reports that she has never smoked. She has never used smokeless tobacco. She reports that she does not drink alcohol or use drugs.  ALLERGIES:  No Known Allergies  ROS: Pertinent items noted in HPI and remainder of comprehensive ROS otherwise negative.  HOME MEDS: Current Outpatient Prescriptions  Medication Sig Dispense Refill  . atorvastatin (LIPITOR) 10 MG tablet Take 1 tablet (10 mg total) by mouth daily at 6 PM. (Patient taking differently: Take 20 mg by mouth daily at 6 PM. ) 30 tablet 1  . cholecalciferol (VITAMIN D) 1000 units tablet Take 1,000 Units by mouth daily.    . diclofenac sodium (VOLTAREN) 1 % GEL Apply 2 g topically 4 (four) times daily. 1 Tube 0  . Fexofenadine HCl (ALLEGRA PO) Take 1 tablet by mouth as needed (allergies).     .  Fluticasone Propionate (FLONASE NA) Place 1 spray into the nose as needed (allergies).     . folic acid (FOLVITE) 800 MCG tablet Take 1 tablet (800 mcg total) by mouth daily. 30 tablet 0  . furosemide (LASIX) 20 MG tablet Take 1 tablet (20 mg total) by mouth daily.    . metoprolol tartrate (LOPRESSOR) 25 MG tablet Take 1 tablet (25 mg total) by mouth 2 (two) times daily. 180 tablet 1  . Vitamin D, Ergocalciferol, (DRISDOL) 50000 units CAPS capsule Take 1 capsule (50,000 Units total) by mouth once a week. 30 capsule 0  . warfarin (COUMADIN) 1 MG tablet Take 1 tablet (1 mg total) by mouth daily. (Patient taking differently: Take 3 mg by mouth daily. ) 30 tablet 1  . diltiazem (CARDIZEM CD) 120 MG 24 hr capsule Take 1 capsule (120 mg total) by mouth daily. 90 capsule 1   No current facility-administered medications for this visit.     LABS/IMAGING: No results found for this or any previous visit (from the past 48 hour(s)). No results found.  VITALS: BP 110/68 (BP Location: Right Arm, Patient Position: Sitting, Cuff Size: Normal)   Pulse 89   Ht 5\' 5"  (1.651 m)   Wt 156 lb 12.8 oz (71.1 kg)   SpO2 97%   BMI 26.09 kg/m   EXAM: General appearance: alert and no distress Neck: no carotid bruit and no JVD Lungs: clear to auscultation bilaterally Heart: irregularly irregular rhythm Abdomen: soft, non-tender; bowel sounds normal; no masses,  no organomegaly Extremities: extremities normal, atraumatic, no cyanosis or edema Pulses: 2+ and symmetric . Skin: Skin color, texture, turgor normal. No rashes or lesions Neurologic: Grossly normal Psych: Normal  EKG: Atrial fibrillation with controlled ventricular response at 89  ASSESSMENT: 1. Chronic diastolic heart failure 2. Persistent atrial fibrillation - with slow ventricular response 3. 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 EF 60-65% 5. CHADSVASC 5-on chronic warfarin 6. Recent hip fracture  PLAN: 1.   Kendra Hughes remains  in rate-controlled A. fib with recent changes to her medications. She was taken off of amiodarone and this is listed as an intolerance due to tremors and muscle weakness. She's been therapeutically anticoagulated on warfarin. She seems to be ambulating better with no pain after her hip fracture. She seems to be euvolemic currently although reports that she has had some low blood pressures particular a blood pressures in the 80s or 90s which may been associated with instability causing her fall. I like to  decrease her diltiazem to 120 mg CD daily and decrease her Lasix to 20 mg from 40 mg to see if we can allow for a little higher blood pressure and still maintain rate control.  Follow-up with me in 3 months.  Chrystie Nose, MD, [facility] Attending Cardiologist [facility]  Chrystie Nose 02/05/2016, 12:49 PM

## 2016-02-05 NOTE — Patient Instructions (Signed)
Medication Instructions:  1. DECREASE Ditilizem to 120mg . Take 1 tab by mouth daily. 2. DECREASE Lasix to 20 mg. Take 1 tab by mouth daily.   Labwork: None Ordered  Testing/Procedures: None Ordered  Follow-Up: Your physician recommends that you schedule a follow-up appointment in: 3 MONTHS WITH DR HILTY.   Any Other Special Instructions Will Be Listed Below (If Applicable).     If you need a refill on your cardiac medications before your next appointment, please call your pharmacy.

## 2016-04-24 ENCOUNTER — Encounter: Payer: Self-pay | Admitting: Internal Medicine

## 2016-04-24 ENCOUNTER — Ambulatory Visit (INDEPENDENT_AMBULATORY_CARE_PROVIDER_SITE_OTHER): Payer: Medicare Other | Admitting: Internal Medicine

## 2016-04-24 VITALS — BP 135/85 | HR 83 | Ht 65.0 in | Wt 162.0 lb

## 2016-04-24 DIAGNOSIS — I481 Persistent atrial fibrillation: Secondary | ICD-10-CM | POA: Diagnosis not present

## 2016-04-24 DIAGNOSIS — I4819 Other persistent atrial fibrillation: Secondary | ICD-10-CM

## 2016-04-24 DIAGNOSIS — I10 Essential (primary) hypertension: Secondary | ICD-10-CM

## 2016-04-24 DIAGNOSIS — I519 Heart disease, unspecified: Secondary | ICD-10-CM | POA: Diagnosis not present

## 2016-04-24 DIAGNOSIS — I5189 Other ill-defined heart diseases: Secondary | ICD-10-CM

## 2016-04-24 MED ORDER — FUROSEMIDE 40 MG PO TABS: 40.0000 mg | ORAL_TABLET | Freq: Every day | ORAL | 6 refills | Status: DC

## 2016-04-24 MED ORDER — DILTIAZEM HCL ER COATED BEADS 120 MG PO CP24: 120.0000 mg | ORAL_CAPSULE | Freq: Every day | ORAL | 1 refills | Status: DC

## 2016-04-24 NOTE — Progress Notes (Signed)
OFFICE NOTE  Chief Complaint:  Routine follow-up  Primary Care Physician: Carmin Richmond, MD  HPI:  Kendra Hughes is an 82 year old female who I've been following for some time with a history of paroxysmal atrial fibrillation. Despite her CHADS2 score of 2, she has refused warfarin anticoagulation in the past, opting for aspirin which are documented extensively he is insufficient to protect her from stroke. She had worsening shortness of breath and apparently she and her husband tried to call the office to schedule an appointment with me but could not get an appointment.  When I saw her in the office a few months ago, she was notably breathless and can hardly finish sentences. An EKG demonstrated atrial fibrillation with rapid ventricular response at a rate of 173. She did appear somewhat dizzy and her blood pressure was low around 100 systolic.  I admitted her to the hospital and she was an inpatient for 1 week.  She was diuresed and had better rate control of her A. fib. She did undergo a TEE, which demonstrated a left atrial appendage thrombus. Based on that she could not be cardioverted and is recommended that she remain on warfarin with therapeutic INRs for at least one month.  Discharge weight was 168 and she is 166 pounds today.  She does report some mild edema which is dependent in the right leg but none in the left but does improve with elevation at night.  After her last office visit, recommended repeat TEE which demonstrated resolution of her left atrial appendage thrombus. She then underwent elective cardioversion which was successful in converting her back to sinus rhythm. After she returned today I asked her how she was feeling and she reports that she feels "great". I asked her she was feeling any palpitations or irregularity to her heart beat and she denied it. I then presented her with her EKG which demonstrates that she has gone back into atrial fibrillation, suggesting that she is  not symptomatic from A. fib and generally unaware of it. She does report that she takes her blood pressure and pulse every day and noted that after about 5 days of her heart rate being in the mid 50s it increased up into the 70s and 80s, signifying that is probably when she went back into A. Fib.  She's had marked improvement in her swelling and is now down 10 pounds. She's been wearing her lower extremity compression stockings with marked improvement. She had recent laboratory work from her primary care provider which showed an elevated triglyceride level of 253, total cholesterol 154, HDL 36 and LDL 67. It should be noted that these were nonfasting labs. There was a increase in her Lipitor to 20 mg daily.  I saw Falen back in the office today. She reports a big improvement in her shortness of breath however she still has persistent shortness of breath with exertion. Her weight is down now over 15 pounds with diuresis and she remains euvolemic. There is no evidence of any peripheral edema. She does report of breathing is better than it had been in the past but she still says she gets short of breath with walking long distances.   Faithe seems to be doing very well. In the office today she reports stable shortness of breath. She is asking to come off some medicines if possible. She remains in A. fib with a slow ventricular response. Her INR has been therapeutic and followed by her primary. She recently had  lab work including a lipid profile. Her triglycerides were elevated in the 300s however this was nonfasting.  Mrs. Southard returns today for follow-up. She denies any worsening chest pain or worsening shortness of breath. Her weight is been fairly stable. Her swelling is stable. She is therapeutic with her INR. Blood pressure is at goal. She has persistent A. fib and his rate controlled at 97. She recently had lab work which shows excellent cholesterol control with total cholesterol 124, triglycerides 84, HDL  50 and LDL 57. Renal function is normal with creatinine of 0.8. CBC is also normal and vitamin D was normal at 49 on replacement therapy. ALT is mildly elevated at 50.  12/06/2015  Jozalynn Noyce was seen back today in the office in hospital follow-up. Unfortunately she suffered a hip fracture and went into A. fib with RVR. She was placed on amiodarone which helped to slow her rate and she was considered for TEE cardioversion however due to the paroxysmal nature of this she was just treated with amiodarone. Unfortunately amiodarone caused her significant shaking and this was dose reduced with an improvement in her symptoms although she still has some shakiness. She is now however to feed herself which was difficult before due to her shaking. She remains in atrial fibrillation and 95 but is asymptomatic with this. In the past she's been cardioverted but obviously failed to hold sinus rhythm. She is now re-anticoagulated on warfarin. There was a discussion about outpatient cardioversion. A repeat echo was performed and x-ray showed preserved systolic function.  02/05/2016  I had the pleasure seeing Bama back today for follow-up. At her last office visit I discontinued her amiodarone completely as she was having tremors and muscle soreness. The symptoms have improved significantly but not totally gone away. I did increase her Lopressor for additional rate control her heart rate seems to be fairly well controlled. She denies any worsening shortness of breath or lower extremity swelling. She reports having a therapeutic INR which is followed by her primary care provider.  04/24/2016  Alyria was seen back today in follow-up. After adjusting the medications she still has some low blood pressures at times. She does not notice any change in her dizziness. Blood pressure is stable however weight has gone up from 156-162 pounds. She does notany worsening shortness of breath or orthopnea. Heart rate is in the 80s and  occasionally gets over 100 per generally is between 60 and 100 with A. fib. EKG shows A. fib at 83 today. He also reports some persistent muscle weakness. She initially had some tremors and muscle weakness which was thought related to amiodarone, but that has long been discontinued.  PMHx:  Past Medical History:  Diagnosis Date  . Arthritis    "right knee" (11/10/2015)  . Chronic diastolic (congestive) heart failure   . DOE (dyspnea on exertion)   . DVT (deep venous thrombosis) (HCC) "years ago"  . Dyslipidemia   . Fracture of left hip (HCC) 11/09/2015   "fell"  . History of nuclear stress test 10/2009   normal exam  . Hypertension   . PAF (paroxysmal atrial fibrillation) (HCC)   . Rheumatoid arthritis (HCC) 1970   "it's in remission" (11/10/2015)    Past Surgical History:  Procedure Laterality Date  . ABDOMINAL HYSTERECTOMY  1996  . BLADDER SUSPENSION  X 2  . BREAST BIOPSY Right 1984   "benign"  . CARDIOVERSION N/A 11/01/2013   Procedure: CARDIOVERSION;  Surgeon: Chrystie Nose, MD;  Location: [facility]  ENDOSCOPY;  Service: Cardiovascular;  Laterality: N/A;  . CATARACT EXTRACTION W/ INTRAOCULAR LENS  IMPLANT, BILATERAL Bilateral 2003  . RECTOCELE REPAIR    . TEE WITHOUT CARDIOVERSION N/A 11/01/2013   Procedure: TRANSESOPHAGEAL ECHOCARDIOGRAM (TEE);  Surgeon: Chrystie Nose, MD;  Location: [facility] ENDOSCOPY;  Service: Cardiovascular;  Laterality: N/A;  . TONSILLECTOMY    . TOTAL HIP ARTHROPLASTY Left 11/11/2015   Procedure:  HEMI ARTHROPLASTY ANTERIOR APPROACH LEFT;  Surgeon: Kathryne Hitch, MD;  Location: [facility] OR;  Service: Orthopedics;  Laterality: Left;  . TRANSTHORACIC ECHOCARDIOGRAM  07/22/2012   EF 55-60%; mild AV regurg; mild MR; mild-mod TR    FAMHx:  Family History  Problem Relation Age of Onset  . Heart disease Mother     SOCHx:   reports that she has never smoked. She has never used smokeless tobacco. She reports that she does not drink alcohol or use drugs.  ALLERGIES:    Allergies  Allergen Reactions  . Amiodarone Other (See Comments)    Tremor, muscle weakness    ROS: Pertinent items noted in HPI and remainder of comprehensive ROS otherwise negative.  HOME MEDS: Current Outpatient Prescriptions  Medication Sig Dispense Refill  . atorvastatin (LIPITOR) 20 MG tablet Take 20 mg by mouth daily. HOLD FOR 2 WEEKS    . diclofenac sodium (VOLTAREN) 1 % GEL Apply 2 g topically 4 (four) times daily. 1 Tube 0  . Fexofenadine HCl (ALLEGRA PO) Take 1 tablet by mouth as needed (allergies).     . Fluticasone Propionate (FLONASE NA) Place 1 spray into the nose as needed (allergies).     . furosemide (LASIX) 40 MG tablet Take 1 tablet (40 mg total) by mouth daily. 30 tablet 6  . metoprolol tartrate (LOPRESSOR) 25 MG tablet Take 1 tablet (25 mg total) by mouth 2 (two) times daily. 180 tablet 1  . warfarin (COUMADIN) 3 MG tablet Take 3 mg by mouth daily.    [name] diltiazem (CARDIZEM CD) 120 MG 24 hr capsule Take 1 capsule (120 mg total) by mouth daily. 90 capsule 1   No current facility-administered medications for this visit.     LABS/IMAGING: No results found for this or any previous visit (from the past 48 hour(s)). No results found.  VITALS: BP 135/85   Pulse 83   Ht 5\' 5"  (1.651 m)   Wt 162 lb (73.5 kg)   BMI 26.96 kg/m   EXAM: General appearance: alert and no distress Neck: no carotid bruit and no JVD Lungs: clear to auscultation bilaterally Heart: irregularly irregular rhythm Abdomen: soft, non-tender; bowel sounds normal; no masses,  no organomegaly Extremities: extremities normal, atraumatic, no cyanosis or edema Pulses: 2+ and symmetric . Skin: Skin color, texture, turgor normal. No rashes or lesions Neurologic: Grossly normal Psych: Normal  EKG: Atrial fibrillation with controlled ventricular response at 83  ASSESSMENT: 1. Chronic diastolic heart failure 2. Persistent atrial fibrillation - with slow ventricular response 3. History of LAA  thrombus - on anticoagulation 4. EF 60-65% 5. CHADSVASC 5-on chronic warfarin 6. Recent hip fracture  PLAN: 1.   Mrs. Winemiller has had some recent weight gain after I backed off on her Lasix. I also decreased her diltiazem but there is been no significant change in dizziness. I like to increase her Lasix back to 40 mg daily but will continue her doses of the other medications. She's been therapeutic on warfarin and systolic function has been stable. We will plan holding Lipitor for 2 weeks to see  if this improves her muscle aches and weakness. If not she should restart Lipitor, but if so she should contact us and we may need to find an alternative to her statin.  Follow-up with me in 6 months.  Chrystie Nose, MD, [facility] Attending Cardiologist [facility]  Chrystie Nose 04/24/2016, 6:56 PM

## 2016-04-24 NOTE — Patient Instructions (Addendum)
Medication Instructions:  INCREASE Lasix to 40mg  Take 1 tablet Once a day  HOLD Lipitor( Atorvastatin) for 2 weeks for muscles aches, if no better call office; if cramps get better restart Lipitor  Labwork: None   Testing/Procedures: None   Follow-Up: Your physician wants you to follow-up in: 6 months with Dr . You will receive a reminder letter in the mail two months in advance. If you don't receive a letter, please call our office to schedule the follow-up appointment.  Any Other Special Instructions Will Be Listed Below (If Applicable).     If you need a refill on your cardiac medications before your next appointment, please call your pharmacy.

## 2016-05-28 ENCOUNTER — Ambulatory Visit: Payer: Medicare Other | Admitting: Internal Medicine

## 2016-06-25 ENCOUNTER — Telehealth: Payer: Self-pay | Admitting: Internal Medicine

## 2016-06-25 NOTE — Telephone Encounter (Signed)
If weights are going up and she is more short of breath, I can see her this week. We can overbook her at the end of my quarter day patients on Friday morning.  Dr. Rexene Edison

## 2016-06-25 NOTE — Telephone Encounter (Signed)
New Message  Pt call requesting to speak with RN about getting seen sooner than available by Dr. Rennis Golden only. Pt states she is having some swelling in both feet and ankles. Please call back to discuss

## 2016-06-25 NOTE — Telephone Encounter (Signed)
Spoke with patient and she has been having some swelling in her ankles that goes down some at night  Denies any shortness of breath or significant weight gain She was concerned may be coming from the Hydrocodone/Acetaminaphin that PCP gave her for lower back pain Stated back pain severe but under good control with pain medication She can see her PCP tomorrow Advised to keep appointment with PCP to address pain Continue to weigh daily at home and watch sodium intake, call back if weight gain of 2-3 pounds overnight or 5 pounds in a week. Or if PCP feels she needs to follow up with cardiology  Will route to Dr Rennis Golden and Herb Grays RN

## 2016-06-26 NOTE — Telephone Encounter (Signed)
Called patient to check in. She states she has an appt with her PCP this afternoon at 3pm.

## 2016-08-02 ENCOUNTER — Telehealth: Payer: Self-pay | Admitting: Internal Medicine

## 2016-08-02 NOTE — Telephone Encounter (Signed)
Ok to hold warfarin for 5 days prior to back surgery and restart afterwards.  Dr. Rexene Edison

## 2016-08-02 NOTE — Telephone Encounter (Signed)
[facility] requests clearance for: 1. Type of surgery: ESI L5-S1 under general anesthesia 2. Date of surgery: TBD 3. Surgeon: Dr. Frederik Pear 4. Medications that need to be held & how long: warfarin 5. Fax and/or Phone: (p) [phone]  (f) [phone]  Send clearance w/most recent letter/any cardiac testing reports

## 2016-08-05 NOTE — Telephone Encounter (Signed)
Clearance note routed via EPIC 

## 2016-08-06 ENCOUNTER — Telehealth: Payer: Self-pay | Admitting: Internal Medicine

## 2016-08-06 NOTE — Telephone Encounter (Signed)
Left message for jennifer, clearance has been sent via EPIC and faxed to the number provided. Will re-fax note from 08-02-16.

## 2016-08-06 NOTE — Telephone Encounter (Signed)
New Message    Per pt office hasn't received faxed clearance form yet. Pt wants nurse to call office at [phone]

## 2016-08-06 NOTE — Telephone Encounter (Signed)
Clearance telephone note from 08-02-16 faxed to the number provided.

## 2016-08-06 NOTE — Telephone Encounter (Signed)
Kendra Hughes said they need you to fax clearance and couumadin instructions please to [phone].

## 2016-09-05 ENCOUNTER — Ambulatory Visit (INDEPENDENT_AMBULATORY_CARE_PROVIDER_SITE_OTHER): Payer: Medicare Other | Admitting: Internal Medicine

## 2016-09-05 VITALS — BP 116/78 | HR 102 | Ht 65.5 in | Wt 153.2 lb

## 2016-09-05 DIAGNOSIS — I481 Persistent atrial fibrillation: Secondary | ICD-10-CM

## 2016-09-05 DIAGNOSIS — Z01810 Encounter for preprocedural cardiovascular examination: Secondary | ICD-10-CM | POA: Diagnosis not present

## 2016-09-05 DIAGNOSIS — I4819 Other persistent atrial fibrillation: Secondary | ICD-10-CM

## 2016-09-05 DIAGNOSIS — I10 Essential (primary) hypertension: Secondary | ICD-10-CM | POA: Diagnosis not present

## 2016-09-05 DIAGNOSIS — Z7901 Long term (current) use of anticoagulants: Secondary | ICD-10-CM

## 2016-09-05 NOTE — Progress Notes (Signed)
OFFICE NOTE  Chief Complaint:  Preoperative risk assessment  Primary Care Physician: Carmin Richmond, MD  HPI:  Kendra Hughes is an 83 year old female who I've been following for some time with a history of paroxysmal atrial fibrillation. Despite her CHADS2 score of 2, she has refused warfarin anticoagulation in the past, opting for aspirin which are documented extensively he is insufficient to protect her from stroke. She had worsening shortness of breath and apparently she and her husband tried to call the office to schedule an appointment with me but could not get an appointment.  When I saw her in the office a few months ago, she was notably breathless and can hardly finish sentences. An EKG demonstrated atrial fibrillation with rapid ventricular response at a rate of 173. She did appear somewhat dizzy and her blood pressure was low around 100 systolic.  I admitted her to the hospital and she was an inpatient for 1 week.  She was diuresed and had better rate control of her A. fib. She did undergo a TEE, which demonstrated a left atrial appendage thrombus. Based on that she could not be cardioverted and is recommended that she remain on warfarin with therapeutic INRs for at least one month.  Discharge weight was 168 and she is 166 pounds today.  She does report some mild edema which is dependent in the right leg but none in the left but does improve with elevation at night.  After her last office visit, recommended repeat TEE which demonstrated resolution of her left atrial appendage thrombus. She then underwent elective cardioversion which was successful in converting her Hughes to sinus rhythm. After she returned today I asked her how she was feeling and she reports that she feels "great". I asked her she was feeling any palpitations or irregularity to her heart beat and she denied it. I then presented her with her EKG which demonstrates that she has gone Hughes into atrial fibrillation, suggesting  that she is not symptomatic from A. fib and generally unaware of it. She does report that she takes her blood pressure and pulse every day and noted that after about 5 days of her heart rate being in the mid 50s it increased up into the 70s and 80s, signifying that is probably when she went Hughes into A. Fib.  She's had marked improvement in her swelling and is now down 10 pounds. She's been wearing her lower extremity compression stockings with marked improvement. She had recent laboratory work from her primary care provider which showed an elevated triglyceride level of 253, total cholesterol 154, HDL 36 and LDL 67. It should be noted that these were nonfasting labs. There was a increase in her Lipitor to 20 mg daily.  I saw Kendra Hughes Hughes in the office today. She reports a big improvement in her shortness of breath however she still has persistent shortness of breath with exertion. Her weight is down now over 15 pounds with diuresis and she remains euvolemic. There is no evidence of any peripheral edema. She does report of breathing is better than it had been in the past but she still says she gets short of breath with walking long distances.   Kendra Hughes seems to be doing very well. In the office today she reports stable shortness of breath. She is asking to come off some medicines if possible. She remains in A. fib with a slow ventricular response. Her INR has been therapeutic and followed by her primary. She recently  had lab work including a lipid profile. Her triglycerides were elevated in the 300s however this was nonfasting.  Kendra Hughes returns today for follow-up. She denies any worsening chest pain or worsening shortness of breath. Her weight is been fairly stable. Her swelling is stable. She is therapeutic with her INR. Blood pressure is at goal. She has persistent A. fib and his rate controlled at 97. She recently had lab work which shows excellent cholesterol control with total cholesterol 124,  triglycerides 84, HDL 50 and LDL 57. Renal function is normal with creatinine of 0.8. CBC is also normal and vitamin D was normal at 49 on replacement therapy. ALT is mildly elevated at 50.  12/06/2015  Kendra Hughes was seen Hughes today in the office in hospital follow-up. Unfortunately she suffered a hip fracture and went into A. fib with RVR. She was placed on amiodarone which helped to slow her rate and she was considered for TEE cardioversion however due to the paroxysmal nature of this she was just treated with amiodarone. Unfortunately amiodarone caused her significant shaking and this was dose reduced with an improvement in her symptoms although she still has some shakiness. She is now however to feed herself which was difficult before due to her shaking. She remains in atrial fibrillation and 95 but is asymptomatic with this. In the past she's been cardioverted but obviously failed to hold sinus rhythm. She is now re-anticoagulated on warfarin. There was a discussion about outpatient cardioversion. A repeat echo was performed and x-ray showed preserved systolic function.  02/05/2016  I had the pleasure seeing Kendra Hughes today for follow-up. At her last office visit I discontinued her amiodarone completely as she was having tremors and muscle soreness. The symptoms have improved significantly but not totally gone away. I did increase her Lopressor for additional rate control her heart rate seems to be fairly well controlled. She denies any worsening shortness of breath or lower extremity swelling. She reports having a therapeutic INR which is followed by her primary care provider.  04/24/2016  Kendra Hughes was seen Hughes today in follow-up. After adjusting the medications she still has some low blood pressures at times. She does not notice any change in her dizziness. Blood pressure is stable however weight has gone up from 156-162 pounds. She does notany worsening shortness of breath or orthopnea. Heart rate is  in the 80s and occasionally gets over 100 per generally is between 60 and 100 with A. fib. EKG shows A. fib at 83 today. He also reports some persistent muscle weakness. She initially had some tremors and muscle weakness which was thought related to amiodarone, but that has long been discontinued.  09/05/2016  Kendra Hughes returns today for follow-up. She is actually here for preoperative risk assessment. She's planning on a possible lumbar spine surgery she was found to have compression fracture. She's having lumbar radiculopathy related to that. This may been related to her prior femoral neck fracture which was in May of last year. She did undergo surgery without consultation at that time. She has however had pneumonia and is generally fairly frail given her age of 42. I previous he recommended holding her warfarin for 5 days prior to surgery but cannot estimate her risk anything less than intermediate. We discussed the surgery at some more detail today and she seems to have some hesitation as she has noted a mild improvement in her Hughes pain symptoms recently.  PMHx:  Past Medical History:  Diagnosis Date  .  Arthritis    "right knee" (11/10/2015)  . Chronic diastolic (congestive) heart failure   . DOE (dyspnea on exertion)   . DVT (deep venous thrombosis) (HCC) "years ago"  . Dyslipidemia   . Fracture of left hip (HCC) 11/09/2015   "fell"  . History of nuclear stress test 10/2009   normal exam  . Hypertension   . PAF (paroxysmal atrial fibrillation) (HCC)   . Rheumatoid arthritis (HCC) 1970   "it's in remission" (11/10/2015)    Past Surgical History:  Procedure Laterality Date  . ABDOMINAL HYSTERECTOMY  1996  . BLADDER SUSPENSION  X 2  . BREAST BIOPSY Right 1984   "benign"  . CARDIOVERSION N/A 11/01/2013   Procedure: CARDIOVERSION;  Surgeon: Chrystie Nose, MD;  Location: [facility] ENDOSCOPY;  Service: Cardiovascular;  Laterality: N/A;  . CATARACT EXTRACTION W/ INTRAOCULAR LENS  IMPLANT, BILATERAL  Bilateral 2003  . RECTOCELE REPAIR    . TEE WITHOUT CARDIOVERSION N/A 11/01/2013   Procedure: TRANSESOPHAGEAL ECHOCARDIOGRAM (TEE);  Surgeon: Chrystie Nose, MD;  Location: [facility] ENDOSCOPY;  Service: Cardiovascular;  Laterality: N/A;  . TONSILLECTOMY    . TOTAL HIP ARTHROPLASTY Left 11/11/2015   Procedure:  HEMI ARTHROPLASTY ANTERIOR APPROACH LEFT;  Surgeon: Kathryne Hitch, MD;  Location: [facility] OR;  Service: Orthopedics;  Laterality: Left;  . TRANSTHORACIC ECHOCARDIOGRAM  07/22/2012   EF 55-60%; mild AV regurg; mild MR; mild-mod TR    FAMHx:  Family History  Problem Relation Age of Onset  . Heart disease Mother     SOCHx:   reports that she has never smoked. She has never used smokeless tobacco. She reports that she does not drink alcohol or use drugs.  ALLERGIES:  Allergies  Allergen Reactions  . Amiodarone Other (See Comments)    Tremor, muscle weakness    ROS: Pertinent items noted in HPI and remainder of comprehensive ROS otherwise negative.  HOME MEDS: Current Outpatient Prescriptions  Medication Sig Dispense Refill  . atorvastatin (LIPITOR) 20 MG tablet Take 20 mg by mouth daily. HOLD FOR 2 WEEKS    . diclofenac sodium (VOLTAREN) 1 % GEL Apply 2 g topically 4 (four) times daily. 1 Tube 0  . Fexofenadine HCl (ALLEGRA PO) Take 1 tablet by mouth as needed (allergies).     . Fluticasone Propionate (FLONASE NA) Place 1 spray into the nose as needed (allergies).     . furosemide (LASIX) 40 MG tablet Take 1 tablet (40 mg total) by mouth daily. 30 tablet 6  . metoprolol tartrate (LOPRESSOR) 25 MG tablet Take 1 tablet (25 mg total) by mouth 2 (two) times daily. 180 tablet 1  . oxyCODONE (OXY IR/ROXICODONE) 5 MG immediate release tablet Take 5 mg by mouth every 4 (four) hours as needed.    . warfarin (COUMADIN) 3 MG tablet Take 3 mg by mouth daily.    [name] diltiazem (CARDIZEM CD) 120 MG 24 hr capsule Take 1 capsule (120 mg total) by mouth daily. 90 capsule 1   No current  facility-administered medications for this visit.     LABS/IMAGING: No results found for this or any previous visit (from the past 48 hour(s)). No results found.  VITALS: BP 116/78 (BP Location: Left Arm, Patient Position: Sitting, Cuff Size: Normal)   Pulse (!) 102   Ht 5' 5.5" (1.664 m)   Wt 153 lb 3.2 oz (69.5 kg)   BMI 25.11 kg/m   EXAM: General appearance: alert and no distress Neck: no carotid bruit and no JVD Lungs: clear  to auscultation bilaterally Heart: irregularly irregular rhythm Abdomen: soft, non-tender; bowel sounds normal; no masses,  no organomegaly Extremities: extremities normal, atraumatic, no cyanosis or edema Pulses: 2+ and symmetric . Skin: Skin color, texture, turgor normal. No rashes or lesions Neurologic: Grossly normal Psych: Normal  EKG: Atrial fibrillation with rapid ventricular response of 102.  ASSESSMENT: 1. Intermediate risk for spinal surgery 2. Chronic diastolic heart failure 3. Persistent atrial fibrillation - with variable ventricular response 4. 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 EF 60-65% 6. CHADSVASC 5-on chronic warfarin 7. S/p L hip fracture (2017)  PLAN: 1.   Kendra Hughes is likely at intermediate risk for spine surgery based on her numerous medical problems and age. Although she successfully underwent urgent surgery for her femoral neck fracture last year, this surgery could be more complex and it's not clear if it will significant only improve her pain symptoms or not. She says that she has had some improvement in her pain although he is using oxycodone. She has some hesitation about this surgery given the possible risks which were outlined appropriately. Ultimately it is her decision. I think she could likely get through surgery but would not be considered low risk. I will discuss this with Dr. Cameron Ali per his request and would recommend holding warfarin for at least 5 days prior to surgery as previously  suggested.  Follow-up 3 months.  Chrystie Nose, MD, [facility] Attending Cardiologist [facility]  Kendra Hughes 09/05/2016, 1:22 PM

## 2016-09-05 NOTE — Patient Instructions (Signed)
Your physician wants you to follow-up in: THREE MONTHS with Dr. Hilty. You will receive a reminder letter in the mail two months in advance. If you don't receive a letter, please call our office to schedule the follow-up appointment.  

## 2016-09-06 ENCOUNTER — Telehealth: Payer: Self-pay | Admitting: Internal Medicine

## 2016-09-06 NOTE — Telephone Encounter (Signed)
Returned call to [facility] no answer.LMTC.

## 2016-09-06 NOTE — Telephone Encounter (Signed)
Follow up    Dr. Cameron Ali is calling to speak to Dr. Rennis Golden about this pt. He can be reached at [phone].

## 2016-09-06 NOTE — Telephone Encounter (Signed)
Clearance request for lumbar decompression/fusion - L4-pelvis received -- Dr. Frederik Pear [facility]  [address] [place], [place]  ([place]  [phone]  Clearance (medically optimal for surgery), last office note (08/08/16) faxed to number denoted Patient can hold warfarin 5 days pre-op

## 2016-09-06 NOTE — Telephone Encounter (Signed)
Dr. Rennis Golden attempted to contact this MD. Phone call went to VM

## 2016-09-06 NOTE — Telephone Encounter (Signed)
Dr. Rennis Golden spoke with Dr. Cameron Ali regarding patient.

## 2016-09-06 NOTE — Telephone Encounter (Signed)
New Message    Pt is medically complex and he would like to get your feedback before the pt has surgery in March

## 2016-10-18 ENCOUNTER — Ambulatory Visit: Payer: Medicare Other | Admitting: Internal Medicine

## 2016-12-23 ENCOUNTER — Ambulatory Visit (INDEPENDENT_AMBULATORY_CARE_PROVIDER_SITE_OTHER): Payer: Medicare Other | Admitting: Internal Medicine

## 2016-12-23 ENCOUNTER — Encounter: Payer: Self-pay | Admitting: Internal Medicine

## 2016-12-23 VITALS — BP 142/86 | HR 100 | Ht 65.0 in | Wt 154.4 lb

## 2016-12-23 DIAGNOSIS — I10 Essential (primary) hypertension: Secondary | ICD-10-CM | POA: Diagnosis not present

## 2016-12-23 DIAGNOSIS — I4891 Unspecified atrial fibrillation: Secondary | ICD-10-CM | POA: Diagnosis not present

## 2016-12-23 DIAGNOSIS — Z7901 Long term (current) use of anticoagulants: Secondary | ICD-10-CM

## 2016-12-23 DIAGNOSIS — I519 Heart disease, unspecified: Secondary | ICD-10-CM | POA: Diagnosis not present

## 2016-12-23 DIAGNOSIS — I5189 Other ill-defined heart diseases: Secondary | ICD-10-CM

## 2016-12-23 MED ORDER — METOPROLOL TARTRATE 25 MG PO TABS: 25.0000 mg | ORAL_TABLET | Freq: Two times a day (BID) | ORAL | 3 refills | Status: DC

## 2016-12-23 MED ORDER — FUROSEMIDE 40 MG PO TABS: 40.0000 mg | ORAL_TABLET | Freq: Every day | ORAL | 3 refills | Status: DC

## 2016-12-23 NOTE — Patient Instructions (Signed)
Dr Hilty recommends that you schedule a follow-up appointment in 6 months. You will receive a reminder letter in the mail two months in advance. If you don't receive a letter, please call our office to schedule the follow-up appointment.  If you need a refill on your cardiac medications before your next appointment, please call your pharmacy. 

## 2016-12-23 NOTE — Progress Notes (Signed)
OFFICE NOTE  Chief Complaint:  Routine follow-up  Primary Care Physician: Carmin Richmond, MD  HPI:  Kendra Hughes is an 83 year old female who I've been following for some time with a history of paroxysmal atrial fibrillation. Despite her CHADS2 score of 2, she has refused warfarin anticoagulation in the past, opting for aspirin which are documented extensively he is insufficient to protect her from stroke. She had worsening shortness of breath and apparently she and her husband tried to call the office to schedule an appointment with me but could not get an appointment.  When I saw her in the office a few months ago, she was notably breathless and can hardly finish sentences. An EKG demonstrated atrial fibrillation with rapid ventricular response at a rate of 173. She did appear somewhat dizzy and her blood pressure was low around 100 systolic.  I admitted her to the hospital and she was an inpatient for 1 week.  She was diuresed and had better rate control of her A. fib. She did undergo a TEE, which demonstrated a left atrial appendage thrombus. Based on that she could not be cardioverted and is recommended that she remain on warfarin with therapeutic INRs for at least one month.  Discharge weight was 168 and she is 166 pounds today.  She does report some mild edema which is dependent in the right leg but none in the left but does improve with elevation at night.  After her last office visit, recommended repeat TEE which demonstrated resolution of her left atrial appendage thrombus. She then underwent elective cardioversion which was successful in converting her back to sinus rhythm. After she returned today I asked her how she was feeling and she reports that she feels "great". I asked her she was feeling any palpitations or irregularity to her heart beat and she denied it. I then presented her with her EKG which demonstrates that she has gone back into atrial fibrillation, suggesting that she  is not symptomatic from A. fib and generally unaware of it. She does report that she takes her blood pressure and pulse every day and noted that after about 5 days of her heart rate being in the mid 50s it increased up into the 70s and 80s, signifying that is probably when she went back into A. Fib.  She's had marked improvement in her swelling and is now down 10 pounds. She's been wearing her lower extremity compression stockings with marked improvement. She had recent laboratory work from her primary care provider which showed an elevated triglyceride level of 253, total cholesterol 154, HDL 36 and LDL 67. It should be noted that these were nonfasting labs. There was a increase in her Lipitor to 20 mg daily.  I saw Kendra Hughes back in the office today. She reports a big improvement in her shortness of breath however she still has persistent shortness of breath with exertion. Her weight is down now over 15 pounds with diuresis and she remains euvolemic. There is no evidence of any peripheral edema. She does report of breathing is better than it had been in the past but she still says she gets short of breath with walking long distances.   Kendra Hughes seems to be doing very well. In the office today she reports stable shortness of breath. She is asking to come off some medicines if possible. She remains in A. fib with a slow ventricular response. Her INR has been therapeutic and followed by her primary. She recently  had lab work including a lipid profile. Her triglycerides were elevated in the 300s however this was nonfasting.  Kendra Hughes returns today for follow-up. She denies any worsening chest pain or worsening shortness of breath. Her weight is been fairly stable. Her swelling is stable. She is therapeutic with her INR. Blood pressure is at goal. She has persistent A. fib and his rate controlled at 97. She recently had lab work which shows excellent cholesterol control with total cholesterol 124, triglycerides 84,  HDL 50 and LDL 57. Renal function is normal with creatinine of 0.8. CBC is also normal and vitamin D was normal at 49 on replacement therapy. ALT is mildly elevated at 50.  12/06/2015  Kendra Hughes was seen back today in the office in hospital follow-up. Unfortunately she suffered a hip fracture and went into A. fib with RVR. She was placed on amiodarone which helped to slow her rate and she was considered for TEE cardioversion however due to the paroxysmal nature of this she was just treated with amiodarone. Unfortunately amiodarone caused her significant shaking and this was dose reduced with an improvement in her symptoms although she still has some shakiness. She is now however to feed herself which was difficult before due to her shaking. She remains in atrial fibrillation and 95 but is asymptomatic with this. In the past she's been cardioverted but obviously failed to hold sinus rhythm. She is now re-anticoagulated on warfarin. There was a discussion about outpatient cardioversion. A repeat echo was performed and x-ray showed preserved systolic function.  02/05/2016  I had the pleasure seeing Kendra Hughes back today for follow-up. At her last office visit I discontinued her amiodarone completely as she was having tremors and muscle soreness. The symptoms have improved significantly but not totally gone away. I did increase her Lopressor for additional rate control her heart rate seems to be fairly well controlled. She denies any worsening shortness of breath or lower extremity swelling. She reports having a therapeutic INR which is followed by her primary care provider.  04/24/2016  Kendra Hughes was seen back today in follow-up. After adjusting the medications she still has some low blood pressures at times. She does not notice any change in her dizziness. Blood pressure is stable however weight has gone up from 156-162 pounds. She does notany worsening shortness of breath or orthopnea. Heart rate is in the 80s and  occasionally gets over 100 per generally is between 60 and 100 with A. fib. EKG shows A. fib at 83 today. He also reports some persistent muscle weakness. She initially had some tremors and muscle weakness which was thought related to amiodarone, but that has long been discontinued.  09/05/2016  Kendra Hughes returns today for follow-up. She is actually here for preoperative risk assessment. She's planning on a possible lumbar spine surgery she was found to have compression fracture. She's having lumbar radiculopathy related to that. This may been related to her prior femoral neck fracture which was in May of last year. She did undergo surgery without consultation at that time. She has however had pneumonia and is generally fairly frail given her age of 35. I previous he recommended holding her warfarin for 5 days prior to surgery but cannot estimate her risk anything less than intermediate. We discussed the surgery at some more detail today and she seems to have some hesitation as she has noted a mild improvement in her back pain symptoms recently.  12/23/2016  Kendra Hughes returns for follow-up. She has successfully come  off of her narcotics for back pain. She had an L5 compression fracture and had contemplated surgery but ended up not having surgery. She underwent some back injections and reports improvement in her pain. She still has some limited mobility and requires a walker. She inquired today is whether or not she should be on some treatment for osteoporosis. She says she's not had a bone density scan before which would be recommended and certainly given her history of compression fracture and hip fracture she should benefit even at her advanced age from treatment to improve bone density. She appears euvolemic on exam today weight is within 1 pound of her prior weight. She remains in long-standing persistent if not permanent atrial fibrillation with a heart rate of around 100. She is resume chronic warfarin  therapy and is back on her statin medication. Recent LDL-C was 52.  PMHx:  Past Medical History:  Diagnosis Date  . Arthritis    "right knee" (11/10/2015)  . Chronic diastolic (congestive) heart failure (HCC)   . DOE (dyspnea on exertion)   . DVT (deep venous thrombosis) (HCC) "years ago"  . Dyslipidemia   . Fracture of left hip (HCC) 11/09/2015   "fell"  . History of nuclear stress test 10/2009   normal exam  . Hypertension   . PAF (paroxysmal atrial fibrillation) (HCC)   . Rheumatoid arthritis (HCC) 1970   "it's in remission" (11/10/2015)    Past Surgical History:  Procedure Laterality Date  . ABDOMINAL HYSTERECTOMY  1996  . BLADDER SUSPENSION  X 2  . BREAST BIOPSY Right 1984   "benign"  . CARDIOVERSION N/A 11/01/2013   Procedure: CARDIOVERSION;  Surgeon: Chrystie Nose, MD;  Location: [facility] ENDOSCOPY;  Service: Cardiovascular;  Laterality: N/A;  . CATARACT EXTRACTION W/ INTRAOCULAR LENS  IMPLANT, BILATERAL Bilateral 2003  . RECTOCELE REPAIR    . TEE WITHOUT CARDIOVERSION N/A 11/01/2013   Procedure: TRANSESOPHAGEAL ECHOCARDIOGRAM (TEE);  Surgeon: Chrystie Nose, MD;  Location: [facility] ENDOSCOPY;  Service: Cardiovascular;  Laterality: N/A;  . TONSILLECTOMY    . TOTAL HIP ARTHROPLASTY Left 11/11/2015   Procedure:  HEMI ARTHROPLASTY ANTERIOR APPROACH LEFT;  Surgeon: Kathryne Hitch, MD;  Location[facility] OR;  Service: Orthopedics;  Laterality: Left;  . TRANSTHORACIC ECHOCARDIOGRAM  07/22/2012   EF 55-60%; mild AV regurg; mild MR; mild-mod TR    FAMHx:  Family History  Problem Relation Age of Onset  . Heart disease Mother     SOCHx:   reports that she has never smoked. She has never used smokeless tobacco. She reports that she does not drink alcohol or use drugs.  ALLERGIES:  Allergies  Allergen Reactions  . Amiodarone Other (See Comments)    Tremor, muscle weakness    ROS: Pertinent items noted in HPI and remainder of comprehensive ROS otherwise negative.  HOME  MEDS: Current Outpatient Prescriptions  Medication Sig Dispense Refill  . atorvastatin (LIPITOR) 20 MG tablet Take 20 mg by mouth daily.    . diclofenac sodium (VOLTAREN) 1 % GEL Apply 2 g topically 4 (four) times daily. 1 Tube 0  . diltiazem (CARDIZEM CD) 120 MG 24 hr capsule Take 1 capsule (120 mg total) by mouth daily. 90 capsule 1  . Fexofenadine HCl (ALLEGRA PO) Take 1 tablet by mouth as needed (allergies).     . Fluticasone Propionate (FLONASE NA) Place 1 spray into the nose as needed (allergies).     . furosemide (LASIX) 40 MG tablet Take 1 tablet (40 mg total) by  mouth daily. 90 tablet 3  . metoprolol tartrate (LOPRESSOR) 25 MG tablet Take 1 tablet (25 mg total) by mouth 2 (two) times daily. 180 tablet 3  . warfarin (COUMADIN) 3 MG tablet Take 3 mg by mouth daily.     No current facility-administered medications for this visit.     LABS/IMAGING: No results found for this or any previous visit (from the past 48 hour(s)). No results found.  VITALS: BP (!) 142/86   Pulse 100   Ht 5\' 5"  (1.651 m)   Wt 154 lb 6.4 oz (70 kg)   BMI 25.69 kg/m   EXAM: General appearance: alert and no distress Neck: no carotid bruit and no JVD Lungs: clear to auscultation bilaterally Heart: irregularly irregular rhythm and S1, S2 normal Abdomen: soft, non-tender; bowel sounds normal; no masses,  no organomegaly Extremities: varicose veins noted Pulses: 2+ and symmetric Skin: Skin color, texture, turgor normal. No rashes or lesions Neurologic: Grossly normal Psych: Normal  EKG: Atrial fibrillation with rapid ventricular response of 100  ASSESSMENT: 1. Chronic diastolic heart failure 2. Longstanding persistent atrial fibrillation - with variable ventricular response 3. 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 LVEF 60-65% 5. CHADSVASC 5-on chronic warfarin 6. S/p L hip fracture (2017) 7. S/p L5 compression fracture 8. Presumed osteoporosis  PLAN: 1.   Kendra Hughes elected not to  have back surgery after her L5 compression fracture. This is managed with injections and improved over time. She is now off of pain medication. She inquired about whether she should be on any treatment for osteoporosis. She says she has not even had a bone density scan before and I would highly recommend she discuss this with her primary care provider and that she should go on to treatment to help stabilize her osteoporosis given prior fractures. She isn't long-standing persistent atrial fibrillation which is fairly well rate controlled at home. She denies any bleeding problems on warfarin. She appears euvolemic on exam and we renewed her Lasix and metoprolol today. Follow-up with me in 6 months or sooner as necessary.  Chrystie Nose, MD, [facility] Attending Cardiologist [facility]  Chrystie Nose 12/23/2016, 10:35 AM

## 2016-12-27 ENCOUNTER — Other Ambulatory Visit: Payer: Self-pay | Admitting: Internal Medicine

## 2016-12-30 NOTE — Telephone Encounter (Signed)
Rx(s) sent to pharmacy electronically.  

## 2017-03-17 ENCOUNTER — Other Ambulatory Visit: Payer: Self-pay | Admitting: Internal Medicine

## 2017-03-17 NOTE — Telephone Encounter (Signed)
Rx(s) sent to pharmacy electronically.  

## 2017-06-23 ENCOUNTER — Ambulatory Visit (INDEPENDENT_AMBULATORY_CARE_PROVIDER_SITE_OTHER): Payer: Medicare Other | Admitting: Internal Medicine

## 2017-06-23 ENCOUNTER — Encounter: Payer: Self-pay | Admitting: Internal Medicine

## 2017-06-23 VITALS — BP 124/69 | HR 115 | Ht 65.5 in | Wt 146.2 lb

## 2017-06-23 DIAGNOSIS — I4819 Other persistent atrial fibrillation: Secondary | ICD-10-CM

## 2017-06-23 DIAGNOSIS — R0602 Shortness of breath: Secondary | ICD-10-CM | POA: Diagnosis not present

## 2017-06-23 DIAGNOSIS — I481 Persistent atrial fibrillation: Secondary | ICD-10-CM

## 2017-06-23 DIAGNOSIS — I272 Pulmonary hypertension, unspecified: Secondary | ICD-10-CM | POA: Diagnosis not present

## 2017-06-23 DIAGNOSIS — I5032 Chronic diastolic (congestive) heart failure: Secondary | ICD-10-CM | POA: Diagnosis not present

## 2017-06-23 NOTE — Progress Notes (Signed)
OFFICE NOTE  Chief Complaint:  Follow-up, dyspnea  Primary Care Physician: Carmin Richmond, MD  HPI:  Kendra Hughes is an 83 year old female who I've been following for some time with a history of paroxysmal atrial fibrillation. Despite her CHADS2 score of 2, she has refused warfarin anticoagulation in the past, opting for aspirin which are documented extensively he is insufficient to protect her from stroke. She had worsening shortness of breath and apparently she and her husband tried to call the office to schedule an appointment with me but could not get an appointment.  When I saw her in the office a few months ago, she was notably breathless and can hardly finish sentences. An EKG demonstrated atrial fibrillation with rapid ventricular response at a rate of 173. She did appear somewhat dizzy and her blood pressure was low around 100 systolic.  I admitted her to the hospital and she was an inpatient for 1 week.  She was diuresed and had better rate control of her A. fib. She did undergo a TEE, which demonstrated a left atrial appendage thrombus. Based on that she could not be cardioverted and is recommended that she remain on warfarin with therapeutic INRs for at least one month.  Discharge weight was 168 and she is 166 pounds today.  She does report some mild edema which is dependent in the right leg but none in the left but does improve with elevation at night.  After her last office visit, recommended repeat TEE which demonstrated resolution of her left atrial appendage thrombus. She then underwent elective cardioversion which was successful in converting her back to sinus rhythm. After she returned today I asked her how she was feeling and she reports that she feels "great". I asked her she was feeling any palpitations or irregularity to her heart beat and she denied it. I then presented her with her EKG which demonstrates that she has gone back into atrial fibrillation, suggesting that she  is not symptomatic from A. fib and generally unaware of it. She does report that she takes her blood pressure and pulse every day and noted that after about 5 days of her heart rate being in the mid 50s it increased up into the 70s and 80s, signifying that is probably when she went back into A. Fib.  She's had marked improvement in her swelling and is now down 10 pounds. She's been wearing her lower extremity compression stockings with marked improvement. She had recent laboratory work from her primary care provider which showed an elevated triglyceride level of 253, total cholesterol 154, HDL 36 and LDL 67. It should be noted that these were nonfasting labs. There was a increase in her Lipitor to 20 mg daily.  I saw Kendra Hughes back in the office today. She reports a big improvement in her shortness of breath however she still has persistent shortness of breath with exertion. Her weight is down now over 15 pounds with diuresis and she remains euvolemic. There is no evidence of any peripheral edema. She does report of breathing is better than it had been in the past but she still says she gets short of breath with walking long distances.   Kendra Hughes seems to be doing very well. In the office today she reports stable shortness of breath. She is asking to come off some medicines if possible. She remains in A. fib with a slow ventricular response. Her INR has been therapeutic and followed by her primary. She recently  had lab work including a lipid profile. Her triglycerides were elevated in the 300s however this was nonfasting.  Kendra Hughes returns today for follow-up. She denies any worsening chest pain or worsening shortness of breath. Her weight is been fairly stable. Her swelling is stable. She is therapeutic with her INR. Blood pressure is at goal. She has persistent A. fib and his rate controlled at 97. She recently had lab work which shows excellent cholesterol control with total cholesterol 124, triglycerides 84,  HDL 50 and LDL 57. Renal function is normal with creatinine of 0.8. CBC is also normal and vitamin D was normal at 49 on replacement therapy. ALT is mildly elevated at 50.  12/06/2015  Kendra Hughes was seen back today in the office in hospital follow-up. Unfortunately she suffered a hip fracture and went into A. fib with RVR. She was placed on amiodarone which helped to slow her rate and she was considered for TEE cardioversion however due to the paroxysmal nature of this she was just treated with amiodarone. Unfortunately amiodarone caused her significant shaking and this was dose reduced with an improvement in her symptoms although she still has some shakiness. She is now however to feed herself which was difficult before due to her shaking. She remains in atrial fibrillation and 95 but is asymptomatic with this. In the past she's been cardioverted but obviously failed to hold sinus rhythm. She is now re-anticoagulated on warfarin. There was a discussion about outpatient cardioversion. A repeat echo was performed and x-ray showed preserved systolic function.  02/05/2016  I had the pleasure seeing Kendra Hughes back today for follow-up. At her last office visit I discontinued her amiodarone completely as she was having tremors and muscle soreness. The symptoms have improved significantly but not totally gone away. I did increase her Lopressor for additional rate control her heart rate seems to be fairly well controlled. She denies any worsening shortness of breath or lower extremity swelling. She reports having a therapeutic INR which is followed by her primary care provider.  04/24/2016  Kendra Hughes was seen back today in follow-up. After adjusting the medications she still has some low blood pressures at times. She does not notice any change in her dizziness. Blood pressure is stable however weight has gone up from 156-162 pounds. She does notany worsening shortness of breath or orthopnea. Heart rate is in the 80s and  occasionally gets over 100 per generally is between 60 and 100 with A. fib. EKG shows A. fib at 83 today. He also reports some persistent muscle weakness. She initially had some tremors and muscle weakness which was thought related to amiodarone, but that has long been discontinued.  09/05/2016  Kendra Hughes returns today for follow-up. She is actually here for preoperative risk assessment. She's planning on a possible lumbar spine surgery she was found to have compression fracture. She's having lumbar radiculopathy related to that. This may been related to her prior femoral neck fracture which was in May of last year. She did undergo surgery without consultation at that time. She has however had pneumonia and is generally fairly frail given her age of 35. I previous he recommended holding her warfarin for 5 days prior to surgery but cannot estimate her risk anything less than intermediate. We discussed the surgery at some more detail today and she seems to have some hesitation as she has noted a mild improvement in her back pain symptoms recently.  12/23/2016  Kendra Hughes returns for follow-up. She has successfully come  off of her narcotics for back pain. She had an L5 compression fracture and had contemplated surgery but ended up not having surgery. She underwent some back injections and reports improvement in her pain. She still has some limited mobility and requires a walker. She inquired today is whether or not she should be on some treatment for osteoporosis. She says she's not had a bone density scan before which would be recommended and certainly given her history of compression fracture and hip fracture she should benefit even at her advanced age from treatment to improve bone density. She appears euvolemic on exam today weight is within 1 pound of her prior weight. She remains in long-standing persistent if not permanent atrial fibrillation with a heart rate of around 100. She is resume chronic warfarin  therapy and is back on her statin medication. Recent LDL-C was 52.  06/23/2017  Kendra Hughes returns today for follow-up.  She notes she has had progressive dyspnea.  She has a history of chronic diastolic congestive heart failure, however weight is been stable and she is compliant on Lasix.  She is also had a DVT and is on warfarin.  She is in persistent A. fib with generally good rate control.  Heart rate was elevated today but that was taken right after walking in the office.  She says even with very minimal exertion she gets short of breath.  It is notable that her last echo in 2017 showed moderate pulmonary hypertension with normal LV function.  Since she has had 2 fractures, I recommended a DEXA scan and to consider bisphosphonate therapy.  This is not yet been performed and she told me that her primary care physician did not believe there would be much benefit for her given her age.  PMHx:  Past Medical History:  Diagnosis Date  . Arthritis    "right knee" (11/10/2015)  . Chronic diastolic (congestive) heart failure (HCC)   . DOE (dyspnea on exertion)   . DVT (deep venous thrombosis) (HCC) "years ago"  . Dyslipidemia   . Fracture of left hip (HCC) 11/09/2015   "fell"  . History of nuclear stress test 10/2009   normal exam  . Hypertension   . PAF (paroxysmal atrial fibrillation) (HCC)   . Rheumatoid arthritis (HCC) 1970   "it's in remission" (11/10/2015)    Past Surgical History:  Procedure Laterality Date  . ABDOMINAL HYSTERECTOMY  1996  . BLADDER SUSPENSION  X 2  . BREAST BIOPSY Right 1984   "benign"  . CARDIOVERSION N/A 11/01/2013   Procedure: CARDIOVERSION;  Surgeon: Chrystie Nose, MD;  [facility] ENDOSCOPY;  Service: Cardiovascular;  Laterality: N/A;  . CATARACT EXTRACTION W/ INTRAOCULAR LENS  IMPLANT, BILATERAL Bilateral 2003  . RECTOCELE REPAIR    . TEE WITHOUT CARDIOVERSION N/A 11/01/2013   Procedure: TRANSESOPHAGEAL ECHOCARDIOGRAM (TEE);  Surgeon: Chrystie Nose, MD;   Location: [facility] ENDOSCOPY;  Service: Cardiovascular;  Laterality: N/A;  . TONSILLECTOMY    . TOTAL HIP ARTHROPLASTY Left 11/11/2015   Procedure:  HEMI ARTHROPLASTY ANTERIOR APPROACH LEFT;  Surgeon: Kathryne Hitch, MD;  Location: [facility] OR;  Service: Orthopedics;  Laterality: Left;  . TRANSTHORACIC ECHOCARDIOGRAM  07/22/2012   EF 55-60%; mild AV regurg; mild MR; mild-mod TR    FAMHx:  Family History  Problem Relation Age of Onset  . Heart disease Mother     SOCHx:   reports that  has never smoked. she has never used smokeless tobacco. She reports that she does not drink  alcohol or use drugs.  ALLERGIES:  Allergies  Allergen Reactions  . Amiodarone Other (See Comments)    Tremor, muscle weakness    ROS: Pertinent items noted in HPI and remainder of comprehensive ROS otherwise negative.  HOME MEDS: Current Outpatient Medications  Medication Sig Dispense Refill  . atorvastatin (LIPITOR) 20 MG tablet Take 20 mg by mouth daily.    . diclofenac sodium (VOLTAREN) 1 % GEL Apply 2 g topically 4 (four) times daily. 1 Tube 0  . diltiazem (CARTIA XT) 120 MG 24 hr capsule Take 1 capsule (120 mg total) by mouth daily. 90 capsule 3  . Fexofenadine HCl (ALLEGRA PO) Take 1 tablet by mouth as needed (allergies).     . Fluticasone Propionate (FLONASE NA) Place 1 spray into the nose as needed (allergies).     . furosemide (LASIX) 40 MG tablet Take 1 tablet (40 mg total) by mouth daily. 90 tablet 3  . metoprolol tartrate (LOPRESSOR) 25 MG tablet TAKE 1 TABLET TWICE DAILY (DOSE CHANGE) 180 tablet 1  . warfarin (COUMADIN) 3 MG tablet Take 3 mg by mouth daily.     No current facility-administered medications for this visit.     LABS/IMAGING: No results found for this or any previous visit (from the past 48 hour(s)). No results found.  VITALS: BP 124/69   Pulse (!) 115   Ht 5' 5.5" (1.664 m)   Wt 146 lb 3.2 oz (66.3 kg)   BMI 23.96 kg/m   EXAM: General appearance: alert and no  distress Neck: no carotid bruit and no JVD Lungs: clear to auscultation bilaterally Heart: irregularly irregular rhythm and S1, S2 normal Abdomen: soft, non-tender; bowel sounds normal; no masses,  no organomegaly Extremities: varicose veins noted Pulses: 2+ and symmetric Skin: Skin color, texture, turgor normal. No rashes or lesions Neurologic: Grossly normal Psych: Normal  EKG: A. fib with rapid ventricular response of 115, nonspecific ST changes-personally reviewed  ASSESSMENT: 1. Chronic diastolic heart failure, dyspnea 2. Longstanding persistent atrial fibrillation - with variable ventricular response 3. 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 LVEF 60-65% 5. CHADSVASC 5-on chronic warfarin 6. S/p L hip fracture (2017) 7. S/p L5 compression fracture 8. Presumed osteoporosis  PLAN: 1.   Kendra Hughes reports chronic dyspnea on exertion with minimal activity.  She has long-standing persistent from permanent atrial fibrillation.  She has been anticoagulated on warfarin.  EF is normal with mild diastolic dysfunction and moderate pulmonary hypertension.  The etiology of her shortness of breath is not clear.  She appears to be euvolemic on exam today.  It may be related to her pulmonary hypertension.  We will repeat an echocardiogram since her last study was a year and a half ago.  If there is any other clear reason for her worsening shortness of breath, we will treat based on that.  I recommended that she follow-up with her PCP and consider pulmonary function testing or further evaluation of her shortness of breath and DEXA scan.  I believe there is data in patients with hip fracture even in her age group that they would benefit from therapy to improve bone structure.  Follow-up with me afterwards.  Chrystie Nose, MD, [facility], FACP  [facility]  Medical Director of the [facility]  [facility] Attending Cardiologist   Direct Dial: [phone]  Fax: [phone]  Website:  [email or web address] 06/23/2017, 1:37 PM

## 2017-06-23 NOTE — Patient Instructions (Addendum)
Your physician has requested that you have an echocardiogram @ [address] - [address]. Echocardiography is a painless test that uses sound waves to create images of your heart. It provides your doctor with information about the size and shape of your heart and how well your heart's chambers and valves are working. This procedure takes approximately one hour. There are no restrictions for this procedure.  Your physician recommends that you schedule a follow-up appointment with Dr. Hilty after your echo.   

## 2017-07-04 ENCOUNTER — Other Ambulatory Visit (HOSPITAL_COMMUNITY): Payer: Medicare Other

## 2017-07-14 ENCOUNTER — Ambulatory Visit: Payer: Medicare Other | Admitting: Internal Medicine

## 2017-09-19 ENCOUNTER — Other Ambulatory Visit: Payer: Self-pay | Admitting: Internal Medicine

## 2017-09-22 NOTE — Telephone Encounter (Signed)
Rx has been sent to the pharmacy electronically. ° °

## 2018-01-09 ENCOUNTER — Other Ambulatory Visit: Payer: Self-pay | Admitting: Internal Medicine

## 2018-01-30 ENCOUNTER — Other Ambulatory Visit: Payer: Self-pay | Admitting: Internal Medicine

## 2018-01-30 MED ORDER — DILTIAZEM HCL ER COATED BEADS 120 MG PO CP24: 120.0000 mg | ORAL_CAPSULE | Freq: Every day | ORAL | 3 refills | Status: DC

## 2018-01-30 NOTE — Telephone Encounter (Signed)
New Message        *STAT* If patient is at the pharmacy, call can be transferred to refill team.   1. Which medications need to be refilled? (please list name of each medication and dose if known) diltiazem (CARTIA XT) 120 MG 24 hr capsule(Expired)  2. Which pharmacy/location (including street and city if local pharmacy) is medication to be sent to?[facility]  3. Do they need a 30 day or 90 day supply? 90

## 2018-01-30 NOTE — Telephone Encounter (Signed)
Rx(s) sent to pharmacy electronically.  

## 2018-02-02 ENCOUNTER — Ambulatory Visit: Payer: Medicare Other | Admitting: Internal Medicine

## 2018-03-17 ENCOUNTER — Other Ambulatory Visit: Payer: Self-pay | Admitting: Internal Medicine

## 2018-03-27 ENCOUNTER — Encounter: Payer: Self-pay | Admitting: Internal Medicine

## 2018-03-27 ENCOUNTER — Ambulatory Visit (INDEPENDENT_AMBULATORY_CARE_PROVIDER_SITE_OTHER): Payer: Medicare Other | Admitting: Internal Medicine

## 2018-03-27 VITALS — BP 136/77 | HR 74 | Ht 65.5 in | Wt 155.0 lb

## 2018-03-27 DIAGNOSIS — Z7901 Long term (current) use of anticoagulants: Secondary | ICD-10-CM | POA: Diagnosis not present

## 2018-03-27 DIAGNOSIS — I5032 Chronic diastolic (congestive) heart failure: Secondary | ICD-10-CM | POA: Diagnosis not present

## 2018-03-27 DIAGNOSIS — I10 Essential (primary) hypertension: Secondary | ICD-10-CM

## 2018-03-27 DIAGNOSIS — I481 Persistent atrial fibrillation: Secondary | ICD-10-CM

## 2018-03-27 DIAGNOSIS — I4819 Other persistent atrial fibrillation: Secondary | ICD-10-CM

## 2018-03-27 NOTE — Patient Instructions (Signed)
Your physician wants you to follow-up in: 6 months with Dr. Hilty. You will receive a reminder letter in the mail two months in advance. If you don't receive a letter, please call our office to schedule the follow-up appointment.    

## 2018-03-27 NOTE — Progress Notes (Signed)
OFFICE NOTE  Chief Complaint:  Follow-up, dyspnea  Primary Care Physician: Carmin Richmond, MD  HPI:  Kendra Hughes is an 84 year old female who I've been following for some time with a history of paroxysmal atrial fibrillation. Despite her CHADS2 score of 2, she has refused warfarin anticoagulation in the past, opting for aspirin which are documented extensively he is insufficient to protect her from stroke. She had worsening shortness of breath and apparently she and her husband tried to call the office to schedule an appointment with me but could not get an appointment.  When I saw her in the office a few months ago, she was notably breathless and can hardly finish sentences. An EKG demonstrated atrial fibrillation with rapid ventricular response at a rate of 173. She did appear somewhat dizzy and her blood pressure was low around 100 systolic.  I admitted her to the hospital and she was an inpatient for 1 week.  She was diuresed and had better rate control of her A. fib. She did undergo a TEE, which demonstrated a left atrial appendage thrombus. Based on that she could not be cardioverted and is recommended that she remain on warfarin with therapeutic INRs for at least one month.  Discharge weight was 168 and she is 166 pounds today.  She does report some mild edema which is dependent in the right leg but none in the left but does improve with elevation at night.  After her last office visit, recommended repeat TEE which demonstrated resolution of her left atrial appendage thrombus. She then underwent elective cardioversion which was successful in converting her back to sinus rhythm. After she returned today I asked her how she was feeling and she reports that she feels "great". I asked her she was feeling any palpitations or irregularity to her heart beat and she denied it. I then presented her with her EKG which demonstrates that she has gone back into atrial fibrillation, suggesting that she  is not symptomatic from A. fib and generally unaware of it. She does report that she takes her blood pressure and pulse every day and noted that after about 5 days of her heart rate being in the mid 50s it increased up into the 70s and 80s, signifying that is probably when she went back into A. Fib.  She's had marked improvement in her swelling and is now down 10 pounds. She's been wearing her lower extremity compression stockings with marked improvement. She had recent laboratory work from her primary care provider which showed an elevated triglyceride level of 253, total cholesterol 154, HDL 36 and LDL 67. It should be noted that these were nonfasting labs. There was a increase in her Lipitor to 20 mg daily.  I saw Kendra Hughes back in the office today. She reports a big improvement in her shortness of breath however she still has persistent shortness of breath with exertion. Her weight is down now over 15 pounds with diuresis and she remains euvolemic. There is no evidence of any peripheral edema. She does report of breathing is better than it had been in the past but she still says she gets short of breath with walking long distances.   Kendra Hughes seems to be doing very well. In the office today she reports stable shortness of breath. She is asking to come off some medicines if possible. She remains in A. fib with a slow ventricular response. Her INR has been therapeutic and followed by her primary. She recently  had lab work including a lipid profile. Her triglycerides were elevated in the 300s however this was nonfasting.  Kendra Hughes returns today for follow-up. She denies any worsening chest pain or worsening shortness of breath. Her weight is been fairly stable. Her swelling is stable. She is therapeutic with her INR. Blood pressure is at goal. She has persistent A. fib and his rate controlled at 97. She recently had lab work which shows excellent cholesterol control with total cholesterol 124, triglycerides 84,  HDL 50 and LDL 57. Renal function is normal with creatinine of 0.8. CBC is also normal and vitamin D was normal at 49 on replacement therapy. ALT is mildly elevated at 50.  12/06/2015  Kendra Hughes was seen back today in the office in hospital follow-up. Unfortunately she suffered a hip fracture and went into A. fib with RVR. She was placed on amiodarone which helped to slow her rate and she was considered for TEE cardioversion however due to the paroxysmal nature of this she was just treated with amiodarone. Unfortunately amiodarone caused her significant shaking and this was dose reduced with an improvement in her symptoms although she still has some shakiness. She is now however to feed herself which was difficult before due to her shaking. She remains in atrial fibrillation and 95 but is asymptomatic with this. In the past she's been cardioverted but obviously failed to hold sinus rhythm. She is now re-anticoagulated on warfarin. There was a discussion about outpatient cardioversion. A repeat echo was performed and x-ray showed preserved systolic function.  02/05/2016  I had the pleasure seeing Kendra Hughes back today for follow-up. At her last office visit I discontinued her amiodarone completely as she was having tremors and muscle soreness. The symptoms have improved significantly but not totally gone away. I did increase her Lopressor for additional rate control her heart rate seems to be fairly well controlled. She denies any worsening shortness of breath or lower extremity swelling. She reports having a therapeutic INR which is followed by her primary care provider.  04/24/2016  Kendra Hughes was seen back today in follow-up. After adjusting the medications she still has some low blood pressures at times. She does not notice any change in her dizziness. Blood pressure is stable however weight has gone up from 156-162 pounds. She does notany worsening shortness of breath or orthopnea. Heart rate is in the 80s and  occasionally gets over 100 per generally is between 60 and 100 with A. fib. EKG shows A. fib at 83 today. He also reports some persistent muscle weakness. She initially had some tremors and muscle weakness which was thought related to amiodarone, but that has long been discontinued.  09/05/2016  Kendra Hughes returns today for follow-up. She is actually here for preoperative risk assessment. She's planning on a possible lumbar spine surgery she was found to have compression fracture. She's having lumbar radiculopathy related to that. This may been related to her prior femoral neck fracture which was in May of last year. She did undergo surgery without consultation at that time. She has however had pneumonia and is generally fairly frail given her age of 35. I previous he recommended holding her warfarin for 5 days prior to surgery but cannot estimate her risk anything less than intermediate. We discussed the surgery at some more detail today and she seems to have some hesitation as she has noted a mild improvement in her back pain symptoms recently.  12/23/2016  Kendra Hughes returns for follow-up. She has successfully come  off of her narcotics for back pain. She had an L5 compression fracture and had contemplated surgery but ended up not having surgery. She underwent some back injections and reports improvement in her pain. She still has some limited mobility and requires a walker. She inquired today is whether or not she should be on some treatment for osteoporosis. She says she's not had a bone density scan before which would be recommended and certainly given her history of compression fracture and hip fracture she should benefit even at her advanced age from treatment to improve bone density. She appears euvolemic on exam today weight is within 1 pound of her prior weight. She remains in long-standing persistent if not permanent atrial fibrillation with a heart rate of around 100. She is resume chronic warfarin  therapy and is back on her statin medication. Recent LDL-C was 52.  06/23/2017  Kendra Hughes returns today for follow-up.  She notes she has had progressive dyspnea.  She has a history of chronic diastolic congestive heart failure, however weight is been stable and she is compliant on Lasix.  She is also had a DVT and is on warfarin.  She is in persistent A. fib with generally good rate control.  Heart rate was elevated today but that was taken right after walking in the office.  She says even with very minimal exertion she gets short of breath.  It is notable that her last echo in 2017 showed moderate pulmonary hypertension with normal LV function.  Since she has had 2 fractures, I recommended a DEXA scan and to consider bisphosphonate therapy.  This is not yet been performed and she told me that her primary care physician did not believe there would be much benefit for her given her age.  03/27/2018  Kendra Hughes seen today in follow-up.  Overall she seems to be doing well.  Her shortness of breath is been stable.  She denies any worsening back pain and now is on tramadol.  She is in long-standing persistent if not permanent A. fib with good rate control.  She is maintained on warfarin checked by her PCP and is been therapeutic.  She uses low-dose Lasix and has no significant edema.  She is on low-dose atorvastatin with good cholesterol control.  PMHx:  Past Medical History:  Diagnosis Date  . Arthritis    "right knee" (11/10/2015)  . Chronic diastolic (congestive) heart failure (HCC)   . DOE (dyspnea on exertion)   . DVT (deep venous thrombosis) (HCC) "years ago"  . Dyslipidemia   . Fracture of left hip (HCC) 11/09/2015   "fell"  . History of nuclear stress test 10/2009   normal exam  . Hypertension   . PAF (paroxysmal atrial fibrillation) (HCC)   . Rheumatoid arthritis (HCC) 1970   "it's in remission" (11/10/2015)    Past Surgical History:  Procedure Laterality Date  . ABDOMINAL HYSTERECTOMY  1996    . BLADDER SUSPENSION  X 2  . BREAST BIOPSY Right 1984   "benign"  . CARDIOVERSION N/A 11/01/2013   Procedure: CARDIOVERSION;  Surgeon: Chrystie Nose, MD;  Location[facility] ENDOSCOPY;  Service: Cardiovascular;  Laterality: N/A;  . CATARACT EXTRACTION W/ INTRAOCULAR LENS  IMPLANT, BILATERAL Bilateral 2003  . RECTOCELE REPAIR    . TEE WITHOUT CARDIOVERSION N/A 11/01/2013   Procedure: TRANSESOPHAGEAL ECHOCARDIOGRAM (TEE);  Surgeon: Chrystie Nose, MD;  Location: [facility] ENDOSCOPY;  Service: Cardiovascular;  Laterality: N/A;  . TONSILLECTOMY    . TOTAL HIP ARTHROPLASTY Left 11/11/2015  Procedure:  HEMI ARTHROPLASTY ANTERIOR APPROACH LEFT;  Surgeon: Kathryne Hitch, MD;  Location: [facility] OR;  Service: Orthopedics;  Laterality: Left;  . TRANSTHORACIC ECHOCARDIOGRAM  07/22/2012   EF 55-60%; mild AV regurg; mild MR; mild-mod TR    FAMHx:  Family History  Problem Relation Age of Onset  . Heart disease Mother     SOCHx:   reports that she has never smoked. She has never used smokeless tobacco. She reports that she does not drink alcohol or use drugs.  ALLERGIES:  Allergies  Allergen Reactions  . Amiodarone Other (See Comments)    Tremor, muscle weakness    ROS: Pertinent items noted in HPI and remainder of comprehensive ROS otherwise negative.  HOME MEDS: Current Outpatient Medications  Medication Sig Dispense Refill  . atorvastatin (LIPITOR) 20 MG tablet Take 20 mg by mouth daily.    [name] diltiazem (CARTIA XT) 120 MG 24 hr capsule Take 1 capsule (120 mg total) by mouth daily. 90 capsule 3  . Fexofenadine HCl (ALLEGRA PO) Take 1 tablet by mouth as needed (allergies).     . Fluticasone Propionate (FLONASE NA) Place 1 spray into the nose as needed (allergies).     . furosemide (LASIX) 40 MG tablet TAKE 1 TABLET EVERY DAY  (KEEP  APPT  FOR  REFILLS) 90 tablet 0  . metoprolol tartrate (LOPRESSOR) 25 MG tablet TAKE 1 TABLET TWICE DAILY (DOSE CHANGE) 180 tablet 2  . traMADol (ULTRAM) 50 MG  tablet Take 1 tablet by mouth 3 (three) times daily as needed.    . warfarin (COUMADIN) 3 MG tablet Take 3 mg by mouth daily.     No current facility-administered medications for this visit.     LABS/IMAGING: No results found for this or any previous visit (from the past 48 hour(s)). No results found.  VITALS: BP 136/77   Pulse 74   Ht 5' 5.5" (1.664 m)   Wt 155 lb (70.3 kg)   BMI 25.40 kg/m   EXAM: General appearance: alert and no distress Neck: no carotid bruit and no JVD Lungs: clear to auscultation bilaterally Heart: irregularly irregular rhythm and S1, S2 normal Abdomen: soft, non-tender; bowel sounds normal; no masses,  no organomegaly Extremities: varicose veins noted Pulses: 2+ and symmetric Skin: Skin color, texture, turgor normal. No rashes or lesions Neurologic: Grossly normal Psych: Normal  EKG: A. fib at 74 - personally reviewed  ASSESSMENT: 1. Chronic diastolic heart failure, dyspnea 2. Longstanding persistent atrial fibrillation - with variable ventricular response 3. 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 LVEF 60-65% 5. CHADSVASC 5-on chronic warfarin 6. S/p L hip fracture (2017) 7. S/p L5 compression fracture 8. Presumed osteoporosis  PLAN: 1.   Kendra Hughes denies any worsening shortness of breath, significant pain or other cardiac issues.  She is maintained on chronic warfarin.  Her weight is been stable.  She has chronic pain secondary to compression fracture and hip fractures and is on tramadol for this.  No changes were made her medicines today.  Follow-up with me in 6 months or sooner as necessary.  Chrystie Nose, MD, [facility], FACP  [place]  [facility]  Medical Director of the [facility] &  Cardiovascular [facility] Reduction Clinic Attending Cardiologist  Direct Dial: [phone]  Fax: [phone]  Website:  [email or web address] Nicely Hilty 03/27/2018, 1:56 PM

## 2018-04-28 ENCOUNTER — Other Ambulatory Visit: Payer: Self-pay | Admitting: Internal Medicine

## 2018-04-28 NOTE — Telephone Encounter (Signed)
Rx request sent to pharmacy.  

## 2018-05-20 ENCOUNTER — Other Ambulatory Visit: Payer: Self-pay | Admitting: Internal Medicine

## 2018-07-28 ENCOUNTER — Other Ambulatory Visit: Payer: Self-pay | Admitting: Internal Medicine

## 2018-09-30 ENCOUNTER — Telehealth: Payer: Self-pay

## 2018-09-30 NOTE — Telephone Encounter (Signed)
   Cardiac Questionnaire:    Since your last visit or hospitalization:    1. Have you been having new or worsening chest pain? No   2. Have you been having new or worsening shortness of breath? No 3. Have you been having new or worsening leg swelling, wt gain, or increase in abdominal girth (pants fitting more tightly)? No   4. Have you had any passing out spells? No        Primary Cardiologist:  Chrystie Nose, MD   Patient contacted.  History reviewed.  No symptoms to suggest any unstable cardiac conditions.  Based on discussion, with current pandemic situation, we will be postponing this appointment for Kendra Hughes with a plan for f/u in 3 months or sooner if feasible/necessary.  If symptoms change, she has been instructed to contact our office.   Routing to C19 CANCEL pool for tracking (P CV DIV CV19 CANCEL - reason for visit "other.") and assigning priority (1 = 4-6 wks, 2 = 6-12 wks, 3 = >12 wks).   Parke Poisson, RN  09/30/2018 10:04 AM         .

## 2018-10-05 ENCOUNTER — Ambulatory Visit: Payer: Medicare Other | Admitting: Internal Medicine

## 2018-11-16 ENCOUNTER — Telehealth: Payer: Self-pay

## 2018-11-16 NOTE — Telephone Encounter (Signed)
Virtual Visit Pre-Appointment Phone Call  "(Name), I am calling you today to discuss your upcoming appointment. We are currently trying to limit exposure to the virus that causes COVID-19 by seeing patients at home rather than in the office."  1. "What is the BEST phone number to call the day of the visit?" - include this in appointment notes  2. "Do you have or have access to (through a family member/friend) a smartphone with video capability that we can use for your visit?" a. If yes - list this number in appt notes as "cell" (if different from BEST phone #) and list the appointment type as a VIDEO visit in appointment notes b. If no - list the appointment type as a PHONE visit in appointment notes  3. Confirm consent - "In the setting of the current Covid19 crisis, you are scheduled for a (phone or video) visit with your provider on (date) at (time).  Just as we do with many in-office visits, in order for you to participate in this visit, we must obtain consent.  If you'd like, I can send this to your mychart (if signed up) or email for you to review.  Otherwise, I can obtain your verbal consent now.  All virtual visits are billed to your insurance company just like a normal visit would be.  By agreeing to a virtual visit, we'd like you to understand that the technology does not allow for your provider to perform an examination, and thus may limit your provider's ability to fully assess your condition. If your provider identifies any concerns that need to be evaluated in person, we will make arrangements to do so.  Finally, though the technology is pretty good, we cannot assure that it will always work on either your or our end, and in the setting of a video visit, we may have to convert it to a phone-only visit.  In either situation, we cannot ensure that we have a secure connection.  Are you willing to proceed?" STAFF: Did the patient verbally acknowledge consent to telehealth visit? Document  YES/NO here: YES  4. Advise patient to be prepared - "Two hours prior to your appointment, go ahead and check your blood pressure, pulse, oxygen saturation, and your weight (if you have the equipment to check those) and write them all down. When your visit starts, your provider will ask you for this information. If you have an Apple Watch or Kardia device, please plan to have heart rate information ready on the day of your appointment. Please have a pen and paper handy nearby the day of the visit as well."  5. Give patient instructions for MyChart download to smartphone OR [email or web address] as below if video visit (depending on what platform provider is using)  6. Inform patient they will receive a phone call 15 minutes prior to their appointment time (may be from unknown caller ID) so they should be prepared to answer    TELEPHONE CALL NOTE  Kendra Hughes has been deemed a candidate for a follow-up tele-health visit to limit community exposure during the Covid-19 pandemic. I spoke with the patient via phone to ensure availability of phone/video source, confirm preferred email & phone number, and discuss instructions and expectations.  I reminded Kendra Hughes to be prepared with any vital sign and/or heart rhythm information that could potentially be obtained via home monitoring, at the time of her visit. I reminded Kendra Hughes to expect a phone call prior to  her visit.  Ian Bushman, CMA 11/16/2018 3:51 PM   INSTRUCTIONS FOR DOWNLOADING THE MYCHART APP TO SMARTPHONE  - The patient must first make sure to have activated MyChart and know their login information - If Apple, go to [employer] and type in MyChart in the search bar and download the app. If Android, ask patient to go to [employer] and type in [place] in the search bar and download the app. The app is free but as with any other app downloads, their phone may require them to verify saved payment information or Apple/Android  password.  - The patient will need to then log into the app with their MyChart username and password, and select [facility] as their healthcare provider to link the account. When it is time for your visit, go to the MyChart app, find appointments, and click Begin Video Visit. Be sure to Select Allow for your device to access the Microphone and Camera for your visit. You will then be connected, and your provider will be with you shortly.  **If they have any issues connecting, or need assistance please contact MyChart service desk [phone])**  **If using a computer, in order to ensure the best quality for their visit they will need to use either of the following Internet Browsers: [employer] or Google Chrome**  IF USING DOXIMITY or DOXY.ME - The patient will receive a link just prior to their visit by text.     FULL LENGTH CONSENT FOR TELE-HEALTH VISIT   I hereby voluntarily request, consent and authorize [facility] and its employed or contracted physicians, physician assistants, nurse practitioners or other licensed health care professionals (the Practitioner), to provide me with telemedicine health care services (the "Services") as deemed necessary by the treating Practitioner. I acknowledge and consent to receive the Services by the Practitioner via telemedicine. I understand that the telemedicine visit will involve communicating with the Practitioner through live audiovisual communication technology and the disclosure of certain medical information by electronic transmission. I acknowledge that I have been given the opportunity to request an in-person assessment or other available alternative prior to the telemedicine visit and am voluntarily participating in the telemedicine visit.  I understand that I have the right to withhold or withdraw my consent to the use of telemedicine in the course of my care at any time, without affecting my right to future care or treatment,  and that the Practitioner or I may terminate the telemedicine visit at any time. I understand that I have the right to inspect all information obtained and/or recorded in the course of the telemedicine visit and may receive copies of available information for a reasonable fee.  I understand that some of the potential risks of receiving the Services via telemedicine include:  [name] Delay or interruption in medical evaluation due to technological equipment failure or disruption; . Information transmitted may not be sufficient (e.g. poor resolution of images) to allow for appropriate medical decision making by the Practitioner; and/or  . In rare instances, security protocols could fail, causing a breach of personal health information.  Furthermore, I acknowledge that it is my responsibility to provide information about my medical history, conditions and care that is complete and accurate to the best of my ability. I acknowledge that Practitioner's advice, recommendations, and/or decision may be based on factors not within their control, such as incomplete or inaccurate data provided by me or distortions of diagnostic images or specimens that may result from electronic transmissions. I  understand that the practice of medicine is not an exact science and that Practitioner makes no warranties or guarantees regarding treatment outcomes. I acknowledge that I will receive a copy of this consent concurrently upon execution via email to the email address I last provided but may also request a printed copy by calling the office of [facility].    I understand that my insurance will be billed for this visit.   I have read or had this consent read to me. . I understand the contents of this consent, which adequately explains the benefits and risks of the Services being provided via telemedicine.  . I have been provided ample opportunity to ask questions regarding this consent and the Services and have had my questions  answered to my satisfaction. . I give my informed consent for the services to be provided through the use of telemedicine in my medical care  By participating in this telemedicine visit I agree to the above.

## 2018-11-17 ENCOUNTER — Encounter: Payer: Self-pay | Admitting: Internal Medicine

## 2018-11-17 ENCOUNTER — Telehealth (INDEPENDENT_AMBULATORY_CARE_PROVIDER_SITE_OTHER): Payer: Medicare Other | Admitting: Internal Medicine

## 2018-11-17 VITALS — BP 109/77 | HR 86 | Ht 65.5 in | Wt 153.4 lb

## 2018-11-17 DIAGNOSIS — I10 Essential (primary) hypertension: Secondary | ICD-10-CM | POA: Diagnosis not present

## 2018-11-17 DIAGNOSIS — I4819 Other persistent atrial fibrillation: Secondary | ICD-10-CM | POA: Diagnosis not present

## 2018-11-17 DIAGNOSIS — Z7901 Long term (current) use of anticoagulants: Secondary | ICD-10-CM

## 2018-11-17 DIAGNOSIS — I5032 Chronic diastolic (congestive) heart failure: Secondary | ICD-10-CM

## 2018-11-17 NOTE — Progress Notes (Signed)
Virtual Visit via Telephone Note   This visit type was conducted due to national recommendations for restrictions regarding the COVID-19 Pandemic (e.g. social distancing) in an effort to limit this patient's exposure and mitigate transmission in our community.  Due to her co-morbid illnesses, this patient is at least at moderate risk for complications without adequate follow up.  This format is felt to be most appropriate for this patient at this time.  The patient did not have access to video technology/had technical difficulties with video requiring transitioning to audio format only (telephone).  All issues noted in this document were discussed and addressed.  No physical exam could be performed with this format.  Please refer to the patient's chart for her  consent to telehealth for [facility].   Evaluation Performed:  Telephone follow-up  Date:  11/17/2018   ID:  Kendra Hughes, DOB 22-Sep-1922, MRN [id number]  Patient Location:  [address] [place] [address]  Provider location:   [address], [address] [place], [place] [address]  PCP:  Carmin Richmond, MD  Cardiologist:  Chrystie Nose, MD Electrophysiologist:  None   Chief Complaint:  No complaints  History of Present Illness:    Kendra Hughes is a 85 y.o. female who presents via audio/video conferencing for a telehealth visit today.  Kendra Hughes was seen today in telephone follow-up.  Overall she is doing well.  She says she is rarely left the house other than to get her INR checked during the COVID-19 pandemic.  She denies any chest pain or worsening shortness of breath.  She is asymptomatic with her A. fib.  She apparently had a bad fall around Christmas and her PCP decreased her metoprolol to half tablet twice a day.  Her blood pressure may have been low and tends to run low at times.  Her blood pressure today was low normal.  There was concern about A. fib with RVR however her A. fib rate seems to be more consistently in  the 80s or 90s.  She has not had any further falls.  INR has been therapeutic.  The patient does not have symptoms concerning for COVID-19 infection (fever, chills, cough, or new SHORTNESS OF BREATH).    Prior CV studies:   The following studies were reviewed today:  Labs Chart review  PMHx:  Past Medical History:  Diagnosis Date   Arthritis    "right knee" (11/10/2015)   Chronic diastolic (congestive) heart failure (HCC)    DOE (dyspnea on exertion)    DVT (deep venous thrombosis) (HCC) "years ago"   Dyslipidemia    Fracture of left hip (HCC) 11/09/2015   "fell"   History of nuclear stress test 10/2009   normal exam   Hypertension    PAF (paroxysmal atrial fibrillation) (HCC)    Rheumatoid arthritis (HCC) 1970   "it's in remission" (11/10/2015)    Past Surgical History:  Procedure Laterality Date   ABDOMINAL HYSTERECTOMY  1996   BLADDER SUSPENSION  X 2   BREAST BIOPSY Right 1984   "benign"   CARDIOVERSION N/A 11/01/2013   Procedure: CARDIOVERSION;  Surgeon: Chrystie Nose, MD;  Location: [facility] ENDOSCOPY;  Service: Cardiovascular;  Laterality: N/A;   CATARACT EXTRACTION W/ INTRAOCULAR LENS  IMPLANT, BILATERAL Bilateral 2003   RECTOCELE REPAIR     TEE WITHOUT CARDIOVERSION N/A 11/01/2013   Procedure: TRANSESOPHAGEAL ECHOCARDIOGRAM (TEE);  Surgeon: Chrystie Nose, MD;  Location[facility] ENDOSCOPY;  Service: Cardiovascular;  Laterality:  N/A;   TONSILLECTOMY     TOTAL HIP ARTHROPLASTY Left 11/11/2015   Procedure:  HEMI ARTHROPLASTY ANTERIOR APPROACH LEFT;  Surgeon: Kathryne Hitchhristopher Y Blackman, MD;  [facility];  Service: Orthopedics;  Laterality: Left;   TRANSTHORACIC ECHOCARDIOGRAM  07/22/2012   EF 55-60%; mild AV regurg; mild MR; mild-mod TR    FAMHx:  Family History  Problem Relation Age of Onset   Heart disease Mother     SOCHx:   reports that she has never smoked. She has never used smokeless tobacco. She reports that she does not drink alcohol or use  drugs.  ALLERGIES:  Allergies  Allergen Reactions   Amiodarone Other (See Comments)    Tremor, muscle weakness    MEDS:  Current Meds  Medication Sig   atorvastatin (LIPITOR) 20 MG tablet Take 20 mg by mouth daily.   diltiazem (CARTIA XT) 120 MG 24 hr capsule Take 1 capsule (120 mg total) by mouth daily.   Fexofenadine HCl (ALLEGRA PO) Take 1 tablet by mouth as needed (allergies).    Fluticasone Propionate (FLONASE NA) Place 1 spray into the nose as needed (allergies).    furosemide (LASIX) 40 MG tablet TAKE 1 TABLET EVERY DAY  (KEEP  APPT  FOR  REFILLS)   metoprolol tartrate (LOPRESSOR) 25 MG tablet TAKE 1 TABLET TWICE DAILY (DOSE CHANGE) (Patient taking differently: Take 12.5 mg by mouth 2 (two) times daily. )   traMADol (ULTRAM) 50 MG tablet Take 1 tablet by mouth 3 (three) times daily as needed.   warfarin (COUMADIN) 3 MG tablet Take 3 mg by mouth daily.     ROS: Pertinent items noted in HPI and remainder of comprehensive ROS otherwise negative.  Labs/Other Tests and Data Reviewed:    Recent Labs: No results found for requested labs within last 8760 hours.   Recent Lipid Panel No results found for: CHOL, TRIG, HDL, CHOLHDL, LDLCALC, LDLDIRECT  Wt Readings from Last 3 Encounters:  11/17/18 153 lb 6.4 oz (69.6 kg)  03/27/18 155 lb (70.3 kg)  06/23/17 146 lb 3.2 oz (66.3 kg)     Exam:    Vital Signs:  BP 109/77    Pulse 86    Ht 5' 5.5" (1.664 m)    Wt 153 lb 6.4 oz (69.6 kg)    BMI 25.14 kg/m    Exam not performed due to telephone visit  ASSESSMENT & PLAN:    1. Chronic diastolic heart failure, dyspnea 2. Longstanding persistent atrial fibrillation - with variable ventricular response 3. 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 LVEF 60-65% 5. CHADSVASC 5-on chronic warfarin 6. S/p L hip fracture (2017) 7. S/p L5 compression fracture 8. Presumed osteoporosis  Kendra Hughes is doing very well for her age.  She gets fatigue at time which again I think  is age-related.  Her INR is been therapeutic.  She had a significant fall around Christmas however has done a little better.  Low blood pressure may have contributed to that as her beta-blocker was reduced.  She is A. fib but her rate is generally well controlled.  She denies any worsening chest pain or shortness of breath.  No changes were made to her medicines today.  COVID-19 Education: The signs and symptoms of COVID-19 were discussed with the patient and how to seek care for testing (follow up with PCP or arrange E-visit).  The importance of social distancing was discussed today.  Patient Risk:   After full review of this patients clinical status,  I feel that they are at least moderate risk at this time.  Time:   Today, I have spent 25 minutes with the patient with telehealth technology discussing, anticoagulation, falls, A. fib, hypertension.     Medication Adjustments/Labs and Tests Ordered: Current medicines are reviewed at length with the patient today.  Concerns regarding medicines are outlined above.   Tests Ordered: No orders of the defined types were placed in this encounter.   Medication Changes: No orders of the defined types were placed in this encounter.   Disposition:  in 6 month(s)  Chrystie NoseKenneth C. Jailah Willis, MD, [facility] - BFACC, FACP  [place]   [facility]  Medical Director of the Advanced Lipid Disorders &  Cardiovascular Risk Reduction Clinic Diplomate of the American Board of Clinical Lipidology Attending Cardiologist  Direct Dial: [phone]   Fax: [phone]  Website:  [email or web address]  Chrystie NoseKenneth C Nazire Fruth, MD  11/17/2018 12:24 PM

## 2018-11-17 NOTE — Patient Instructions (Signed)
Medication Instructions:  Your physician recommends that you continue on your current medications as directed. Please refer to the Current Medication list given to you today.  If you need a refill on your cardiac medications before your next appointment, please call your pharmacy.   Lab work: NONE If you have labs (blood work) drawn today and your tests are completely normal, you will receive your results only by: . MyChart Message (if you have MyChart) OR . A paper copy in the mail If you have any lab test that is abnormal or we need to change your treatment, we will call you to review the results.  Testing/Procedures: NONE  Follow-Up: At [facility], you and your health needs are our priority.  As part of our continuing mission to provide you with exceptional heart care, we have created designated Provider Care Teams.  These Care Teams include your primary Cardiologist (physician) and Advanced Practice Providers (APPs -  Physician Assistants and Nurse Practitioners) who all work together to provide you with the care you need, when you need it. You will need a follow up appointment in 6 months.  Please call our office 2 months in advance to schedule this appointment.  You may see Kenneth C Hilty, MD or one of the following Advanced Practice Providers on your designated Care Team: Hao Meng, PA-C . Angela Duke, PA-C  Any Other Special Instructions Will Be Listed Below (If Applicable).    

## 2019-01-25 ENCOUNTER — Other Ambulatory Visit: Payer: Self-pay | Admitting: Internal Medicine

## 2019-03-09 ENCOUNTER — Other Ambulatory Visit: Payer: Self-pay | Admitting: Internal Medicine

## 2019-05-07 ENCOUNTER — Other Ambulatory Visit: Payer: Self-pay | Admitting: Internal Medicine

## 2019-05-18 ENCOUNTER — Telehealth: Payer: Self-pay | Admitting: Internal Medicine

## 2019-05-18 NOTE — Telephone Encounter (Signed)
New message   Patient is calling to see if she can do a virtual visit on Friday and she states that her son will be coming with her to visit because she has to use a walker. Please advise.

## 2019-05-18 NOTE — Telephone Encounter (Signed)
Please advise if okay to do a virtual visit on Friday-

## 2019-05-18 NOTE — Telephone Encounter (Signed)
Called patient, advised it was okay- changed visit to virtual visit.

## 2019-05-18 NOTE — Telephone Encounter (Signed)
Yes .. ok to change to virtual visit.  Dr Lemmie Evens

## 2019-05-21 ENCOUNTER — Telehealth (INDEPENDENT_AMBULATORY_CARE_PROVIDER_SITE_OTHER): Payer: Medicare Other | Admitting: Internal Medicine

## 2019-05-21 ENCOUNTER — Encounter: Payer: Self-pay | Admitting: Internal Medicine

## 2019-05-21 DIAGNOSIS — I5032 Chronic diastolic (congestive) heart failure: Secondary | ICD-10-CM

## 2019-05-21 DIAGNOSIS — I4819 Other persistent atrial fibrillation: Secondary | ICD-10-CM

## 2019-05-21 DIAGNOSIS — I10 Essential (primary) hypertension: Secondary | ICD-10-CM

## 2019-05-21 DIAGNOSIS — I5033 Acute on chronic diastolic (congestive) heart failure: Secondary | ICD-10-CM

## 2019-05-21 DIAGNOSIS — E782 Mixed hyperlipidemia: Secondary | ICD-10-CM

## 2019-05-21 NOTE — Patient Instructions (Signed)
Medication Instructions:  RESUME atorvastatin 20mg  daily Continue other current medications  *If you need a refill on your cardiac medications before your next appointment, please call your pharmacy*  Lab Work: NONE If you have labs (blood work) drawn today and your tests are completely normal, you will receive your results only by: [name] MyChart Message (if you have MyChart) OR . A paper copy in the mail If you have any lab test that is abnormal or we need to change your treatment, we will call you to review the results.  Testing/Procedures: NONE  Follow-Up: At [facility], you and your health needs are our priority.  As part of our continuing mission to provide you with exceptional heart care, we have created designated Provider Care Teams.  These Care Teams include your primary Cardiologist (physician) and Advanced Practice Providers (APPs -  Physician Assistants and Nurse Practitioners) who all work together to provide you with the care you need, when you need it.  Your next appointment:   6 months  Please call in March 2021 to schedule for May 2021  The format for your next appointment:   Either In Person or Virtual  Provider:   You may see Pixie Casino, MD or one of the following Advanced Practice Providers on your designated Care Team:    Almyra Deforest, PA-C  Fabian Sharp, PA-C or   Roby Lofts, [place]   Other Instructions

## 2019-05-21 NOTE — Progress Notes (Signed)
Virtual Visit via Telephone Note   This visit type was conducted due to national recommendations for restrictions regarding the COVID-19 Pandemic (e.g. social distancing) in an effort to limit this patient's exposure and mitigate transmission in our community.  Due to her co-morbid illnesses, this patient is at least at moderate risk for complications without adequate follow up.  This format is felt to be most appropriate for this patient at this time.  The patient did not have access to video technology/had technical difficulties with video requiring transitioning to audio format only (telephone).  All issues noted in this document were discussed and addressed.  No physical exam could be performed with this format.  Please refer to the patient's chart for her  consent to telehealth for [facility].   Evaluation Performed:  Telephone follow-up  Date:  05/21/2019   ID:  Kendra Hughes, DOB October 21, 1922, MRN [id number]  Patient Location:  [address] [place] [address]  Provider location:   [address], [place], [place] [address]  PCP:  Coy Saunas, MD  Cardiologist:  Pixie Casino, MD Electrophysiologist:  None   Chief Complaint:  No complaints  History of Present Illness:    Kendra Hughes is a 85 y.o. female who presents via audio/video conferencing for a telehealth visit today.  Kendra Hughes was seen today in telephone follow-up.  Overall she is doing well.  She says she is rarely left the house other than to get her INR checked during the COVID-19 pandemic.  She denies any chest pain or worsening shortness of breath.  She is asymptomatic with her A. fib.  She apparently had a bad fall around Christmas and her PCP decreased her metoprolol to half tablet twice a day.  Her blood pressure may have been low and tends to run low at times.  Her blood pressure today was low normal.  There was concern about A. fib with RVR however her A. fib rate seems to be more consistently in  the 80s or 90s.  She has not had any further falls.  INR has been therapeutic.  05/21/2019  Kendra Hughes is seen today via telephone follow-up.  Overall she seems to be doing well.  She stopped her statin because she was concerned about how many medicine she was taking, not necessarily that she had side effects however repeat lipid showed her cholesterol went back up to 260.  I advised her to restart it.  INR has been stable on warfarin.  She denies any rapid A. fib.  She has had no further falls now for a while.  She has no chest pain or worsening shortness of breath.   The patient does not have symptoms concerning for COVID-19 infection (fever, chills, cough, or new SHORTNESS OF BREATH).    Prior CV studies:   The following studies were reviewed today:  Labs Chart review  PMHx:  Past Medical History:  Diagnosis Date   Arthritis    "right knee" (11/10/2015)   Chronic diastolic (congestive) heart failure ([place])    DOE (dyspnea on exertion)    DVT (deep venous thrombosis) ([place]) "years ago"   Dyslipidemia    Fracture of left hip ([place]) 11/09/2015   "fell"   History of nuclear stress test 10/2009   normal exam   Hypertension    PAF (paroxysmal atrial fibrillation) ([place])    Rheumatoid arthritis ([place]) 1970   "it's in remission" (11/10/2015)    Past Surgical History:  Procedure Laterality Date   ABDOMINAL HYSTERECTOMY  1996   BLADDER SUSPENSION  X 2   BREAST BIOPSY Right 1984   "benign"   CARDIOVERSION N/A 11/01/2013   Procedure: CARDIOVERSION;  Surgeon: Chrystie Nose, MD;  Location: [facility] ENDOSCOPY;  Service: Cardiovascular;  Laterality: N/A;   CATARACT EXTRACTION W/ INTRAOCULAR LENS  IMPLANT, BILATERAL Bilateral 2003   RECTOCELE REPAIR     TEE WITHOUT CARDIOVERSION N/A 11/01/2013   Procedure: TRANSESOPHAGEAL ECHOCARDIOGRAM (TEE);  Surgeon: Chrystie Nose, MD;  Location: [facility] ENDOSCOPY;  Service: Cardiovascular;  Laterality: N/A;   TONSILLECTOMY     TOTAL HIP ARTHROPLASTY  Left 11/11/2015   Procedure:  HEMI ARTHROPLASTY ANTERIOR APPROACH LEFT;  Surgeon: Kathryne Hitch, MD;  Location: [facility] OR;  Service: Orthopedics;  Laterality: Left;   TRANSTHORACIC ECHOCARDIOGRAM  07/22/2012   EF 55-60%; mild AV regurg; mild MR; mild-mod TR    FAMHx:  Family History  Problem Relation Age of Onset   Heart disease Mother     SOCHx:   reports that she has never smoked. She has never used smokeless tobacco. She reports that she does not drink alcohol or use drugs.  ALLERGIES:  Allergies  Allergen Reactions   Amiodarone Other (See Comments)    Tremor, muscle weakness    MEDS:  Current Meds  Medication Sig   diltiazem (CARDIZEM CD) 120 MG 24 hr capsule TAKE 1 CAPSULE EVERY DAY   Fexofenadine HCl (ALLEGRA PO) Take 1 tablet by mouth as needed (allergies).    Fluticasone Propionate (FLONASE NA) Place 1 spray into the nose as needed (allergies).    furosemide (LASIX) 40 MG tablet TAKE 1 TABLET EVERY DAY  (KEEP APPOINTMENT FOR REFILLS)   metoprolol tartrate (LOPRESSOR) 25 MG tablet Take 12.5 mg by mouth 2 (two) times daily.   warfarin (COUMADIN) 3 MG tablet Take 3 mg by mouth daily.     ROS: Pertinent items noted in HPI and remainder of comprehensive ROS otherwise negative.  Labs/Other Tests and Data Reviewed:    Recent Labs: No results found for requested labs within last 8760 hours.   Recent Lipid Panel No results found for: CHOL, TRIG, HDL, CHOLHDL, LDLCALC, LDLDIRECT  Wt Readings from Last 3 Encounters:  05/21/19 156 lb (70.8 kg)  11/17/18 153 lb 6.4 oz (69.6 kg)  03/27/18 155 lb (70.3 kg)     Exam:    Vital Signs:  BP (!) 102/58    Pulse 76    Ht 5\' 5"  (1.651 m)    Wt 156 lb (70.8 kg)    BMI 25.96 kg/m    Exam not performed due to telephone visit  ASSESSMENT & PLAN:    1. Chronic diastolic heart failure, dyspnea 2. Longstanding persistent atrial fibrillation - with variable ventricular response 3. History of LAA thrombus - on  anticoagulation 4. LVEF 60-65% 5. CHADSVASC 5-on chronic warfarin 6. S/p L hip fracture (2017) 7. S/p L5 compression fracture 8. Presumed osteoporosis  Kendra Hughes is doing well.  She did stop her statin but I advised her to restart it.  She denies any worsening heart failure symptoms.  She has been in A. fib and has been on chronic warfarin.  Her INRs are stable.  She has had no recent falls which were continually vigilant about.  Otherwise no changes to her medicines today.  We will plan follow-up in 6 months either virtually or in office depending on the status of Covid at that time.  COVID-19 Education: The signs and symptoms  of COVID-19 were discussed with the patient and how to seek care for testing (follow up with PCP or arrange E-visit).  The importance of social distancing was discussed today.  Patient Risk:   After full review of this patients clinical status, I feel that they are at least moderate risk at this time.  Time:   Today, I have spent 25 minutes with the patient with telehealth technology discussing, anticoagulation, falls, A. fib, hypertension.     Medication Adjustments/Labs and Tests Ordered: Current medicines are reviewed at length with the patient today.  Concerns regarding medicines are outlined above.   Tests Ordered: No orders of the defined types were placed in this encounter.   Medication Changes: No orders of the defined types were placed in this encounter.   Disposition:  in 6 month(s)  Chrystie Nose, MD, [facility], FACP  [place]   [facility]  Medical Director of the Advanced Lipid Disorders &  Cardiovascular Risk Reduction Clinic Diplomate of the American Board of Clinical Lipidology Attending Cardiologist  Direct Dial: [phone]   Fax: [phone]  Website:  [email or web address]  Chrystie Nose, MD  05/21/2019 11:25 AM

## 2019-06-15 ENCOUNTER — Other Ambulatory Visit: Payer: Self-pay | Admitting: Internal Medicine

## 2019-07-13 ENCOUNTER — Other Ambulatory Visit: Payer: Self-pay | Admitting: Internal Medicine

## 2019-07-16 ENCOUNTER — Emergency Department (HOSPITAL_COMMUNITY): Payer: Medicare Other

## 2019-07-16 ENCOUNTER — Other Ambulatory Visit: Payer: Self-pay

## 2019-07-16 ENCOUNTER — Inpatient Hospital Stay (HOSPITAL_COMMUNITY)
Admission: EM | Admit: 2019-07-16 | Discharge: 2019-07-25 | DRG: 309 | Disposition: A | Discharge status: Skilled Nursing Facility | Payer: Medicare Other | Attending: Family Medicine | Admitting: Family Medicine

## 2019-07-16 ENCOUNTER — Encounter (HOSPITAL_COMMUNITY): Payer: Self-pay

## 2019-07-16 DIAGNOSIS — Z90710 Acquired absence of both cervix and uterus: Secondary | ICD-10-CM | POA: Diagnosis not present

## 2019-07-16 DIAGNOSIS — M549 Dorsalgia, unspecified: Secondary | ICD-10-CM | POA: Diagnosis present

## 2019-07-16 DIAGNOSIS — M069 Rheumatoid arthritis, unspecified: Secondary | ICD-10-CM | POA: Diagnosis present

## 2019-07-16 DIAGNOSIS — G8929 Other chronic pain: Secondary | ICD-10-CM | POA: Diagnosis present

## 2019-07-16 DIAGNOSIS — Z7901 Long term (current) use of anticoagulants: Secondary | ICD-10-CM | POA: Diagnosis not present

## 2019-07-16 DIAGNOSIS — N390 Urinary tract infection, site not specified: Secondary | ICD-10-CM | POA: Diagnosis present

## 2019-07-16 DIAGNOSIS — I5042 Chronic combined systolic (congestive) and diastolic (congestive) heart failure: Secondary | ICD-10-CM | POA: Diagnosis present

## 2019-07-16 DIAGNOSIS — I110 Hypertensive heart disease with heart failure: Secondary | ICD-10-CM | POA: Diagnosis present

## 2019-07-16 DIAGNOSIS — E871 Hypo-osmolality and hyponatremia: Secondary | ICD-10-CM | POA: Diagnosis present

## 2019-07-16 DIAGNOSIS — I482 Chronic atrial fibrillation, unspecified: Secondary | ICD-10-CM | POA: Diagnosis not present

## 2019-07-16 DIAGNOSIS — I340 Nonrheumatic mitral (valve) insufficiency: Secondary | ICD-10-CM | POA: Diagnosis not present

## 2019-07-16 DIAGNOSIS — I959 Hypotension, unspecified: Secondary | ICD-10-CM | POA: Diagnosis present

## 2019-07-16 DIAGNOSIS — Z79899 Other long term (current) drug therapy: Secondary | ICD-10-CM | POA: Diagnosis not present

## 2019-07-16 DIAGNOSIS — Z96642 Presence of left artificial hip joint: Secondary | ICD-10-CM | POA: Diagnosis present

## 2019-07-16 DIAGNOSIS — I4891 Unspecified atrial fibrillation: Secondary | ICD-10-CM | POA: Diagnosis present

## 2019-07-16 DIAGNOSIS — I10 Essential (primary) hypertension: Secondary | ICD-10-CM | POA: Diagnosis present

## 2019-07-16 DIAGNOSIS — Z86718 Personal history of other venous thrombosis and embolism: Secondary | ICD-10-CM

## 2019-07-16 DIAGNOSIS — E876 Hypokalemia: Secondary | ICD-10-CM | POA: Diagnosis present

## 2019-07-16 DIAGNOSIS — E785 Hyperlipidemia, unspecified: Secondary | ICD-10-CM | POA: Diagnosis present

## 2019-07-16 DIAGNOSIS — Z20822 Contact with and (suspected) exposure to covid-19: Secondary | ICD-10-CM | POA: Diagnosis present

## 2019-07-16 DIAGNOSIS — B9689 Other specified bacterial agents as the cause of diseases classified elsewhere: Secondary | ICD-10-CM | POA: Diagnosis present

## 2019-07-16 DIAGNOSIS — R059 Cough, unspecified: Secondary | ICD-10-CM

## 2019-07-16 DIAGNOSIS — R05 Cough: Secondary | ICD-10-CM

## 2019-07-16 DIAGNOSIS — I361 Nonrheumatic tricuspid (valve) insufficiency: Secondary | ICD-10-CM | POA: Diagnosis not present

## 2019-07-16 DIAGNOSIS — N3000 Acute cystitis without hematuria: Secondary | ICD-10-CM | POA: Diagnosis not present

## 2019-07-16 DIAGNOSIS — I4821 Permanent atrial fibrillation: Principal | ICD-10-CM | POA: Diagnosis present

## 2019-07-16 DIAGNOSIS — R54 Age-related physical debility: Secondary | ICD-10-CM | POA: Diagnosis present

## 2019-07-16 LAB — BASIC METABOLIC PANEL
Anion gap: 12 (ref 5–15)
BUN: 25 mg/dL — ABNORMAL HIGH (ref 8–23)
CO2: 29 mmol/L (ref 22–32)
Calcium: 9.3 mg/dL (ref 8.9–10.3)
Chloride: 92 mmol/L — ABNORMAL LOW (ref 98–111)
Creatinine, Ser: 0.89 mg/dL (ref 0.44–1.00)
GFR calc Af Amer: >60 mL/min (ref >60)
GFR calc non Af Amer: 55 mL/min — ABNORMAL LOW (ref >60)
Glucose, Bld: 107 mg/dL — ABNORMAL HIGH (ref 70–99)
Potassium: 3.2 mmol/L — ABNORMAL LOW (ref 3.5–5.1)
Sodium: 133 mmol/L — ABNORMAL LOW (ref 135–145)

## 2019-07-16 LAB — URINALYSIS, ROUTINE W REFLEX MICROSCOPIC
Bilirubin Urine: NEGATIVE
Glucose, UA: NEGATIVE mg/dL
Ketones, ur: 5 mg/dL — AB
Nitrite: NEGATIVE
Protein, ur: 100 mg/dL — AB
Specific Gravity, Urine: 1.019 (ref 1.005–1.030)
WBC, UA: >50 WBC/hpf — ABNORMAL HIGH (ref 0–5)
pH: 7 (ref 5.0–8.0)

## 2019-07-16 LAB — CBC
HCT: 42.2 % (ref 36.0–46.0)
Hemoglobin: 14 g/dL (ref 12.0–15.0)
MCH: 31 pg (ref 26.0–34.0)
MCHC: 33.2 g/dL (ref 30.0–36.0)
MCV: 93.4 fL (ref 80.0–100.0)
Platelets: 296 10*3/uL (ref 150–400)
RBC: 4.52 MIL/uL (ref 3.87–5.11)
RDW: 15.1 % (ref 11.5–15.5)
WBC: 10.2 10*3/uL (ref 4.0–10.5)
nRBC: 0 % (ref 0.0–0.2)

## 2019-07-16 LAB — RESPIRATORY PANEL BY RT PCR (FLU A&B, COVID)
Influenza A by PCR: NEGATIVE
Influenza B by PCR: NEGATIVE
SARS Coronavirus 2 by RT PCR: NEGATIVE

## 2019-07-16 LAB — PROTIME-INR
INR: 2.2 — ABNORMAL HIGH (ref 0.8–1.2)
Prothrombin Time: 24.6 seconds — ABNORMAL HIGH (ref 11.4–15.2)

## 2019-07-16 MED ORDER — SODIUM CHLORIDE 0.9 % IV SOLN
1.0000 g | INTRAVENOUS | Status: DC
  Administered 2019-07-16: 23:00:00 1 g via INTRAVENOUS
  Filled 2019-07-16 (×2): qty 10

## 2019-07-16 MED ORDER — ACETAMINOPHEN 325 MG PO TABS
650.0000 mg | ORAL_TABLET | Freq: Four times a day (QID) | ORAL | Status: DC | PRN
  Administered 2019-07-16 – 2019-07-17 (×2): 650 mg via ORAL
  Filled 2019-07-16 (×2): qty 2

## 2019-07-16 MED ORDER — FUROSEMIDE 40 MG PO TABS
40.0000 mg | ORAL_TABLET | Freq: Every day | ORAL | Status: DC
  Administered 2019-07-17: 40 mg via ORAL
  Filled 2019-07-16: qty 1

## 2019-07-16 MED ORDER — WARFARIN - PHARMACIST DOSING INPATIENT
Freq: Every day | Status: DC
  Filled 2019-07-16: qty 1

## 2019-07-16 MED ORDER — ACETAMINOPHEN 650 MG RE SUPP: 650.0000 mg | Freq: Four times a day (QID) | RECTAL | Status: DC | PRN

## 2019-07-16 MED ORDER — DILTIAZEM HCL-DEXTROSE 125-5 MG/125ML-% IV SOLN (PREMIX)
5.0000 mg/h | INTRAVENOUS | Status: DC
  Administered 2019-07-16 – 2019-07-19 (×4): 5 mg/h via INTRAVENOUS
  Filled 2019-07-16 (×2): qty 125

## 2019-07-16 MED ORDER — METOPROLOL TARTRATE 25 MG PO TABS
25.0000 mg | ORAL_TABLET | Freq: Two times a day (BID) | ORAL | Status: DC
  Administered 2019-07-16 – 2019-07-17 (×3): 25 mg via ORAL
  Filled 2019-07-16 (×3): qty 1

## 2019-07-16 MED ORDER — POLYETHYL GLYCOL-PROPYL GLYCOL 0.4-0.3 % OP GEL: Freq: Every day | OPHTHALMIC | Status: DC | PRN

## 2019-07-16 MED ORDER — POTASSIUM CHLORIDE CRYS ER 20 MEQ PO TBCR
20.0000 meq | EXTENDED_RELEASE_TABLET | Freq: Once | ORAL | Status: AC
  Administered 2019-07-16: 20 meq via ORAL
  Filled 2019-07-16: qty 1

## 2019-07-16 MED ORDER — WARFARIN SODIUM 2.5 MG PO TABS
2.5000 mg | ORAL_TABLET | Freq: Every day | ORAL | Status: AC
  Administered 2019-07-16: 2.5 mg via ORAL
  Filled 2019-07-16 (×2): qty 1

## 2019-07-16 NOTE — Progress Notes (Signed)
ANTICOAGULATION CONSULT NOTE  Pharmacy Consult for warfarin Indication: atrial fibrillation    Vital Signs: Temp: 97.5 F (36.4 C) (01/08 1647) Temp Source: Oral (01/08 1647) BP: 125/73 (01/08 2145) Pulse Rate: 132 (01/08 2015)  Labs: Recent Labs    07/16/19 1717 07/16/19 1840  HGB 14.0  --   HCT 42.2  --   PLT 296  --   LABPROT  --  24.6*  INR  --  2.2*  CREATININE 0.89  --     CrCl cannot be calculated (Unknown ideal weight.).   Medical History: Past Medical History:  Diagnosis Date  . Arthritis    "right knee" (11/10/2015)  . Chronic diastolic (congestive) heart failure (HCC)   . DOE (dyspnea on exertion)   . DVT (deep venous thrombosis) (HCC) "years ago"  . Dyslipidemia   . Fracture of left hip (HCC) 11/09/2015   "fell"  . History of nuclear stress test 10/2009   normal exam  . Hypertension   . PAF (paroxysmal atrial fibrillation) (HCC)   . Rheumatoid arthritis (HCC) 1970   "it's in remission" (11/10/2015)     Assessment: 86 yo female on warfarin prior to admission for hx of AFib with RVR.  Current INR 2.2 therapeutic.  PTA warfarin: 2.5 mg po daily  Goal of Therapy:  INR 2-3 Monitor platelets by anticoagulation protocol: Yes   Plan:  -Warfarin 2.5 mg daily -Daily INR   [place] Friday 07/16/2019,10:03 PM

## 2019-07-16 NOTE — H&P (Signed)
TRH H&P    Patient Demographics:    Kendra Hughes, is a 86 y.o. female  MRN: [id number]  DOB - 1922/11/16  Admit Date - 07/16/2019  Referring MD/NP/PA:  Pilar Plate  Outpatient Primary MD for the patient is Carmin Richmond, MD [place] Hilty - primary cardiologist  Patient coming from:  home  Chief complaint- Afib with RVR   HPI:    Kendra Hughes  is a 86 y.o. female,  w rheumatoid arthritis, hypertension, hyperlipidemia, CHF (diastolic), hx of LAA thrombus,  Pafib (Chadsvasc 5, h/o remote DVT, s/p L hip fracture 2017, s/p L5 compression fracture, apparently presents with fatigue and dyspnea for the past 2-4 weeks. Pt was noted to have Afib with RVR yesterday and seen in ER in [facility] and given iv metoprolol w some improvement and sent home.  Apparently seen by PCP this morning and had Afib w RVR and sent to [facility] ER for evaluation.   Pt denies any recent medication changes,  Pt notes about 1 year ago had metoprolol reduced.  Pt took her metoprolol and cardizem this morning.  Pt denies any excessive caffeine use.  Pt denies fever, chills, cough, cp, palp, n/v, diarrhea, brbpr, dysuria, hematuria.   In ED,  T 97.5, P 133, R 16, Bp 138/95 pox 97% on RA  CXR IMPRESSION: Chronic changes without acute abnormality.  Na 133, K 3.2, Bun 25, Creatinne 0.89 Wbc 10.2, Hgb 14.0, Plt 296 INR 2.2 Urinalysis Wbc >50, rbc 6-10  Ekg Afib at 130, nl axis, st depression in 3, avf, v4-6  ED spoke with Dr. Diona Browner who per ED recommended cardizem GTT and admission.   Pt will be admitted for AFib with RVR and acute lower UTI    Review of systems:    In addition to the HPI above,  No Fever-chills, No Headache, No changes with Vision or hearing, No problems swallowing food or Liquids, No Chest pain, No Cough  , No orthopnea, No pnd No Abdominal pain, No Nausea or Vomiting, bowel movements are regular, No Blood in stool or  Urine, No dysuria, No new skin rashes or bruises, No new joints pains-aches,  No new weakness, tingling, numbness in any extremity, No recent weight gain or loss, No polyuria, polydypsia or polyphagia, No significant Mental Stressors.  All other systems reviewed and are negative.    Past History of the following :    Past Medical History:  Diagnosis Date  . Arthritis    "right knee" (11/10/2015)  . Chronic diastolic (congestive) heart failure (HCC)   . DOE (dyspnea on exertion)   . DVT (deep venous thrombosis) (HCC) "years ago"  . Dyslipidemia   . Fracture of left hip (HCC) 11/09/2015   "fell"  . History of nuclear stress test 10/2009   normal exam  . Hypertension   . PAF (paroxysmal atrial fibrillation) (HCC)   . Rheumatoid arthritis (HCC) 1970   "it's in remission" (11/10/2015)      Past Surgical History:  Procedure Laterality Date  . ABDOMINAL HYSTERECTOMY  1996  .  BLADDER SUSPENSION  X 2  . BREAST BIOPSY Right 1984   "benign"  . CARDIOVERSION N/A 11/01/2013   Procedure: CARDIOVERSION;  Surgeon: Pixie Casino, MD;  Location: [facility] ENDOSCOPY;  Service: Cardiovascular;  Laterality: N/A;  . CATARACT EXTRACTION W/ INTRAOCULAR LENS  IMPLANT, BILATERAL Bilateral 2003  . RECTOCELE REPAIR    . TEE WITHOUT CARDIOVERSION N/A 11/01/2013   Procedure: TRANSESOPHAGEAL ECHOCARDIOGRAM (TEE);  Surgeon: Pixie Casino, MD;  Location: [facility] ENDOSCOPY;  Service: Cardiovascular;  Laterality: N/A;  . TONSILLECTOMY    . TOTAL HIP ARTHROPLASTY Left 11/11/2015   Procedure:  HEMI ARTHROPLASTY ANTERIOR APPROACH LEFT;  Surgeon: Mcarthur Rossetti, MD;  Location: [place];  Service: Orthopedics;  Laterality: Left;  . TRANSTHORACIC ECHOCARDIOGRAM  07/22/2012   EF 55-60%; mild AV regurg; mild MR; mild-mod TR      Social History:      Social History   Tobacco Use  . Smoking status: Never Smoker  . Smokeless tobacco: Never Used  Substance Use Topics  . Alcohol use: No       Family History :      Family History  Problem Relation Age of Onset  . Heart disease Mother        Home Medications:   Prior to Admission medications   Medication Sig Start Date End Date Taking? Authorizing Provider  acetaminophen (TYLENOL) 650 MG CR tablet Take 650-1,300 mg by mouth every 8 (eight) hours as needed for pain (headache).   Yes [provider]  diltiazem (CARDIZEM CD) 120 MG 24 hr capsule TAKE 1 CAPSULE EVERY DAY Patient taking differently: Take 120 mg by mouth daily.  01/25/19  Yes Hilty, Nadean Corwin, MD  furosemide (LASIX) 40 MG tablet TAKE 1 TABLET EVERY DAY  (KEEP APPOINTMENT FOR REFILLS) Patient taking differently: Take 40 mg by mouth daily.  07/15/19  Yes Hilty, Nadean Corwin, MD  metoprolol tartrate (LOPRESSOR) 25 MG tablet TAKE 1 TABLET TWICE DAILY (DOSE CHANGE) Patient taking differently: Take 25 mg by mouth 2 (two) times daily.  06/15/19  Yes Hilty, Nadean Corwin, MD  Polyethyl Glycol-Propyl Glycol (SYSTANE OP) Place 1 drop into both eyes daily as needed (dry eyes).   Yes [provider]  Vitamin D, Ergocalciferol, (DRISDOL) 1.25 MG (50000 UT) CAPS capsule Take 50,000 Units by mouth every Monday. 07/12/19  Yes [provider]  warfarin (COUMADIN) 1 MG tablet Take 0.5 mg by mouth every evening. Take with 2 mg tablet for a total dose of 2 1/2 mg every evening   Yes [provider]  warfarin (COUMADIN) 2 MG tablet Take 2 mg by mouth every evening. Take with 1/2 1 mg tablet for a total dose of 2 1/2 mg every evening 02/26/16  Yes [provider]     Allergies:     Allergies  Allergen Reactions  . Amiodarone Other (See Comments)    Tremor, muscle weakness     Physical Exam:   Vitals  Blood pressure 120/80, pulse (!) 132, temperature (!) 97.5 F (36.4 C), temperature source Oral, resp. rate 13, SpO2 95 %.  1.  General: axoxo3  2. Psychiatric: euthymic  3. Neurologic: Nonfocal,  cn2-12 intact, reflexes 2+ symmetric, diffuse with no clonus,  motor 5/5 in all 4 ext  4. HEENMT:  Anicteric, pupils 1.71mm symmetric, direct, consensual, near intact Neck: no jvd  5. Respiratory : CTAB  6. Cardiovascular : rrr s1, s2,   7. Gastrointestinal:  Abd: soft, nt, nd, +bs  8. Skin:  Ext: no c/c/e, no rash  9.Musculoskeletal:  Good ROM    Data Review:    CBC Recent Labs  Lab 07/16/19 1717  WBC 10.2  HGB 14.0  HCT 42.2  PLT 296  MCV 93.4  MCH 31.0  MCHC 33.2  RDW 15.1   ------------------------------------------------------------------------------------------------------------------  Results for orders placed or performed during the hospital encounter of 07/16/19 (from the past 48 hour(s))  Basic metabolic panel     Status: Abnormal   Collection Time: 07/16/19  5:17 PM  Result Value Ref Range   Sodium 133 (L) 135 - 145 mmol/L   Potassium 3.2 (L) 3.5 - 5.1 mmol/L   Chloride 92 (L) 98 - 111 mmol/L   CO2 29 22 - 32 mmol/L   Glucose, Bld 107 (H) 70 - 99 mg/dL   BUN 25 (H) 8 - 23 mg/dL   Creatinine, Ser 9.14 0.44 - 1.00 mg/dL   Calcium 9.3 8.9 - 78.2 mg/dL   GFR calc non Af Amer 55 (L) >60 mL/min   GFR calc Af Amer >60 >60 mL/min   Anion gap 12 5 - 15    Comment: Performed at [facility], [address], [place], [place] [address]  CBC     Status: None   Collection Time: 07/16/19  5:17 PM  Result Value Ref Range   WBC 10.2 4.0 - 10.5 K/uL   RBC 4.52 3.87 - 5.11 MIL/uL   Hemoglobin 14.0 12.0 - 15.0 g/dL   HCT 30.8 65.7 - 84.6 %   MCV 93.4 80.0 - 100.0 fL   MCH 31.0 26.0 - 34.0 pg   MCHC 33.2 30.0 - 36.0 g/dL   RDW 96.2 95.2 - 84.1 %   Platelets 296 150 - 400 K/uL   nRBC 0.0 0.0 - 0.2 %    Comment: Performed at [facility], [address] [place], [place] [address]  PT     Status: Abnormal   Collection Time: 07/16/19  6:40 PM  Result Value Ref Range   Prothrombin Time 24.6 (H) 11.4 - 15.2 seconds   INR 2.2 (H) 0.8 - 1.2    Comment: (NOTE) INR goal varies based on device and disease  states. Performed at [facility], [address] [place], [place] [address]   Urinalysis     Status: Abnormal   Collection Time: 07/16/19  8:22 PM  Result Value Ref Range   Color, Urine YELLOW YELLOW   APPearance CLOUDY (A) CLEAR   Specific Gravity, Urine 1.019 1.005 - 1.030   pH 7.0 5.0 - 8.0   Glucose, UA NEGATIVE NEGATIVE mg/dL   Hgb urine dipstick SMALL (A) NEGATIVE   Bilirubin Urine NEGATIVE NEGATIVE   Ketones, ur 5 (A) NEGATIVE mg/dL   Protein, ur 536 (A) NEGATIVE mg/dL   Nitrite NEGATIVE NEGATIVE   Leukocytes,Ua LARGE (A) NEGATIVE   RBC / HPF 6-10 0 - 5 RBC/hpf   WBC, UA >50 (H) 0 - 5 WBC/hpf   Bacteria, UA FEW (A) NONE SEEN   Squamous Epithelial / LPF 0-5 0 - 5    Comment: Performed at [facility], [address] [place], [place] [address]    Chemistries  Recent Labs  Lab 07/16/19 1717  NA 133*  K 3.2*  CL 92*  CO2 29  GLUCOSE 107*  BUN 25*  CREATININE 0.89  CALCIUM 9.3   ------------------------------------------------------------------------------------------------------------------  ------------------------------------------------------------------------------------------------------------------ GFR: CrCl cannot be calculated (Unknown ideal weight.). Liver Function Tests: No results  for input(s): AST, ALT, ALKPHOS, BILITOT, PROT, ALBUMIN in the last 168 hours. No results for input(s): LIPASE, AMYLASE in the last 168 hours. No results for input(s): AMMONIA in the last 168 hours. Coagulation Profile: Recent Labs  Lab 07/16/19 1840  INR 2.2*   Cardiac Enzymes: No results for input(s): CKTOTAL, CKMB, CKMBINDEX, TROPONINI in the last 168 hours. BNP (last 3 results) No results for input(s): PROBNP in the last 8760 hours. HbA1C: No results for input(s): HGBA1C in the last 72 hours. CBG: No results for input(s): GLUCAP in the last 168 hours. Lipid Profile: No results for input(s): CHOL, HDL, LDLCALC, TRIG, CHOLHDL, LDLDIRECT in the last  72 hours. Thyroid Function Tests: No results for input(s): TSH, T4TOTAL, FREET4, T3FREE, THYROIDAB in the last 72 hours. Anemia Panel: No results for input(s): VITAMINB12, FOLATE, FERRITIN, TIBC, IRON, RETICCTPCT in the last 72 hours.  --------------------------------------------------------------------------------------------------------------- Urine analysis:    Component Value Date/Time   COLORURINE YELLOW 07/16/2019 2022   APPEARANCEUR CLOUDY (A) 07/16/2019 2022   LABSPEC 1.019 07/16/2019 2022   PHURINE 7.0 07/16/2019 2022   GLUCOSEU NEGATIVE 07/16/2019 2022   HGBUR SMALL (A) 07/16/2019 2022   BILIRUBINUR NEGATIVE 07/16/2019 2022   KETONESUR 5 (A) 07/16/2019 2022   PROTEINUR 100 (A) 07/16/2019 2022   UROBILINOGEN 0.2 08/20/2013 1003   NITRITE NEGATIVE 07/16/2019 2022   LEUKOCYTESUR LARGE (A) 07/16/2019 2022      Imaging Results:    DG Chest 2 View  Result Date: 07/16/2019 CLINICAL DATA:  Atrial fibrillation EXAM: CHEST - 2 VIEW COMPARISON:  07/15/2019 FINDINGS: Cardiac shadow is mildly enlarged. Aortic calcifications are again seen. Patient is rotated to the left accentuating the mediastinal markings. No focal infiltrate or sizable effusion is seen. No acute bony abnormality is noted. Chronic compression deformity in the lower thoracic spine is seen. IMPRESSION: Chronic changes without acute abnormality. Electronically Signed   By: Alcide Clever M.D.   On: 07/16/2019 17:54       Assessment & Plan:    Principal Problem:   Atrial fibrillation with RVR (HCC) Active Problems:   HTN (hypertension)   Hyperlipidemia   Hyponatremia   Hypokalemia  Afib with RVR Tele Trop I q2h x2 Check tsh Check cardiac echo Cont Metoprolol 25mg  po bid Hold oral cardizem-> cardizem GTT Coumadin pharmacy to dose Unclear if cardiology officially consulted by ED, Please consult cardiology in AM, appreciate input  Hypokalemia Replete Check magnesium Check cmp in am  Acute lower  UTI Urine culture Rocephin 1gm iv qday  Hyponatremia Check cmp in am  H/o CHF (diastolic) Cont Lasix 40mg  po qday    DVT Prophylaxis-   Coumadin pharmacy to dose  AM Labs Ordered, also please review Full Orders  Family Communication: Admission, patients condition and plan of care including tests being ordered have been discussed with the patient  who indicate understanding and agree with the plan and Code Status.  Code Status:  FULL CODE per patient, notified husband that patient admitted to [facility]  Admission status: Inpatient: Based on patients clinical presentation and evaluation of above clinical data, I have made determination that patient meets Inpatient criteria at this time.    Time spent in minutes : critical care time 60 minutes   M.D on 07/16/2019 at 9:14 PM

## 2019-07-16 NOTE — ED Provider Notes (Signed)
[facility] Emergency Department Provider Note MRN:  [id number]  Arrival date & time: 07/16/19     Chief Complaint   Atrial Fibrillation   History of Present Illness   Kendra Hughes is a 86 y.o. year-old female with a history of DVT, A. fib presenting to the ED with chief complaint of A. fib.  Patient reports general malaise for several days.  Was at a [facility] yesterday for A. fib with RVR.  Was discharged home but the A. fib came back.  She denies fever, no cough, some mild burning with urination.  She denies chest pain, no shortness of breath, no abdominal pain.  Symptoms are mild, constant, no exacerbating or alleviating factors.  Review of Systems  A complete 10 system review of systems was obtained and all systems are negative except as noted in the HPI and PMH.   Patient's Health History    Past Medical History:  Diagnosis Date  . Arthritis    "right knee" (11/10/2015)  . Chronic diastolic (congestive) heart failure ([place])   . DOE (dyspnea on exertion)   . DVT (deep venous thrombosis) ([place]) "years ago"  . Dyslipidemia   . Fracture of left hip ([place]) 11/09/2015   "fell"  . History of nuclear stress test 10/2009   normal exam  . Hypertension   . PAF (paroxysmal atrial fibrillation) ([place])   . Rheumatoid arthritis ([place]) 1970   "it's in remission" (11/10/2015)    Past Surgical History:  Procedure Laterality Date  . ABDOMINAL HYSTERECTOMY  1996  . BLADDER SUSPENSION  X 2  . BREAST BIOPSY Right 1984   "benign"  . CARDIOVERSION N/A 11/01/2013   Procedure: CARDIOVERSION;  Surgeon: Pixie Casino, MD;  Location: [facility] ENDOSCOPY;  Service: Cardiovascular;  Laterality: N/A;  . CATARACT EXTRACTION W/ INTRAOCULAR LENS  IMPLANT, BILATERAL Bilateral 2003  . RECTOCELE REPAIR    . TEE WITHOUT CARDIOVERSION N/A 11/01/2013   Procedure: TRANSESOPHAGEAL ECHOCARDIOGRAM (TEE);  Surgeon: Pixie Casino, MD;  Location: [facility] ENDOSCOPY;  Service: Cardiovascular;   Laterality: N/A;  . TONSILLECTOMY    . TOTAL HIP ARTHROPLASTY Left 11/11/2015   Procedure:  HEMI ARTHROPLASTY ANTERIOR APPROACH LEFT;  Surgeon: Mcarthur Rossetti, MD;  Location: [place];  Service: Orthopedics;  Laterality: Left;  . TRANSTHORACIC ECHOCARDIOGRAM  07/22/2012   EF 55-60%; mild AV regurg; mild MR; mild-mod TR    Family History  Problem Relation Age of Onset  . Heart disease Mother     Social History   Socioeconomic History  . Marital status: Married    Spouse name: Not on file  . Number of children: 2  . Years of education: Not on file  . Highest education level: Not on file  Occupational History  . Not on file  Tobacco Use  . Smoking status: Never Smoker  . Smokeless tobacco: Never Used  Substance and Sexual Activity  . Alcohol use: No  . Drug use: No  . Sexual activity: Never  Other Topics Concern  . Not on file  Social History Narrative  . Not on file   Social Determinants of Health   Financial Resource Strain:   . Difficulty of Paying Living Expenses: Not on file  Food Insecurity:   . Worried About Charity fundraiser in the Last Year: Not on file  . Ran Out of Food in the Last Year: Not on file  Transportation Needs:   . Lack of Transportation (Medical): Not on file  .  Lack of Transportation (Non-Medical): Not on file  Physical Activity:   . Days of Exercise per Week: Not on file  . Minutes of Exercise per Session: Not on file  Stress:   . Feeling of Stress : Not on file  Social Connections:   . Frequency of Communication with Friends and Family: Not on file  . Frequency of Social Gatherings with Friends and Family: Not on file  . Attends Religious Services: Not on file  . Active Member of Clubs or Organizations: Not on file  . Attends Banker Meetings: Not on file  . Marital Status: Not on file  Intimate Partner Violence:   . Fear of Current or Ex-Partner: Not on file  . Emotionally Abused: Not on file  . Physically Abused: Not  on file  . Sexually Abused: Not on file     Physical Exam  Vital Signs and Nursing Notes reviewed Vitals:   07/16/19 1915 07/16/19 1930  BP: (!) 162/136 (!) 124/110  Pulse:    Resp: (!) 21 18  Temp:    SpO2:      CONSTITUTIONAL: Well-appearing, NAD NEURO:  Alert and oriented x 3, no focal deficits EYES:  eyes equal and reactive ENT/NECK:  no LAD, no JVD CARDIO: Tachycardic rate, well-perfused, normal S1 and S2 PULM:  CTAB no wheezing or rhonchi GI/GU:  normal bowel sounds, non-distended, non-tender MSK/SPINE:  No gross deformities, no edema SKIN:  no rash, atraumatic PSYCH:  Appropriate speech and behavior  Diagnostic and Interventional Summary    EKG Interpretation  Date/Time:  Friday July 16 2019 16:42:11 EST Ventricular Rate:  133 PR Interval:    QRS Duration: 82 QT Interval:  268 QTC Calculation: 398 R Axis:   31 Text Interpretation: Atrial fibrillation with rapid ventricular response with premature ventricular or aberrantly conducted complexes Low voltage QRS ST & T wave abnormality, consider inferior ischemia ST & T wave abnormality, consider anterior ischemia Abnormal ECG Confirmed by Kennis Carina [phone]) on 07/16/2019 6:37:19 PM      Labs Reviewed  BASIC METABOLIC PANEL - Abnormal; Notable for the following components:      Result Value   Sodium 133 (*)    Potassium 3.2 (*)    Chloride 92 (*)    Glucose, Bld 107 (*)    BUN 25 (*)    GFR calc non Af Amer 55 (*)    All other components within normal limits  PROTIME-INR - Abnormal; Notable for the following components:   Prothrombin Time 24.6 (*)    INR 2.2 (*)    All other components within normal limits  RESPIRATORY PANEL BY RT PCR (FLU A&B, COVID)  CBC  URINALYSIS, ROUTINE W REFLEX MICROSCOPIC    DG Chest 2 View  Final Result      Medications  diltiazem (CARDIZEM) 125 mg in dextrose 5% 125 mL (1 mg/mL) infusion (5 mg/hr Intravenous New Bag/Given 07/16/19 1928)     Procedures  /  Critical  Care .Critical Care Performed by: Sabas Sous, MD Authorized by: Sabas Sous, MD   Critical care provider statement:    Critical care time (minutes):  45   Critical care was necessary to treat or prevent imminent or life-threatening deterioration of the following conditions: Atrial fibrillation with rapid ventricular response.   Critical care was time spent personally by me on the following activities:  Discussions with consultants, evaluation of patient's response to treatment, examination of patient, ordering and performing treatments and interventions, ordering and  review of laboratory studies, ordering and review of radiographic studies, pulse oximetry, re-evaluation of patient's condition, obtaining history from patient or surrogate and review of old charts    ED Course and Medical Decision Making  I have reviewed the triage vital signs and the nursing notes.  Pertinent labs & imaging results that were available during my care of the patient were reviewed by me and considered in my medical decision making (see below for details).     A. fib with RVR, history of the same, takes Coumadin, looks like she was properly anticoagulated yesterday at [facility] system.  Will place on dill drip given her rates here up to 150 persistently.  Will likely consult cardiology to discuss appropriateness of cardioversion.  She is not a perfect candidate given the duration of her symptoms, but she is anticoagulated.  7:15 PM update: Discussed case with Dr. Diona Browner of cardiology.  Would not recommend cardioversion given her history of persistent A. fib and the timing of patient's symptoms.  Plan is to attempt rate control in the emergency department and attempt discharge with increase of patient's diltiazem dosing from 120 to 180 mg.  However if having trouble controlling the rates, admission to hospital service would be appropriate.  Elmer Sow. Pilar Plate, MD [facility] Emergency Medicine [facility] [email or web address]  Final Clinical Impressions(s) / ED Diagnoses     ICD-10-CM   1. Atrial fibrillation with RVR (HCC)  I48.91   2. A-fib (HCC)  I48.91 CANCELED: XR Chest Single View    CANCELED: XR Chest Single View    ED Discharge Orders    None       Discharge Instructions Discussed with and Provided to Patient:   Discharge Instructions   None       Sabas Sous, MD 07/16/19 2010

## 2019-07-16 NOTE — ED Triage Notes (Addendum)
Pt sent by PCP for evaluation of atrial fibrillation with RVR. Rate ranging from 90-176. Pt given 10 mg cardizem en route with no change Pt has hx of a fib and was seen at [facility] last week for the same and was given metoprolol. Pt c.o generalized weakness for the past week. Pt arrives alert, ambulatory. BP 145/86

## 2019-07-16 NOTE — ED Notes (Signed)
Paged Dr. Selena Batten in regards to pt medication.

## 2019-07-16 NOTE — ED Notes (Signed)
Called pt husband Thurmond Butts to give him an update per pt's request.

## 2019-07-17 ENCOUNTER — Inpatient Hospital Stay (HOSPITAL_COMMUNITY): Payer: Medicare Other

## 2019-07-17 DIAGNOSIS — E871 Hypo-osmolality and hyponatremia: Secondary | ICD-10-CM

## 2019-07-17 DIAGNOSIS — I340 Nonrheumatic mitral (valve) insufficiency: Secondary | ICD-10-CM

## 2019-07-17 DIAGNOSIS — I10 Essential (primary) hypertension: Secondary | ICD-10-CM

## 2019-07-17 DIAGNOSIS — I482 Chronic atrial fibrillation, unspecified: Secondary | ICD-10-CM

## 2019-07-17 DIAGNOSIS — I5042 Chronic combined systolic (congestive) and diastolic (congestive) heart failure: Secondary | ICD-10-CM

## 2019-07-17 DIAGNOSIS — N3000 Acute cystitis without hematuria: Secondary | ICD-10-CM

## 2019-07-17 DIAGNOSIS — I361 Nonrheumatic tricuspid (valve) insufficiency: Secondary | ICD-10-CM

## 2019-07-17 DIAGNOSIS — E785 Hyperlipidemia, unspecified: Secondary | ICD-10-CM

## 2019-07-17 LAB — ECHOCARDIOGRAM COMPLETE
Height: 63 in
Weight: 2335.99 oz

## 2019-07-17 LAB — MAGNESIUM: Magnesium: 2.2 mg/dL (ref 1.7–2.4)

## 2019-07-17 LAB — COMPREHENSIVE METABOLIC PANEL
ALT: 19 U/L (ref 0–44)
AST: 22 U/L (ref 15–41)
Albumin: 3.4 g/dL — ABNORMAL LOW (ref 3.5–5.0)
Alkaline Phosphatase: 92 U/L (ref 38–126)
Anion gap: 11 (ref 5–15)
BUN: 20 mg/dL (ref 8–23)
CO2: 31 mmol/L (ref 22–32)
Calcium: 9 mg/dL (ref 8.9–10.3)
Chloride: 95 mmol/L — ABNORMAL LOW (ref 98–111)
Creatinine, Ser: 0.85 mg/dL (ref 0.44–1.00)
GFR calc Af Amer: >60 mL/min (ref >60)
GFR calc non Af Amer: 59 mL/min — ABNORMAL LOW (ref >60)
Glucose, Bld: 116 mg/dL — ABNORMAL HIGH (ref 70–99)
Potassium: 3.6 mmol/L (ref 3.5–5.1)
Sodium: 137 mmol/L (ref 135–145)
Total Bilirubin: 0.9 mg/dL (ref 0.3–1.2)
Total Protein: 6.7 g/dL (ref 6.5–8.1)

## 2019-07-17 LAB — CBC
HCT: 38.9 % (ref 36.0–46.0)
Hemoglobin: 13 g/dL (ref 12.0–15.0)
MCH: 31.3 pg (ref 26.0–34.0)
MCHC: 33.4 g/dL (ref 30.0–36.0)
MCV: 93.7 fL (ref 80.0–100.0)
Platelets: 277 10*3/uL (ref 150–400)
RBC: 4.15 MIL/uL (ref 3.87–5.11)
RDW: 15 % (ref 11.5–15.5)
WBC: 9.3 10*3/uL (ref 4.0–10.5)
nRBC: 0 % (ref 0.0–0.2)

## 2019-07-17 LAB — PROTIME-INR
INR: 2.2 — ABNORMAL HIGH (ref 0.8–1.2)
Prothrombin Time: 24.4 seconds — ABNORMAL HIGH (ref 11.4–15.2)

## 2019-07-17 LAB — TROPONIN I (HIGH SENSITIVITY)
Troponin I (High Sensitivity): 18 ng/L — ABNORMAL HIGH (ref <18)
Troponin I (High Sensitivity): 19 ng/L — ABNORMAL HIGH (ref <18)

## 2019-07-17 LAB — TSH: TSH: 1.578 u[IU]/mL (ref 0.350–4.500)

## 2019-07-17 MED ORDER — CEFDINIR 300 MG PO CAPS
300.0000 mg | ORAL_CAPSULE | Freq: Two times a day (BID) | ORAL | Status: DC
  Administered 2019-07-17 – 2019-07-18 (×4): 300 mg via ORAL
  Filled 2019-07-17 (×5): qty 1

## 2019-07-17 MED ORDER — TRAMADOL HCL 50 MG PO TABS
100.0000 mg | ORAL_TABLET | Freq: Four times a day (QID) | ORAL | Status: DC | PRN
  Administered 2019-07-17 – 2019-07-24 (×11): 100 mg via ORAL
  Filled 2019-07-17 (×11): qty 2

## 2019-07-17 MED ORDER — ONDANSETRON HCL 4 MG/2ML IJ SOLN
4.0000 mg | Freq: Four times a day (QID) | INTRAMUSCULAR | Status: DC | PRN
  Filled 2019-07-17: qty 2

## 2019-07-17 NOTE — Progress Notes (Signed)
  Echocardiogram 2D Echocardiogram has been performed.  Kendra Hughes Kendra Hughes 07/17/2019, 2:25 PM

## 2019-07-17 NOTE — Progress Notes (Signed)
  Echocardiogram 2D Echocardiogram has been attempted. HR 133-141. Will reattempt Echo at later time.  Dalaney Needle G Maxwell Lemen 07/17/2019, 8:14 AM

## 2019-07-17 NOTE — Progress Notes (Signed)
Dr. Ronaldo Miyamoto updated with pt complaint of lower back pain unresolved wit tylenol 650 mg  p..o . New order received for Tramadol 100 mg Q6H PRN for moderate to severe pain.

## 2019-07-17 NOTE — Progress Notes (Signed)
Kendra Hughes  PROGRESS NOTE    DABNEY Hughes  QUI:[id number] DOB: 1923-03-18 DOA: 07/16/2019 PCP: Carmin Richmond, MD   Brief Narrative:   Kendra Hughes  is a 86 y.o. female,  w rheumatoid arthritis, hypertension, hyperlipidemia, CHF (diastolic), hx of LAA thrombus,  Pafib (Chadsvasc 5, h/o remote DVT, s/p L hip fracture 2017, s/p L5 compression fracture, apparently presents with fatigue and dyspnea for the past 2-4 weeks. Pt was noted to have Afib with RVR yesterday and seen in ER in [facility] and given iv metoprolol w some improvement and sent home.  Apparently seen by PCP this morning and had Afib w RVR and sent to [facility] ER for evaluation.   07/17/19: On dilt gtt. Still ranging 100's to 140's. Reports general fatigue.    Assessment & Plan:   Principal Problem:   Atrial fibrillation with RVR (HCC) Active Problems:   HTN (hypertension)   Hyperlipidemia   Hyponatremia   Hypokalemia  Afib with RVR     - on dilt gtt; continuing home metoprolol     - followed by cards; has had difficulty with amiodarone and dig in the past     - cards consulted, spoke with Dr. Antoine Poche, appreciate assistance     - echo ordered     - mildly elevated trp; denies CP     - pharm dosing coumadin  Hypokalemia     - resolved (3.6); continue K+ supplementation     - Mg2+ ok  Acute lower UTI (citrobacter freundi)     - UCx positive for citrobacter     - currently on rocephin; reports burning with urination, will continue abx for now, but will change to cefdinir  Hyponatremia     - resolved  chronic diastolic HF     - continue home lasix; fluid status ok, monitor  DVT prophylaxis: coumadin Code Status: FULL Family Communication: None at bedside   Disposition Plan: TBD  Consultants:   Cardiology  Antimicrobials:  . cefdinir   ROS:  Denies CP, N, V, ab pain. Reports burning with urination. Remainder 10-pt ROS is negative for all not previously mentioned.  Subjective: "Oh... I've had it for a  while."  Objective: Vitals:   07/17/19 0422 07/17/19 0500 07/17/19 0731 07/17/19 0846  BP: (!) 105/54 116/60 130/78 (!) 123/98  Pulse: 71 (!) 101 (!) 132 (!) 129  Resp: 20 20 20    Temp: 98.9 F (37.2 C)  98.7 F (37.1 C)   TempSrc: Oral  Oral   SpO2: 95% 100% 100%   Weight: 66.2 kg     Height:        Intake/Output Summary (Last 24 hours) at 07/17/2019 1442 Last data filed at 07/17/2019 0900 Gross per 24 hour  Intake 183.93 ml  Output 600 ml  Net -416.07 ml   Filed Weights   07/16/19 2249 07/17/19 0422  Weight: 66.2 kg 66.2 kg    Examination:  General: 86 y.o. female resting in bed in NAD Cardiovascular: tachy, irregular, +S1, S2, no m/g/rt Respiratory: CTABL, no w/r/r, normal WOB GI: BS+, NDNT, no masses noted, no organomegaly noted MSK: No e/c/c Neuro: Alert to name, follows commands Psyc: Appropriate interaction and affect, calm/cooperative   Data Reviewed: I have personally reviewed following labs and imaging studies.  CBC: Recent Labs  Lab 07/16/19 1717 07/17/19 0106  WBC 10.2 9.3  HGB 14.0 13.0  HCT 42.2 38.9  MCV 93.4 93.7  PLT 296 277   Basic Metabolic Panel: Recent Labs  Lab 07/16/19 1717 07/17/19 0106  NA 133* 137  K 3.2* 3.6  CL 92* 95*  CO2 29 31  GLUCOSE 107* 116*  BUN 25* 20  CREATININE 0.89 0.85  CALCIUM 9.3 9.0  MG  --  2.2   GFR: Estimated Creatinine Clearance: 37 mL/min (by C-G formula based on SCr of 0.85 mg/dL). Liver Function Tests: Recent Labs  Lab 07/17/19 0106  AST 22  ALT 19  ALKPHOS 92  BILITOT 0.9  PROT 6.7  ALBUMIN 3.4*   No results for input(s): LIPASE, AMYLASE in the last 168 hours. No results for input(s): AMMONIA in the last 168 hours. Coagulation Profile: Recent Labs  Lab 07/16/19 1840 07/17/19 0106  INR 2.2* 2.2*   Cardiac Enzymes: No results for input(s): CKTOTAL, CKMB, CKMBINDEX, TROPONINI in the last 168 hours. BNP (last 3 results) No results for input(s): PROBNP in the last 8760  hours. HbA1C: No results for input(s): HGBA1C in the last 72 hours. CBG: No results for input(s): GLUCAP in the last 168 hours. Lipid Profile: No results for input(s): CHOL, HDL, LDLCALC, TRIG, CHOLHDL, LDLDIRECT in the last 72 hours. Thyroid Function Tests: Recent Labs    07/17/19 0106  TSH 1.578   Anemia Panel: No results for input(s): VITAMINB12, FOLATE, FERRITIN, TIBC, IRON, RETICCTPCT in the last 72 hours. Sepsis Labs: No results for input(s): PROCALCITON, LATICACIDVEN in the last 168 hours.  Recent Results (from the past 240 hour(s))  Respiratory Panel by RT PCR (Flu A&B, Covid) -     Status: None   Collection Time: 07/16/19  8:27 PM  Result Value Ref Range Status   SARS Coronavirus 2 by RT PCR NEGATIVE NEGATIVE Final    Comment: (NOTE) SARS-CoV-2 target nucleic acids are NOT DETECTED. The SARS-CoV-2 RNA is generally detectable in upper respiratoy specimens during the acute phase of infection. The lowest concentration of SARS-CoV-2 viral copies this assay can detect is 131 copies/mL. A negative result does not preclude SARS-Cov-2 infection and should not be used as the sole basis for treatment or other patient management decisions. A negative result may occur with  improper specimen collection/handling, submission of specimen other than nasopharyngeal swab, presence of viral mutation(s) within the areas targeted by this assay, and inadequate number of viral copies (<131 copies/mL). A negative result must be combined with clinical observations, patient history, and epidemiological information. The expected result is Negative. Fact Sheet for Patients:  PinkCheek.be Fact Sheet for Healthcare Providers:  GravelBags.it This test is not yet ap proved or cleared by the [place] FDA and  has been authorized for detection and/or diagnosis of SARS-CoV-2 by FDA under an Emergency Use Authorization (EUA). This EUA will  remain  in effect (meaning this test can be used) for the duration of the COVID-19 declaration under Section 564(b)(1) of the Act, 21 U.S.C. section 360bbb-3(b)(1), unless the authorization is terminated or revoked sooner.    Influenza A by PCR NEGATIVE NEGATIVE Final   Influenza B by PCR NEGATIVE NEGATIVE Final    Comment: (NOTE) The Xpert Xpress SARS-CoV-2/FLU/RSV assay is intended as an aid in  the diagnosis of influenza from Nasopharyngeal swab specimens and  should not be used as a sole basis for treatment. Nasal washings and  aspirates are unacceptable for Xpert Xpress SARS-CoV-2/FLU/RSV  testing. Fact Sheet for Patients: PinkCheek.be Fact Sheet for Healthcare Providers: GravelBags.it This test is not yet approved or cleared by the [place] FDA and  has been authorized for detection and/or diagnosis of SARS-CoV-2 by  FDA under an Emergency Use Authorization (EUA). This EUA will remain  in effect (meaning this test can be used) for the duration of the  Covid-19 declaration under Section 564(b)(1) of the Act, 21  U.S.C. section 360bbb-3(b)(1), unless the authorization is  terminated or revoked. Performed at [facility], [address], [place], [place] [address]       Radiology Studies: DG Chest 2 View  Result Date: 07/16/2019 CLINICAL DATA:  Atrial fibrillation EXAM: CHEST - 2 VIEW COMPARISON:  07/15/2019 FINDINGS: Cardiac shadow is mildly enlarged. Aortic calcifications are again seen. Patient is rotated to the left accentuating the mediastinal markings. No focal infiltrate or sizable effusion is seen. No acute bony abnormality is noted. Chronic compression deformity in the lower thoracic spine is seen. IMPRESSION: Chronic changes without acute abnormality. Electronically Signed   By: Alcide Clever M.D.   On: 07/16/2019 17:54   ECHOCARDIOGRAM COMPLETE  Result Date: 07/17/2019   ECHOCARDIOGRAM REPORT    Patient Name:   Kendra Hughes Date of Exam: 07/17/2019 Medical Rec #:  [id number]    Height:       63.0 in Accession #:    [id number]   Weight:       146.0 lb Date of Birth:  Mar 21, 1923    BSA:          1.69 m Patient Age:    89 years     BP:           123/98 mmHg Patient Gender: F            HR:           129 bpm. Exam Location:  Inpatient Procedure: 2D Echo Indications:    Atrial Fibrillation 427.31 / I48.91  History:        Patient has prior history of Echocardiogram examinations, most                 recent 11/15/2015. Arrythmias:Tachycardia,                 Signs/Symptoms:Hypotension and Shortness of Breath; Risk                 Factors:Hypertension and Dyslipidemia.  Sonographer:    Leeroy Bock Turrentine Referring Phys: [id number] JAMES KIM IMPRESSIONS  1. Left ventricular ejection fraction, by visual estimation, is 50 to 55%. The left ventricle has normal function. There is no left ventricular hypertrophy.  2. Left ventricular diastolic function could not be evaluated.  3. The left ventricle has no regional wall motion abnormalities.  4. Global right ventricle has normal systolic function.The right ventricular size is mildly enlarged.  5. Left atrial size was mildly dilated.  6. Right atrial size was severely dilated.  7. The mitral valve is normal in structure. Mild mitral valve regurgitation. No evidence of mitral stenosis.  8. The tricuspid valve is normal in structure.  9. The aortic valve is tricuspid. Aortic valve regurgitation is not visualized. No evidence of aortic valve sclerosis or stenosis. 10. The pulmonic valve was normal in structure. Pulmonic valve regurgitation is not visualized. 11. Mildly elevated pulmonary artery systolic pressure. 12. The inferior vena cava is normal in size with greater than 50% respiratory variability, suggesting right atrial pressure of 3 mmHg. 13. Normal LV systolic function; biatrial enlargement; mild RVE; calcified aortic valve with mild AS (mean gradient 11 mmHg; visually  appears moderate); mild MR; moderate TR. FINDINGS  Left Ventricle: Left ventricular ejection fraction, by visual estimation, is 50 to  55%. The left ventricle has normal function. The left ventricle has no regional wall motion abnormalities. There is no left ventricular hypertrophy. The left ventricular diastology could not be evaluated due to atrial fibrillation. Left ventricular diastolic function could not be evaluated. Normal left atrial pressure. Right Ventricle: The right ventricular size is mildly enlarged.Global RV systolic function is has normal systolic function. The tricuspid regurgitant velocity is 2.92 m/s, and with an assumed right atrial pressure of 3 mmHg, the estimated right ventricular systolic pressure is mildly elevated at 37.1 mmHg. Left Atrium: Left atrial size was mildly dilated. Right Atrium: Right atrial size was severely dilated Pericardium: There is no evidence of pericardial effusion. Mitral Valve: The mitral valve is normal in structure. Mild mitral valve regurgitation. No evidence of mitral valve stenosis by observation. Tricuspid Valve: The tricuspid valve is normal in structure. Tricuspid valve regurgitation moderate. Aortic Valve: The aortic valve is tricuspid. Aortic valve regurgitation is not visualized. The aortic valve is structurally normal, with no evidence of sclerosis or stenosis. Aortic valve mean gradient measures 11.2 mmHg. Aortic valve peak gradient measures 18.9 mmHg. Aortic valve area, by VTI measures 0.89 cm. Pulmonic Valve: The pulmonic valve was normal in structure. Pulmonic valve regurgitation is not visualized. Pulmonic regurgitation is not visualized. Aorta: The aortic root is normal in size and structure. Venous: The inferior vena cava is normal in size with greater than 50% respiratory variability, suggesting right atrial pressure of 3 mmHg. IAS/Shunts: No atrial level shunt detected by color flow Doppler. Additional Comments: Normal LV systolic function;  biatrial enlargement; mild RVE; calcified aortic valve with mild AS (mean gradient 11 mmHg; visually appears moderate); mild MR; moderate TR.  LEFT VENTRICLE PLAX 2D LVIDd:         3.40 cm LVIDs:         2.90 cm LV PW:         0.90 cm LV IVS:        0.90 cm LVOT diam:     1.80 cm LV SV:         15 ml LV SV Index:   8.80 LVOT Area:     2.54 cm  RIGHT VENTRICLE RV S prime:     8.05 cm/s TAPSE (M-mode): 1.2 cm LEFT ATRIUM             Index       RIGHT ATRIUM           Index LA diam:        4.70 cm 2.78 cm/m  RA Area:     23.90 cm LA Vol (A2C):   46.5 ml 27.49 ml/m RA Volume:   72.40 ml  42.80 ml/m LA Vol (A4C):   52.8 ml 31.21 ml/m LA Biplane Vol: 49.6 ml 29.32 ml/m  AORTIC VALVE AV Area (Vmax):    0.89 cm AV Area (Vmean):   0.86 cm AV Area (VTI):     0.89 cm AV Vmax:           217.40 cm/s AV Vmean:          157.000 cm/s AV VTI:            0.373 m AV Peak Grad:      18.9 mmHg AV Mean Grad:      11.2 mmHg LVOT Vmax:         76.13 cm/s LVOT Vmean:        53.100 cm/s LVOT VTI:  0.130 m LVOT/AV VTI ratio: 0.35  AORTA Ao Root diam: 2.70 cm TRICUSPID VALVE TR Peak grad:   34.1 mmHg TR Vmax:        292.00 cm/s  SHUNTS Systemic VTI:  0.13 m Systemic Diam: 1.80 cm  Olga Millers MD Electronically signed by Olga Millers MD Signature Date/Time: 07/17/2019/2:34:47 PM    Final      Scheduled Meds: . furosemide  40 mg Oral Daily  . metoprolol tartrate  25 mg Oral BID  . Warfarin - Pharmacist Dosing Inpatient   Does not apply q1800   Continuous Infusions: . cefTRIAXone (ROCEPHIN)  IV 1 g (07/16/19 2317)  . diltiazem (CARDIZEM) infusion Stopped (07/17/19 0053)     LOS: 1 day    Time spent: 25 minutes spent in the coordination of care today.    Teddy Spike, DO [facility]  If 7PM-7AM, please contact night-coverage [email or web address] 07/17/2019, 2:42 PM

## 2019-07-17 NOTE — Consult Note (Signed)
CARDIOLOGY CONSULT NOTE  Patient ID: Kendra Hughes MRN: [id number] DOB/AGE: [birth year] 84 y.o.  Admit date: 07/16/2019 Primary Physician Coy Saunas, MD Primary Cardiologist Pixie Casino, MD Chief Complaint  Back pain and weakness Requesting  Dr. Cherylann Ratel  HPI:   86 y.o. female who is followed for atrial fib and is treated with warfarin.    She was sent to ED because she was noted to be in atrial fib with RVR.  She has been treated with IV Dilt and we are consulted.  She has a non significant flat hs Trop.  She is being treated for a UTI.   The course has been complicated because she has not tolerated amiodarone.  It is reported that it caused shaking.  She has not tolerated digoxin either.  I did not see what her reaction to that medication was.  She has had hypotension and her beta blocker dose was reduced last year.    I see records from [facility] and she had an echo this month with an EF of 40 - 45%.   She has moderate AS.     She has had progressive weakness over about 3 months.  She has chronic back pain which has been not controlled.  She denies any palpitations, presyncope or syncope.  However, she has been noted by her primary care to have atrial for with a rapid rate.  She has been having some increasing shortness of breath though she is not describing cough fevers or chills.  She has not had PND or orthopnea.  She has not had any falls.  She has had some symptoms consistent with a urinary infection.   Past Medical History:  Diagnosis Date  . Arthritis    "right knee" (11/10/2015)  . Chronic diastolic (congestive) heart failure ([place])   . DOE (dyspnea on exertion)   . DVT (deep venous thrombosis) ([place]) "years ago"  . Dyslipidemia   . Fracture of left hip ([place]) 11/09/2015   "fell"  . History of nuclear stress test 10/2009   normal exam  . Hypertension   . PAF (paroxysmal atrial fibrillation) ([place])   . Rheumatoid arthritis ([place]) 1970   "it's in remission" (11/10/2015)    Past  Surgical History:  Procedure Laterality Date  . ABDOMINAL HYSTERECTOMY  1996  . BLADDER SUSPENSION  X 2  . BREAST BIOPSY Right 1984   "benign"  . CARDIOVERSION N/A 11/01/2013   Procedure: CARDIOVERSION;  Surgeon: Pixie Casino, MD;  Location: [facility] ENDOSCOPY;  Service: Cardiovascular;  Laterality: N/A;  . CATARACT EXTRACTION W/ INTRAOCULAR LENS  IMPLANT, BILATERAL Bilateral 2003  . RECTOCELE REPAIR    . TEE WITHOUT CARDIOVERSION N/A 11/01/2013   Procedure: TRANSESOPHAGEAL ECHOCARDIOGRAM (TEE);  Surgeon: Pixie Casino, MD;  Location: [facility] ENDOSCOPY;  Service: Cardiovascular;  Laterality: N/A;  . TONSILLECTOMY    . TOTAL HIP ARTHROPLASTY Left 11/11/2015   Procedure:  HEMI ARTHROPLASTY ANTERIOR APPROACH LEFT;  Surgeon: Mcarthur Rossetti, MD;  Location: [place];  Service: Orthopedics;  Laterality: Left;  . TRANSTHORACIC ECHOCARDIOGRAM  07/22/2012   EF 55-60%; mild AV regurg; mild MR; mild-mod TR    Allergies  Allergen Reactions  . Amiodarone Other (See Comments)    Tremor, muscle weakness   Medications Prior to Admission  Medication Sig Dispense Refill Last Dose  . acetaminophen (TYLENOL) 650 MG CR tablet Take 650-1,300 mg by mouth every 8 (eight) hours as needed for pain (headache).   07/16/2019 at am  .  diltiazem (CARDIZEM CD) 120 MG 24 hr capsule TAKE 1 CAPSULE EVERY DAY (Patient taking differently: Take 120 mg by mouth daily. ) 90 capsule 3 07/16/2019 at am  . furosemide (LASIX) 40 MG tablet TAKE 1 TABLET EVERY DAY  (KEEP APPOINTMENT FOR REFILLS) (Patient taking differently: Take 40 mg by mouth daily. ) 90 tablet 1 07/11/2019  . metoprolol tartrate (LOPRESSOR) 25 MG tablet TAKE 1 TABLET TWICE DAILY (DOSE CHANGE) (Patient taking differently: Take 25 mg by mouth 2 (two) times daily. ) 180 tablet 3 07/16/2019 at 730  . Polyethyl Glycol-Propyl Glycol (SYSTANE OP) Place 1 drop into both eyes daily as needed (dry eyes).   Past Week at Unknown time  . Vitamin D, Ergocalciferol, (DRISDOL) 1.25 MG (50000  UT) CAPS capsule Take 50,000 Units by mouth every Monday.   07/12/2019  . warfarin (COUMADIN) 1 MG tablet Take 0.5 mg by mouth every evening. Take with 2 mg tablet for a total dose of 2 1/2 mg every evening   07/15/2019 at 1830-1900  . warfarin (COUMADIN) 2 MG tablet Take 2 mg by mouth every evening. Take with 1/2 1 mg tablet for a total dose of 2 1/2 mg every evening   07/15/2019 at 1830-1900   Family History  Problem Relation Age of Onset  . Heart disease Mother     Social History   Socioeconomic History  . Marital status: Married    Spouse name: Not on file  . Number of children: 2  . Years of education: Not on file  . Highest education level: Not on file  Occupational History  . Not on file  Tobacco Use  . Smoking status: Never Smoker  . Smokeless tobacco: Never Used  Substance and Sexual Activity  . Alcohol use: No  . Drug use: No  . Sexual activity: Never  Other Topics Concern  . Not on file  Social History Narrative  . Not on file   Social Determinants of Health   Financial Resource Strain:   . Difficulty of Paying Living Expenses: Not on file  Food Insecurity:   . Worried About Programme researcher, broadcasting/film/video in the Last Year: Not on file  . Ran Out of Food in the Last Year: Not on file  Transportation Needs:   . Lack of Transportation (Medical): Not on file  . Lack of Transportation (Non-Medical): Not on file  Physical Activity:   . Days of Exercise per Week: Not on file  . Minutes of Exercise per Session: Not on file  Stress:   . Feeling of Stress : Not on file  Social Connections:   . Frequency of Communication with Friends and Family: Not on file  . Frequency of Social Gatherings with Friends and Family: Not on file  . Attends Religious Services: Not on file  . Active Member of Clubs or Organizations: Not on file  . Attends Banker Meetings: Not on file  . Marital Status: Not on file  Intimate Partner Violence:   . Fear of Current or Ex-Partner: Not on file   . Emotionally Abused: Not on file  . Physically Abused: Not on file  . Sexually Abused: Not on file     ROS:  Positive for back pain, weakness.  Otherwise as stated in the HPI and negative for all other systems.  Physical Exam: Blood pressure (!) 123/98, pulse (!) 129, temperature 98.7 F (37.1 C), temperature source Oral, resp. rate 20, height 5\' 3"  (1.6 m), weight 66.2 kg, SpO2  100 %.  GENERAL:  Frail appearing and uncomfortable with back pain HEENT:  Pupils equal round and reactive, fundi not visualized, oral mucosa unremarkable NECK:  No jugular venous distention, waveform within normal limits, carotid upstroke brisk and symmetric, no bruits, no thyromegaly LYMPHATICS:  No cervical, inguinal adenopathy LUNGS:  Clear to auscultation bilaterally BACK:  No CVA tenderness CHEST:  Unremarkable HEART:  PMI not displaced or sustained,S1 and S2 within normal limits, no S3, irregular no clicks, no rubs, no murmurs ABD:  Flat, positive bowel sounds normal in frequency in pitch, no bruits, no rebound, no guarding, no midline pulsatile mass, no hepatomegaly, no splenomegaly EXT:  2 plus pulses throughout, no edema, no cyanosis no clubbing SKIN:  No rashes no nodules NEURO:  Cranial nerves II through XII grossly intact, motor grossly intact throughout PSYCH:  Cognitively intact, oriented to person place and time  Labs: Lab Results  Component Value Date   BUN 20 07/17/2019   Lab Results  Component Value Date   CREATININE 0.85 07/17/2019   Lab Results  Component Value Date   NA 137 07/17/2019   K 3.6 07/17/2019   CL 95 (L) 07/17/2019   CO2 31 07/17/2019   Lab Results  Component Value Date   TROPONINI 0.05 (H) 11/13/2015   Lab Results  Component Value Date   WBC 9.3 07/17/2019   HGB 13.0 07/17/2019   HCT 38.9 07/17/2019   MCV 93.7 07/17/2019   PLT 277 07/17/2019   No results found for: CHOL, HDL, LDLCALC, LDLDIRECT, TRIG, CHOLHDL Lab Results  Component Value Date   ALT 19  07/17/2019   AST 22 07/17/2019   ALKPHOS 92 07/17/2019   BILITOT 0.9 07/17/2019    Radiology:   CXR: Cardiac shadow is mildly enlarged. Aortic calcifications are again seen. Patient is rotated to the left accentuating the mediastinal markings. No focal infiltrate or sizable effusion is seen. No acute bony abnormality is noted. Chronic compression deformity in the lower thoracic spine is seen.  EKG:   Atrial fib with RVR and incomplete RBBB, PVCs.  No acute ST T wave changes.   ASSESSMENT AND PLAN:   CHRONIC SYSTOLIC AND DIASTOLIC HF:    The patient seems to be euvolemic.  I am going to hold the Lasix for now with her hypotension.  Follow exam daily for med adjustment.    Will try to continue beta blocker as BP allows but will hold for SBP less than 90.  She does have the mildly reduced ejection fraction but she is not a candidate for any invasive evaluation and medical options are limited at this point.  CHRONIC ATRIAL FIB:   Kendra Hughes has a CHA2DS2 - VASc score of 5.   Rate will be difficult to control given her intolerance to medications.  And also be very difficult to control with her pain.  We have reached out to the primary service to ask him to consider further pain management.  For now I will continue the Cardizem IV but this cannot be titrated.  Hopefully as her pain is managed for additional UTI she will have improvement overall her situation but her options are limited.   SignedRollene Rotunda 07/17/2019, 3:07 PM

## 2019-07-17 NOTE — Progress Notes (Signed)
Pt with Afib; on Cardizem gtt. HR bouncing from 60's to 70's; BP=95/62. NP Bodenheimer informed. Stopped Cardizem as odered. Will resume Cardizem if HR=120's & up as per NP Bodenheimer. Will continue to monitor pt.

## 2019-07-17 NOTE — Progress Notes (Signed)
ANTICOAGULATION CONSULT NOTE  Pharmacy Consult for warfarin Indication: atrial fibrillation    Vital Signs: Temp: 98.7 F (37.1 C) (01/09 0731) Temp Source: Oral (01/09 0731) BP: 123/98 (01/09 0846) Pulse Rate: 129 (01/09 0846)  Labs: Recent Labs    07/16/19 1717 07/16/19 1840 07/16/19 2314 07/17/19 0106  HGB 14.0  --   --  13.0  HCT 42.2  --   --  38.9  PLT 296  --   --  277  LABPROT  --  24.6*  --  24.4*  INR  --  2.2*  --  2.2*  CREATININE 0.89  --   --  0.85  TROPONINIHS  --   --  18* 19*    Estimated Creatinine Clearance: 37 mL/min (by C-G formula based on SCr of 0.85 mg/dL).   Medical History: Past Medical History:  Diagnosis Date  . Arthritis    "right knee" (11/10/2015)  . Chronic diastolic (congestive) heart failure (HCC)   . DOE (dyspnea on exertion)   . DVT (deep venous thrombosis) (HCC) "years ago"  . Dyslipidemia   . Fracture of left hip (HCC) 11/09/2015   "fell"  . History of nuclear stress test 10/2009   normal exam  . Hypertension   . PAF (paroxysmal atrial fibrillation) (HCC)   . Rheumatoid arthritis (HCC) 1970   "it's in remission" (11/10/2015)     Assessment: 86 yo female on warfarin prior to admission for hx of AFib with RVR.  INR remains therapeutic today at 2.2  PTA warfarin: 2.5 mg po daily  Goal of Therapy:  INR 2-3 Monitor platelets by anticoagulation protocol: Yes   Plan:  -Warfarin 2.5 mg daily -Daily INR     Tamas Suen L. Arlester Marker, PharmD [facility]AL PGY1 Pharmacy Resident [phone] @TODAY @      9:48 AM  Please check AMION for all [facility] Pharmacy phone numbers After 10:00 PM, call the Main Pharmacy [phone]

## 2019-07-18 DIAGNOSIS — I4891 Unspecified atrial fibrillation: Secondary | ICD-10-CM

## 2019-07-18 LAB — CBC WITH DIFFERENTIAL/PLATELET
Abs Immature Granulocytes: 0.03 10*3/uL (ref 0.00–0.07)
Basophils Absolute: 0 10*3/uL (ref 0.0–0.1)
Basophils Relative: 0 %
Eosinophils Absolute: 0.1 10*3/uL (ref 0.0–0.5)
Eosinophils Relative: 1 %
HCT: 39.1 % (ref 36.0–46.0)
Hemoglobin: 12.9 g/dL (ref 12.0–15.0)
Immature Granulocytes: 0 %
Lymphocytes Relative: 11 %
Lymphs Abs: 1 10*3/uL (ref 0.7–4.0)
MCH: 30.5 pg (ref 26.0–34.0)
MCHC: 33 g/dL (ref 30.0–36.0)
MCV: 92.4 fL (ref 80.0–100.0)
Monocytes Absolute: 0.7 10*3/uL (ref 0.1–1.0)
Monocytes Relative: 7 %
Neutro Abs: 7.3 10*3/uL (ref 1.7–7.7)
Neutrophils Relative %: 81 %
Platelets: 268 10*3/uL (ref 150–400)
RBC: 4.23 MIL/uL (ref 3.87–5.11)
RDW: 15.2 % (ref 11.5–15.5)
WBC: 9.1 10*3/uL (ref 4.0–10.5)
nRBC: 0 % (ref 0.0–0.2)

## 2019-07-18 LAB — RENAL FUNCTION PANEL
Albumin: 3.2 g/dL — ABNORMAL LOW (ref 3.5–5.0)
Anion gap: 9 (ref 5–15)
BUN: 23 mg/dL (ref 8–23)
CO2: 30 mmol/L (ref 22–32)
Calcium: 9.1 mg/dL (ref 8.9–10.3)
Chloride: 97 mmol/L — ABNORMAL LOW (ref 98–111)
Creatinine, Ser: 0.86 mg/dL (ref 0.44–1.00)
GFR calc Af Amer: >60 mL/min (ref >60)
GFR calc non Af Amer: 58 mL/min — ABNORMAL LOW (ref >60)
Glucose, Bld: 112 mg/dL — ABNORMAL HIGH (ref 70–99)
Phosphorus: 3.6 mg/dL (ref 2.5–4.6)
Potassium: 3.8 mmol/L (ref 3.5–5.1)
Sodium: 136 mmol/L (ref 135–145)

## 2019-07-18 LAB — PROTIME-INR
INR: 2.3 — ABNORMAL HIGH (ref 0.8–1.2)
Prothrombin Time: 25.1 seconds — ABNORMAL HIGH (ref 11.4–15.2)

## 2019-07-18 LAB — MAGNESIUM: Magnesium: 2.1 mg/dL (ref 1.7–2.4)

## 2019-07-18 MED ORDER — METOPROLOL TARTRATE 25 MG PO TABS
37.5000 mg | ORAL_TABLET | Freq: Two times a day (BID) | ORAL | Status: DC
  Administered 2019-07-18 – 2019-07-21 (×6): 37.5 mg via ORAL
  Filled 2019-07-18 (×9): qty 1

## 2019-07-18 MED ORDER — WARFARIN SODIUM 2.5 MG PO TABS
2.5000 mg | ORAL_TABLET | Freq: Once | ORAL | Status: AC
  Administered 2019-07-18: 2.5 mg via ORAL
  Filled 2019-07-18: qty 1

## 2019-07-18 NOTE — Progress Notes (Signed)
I noticed that patients blood pressure on the monitor was in the 70s systolically. I went to recheck it and patients bp was 82/64. She reports some general fatigue which she stated has been going on. I turned patients IV diltizem off as bp is lower than parameters. Patients heart rate still in 110s to 120s. I paged and spoke with Karoline Caldwell, PA with cardiology. Apparently MD should be rounding on patient soon and we will address on rounds.

## 2019-07-18 NOTE — Progress Notes (Signed)
Progress Note  Patient Name: Kendra Hughes Date of Encounter: 07/18/2019  Primary Cardiologist: Pixie Casino, MD   Subjective   No chest pain or sob.   Inpatient Medications    Scheduled Meds: . cefdinir  300 mg Oral Q12H  . metoprolol tartrate  25 mg Oral BID  . Warfarin - Pharmacist Dosing Inpatient   Does not apply q1800   Continuous Infusions: . diltiazem (CARDIZEM) infusion 5 mg/hr (07/18/19 0217)   PRN Meds: acetaminophen **OR** acetaminophen, ondansetron (ZOFRAN) IV, Polyethyl Glycol-Propyl Glycol, traMADol   Vital Signs    Vitals:   07/18/19 0138 07/18/19 0436 07/18/19 0538 07/18/19 0830  BP: 100/70 (!) 105/57 114/61   Pulse: 89 80 60   Resp: 19 (!) 22 (!) 23   Temp:    (!) 97.5 F (36.4 C)  TempSrc:    Oral  SpO2: 95% 96% 96%   Weight:  68.7 kg    Height:        Intake/Output Summary (Last 24 hours) at 07/18/2019 0900 Last data filed at 07/18/2019 0831 Gross per 24 hour  Intake 360 ml  Output 500 ml  Net -140 ml   Filed Weights   07/16/19 2249 07/17/19 0422 07/18/19 0436  Weight: 66.2 kg 66.2 kg 68.7 kg    Telemetry    Atrial fib with a RVR/CVR - Personally Reviewed  ECG    Atrial fib with a RVR - Personally Reviewed  Physical Exam   GEN: No acute distress.   Neck: No JVD Cardiac: IRIR tachy, no murmurs, rubs, or gallops.  Respiratory: Clear to auscultation bilaterally. GI: Soft, nontender, non-distended  MS: No edema; No deformity. Neuro:  Nonfocal  Psych: Normal affect   Labs    Chemistry Recent Labs  Lab 07/16/19 1717 07/17/19 0106 07/18/19 0119  NA 133* 137 136  K 3.2* 3.6 3.8  CL 92* 95* 97*  CO2 29 31 30   GLUCOSE 107* 116* 112*  BUN 25* 20 23  CREATININE 0.89 0.85 0.86  CALCIUM 9.3 9.0 9.1  PROT  --  6.7  --   ALBUMIN  --  3.4* 3.2*  AST  --  22  --   ALT  --  19  --   ALKPHOS  --  92  --   BILITOT  --  0.9  --   GFRNONAA 55* 59* 58*  GFRAA >60 >60 >60  ANIONGAP 12 11 9      Hematology Recent Labs  Lab  07/16/19 1717 07/17/19 0106 07/18/19 0119  WBC 10.2 9.3 9.1  RBC 4.52 4.15 4.23  HGB 14.0 13.0 12.9  HCT 42.2 38.9 39.1  MCV 93.4 93.7 92.4  MCH 31.0 31.3 30.5  MCHC 33.2 33.4 33.0  RDW 15.1 15.0 15.2  PLT 296 277 268    Cardiac EnzymesNo results for input(s): TROPONINI in the last 168 hours. No results for input(s): TROPIPOC in the last 168 hours.   BNPNo results for input(s): BNP, PROBNP in the last 168 hours.   DDimer No results for input(s): DDIMER in the last 168 hours.   Radiology    DG Chest 2 View  Result Date: 07/16/2019 CLINICAL DATA:  Atrial fibrillation EXAM: CHEST - 2 VIEW COMPARISON:  07/15/2019 FINDINGS: Cardiac shadow is mildly enlarged. Aortic calcifications are again seen. Patient is rotated to the left accentuating the mediastinal markings. No focal infiltrate or sizable effusion is seen. No acute bony abnormality is noted. Chronic compression deformity in the lower thoracic spine is  seen. IMPRESSION: Chronic changes without acute abnormality. Electronically Signed   By: Alcide Clever M.D.   On: 07/16/2019 17:54   ECHOCARDIOGRAM COMPLETE  Result Date: 07/17/2019   ECHOCARDIOGRAM REPORT   Patient Name:   Kendra Hughes Date of Exam: 07/17/2019 Medical Rec #:  [id number]    Height:       63.0 in Accession #:    [id number]   Weight:       146.0 lb Date of Birth:  01/22/1923    BSA:          1.69 m Patient Age:    86 years     BP:           123/98 mmHg Patient Gender: F            HR:           129 bpm. Exam Location:  Inpatient Procedure: 2D Echo                             MODIFIED REPORT: This report was modified by Olga Millers MD on 07/17/2019 due to Change.  Indications:     Atrial Fibrillation 427.31 / I48.91  History:         Patient has prior history of Echocardiogram examinations, most                  recent 11/15/2015. Arrythmias:Tachycardia,                  Signs/Symptoms:Hypotension and Shortness of Breath; Risk                  Factors:Hypertension and Dyslipidemia.   Sonographer:     Leda Min Referring Phys:  [id number] Grippe Diagnosing Phys: Olga Millers MD IMPRESSIONS  1. Left ventricular ejection fraction, by visual estimation, is 50 to 55%. The left ventricle has normal function. There is no left ventricular hypertrophy.  2. Left ventricular diastolic function could not be evaluated.  3. The left ventricle has no regional wall motion abnormalities.  4. Global right ventricle has normal systolic function.The right ventricular size is mildly enlarged.  5. Left atrial size was mildly dilated.  6. Right atrial size was severely dilated.  7. The mitral valve is normal in structure. Mild mitral valve regurgitation. No evidence of mitral stenosis.  8. The tricuspid valve is normal in structure.  9. The aortic valve is tricuspid. Aortic valve regurgitation is not visualized. Mild aortic valve stenosis. 10. The pulmonic valve was normal in structure. Pulmonic valve regurgitation is not visualized. 11. Mildly elevated pulmonary artery systolic pressure. 12. The inferior vena cava is normal in size with greater than 50% respiratory variability, suggesting right atrial pressure of 3 mmHg. 13. Normal LV systolic function; biatrial enlargement; mild RVE; calcified aortic valve with mild AS (mean gradient 11 mmHg; visually appears moderate); mild MR; moderate TR. FINDINGS  Left Ventricle: Left ventricular ejection fraction, by visual estimation, is 50 to 55%. The left ventricle has normal function. The left ventricle has no regional wall motion abnormalities. There is no left ventricular hypertrophy. The left ventricular diastology could not be evaluated due to atrial fibrillation. Left ventricular diastolic function could not be evaluated. Normal left atrial pressure. Right Ventricle: The right ventricular size is mildly enlarged. Global RV systolic function is has normal systolic function. The tricuspid regurgitant velocity is 2.92 m/s, and with an assumed right atrial  pressure of 3 mmHg,  the estimated right ventricular systolic pressure is mildly elevated at 37.1 mmHg. Left Atrium: Left atrial size was mildly dilated. Right Atrium: Right atrial size was severely dilated Pericardium: There is no evidence of pericardial effusion. Mitral Valve: The mitral valve is normal in structure. Mild mitral valve regurgitation. No evidence of mitral valve stenosis by observation. Tricuspid Valve: The tricuspid valve is normal in structure. Tricuspid valve regurgitation moderate. Aortic Valve: The aortic valve is tricuspid. Aortic valve regurgitation is not visualized. Mild aortic stenosis is present. Aortic valve mean gradient measures 11.2 mmHg. Aortic valve peak gradient measures 18.9 mmHg. Aortic valve area, by VTI measures 0.89 cm. Pulmonic Valve: The pulmonic valve was normal in structure. Pulmonic valve regurgitation is not visualized. Pulmonic regurgitation is not visualized. Aorta: The aortic root is normal in size and structure. Venous: The inferior vena cava is normal in size with greater than 50% respiratory variability, suggesting right atrial pressure of 3 mmHg. IAS/Shunts: No atrial level shunt detected by color flow Doppler. Additional Comments: Normal LV systolic function; biatrial enlargement; mild RVE; calcified aortic valve with mild AS (mean gradient 11 mmHg; visually appears moderate); mild MR; moderate TR.  LEFT VENTRICLE PLAX 2D LVIDd:         3.40 cm LVIDs:         2.90 cm LV PW:         0.90 cm LV IVS:        0.90 cm LVOT diam:     1.80 cm LV SV:         15 ml LV SV Index:   8.80 LVOT Area:     2.54 cm  RIGHT VENTRICLE RV S prime:     8.05 cm/s TAPSE (M-mode): 1.2 cm LEFT ATRIUM             Index       RIGHT ATRIUM           Index LA diam:        4.70 cm 2.78 cm/m  RA Area:     23.90 cm LA Vol (A2C):   46.5 ml 27.49 ml/m RA Volume:   72.40 ml  42.80 ml/m LA Vol (A4C):   52.8 ml 31.21 ml/m LA Biplane Vol: 49.6 ml 29.32 ml/m  AORTIC VALVE AV Area (Vmax):    0.89  cm AV Area (Vmean):   0.86 cm AV Area (VTI):     0.89 cm AV Vmax:           217.40 cm/s AV Vmean:          157.000 cm/s AV VTI:            0.373 m AV Peak Grad:      18.9 mmHg AV Mean Grad:      11.2 mmHg LVOT Vmax:         76.13 cm/s LVOT Vmean:        53.100 cm/s LVOT VTI:          0.130 m LVOT/AV VTI ratio: 0.35  AORTA Ao Root diam: 2.70 cm TRICUSPID VALVE TR Peak grad:   34.1 mmHg TR Vmax:        292.00 cm/s  SHUNTS Systemic VTI:  0.13 m Systemic Diam: 1.80 cm  Olga Millers MD Electronically signed by Olga Millers MD Signature Date/Time: 07/17/2019/2:34:47 PM    Final (Updated)     Cardiac Studies   2D echo reviewed - mild LV dysfunction  Patient Profile     86 y.o.  female admitted with back pain and weakness and we have been consulted for atrial fib with an RVR  Assessment & Plan    1. Atrial fib - - with an RVR - at this point I would continue IV diltiazem and uptitrate her beta blocker.  2. LV dysfunction - her EF is only mildly reduced. Continue beta blocker. 3. UTI - hopefully treatment will help her other problems.      For questions or updates, please contact [facility] Please consult [email or web address] for contact info under Cardiology/STEMI.      Signed, Lewayne Bunting, MD  07/18/2019, 9:00 AM  Patient ID: Marion Downer, female   DOB: 1923/05/19, 86 y.o.   MRN: [id number]

## 2019-07-18 NOTE — Progress Notes (Signed)
Kendra Hughes  PROGRESS NOTE    Kendra Hughes  LPF:[id number] DOB: 1922/12/26 DOA: 07/16/2019 PCP: Carmin Richmond, MD   Brief Narrative:   [name] a86 y.o.female,w rheumatoid arthritis, hypertension, hyperlipidemia, CHF (diastolic), hx of LAA thrombus, Pafib (Chadsvasc 5, h/o remote DVT, s/p L hip fracture 2017, s/p L5 compression fracture, apparently presents with fatigue and dyspnea for the past 2-4 weeks. Pt was noted to have Afib with RVR yesterday and seen in ER in [facility] and given iv metoprolol w some improvement and sent home. Apparently seen by PCP this morning and had Afib w RVR and sent to [facility] ER for evaluation.  07/18/19: Hx of chronic back pain w/ visits to the spine center. Let's get PT/OT eval. Continue PRN pain meds. HR improved. Appreciate cards assistance  Assessment & Plan:   Principal Problem:   Atrial fibrillation with RVR (HCC) Active Problems:   HTN (hypertension)   Hyperlipidemia   Hyponatremia   Hypokalemia  Afib with RVR     - on dilt gtt; continuing home metoprolol     - followed by cards; has had difficulty with amiodarone and dig in the past     - cards consulted, spoke with Dr. Antoine Poche, appreciate assistance     - echo ordered     - mildly elevated trp; denies CP     - pharm dosing coumadin     - 07/18/19: HR improved, appreciate cards assistance: dilt gtt, metoprolol, coumadin  Hypokalemia     - resolved (3.8); continue K+ supplementation  Acute lower UTI (citrobacter freundi)     - UCx positive for citrobacter     - currently on rocephin; reports burning with urination, will continue abx for now, but will change to cefdinir     - 07/18/19: cefdinir through tomorrow  Hyponatremia     - resolved  chronic diastolic HF     - continue home lasix; fluid status ok, monitor  Chronic back pain     - long history with back Fx, she says she's had multiple visits to the spine center for her back pain; son confirms     - PRNs available     - PT/OT  eval  DVT prophylaxis: coumadin Code Status: FULL Family Communication: Spoke with son by phone   Disposition Plan: TBD  Consultants:   Cardiology  Antimicrobials:  . Cefdinir   ROS:  Denies CP, N, V, dyspnea, palpitations . Remainder 10-pt ROS is negative for all not previously mentioned.  Subjective: "I think it's better today."  Objective: Vitals:   07/18/19 0830 07/18/19 0900 07/18/19 1102 07/18/19 1133  BP:  (!) 82/65 114/73 122/84  Pulse:  100 (!) 113 91  Resp:  20    Temp: (!) 97.5 F (36.4 C)   (!) 97.5 F (36.4 C)  TempSrc: Oral   Oral  SpO2:  92%  94%  Weight:      Height:        Intake/Output Summary (Last 24 hours) at 07/18/2019 1407 Last data filed at 07/18/2019 0831 Gross per 24 hour  Intake 360 ml  Output 500 ml  Net -140 ml   Filed Weights   07/16/19 2249 07/17/19 0422 07/18/19 0436  Weight: 66.2 kg 66.2 kg 68.7 kg    Examination:  General: 86 y.o. female resting in bed in NAD Cardiovascular: tachy irregular, +S1, S2, no m/g/r, equal pulses throughout Respiratory: CTABL, no w/r/r, normal WOB GI: BS+, NDNT, no masses noted, no organomegaly noted MSK:  No e/c/c Neuro: Alert to name, follows commands Psyc: Appropriate interaction and affect, calm/cooperative   Data Reviewed: I have personally reviewed following labs and imaging studies.  CBC: Recent Labs  Lab 07/16/19 1717 07/17/19 0106 07/18/19 0119  WBC 10.2 9.3 9.1  NEUTROABS  --   --  7.3  HGB 14.0 13.0 12.9  HCT 42.2 38.9 39.1  MCV 93.4 93.7 92.4  PLT 296 277 268   Basic Metabolic Panel: Recent Labs  Lab 07/16/19 1717 07/17/19 0106 07/18/19 0119  NA 133* 137 136  K 3.2* 3.6 3.8  CL 92* 95* 97*  CO2 GLUCOSE 107* 116* 112*  BUN 25* 20 23  CREATININE 0.89 0.85 0.86  CALCIUM 9.3 9.0 9.1  MG  --  2.2 2.1  PHOS  --   --  3.6   GFR: Estimated Creatinine Clearance: 37.2 mL/min (by C-G formula based on SCr of 0.86 mg/dL). Liver Function Tests: Recent Labs   Lab 07/17/19 0106 07/18/19 0119  AST 22  --   ALT 19  --   ALKPHOS 92  --   BILITOT 0.9  --   PROT 6.7  --   ALBUMIN 3.4* 3.2*   No results for input(s): LIPASE, AMYLASE in the last 168 hours. No results for input(s): AMMONIA in the last 168 hours. Coagulation Profile: Recent Labs  Lab 07/16/19 1840 07/17/19 0106 07/18/19 0119  INR 2.2* 2.2* 2.3*   Cardiac Enzymes: No results for input(s): CKTOTAL, CKMB, CKMBINDEX, TROPONINI in the last 168 hours. BNP (last 3 results) No results for input(s): PROBNP in the last 8760 hours. HbA1C: No results for input(s): HGBA1C in the last 72 hours. CBG: No results for input(s): GLUCAP in the last 168 hours. Lipid Profile: No results for input(s): CHOL, HDL, LDLCALC, TRIG, CHOLHDL, LDLDIRECT in the last 72 hours. Thyroid Function Tests: Recent Labs    07/17/19 0106  TSH 1.578   Anemia Panel: No results for input(s): VITAMINB12, FOLATE, FERRITIN, TIBC, IRON, RETICCTPCT in the last 72 hours. Sepsis Labs: No results for input(s): PROCALCITON, LATICACIDVEN in the last 168 hours.  Recent Results (from the past 240 hour(s))  Respiratory Panel by RT PCR (Flu A&B, Covid) -     Status: None   Collection Time: 07/16/19  8:27 PM  Result Value Ref Range Status   SARS Coronavirus 2 by RT PCR NEGATIVE NEGATIVE Final    Comment: (NOTE) SARS-CoV-2 target nucleic acids are NOT DETECTED. The SARS-CoV-2 RNA is generally detectable in upper respiratoy specimens during the acute phase of infection. The lowest concentration of SARS-CoV-2 viral copies this assay can detect is 131 copies/mL. A negative result does not preclude SARS-Cov-2 infection and should not be used as the sole basis for treatment or other patient management decisions. A negative result may occur with  improper specimen collection/handling, submission of specimen other than nasopharyngeal swab, presence of viral mutation(s) within the areas targeted by this assay, and inadequate  number of viral copies (<131 copies/mL). A negative result must be combined with clinical observations, patient history, and epidemiological information. The expected result is Negative. Fact Sheet for Patients:  https://www.moore.com/ Fact Sheet for Healthcare Providers:  https://www.young.biz/ This test is not yet ap proved or cleared by the [place] FDA and  has been authorized for detection and/or diagnosis of SARS-CoV-2 by FDA under an Emergency Use Authorization (EUA). This EUA will remain  in effect (meaning this test can be used) for the duration of the COVID-19 declaration under  Section 564(b)(1) of the Act, 21 U.S.C. section 360bbb-3(b)(1), unless the authorization is terminated or revoked sooner.    Influenza A by PCR NEGATIVE NEGATIVE Final   Influenza B by PCR NEGATIVE NEGATIVE Final    Comment: (NOTE) The Xpert Xpress SARS-CoV-2/FLU/RSV assay is intended as an aid in  the diagnosis of influenza from Nasopharyngeal swab specimens and  should not be used as a sole basis for treatment. Nasal washings and  aspirates are unacceptable for Xpert Xpress SARS-CoV-2/FLU/RSV  testing. Fact Sheet for Patients: https://www.moore.com/ Fact Sheet for Healthcare Providers: https://www.young.biz/ This test is not yet approved or cleared by the [place] FDA and  has been authorized for detection and/or diagnosis of SARS-CoV-2 by  FDA under an Emergency Use Authorization (EUA). This EUA will remain  in effect (meaning this test can be used) for the duration of the  Covid-19 declaration under Section 564(b)(1) of the Act, 21  U.S.C. section 360bbb-3(b)(1), unless the authorization is  terminated or revoked. Performed at [facility], [address], [place], [place] [address]       Radiology Studies: DG Chest 2 View  Result Date: 07/16/2019 CLINICAL DATA:  Atrial fibrillation EXAM: CHEST -  2 VIEW COMPARISON:  07/15/2019 FINDINGS: Cardiac shadow is mildly enlarged. Aortic calcifications are again seen. Patient is rotated to the left accentuating the mediastinal markings. No focal infiltrate or sizable effusion is seen. No acute bony abnormality is noted. Chronic compression deformity in the lower thoracic spine is seen. IMPRESSION: Chronic changes without acute abnormality. Electronically Signed   By: Alcide Clever M.D.   On: 07/16/2019 17:54   ECHOCARDIOGRAM COMPLETE  Result Date: 07/17/2019   ECHOCARDIOGRAM REPORT   Patient Name:   RAELEY GILMORE Date of Exam: 07/17/2019 Medical Rec #:  [id number]    Height:       63.0 in Accession #:    [id number]   Weight:       146.0 lb Date of Birth:  [birth year]    BSA:          1.69 m Patient Age:    86 years     BP:           123/98 mmHg Patient Gender: F            HR:           129 bpm. Exam Location:  Inpatient Procedure: 2D Echo                             MODIFIED REPORT: This report was modified by Olga Millers MD on 07/17/2019 due to Change.  Indications:     Atrial Fibrillation 427.31 / I48.91  History:         Patient has prior history of Echocardiogram examinations, most                  recent 11/15/2015. Arrythmias:Tachycardia,                  Signs/Symptoms:Hypotension and Shortness of Breath; Risk                  Factors:Hypertension and Dyslipidemia.  Sonographer:     Leda Min Referring Phys:  [id number] Diagnosing Phys: Olga Millers MD IMPRESSIONS  1. Left ventricular ejection fraction, by visual estimation, is 50 to 55%. The left ventricle has normal function. There is no left ventricular hypertrophy.  2. Left ventricular diastolic  function could not be evaluated.  3. The left ventricle has no regional wall motion abnormalities.  4. Global right ventricle has normal systolic function.The right ventricular size is mildly enlarged.  5. Left atrial size was mildly dilated.  6. Right atrial size was severely dilated.  7. The mitral  valve is normal in structure. Mild mitral valve regurgitation. No evidence of mitral stenosis.  8. The tricuspid valve is normal in structure.  9. The aortic valve is tricuspid. Aortic valve regurgitation is not visualized. Mild aortic valve stenosis. 10. The pulmonic valve was normal in structure. Pulmonic valve regurgitation is not visualized. 11. Mildly elevated pulmonary artery systolic pressure. 12. The inferior vena cava is normal in size with greater than 50% respiratory variability, suggesting right atrial pressure of 3 mmHg. 13. Normal LV systolic function; biatrial enlargement; mild RVE; calcified aortic valve with mild AS (mean gradient 11 mmHg; visually appears moderate); mild MR; moderate TR. FINDINGS  Left Ventricle: Left ventricular ejection fraction, by visual estimation, is 50 to 55%. The left ventricle has normal function. The left ventricle has no regional wall motion abnormalities. There is no left ventricular hypertrophy. The left ventricular diastology could not be evaluated due to atrial fibrillation. Left ventricular diastolic function could not be evaluated. Normal left atrial pressure. Right Ventricle: The right ventricular size is mildly enlarged. Global RV systolic function is has normal systolic function. The tricuspid regurgitant velocity is 2.92 m/s, and with an assumed right atrial pressure of 3 mmHg, the estimated right ventricular systolic pressure is mildly elevated at 37.1 mmHg. Left Atrium: Left atrial size was mildly dilated. Right Atrium: Right atrial size was severely dilated Pericardium: There is no evidence of pericardial effusion. Mitral Valve: The mitral valve is normal in structure. Mild mitral valve regurgitation. No evidence of mitral valve stenosis by observation. Tricuspid Valve: The tricuspid valve is normal in structure. Tricuspid valve regurgitation moderate. Aortic Valve: The aortic valve is tricuspid. Aortic valve regurgitation is not visualized. Mild aortic  stenosis is present. Aortic valve mean gradient measures 11.2 mmHg. Aortic valve peak gradient measures 18.9 mmHg. Aortic valve area, by VTI measures 0.89 cm. Pulmonic Valve: The pulmonic valve was normal in structure. Pulmonic valve regurgitation is not visualized. Pulmonic regurgitation is not visualized. Aorta: The aortic root is normal in size and structure. Venous: The inferior vena cava is normal in size with greater than 50% respiratory variability, suggesting right atrial pressure of 3 mmHg. IAS/Shunts: No atrial level shunt detected by color flow Doppler. Additional Comments: Normal LV systolic function; biatrial enlargement; mild RVE; calcified aortic valve with mild AS (mean gradient 11 mmHg; visually appears moderate); mild MR; moderate TR.  LEFT VENTRICLE PLAX 2D LVIDd:         3.40 cm LVIDs:         2.90 cm LV PW:         0.90 cm LV IVS:        0.90 cm LVOT diam:     1.80 cm LV SV:         15 ml LV SV Index:   8.80 LVOT Area:     2.54 cm  RIGHT VENTRICLE RV S prime:     8.05 cm/s TAPSE (M-mode): 1.2 cm LEFT ATRIUM             Index       RIGHT ATRIUM           Index LA diam:        4.70  cm 2.78 cm/m  RA Area:     23.90 cm LA Vol (A2C):   46.5 ml 27.49 ml/m RA Volume:   72.40 ml  42.80 ml/m LA Vol (A4C):   52.8 ml 31.21 ml/m LA Biplane Vol: 49.6 ml 29.32 ml/m  AORTIC VALVE AV Area (Vmax):    0.89 cm AV Area (Vmean):   0.86 cm AV Area (VTI):     0.89 cm AV Vmax:           217.40 cm/s AV Vmean:          157.000 cm/s AV VTI:            0.373 m AV Peak Grad:      18.9 mmHg AV Mean Grad:      11.2 mmHg LVOT Vmax:         76.13 cm/s LVOT Vmean:        53.100 cm/s LVOT VTI:          0.130 m LVOT/AV VTI ratio: 0.35  AORTA Ao Root diam: 2.70 cm TRICUSPID VALVE TR Peak grad:   34.1 mmHg TR Vmax:        292.00 cm/s  SHUNTS Systemic VTI:  0.13 m Systemic Diam: 1.80 cm  Kirk Ruths MD Electronically signed by Kirk Ruths MD Signature Date/Time: 07/17/2019/2:34:47 PM    Final (Updated)       Scheduled Meds: . cefdinir  300 mg Oral Q12H  . metoprolol tartrate  37.5 mg Oral BID  . warfarin  2.5 mg Oral ONCE-1800  . Warfarin - Pharmacist Dosing Inpatient   Does not apply q1800   Continuous Infusions: . diltiazem (CARDIZEM) infusion 5 mg/hr (07/18/19 1104)     LOS: 2 days    Time spent: 25 minutes spent in the coordination of care today.    Jonnie Finner, DO [facility]  If 7PM-7AM, please contact night-coverage [email or web address] 07/18/2019, 2:07 PM

## 2019-07-18 NOTE — Progress Notes (Signed)
ANTICOAGULATION CONSULT NOTE  Pharmacy Consult for warfarin Indication: atrial fibrillation   Vital Signs: Temp: 97.5 F (36.4 C) (01/10 0830) Temp Source: Oral (01/10 0830) BP: 114/73 (01/10 1102) Pulse Rate: 113 (01/10 1102)  Labs: Recent Labs    07/16/19 1717 07/16/19 1840 07/16/19 2314 07/17/19 0106 07/18/19 0119  HGB 14.0  --   --  13.0 12.9  HCT 42.2  --   --  38.9 39.1  PLT 296  --   --  277 268  LABPROT  --  24.6*  --  24.4* 25.1*  INR  --  2.2*  --  2.2* 2.3*  CREATININE 0.89  --   --  0.85 0.86  TROPONINIHS  --   --  18* 19*  --     Estimated Creatinine Clearance: 37.2 mL/min (by C-G formula based on SCr of 0.86 mg/dL).   Medical History: Past Medical History:  Diagnosis Date  . Arthritis    "right knee" (11/10/2015)  . Chronic diastolic (congestive) heart failure (HCC)   . DOE (dyspnea on exertion)   . DVT (deep venous thrombosis) (HCC) "years ago"  . Dyslipidemia   . Fracture of left hip (HCC) 11/09/2015   "fell"  . History of nuclear stress test 10/2009   normal exam  . Hypertension   . PAF (paroxysmal atrial fibrillation) (HCC)   . Rheumatoid arthritis (HCC) 1970   "it's in remission" (11/10/2015)     Assessment: 86 yo female on warfarin prior to admission for hx of AFib with RVR and hx of LAA thrombus. PTA warfarin: 2.5 mg po daily  INR today is therapeutic at 2.3. Of note, she did not receive a dose of warfarin yesterday, 1/9. Her H&H is stable at 12.9/39.1, and plts are wnl at 268. She is noted to not be eating 100% of meals. Renal function has been stable.   Goal of Therapy:  INR 2-3 Monitor platelets by anticoagulation protocol: Yes   Plan:  -Warfarin 2.5 mg x 1 today  -Daily INR -Monitor for signs of bleeding    Thank you,   Fara Olden, PharmD PGY-1 Pharmacy Resident   Please check amion for clinical pharmacist contact number

## 2019-07-18 NOTE — Plan of Care (Signed)
  Problem: Education: Goal: Knowledge of General Education information will improve Description: Including pain rating scale, medication(s)/side effects and non-pharmacologic comfort measures Outcome: Progressing   Problem: Activity: Goal: Risk for activity intolerance will decrease Outcome: Progressing   Problem: Coping: Goal: Level of anxiety will decrease Outcome: Progressing   Problem: Pain Managment: Goal: General experience of comfort will improve Outcome: Progressing   Problem: Safety: Goal: Ability to remain free from injury will improve Outcome: Progressing   Problem: Skin Integrity: Goal: Risk for impaired skin integrity will decrease Outcome: Progressing    Adair Patter, RN

## 2019-07-19 LAB — CBC WITH DIFFERENTIAL/PLATELET
Abs Immature Granulocytes: 0.06 10*3/uL (ref 0.00–0.07)
Basophils Absolute: 0 10*3/uL (ref 0.0–0.1)
Basophils Relative: 0 %
Eosinophils Absolute: 0.1 10*3/uL (ref 0.0–0.5)
Eosinophils Relative: 2 %
HCT: 39.3 % (ref 36.0–46.0)
Hemoglobin: 13 g/dL (ref 12.0–15.0)
Immature Granulocytes: 1 %
Lymphocytes Relative: 9 %
Lymphs Abs: 0.8 10*3/uL (ref 0.7–4.0)
MCH: 30.9 pg (ref 26.0–34.0)
MCHC: 33.1 g/dL (ref 30.0–36.0)
MCV: 93.3 fL (ref 80.0–100.0)
Monocytes Absolute: 0.6 10*3/uL (ref 0.1–1.0)
Monocytes Relative: 7 %
Neutro Abs: 7.3 10*3/uL (ref 1.7–7.7)
Neutrophils Relative %: 81 %
Platelets: 214 10*3/uL (ref 150–400)
RBC: 4.21 MIL/uL (ref 3.87–5.11)
RDW: 15.1 % (ref 11.5–15.5)
WBC: 8.9 10*3/uL (ref 4.0–10.5)
nRBC: 0 % (ref 0.0–0.2)

## 2019-07-19 LAB — PROTIME-INR
INR: 2.5 — ABNORMAL HIGH (ref 0.8–1.2)
Prothrombin Time: 27.2 seconds — ABNORMAL HIGH (ref 11.4–15.2)

## 2019-07-19 LAB — RENAL FUNCTION PANEL
Albumin: 3.1 g/dL — ABNORMAL LOW (ref 3.5–5.0)
Anion gap: 5 (ref 5–15)
BUN: 24 mg/dL — ABNORMAL HIGH (ref 8–23)
CO2: 33 mmol/L — ABNORMAL HIGH (ref 22–32)
Calcium: 9.1 mg/dL (ref 8.9–10.3)
Chloride: 100 mmol/L (ref 98–111)
Creatinine, Ser: 0.87 mg/dL (ref 0.44–1.00)
GFR calc Af Amer: >60 mL/min (ref >60)
GFR calc non Af Amer: 57 mL/min — ABNORMAL LOW (ref >60)
Glucose, Bld: 103 mg/dL — ABNORMAL HIGH (ref 70–99)
Phosphorus: 3.3 mg/dL (ref 2.5–4.6)
Potassium: 4.7 mmol/L (ref 3.5–5.1)
Sodium: 138 mmol/L (ref 135–145)

## 2019-07-19 LAB — MAGNESIUM: Magnesium: 2.2 mg/dL (ref 1.7–2.4)

## 2019-07-19 MED ORDER — WARFARIN SODIUM 2.5 MG PO TABS
2.5000 mg | ORAL_TABLET | Freq: Once | ORAL | Status: AC
  Administered 2019-07-19: 2.5 mg via ORAL
  Filled 2019-07-19: qty 1

## 2019-07-19 MED ORDER — DILTIAZEM HCL-DEXTROSE 125-5 MG/125ML-% IV SOLN (PREMIX)
5.0000 mg/h | INTRAVENOUS | Status: DC
  Administered 2019-07-19: 18:00:00 5 mg/h via INTRAVENOUS
  Filled 2019-07-19: qty 125

## 2019-07-19 MED ORDER — DILTIAZEM HCL ER COATED BEADS 120 MG PO CP24
120.0000 mg | ORAL_CAPSULE | Freq: Every day | ORAL | Status: DC
  Administered 2019-07-19: 11:00:00 120 mg via ORAL
  Filled 2019-07-19: qty 1

## 2019-07-19 NOTE — Progress Notes (Signed)
[name]  PROGRESS NOTE    GWYNNE KEMNITZ  DUK:[id number] DOB: 08-May-1923 DOA: 07/16/2019 PCP: Coy Saunas, MD   Brief Narrative:   [name] B86 y.o.female,w rheumatoid arthritis, hypertension, hyperlipidemia, CHF (diastolic), hx of LAA thrombus, Pafib (Chadsvasc 5, h/o remote DVT, s/p L hip fracture 2017, s/p L5 compression fracture, apparently presents with fatigue and dyspnea for the past 2-4 weeks. Pt was noted to have Afib with RVR yesterday and seen in ER in [facility] and given iv metoprolol w some improvement and sent home. Apparently seen by PCP this morning and had Afib w RVR and sent to [facility] ER for evaluation.  07/19/19: Completed abx for UTI. Lab work looks ok. To get PT/OT eval. Appreciate cards assistance with a fib.    Assessment & Plan:   Principal Problem:   Atrial fibrillation with RVR (HCC) Active Problems:   HTN (hypertension)   Hyperlipidemia   Hyponatremia   Hypokalemia  Afib with RVR - on dilt gtt; continuing home metoprolol - followed by cards; has had difficulty with amiodarone and dig in the past - cards consulted, spoke with Dr. Percival Spanish, appreciate assistance - echo: EF 50-55%, RA/LA severely dilated - mildly elevated trp; denies CP - pharm dosing coumadin     - appreciate cards help: dilt PO, metoprolol, coumadin     - rates up today during interview (130 - 160s)  Hypokalemia Hyponatremia - resolved (4.7); hold K+ supplementation  Acute lower UTI (citrobacter freundi) - UCx positive for citrobacter - currently on rocephin; reports burning with urination, will continue abx for now, but will change to cefdinir     - completed abx  chronic diastolic HF - continue home lasix; fluid status ok, monitor  Chronic back pain     - long history with back Fx, she says she's had multiple visits to the [facility] for her back pain; son confirms     - PRNs available     - PT/OT eval  DVT prophylaxis:  coumadin Code Status: FULL Family Communication: None at bedside   Disposition Plan: TBD  Consultants:   Cardiology  ROS:  Denies CP, N, V. Reports palpitations . Remainder 10-pt ROS is negative for all not previously mentioned.  Subjective: "I don't know if that will work."  Objective: Vitals:   07/19/19 0000 07/19/19 0028 07/19/19 0058 07/19/19 0504  BP: 107/78 107/69 100/62 91/65  Pulse: 80 78 87 68  Resp: (!) 23 20 (!) 22 16  Temp:    98.1 F (36.7 C)  TempSrc:    Oral  SpO2: 98% 96% 94% 99%  Weight:      Height:        Intake/Output Summary (Last 24 hours) at 07/19/2019 0843 Last data filed at 07/18/2019 1913 Gross per 24 hour  Intake 360 ml  Output --  Net 360 ml   Filed Weights   07/16/19 2249 07/17/19 0422 07/18/19 0436  Weight: 66.2 kg 66.2 kg 68.7 kg    Examination:  General: 86 y.o. female resting in bed in NAD in bed in NAD Cardiovascular: tachy, irregular, +S1, S2, no m/g/ Respiratory: CTABL, no w/r/r GI: BS+, NDNT, no masses noted, no organomegaly noted MSK: No e/c/c Neuro: Alert and follows commands Psyc: calm/cooperative   Data Reviewed: I have personally reviewed following labs and imaging studies.  CBC: Recent Labs  Lab 07/16/19 1717 07/17/19 0106 07/18/19 0119 07/19/19 0316  WBC 10.2 9.3 9.1 8.9  NEUTROABS  --   --  7.3 7.3  HGB 14.0 13.0  12.9 13.0  HCT 42.2 38.9 39.1 39.3  MCV 93.4 93.7 92.4 93.3  PLT 296 277 268 214   Basic Metabolic Panel: Recent Labs  Lab 07/16/19 1717 07/17/19 0106 07/18/19 0119 07/19/19 0316  NA 133* 137 136 138  K 3.2* 3.6 3.8 4.7  CL 92* 95* 97* 100  CO2 29 31 30  33*  GLUCOSE 107* 116* 112* 103*  BUN 25* 20 23 24*  CREATININE 0.89 0.85 0.86 0.87  CALCIUM 9.3 9.0 9.1 9.1  MG  --  2.2 2.1 2.2  PHOS  --   --  3.6 3.3   GFR: Estimated Creatinine Clearance: 36.8 mL/min (by C-G formula based on SCr of 0.87 mg/dL). Liver Function Tests: Recent Labs  Lab 07/17/19 0106 07/18/19 0119 07/19/19 0316  AST 22   --   --   ALT 19  --   --   ALKPHOS 92  --   --   BILITOT 0.9  --   --   PROT 6.7  --   --   ALBUMIN 3.4* 3.2* 3.1*   No results for input(s): LIPASE, AMYLASE in the last 168 hours. No results for input(s): AMMONIA in the last 168 hours. Coagulation Profile: Recent Labs  Lab 07/16/19 1840 07/17/19 0106 07/18/19 0119 07/19/19 0316  INR 2.2* 2.2* 2.3* 2.5*   Cardiac Enzymes: No results for input(s): CKTOTAL, CKMB, CKMBINDEX, TROPONINI in the last 168 hours. BNP (last 3 results) No results for input(s): PROBNP in the last 8760 hours. HbA1C: No results for input(s): HGBA1C in the last 72 hours. CBG: No results for input(s): GLUCAP in the last 168 hours. Lipid Profile: No results for input(s): CHOL, HDL, LDLCALC, TRIG, CHOLHDL, LDLDIRECT in the last 72 hours. Thyroid Function Tests: Recent Labs    07/17/19 0106  TSH 1.578   Anemia Panel: No results for input(s): VITAMINB12, FOLATE, FERRITIN, TIBC, IRON, RETICCTPCT in the last 72 hours. Sepsis Labs: No results for input(s): PROCALCITON, LATICACIDVEN in the last 168 hours.  Recent Results (from the past 240 hour(s))  Respiratory Panel by RT PCR (Flu A&B, Covid) -     Status: None   Collection Time: 07/16/19  8:27 PM  Result Value Ref Range Status   SARS Coronavirus 2 by RT PCR NEGATIVE NEGATIVE Final    Comment: (NOTE) SARS-CoV-2 target nucleic acids are NOT DETECTED. The SARS-CoV-2 RNA is generally detectable in upper respiratoy specimens during the acute phase of infection. The lowest concentration of SARS-CoV-2 viral copies this assay can detect is 131 copies/mL. A negative result does not preclude SARS-Cov-2 infection and should not be used as the sole basis for treatment or other patient management decisions. A negative result may occur with  improper specimen collection/handling, submission of specimen other than nasopharyngeal swab, presence of viral mutation(s) within the areas targeted by this assay, and  inadequate number of viral copies (<131 copies/mL). A negative result must be combined with clinical observations, patient history, and epidemiological information. The expected result is Negative. Fact Sheet for Patients:  09/13/19 Fact Sheet for Healthcare Providers:  https://www.moore.com/ This test is not yet ap proved or cleared by the https://www.young.biz/ FDA and  has been authorized for detection and/or diagnosis of SARS-CoV-2 by FDA under an Emergency Use Authorization (EUA). This EUA will remain  in effect (meaning this test can be used) for the duration of the COVID-19 declaration under Section 564(b)(1) of the Act, 21 U.S.C. section 360bbb-3(b)(1), unless the authorization is terminated or revoked sooner.  Influenza A by PCR NEGATIVE NEGATIVE Final   Influenza B by PCR NEGATIVE NEGATIVE Final    Comment: (NOTE) The Xpert Xpress SARS-CoV-2/FLU/RSV assay is intended as an aid in  the diagnosis of influenza from Nasopharyngeal swab specimens and  should not be used as a sole basis for treatment. Nasal washings and  aspirates are unacceptable for Xpert Xpress SARS-CoV-2/FLU/RSV  testing. Fact Sheet for Patients: https://www.moore.com/ Fact Sheet for Healthcare Providers: https://www.young.biz/ This test is not yet approved or cleared by the [place] FDA and  has been authorized for detection and/or diagnosis of SARS-CoV-2 by  FDA under an Emergency Use Authorization (EUA). This EUA will remain  in effect (meaning this test can be used) for the duration of the  Covid-19 declaration under Section 564(b)(1) of the Act, 21  U.S.C. section 360bbb-3(b)(1), unless the authorization is  terminated or revoked. Performed at [facility], [address], [place], [place] [address]       Radiology Studies: ECHOCARDIOGRAM COMPLETE  Result Date: 07/17/2019   ECHOCARDIOGRAM REPORT    Patient Name:   ZAKYLA TONCHE Date of Exam: 07/17/2019 Medical Rec #:  [id number]    Height:       63.0 in Accession #:    [id number]   Weight:       146.0 lb Date of Birth:  October 04, 1922    BSA:          1.69 m Patient Age:    89 years     BP:           123/98 mmHg Patient Gender: F            HR:           129 bpm. Exam Location:  Inpatient Procedure: 2D Echo                             MODIFIED REPORT: This report was modified by Olga Millers MD on 07/17/2019 due to Change.  Indications:     Atrial Fibrillation 427.31 / I48.91  History:         Patient has prior history of Echocardiogram examinations, most                  recent 11/15/2015. Arrythmias:Tachycardia,                  Signs/Symptoms:Hypotension and Shortness of Breath; Risk                  Factors:Hypertension and Dyslipidemia.  Sonographer:     Leda Min Referring Phys:  [id number] Diagnosing Phys: Olga Millers MD IMPRESSIONS  1. Left ventricular ejection fraction, by visual estimation, is 50 to 55%. The left ventricle has normal function. There is no left ventricular hypertrophy.  2. Left ventricular diastolic function could not be evaluated.  3. The left ventricle has no regional wall motion abnormalities.  4. Global right ventricle has normal systolic function.The right ventricular size is mildly enlarged.  5. Left atrial size was mildly dilated.  6. Right atrial size was severely dilated.  7. The mitral valve is normal in structure. Mild mitral valve regurgitation. No evidence of mitral stenosis.  8. The tricuspid valve is normal in structure.  9. The aortic valve is tricuspid. Aortic valve regurgitation is not visualized. Mild aortic valve stenosis. 10. The pulmonic valve was normal in structure. Pulmonic valve regurgitation is not visualized. 11. Mildly elevated  pulmonary artery systolic pressure. 12. The inferior vena cava is normal in size with greater than 50% respiratory variability, suggesting right atrial pressure of 3 mmHg.  13. Normal LV systolic function; biatrial enlargement; mild RVE; calcified aortic valve with mild AS (mean gradient 11 mmHg; visually appears moderate); mild MR; moderate TR. FINDINGS  Left Ventricle: Left ventricular ejection fraction, by visual estimation, is 50 to 55%. The left ventricle has normal function. The left ventricle has no regional wall motion abnormalities. There is no left ventricular hypertrophy. The left ventricular diastology could not be evaluated due to atrial fibrillation. Left ventricular diastolic function could not be evaluated. Normal left atrial pressure. Right Ventricle: The right ventricular size is mildly enlarged. Global RV systolic function is has normal systolic function. The tricuspid regurgitant velocity is 2.92 m/s, and with an assumed right atrial pressure of 3 mmHg, the estimated right ventricular systolic pressure is mildly elevated at 37.1 mmHg. Left Atrium: Left atrial size was mildly dilated. Right Atrium: Right atrial size was severely dilated Pericardium: There is no evidence of pericardial effusion. Mitral Valve: The mitral valve is normal in structure. Mild mitral valve regurgitation. No evidence of mitral valve stenosis by observation. Tricuspid Valve: The tricuspid valve is normal in structure. Tricuspid valve regurgitation moderate. Aortic Valve: The aortic valve is tricuspid. Aortic valve regurgitation is not visualized. Mild aortic stenosis is present. Aortic valve mean gradient measures 11.2 mmHg. Aortic valve peak gradient measures 18.9 mmHg. Aortic valve area, by VTI measures 0.89 cm. Pulmonic Valve: The pulmonic valve was normal in structure. Pulmonic valve regurgitation is not visualized. Pulmonic regurgitation is not visualized. Aorta: The aortic root is normal in size and structure. Venous: The inferior vena cava is normal in size with greater than 50% respiratory variability, suggesting right atrial pressure of 3 mmHg. IAS/Shunts: No atrial level shunt  detected by color flow Doppler. Additional Comments: Normal LV systolic function; biatrial enlargement; mild RVE; calcified aortic valve with mild AS (mean gradient 11 mmHg; visually appears moderate); mild MR; moderate TR.  LEFT VENTRICLE PLAX 2D LVIDd:         3.40 cm LVIDs:         2.90 cm LV PW:         0.90 cm LV IVS:        0.90 cm LVOT diam:     1.80 cm LV SV:         15 ml LV SV Index:   8.80 LVOT Area:     2.54 cm  RIGHT VENTRICLE RV S prime:     8.05 cm/s TAPSE (M-mode): 1.2 cm LEFT ATRIUM             Index       RIGHT ATRIUM           Index LA diam:        4.70 cm 2.78 cm/m  RA Area:     23.90 cm LA Vol (A2C):   46.5 ml 27.49 ml/m RA Volume:   72.40 ml  42.80 ml/m LA Vol (A4C):   52.8 ml 31.21 ml/m LA Biplane Vol: 49.6 ml 29.32 ml/m  AORTIC VALVE AV Area (Vmax):    0.89 cm AV Area (Vmean):   0.86 cm AV Area (VTI):     0.89 cm AV Vmax:           217.40 cm/s AV Vmean:          157.000 cm/s AV VTI:  0.373 m AV Peak Grad:      18.9 mmHg AV Mean Grad:      11.2 mmHg LVOT Vmax:         76.13 cm/s LVOT Vmean:        53.100 cm/s LVOT VTI:          0.130 m LVOT/AV VTI ratio: 0.35  AORTA Ao Root diam: 2.70 cm TRICUSPID VALVE TR Peak grad:   34.1 mmHg TR Vmax:        292.00 cm/s  SHUNTS Systemic VTI:  0.13 m Systemic Diam: 1.80 cm  Olga Millers MD Electronically signed by Olga Millers MD Signature Date/Time: 07/17/2019/2:34:47 PM    Final (Updated)      Scheduled Meds: . cefdinir  300 mg Oral Q12H  . diltiazem  120 mg Oral Daily  . metoprolol tartrate  37.5 mg Oral BID  . Warfarin - Pharmacist Dosing Inpatient   Does not apply q1800   Continuous Infusions:   LOS: 3 days    Time spent: 25 minutes spent in the coordination of care today.    Teddy Spike, DO [facility]  If 7PM-7AM, please contact night-coverage [email or web address] 07/19/2019, 8:43 AM

## 2019-07-19 NOTE — Progress Notes (Signed)
ANTICOAGULATION CONSULT NOTE  Pharmacy Consult for warfarin Indication: atrial fibrillation   Vital Signs: Temp: 98.1 F (36.7 C) (01/11 0504) Temp Source: Oral (01/11 0504) BP: 91/65 (01/11 0504) Pulse Rate: 68 (01/11 0504)  Labs: Recent Labs    07/16/19 2314 07/17/19 0106 07/18/19 0119 07/19/19 0316  HGB  --  13.0 12.9 13.0  HCT  --  38.9 39.1 39.3  PLT  --  277 268 214  LABPROT  --  24.4* 25.1* 27.2*  INR  --  2.2* 2.3* 2.5*  CREATININE  --  0.85 0.86 0.87  TROPONINIHS 18* 19*  --   --     Estimated Creatinine Clearance: 36.8 mL/min (by C-G formula based on SCr of 0.87 mg/dL).   Medical History: Past Medical History:  Diagnosis Date  . Arthritis    "right knee" (11/10/2015)  . Chronic diastolic (congestive) heart failure (HCC)   . DOE (dyspnea on exertion)   . DVT (deep venous thrombosis) (HCC) "years ago"  . Dyslipidemia   . Fracture of left hip (HCC) 11/09/2015   "fell"  . History of nuclear stress test 10/2009   normal exam  . Hypertension   . PAF (paroxysmal atrial fibrillation) (HCC)   . Rheumatoid arthritis (HCC) 1970   "it's in remission" (11/10/2015)     Assessment: 86 yo female on warfarin prior to admission for hx of AFib with RVR and hx of LAA thrombus. PTA warfarin: 2.5 mg po daily  INR today is therapeutic at 2.5. Of note, she did not receive a dose of warfarin on 1/9. Hgb stable at 13, plts WNL at 214. Eating 25-50% of documented meals. Renal function has been stable.   Goal of Therapy:  INR 2-3 Monitor platelets by anticoagulation protocol: Yes   Plan:  -Warfarin 2.5 mg x 1 today  -Daily INR -Monitor for signs of bleeding    Thank you,   Sherron Monday, PharmD, BCCCP Clinical Pharmacist  Phone: [phone]  Please check AMION for all [facility] phone numbers After 10:00 PM, call Main Pharmacy [phone]

## 2019-07-19 NOTE — Progress Notes (Addendum)
Progress Note  Patient Name: NYASIA BAXLEY Date of Encounter: 07/19/2019  Primary Cardiologist: Chrystie Nose, MD   Subjective  No chest pain or SOB  Inpatient Medications    Scheduled Meds: . cefdinir  300 mg Oral Q12H  . metoprolol tartrate  37.5 mg Oral BID  . Warfarin - Pharmacist Dosing Inpatient   Does not apply q1800   Continuous Infusions: . diltiazem (CARDIZEM) infusion 5 mg/hr (07/19/19 0309)   PRN Meds: acetaminophen **OR** acetaminophen, ondansetron (ZOFRAN) IV, Polyethyl Glycol-Propyl Glycol, traMADol   Vital Signs    Vitals:   07/19/19 0000 07/19/19 0028 07/19/19 0058 07/19/19 0504  BP: 107/78 107/69 100/62 91/65  Pulse: 80 78 87 68  Resp: (!) 23 20 (!) 22 16  Temp:    98.1 F (36.7 C)  TempSrc:    Oral  SpO2: 98% 96% 94% 99%  Weight:      Height:        Intake/Output Summary (Last 24 hours) at 07/19/2019 0701 Last data filed at 07/18/2019 1913 Gross per 24 hour  Intake 720 ml  Output --  Net 720 ml   Last 3 Weights 07/18/2019 07/17/2019 07/16/2019  Weight (lbs) 151 lb 7.3 oz 146 lb 146 lb  Weight (kg) 68.7 kg 66.225 kg 66.225 kg      Telemetry    Affib, HR 70-80s, pauses overnight (longest 2.05 seconds) - Personally Reviewed  ECG    Pending - Personally Reviewed  Physical Exam   GEN: No acute distress.   Neck: No JVD Cardiac: Irreg Irreg, no murmurs, rubs, or gallops.  Respiratory: Clear to auscultation bilaterally. GI: Soft, nontender, non-distended  MS: No edema; No deformity. Neuro:  Nonfocal  Psych: Normal affect   Labs    High Sensitivity Troponin:   Recent Labs  Lab 07/16/19 2314 07/17/19 0106  TROPONINIHS 18* 19*      Chemistry Recent Labs  Lab 07/17/19 0106 07/18/19 0119 07/19/19 0316  NA 137 136 138  K 3.6 3.8 4.7  CL 95* 97* 100  CO2 31 30 33*  GLUCOSE 116* 112* 103*  BUN 20 23 24*  CREATININE 0.85 0.86 0.87  CALCIUM 9.0 9.1 9.1  PROT 6.7  --   --   ALBUMIN 3.4* 3.2* 3.1*  AST 22  --   --   ALT 19   --   --   ALKPHOS 92  --   --   BILITOT 0.9  --   --   GFRNONAA 59* 58* 57*  GFRAA >60 >60 >60  ANIONGAP 11 9 5      Hematology Recent Labs  Lab 07/17/19 0106 07/18/19 0119 07/19/19 0316  WBC 9.3 9.1 8.9  RBC 4.15 4.23 4.21  HGB 13.0 12.9 13.0  HCT 38.9 39.1 39.3  MCV 93.7 92.4 93.3  MCH 31.3 30.5 30.9  MCHC 33.4 33.0 33.1  RDW 15.0 15.2 15.1  PLT 277 268 214    BNPNo results for input(s): BNP, PROBNP in the last 168 hours.   DDimer No results for input(s): DDIMER in the last 168 hours.   Radiology    ECHOCARDIOGRAM COMPLETE  Result Date: 07/17/2019   ECHOCARDIOGRAM REPORT   Patient Name:   LYNZEE LINDQUIST Date of Exam: 07/17/2019 Medical Rec #:  09/14/2019    Height:       63.0 in Accession #:    [id number]   Weight:       146.0 lb Date of Birth:  March 30, 1923  BSA:          1.69 m Patient Age:    86 years     BP:           123/98 mmHg Patient Gender: F            HR:           129 bpm. Exam Location:  Inpatient Procedure: 2D Echo                             MODIFIED REPORT: This report was modified by Kirk Ruths MD on 07/17/2019 due to Change.  Indications:     Atrial Fibrillation 427.31 / I48.91  History:         Patient has prior history of Echocardiogram examinations, most                  recent 11/15/2015. Arrythmias:Tachycardia,                  Signs/Symptoms:Hypotension and Shortness of Breath; Risk                  Factors:Hypertension and Dyslipidemia.  Sonographer:     Jaquita Folds Referring Phys:  [place] Diagnosing Phys: Kirk Ruths MD IMPRESSIONS  1. Left ventricular ejection fraction, by visual estimation, is 50 to 55%. The left ventricle has normal function. There is no left ventricular hypertrophy.  2. Left ventricular diastolic function could not be evaluated.  3. The left ventricle has no regional wall motion abnormalities.  4. Global right ventricle has normal systolic function.The right ventricular size is mildly enlarged.  5. Left atrial size was  mildly dilated.  6. Right atrial size was severely dilated.  7. The mitral valve is normal in structure. Mild mitral valve regurgitation. No evidence of mitral stenosis.  8. The tricuspid valve is normal in structure.  9. The aortic valve is tricuspid. Aortic valve regurgitation is not visualized. Mild aortic valve stenosis. 10. The pulmonic valve was normal in structure. Pulmonic valve regurgitation is not visualized. 11. Mildly elevated pulmonary artery systolic pressure. 12. The inferior vena cava is normal in size with greater than 50% respiratory variability, suggesting right atrial pressure of 3 mmHg. 13. Normal LV systolic function; biatrial enlargement; mild RVE; calcified aortic valve with mild AS (mean gradient 11 mmHg; visually appears moderate); mild MR; moderate TR. FINDINGS  Left Ventricle: Left ventricular ejection fraction, by visual estimation, is 50 to 55%. The left ventricle has normal function. The left ventricle has no regional wall motion abnormalities. There is no left ventricular hypertrophy. The left ventricular diastology could not be evaluated due to atrial fibrillation. Left ventricular diastolic function could not be evaluated. Normal left atrial pressure. Right Ventricle: The right ventricular size is mildly enlarged. Global RV systolic function is has normal systolic function. The tricuspid regurgitant velocity is 2.92 m/s, and with an assumed right atrial pressure of 3 mmHg, the estimated right ventricular systolic pressure is mildly elevated at 37.1 mmHg. Left Atrium: Left atrial size was mildly dilated. Right Atrium: Right atrial size was severely dilated Pericardium: There is no evidence of pericardial effusion. Mitral Valve: The mitral valve is normal in structure. Mild mitral valve regurgitation. No evidence of mitral valve stenosis by observation. Tricuspid Valve: The tricuspid valve is normal in structure. Tricuspid valve regurgitation moderate. Aortic Valve: The aortic valve  is tricuspid. Aortic valve regurgitation is not visualized. Mild aortic stenosis is present.  Aortic valve mean gradient measures 11.2 mmHg. Aortic valve peak gradient measures 18.9 mmHg. Aortic valve area, by VTI measures 0.89 cm. Pulmonic Valve: The pulmonic valve was normal in structure. Pulmonic valve regurgitation is not visualized. Pulmonic regurgitation is not visualized. Aorta: The aortic root is normal in size and structure. Venous: The inferior vena cava is normal in size with greater than 50% respiratory variability, suggesting right atrial pressure of 3 mmHg. IAS/Shunts: No atrial level shunt detected by color flow Doppler. Additional Comments: Normal LV systolic function; biatrial enlargement; mild RVE; calcified aortic valve with mild AS (mean gradient 11 mmHg; visually appears moderate); mild MR; moderate TR.  LEFT VENTRICLE PLAX 2D LVIDd:         3.40 cm LVIDs:         2.90 cm LV PW:         0.90 cm LV IVS:        0.90 cm LVOT diam:     1.80 cm LV SV:         15 ml LV SV Index:   8.80 LVOT Area:     2.54 cm  RIGHT VENTRICLE RV S prime:     8.05 cm/s TAPSE (M-mode): 1.2 cm LEFT ATRIUM             Index       RIGHT ATRIUM           Index LA diam:        4.70 cm 2.78 cm/m  RA Area:     23.90 cm LA Vol (A2C):   46.5 ml 27.49 ml/m RA Volume:   72.40 ml  42.80 ml/m LA Vol (A4C):   52.8 ml 31.21 ml/m LA Biplane Vol: 49.6 ml 29.32 ml/m  AORTIC VALVE AV Area (Vmax):    0.89 cm AV Area (Vmean):   0.86 cm AV Area (VTI):     0.89 cm AV Vmax:           217.40 cm/s AV Vmean:          157.000 cm/s AV VTI:            0.373 m AV Peak Grad:      18.9 mmHg AV Mean Grad:      11.2 mmHg LVOT Vmax:         76.13 cm/s LVOT Vmean:        53.100 cm/s LVOT VTI:          0.130 m LVOT/AV VTI ratio: 0.35  AORTA Ao Root diam: 2.70 cm TRICUSPID VALVE TR Peak grad:   34.1 mmHg TR Vmax:        292.00 cm/s  SHUNTS Systemic VTI:  0.13 m Systemic Diam: 1.80 cm  Olga Millers MD Electronically signed by Olga Millers MD  Signature Date/Time: 07/17/2019/2:34:47 PM    Final (Updated)     Cardiac Studies   Echo 07/17/19  1. Left ventricular ejection fraction, by visual estimation, is 50 to 55%. The left ventricle has normal function. There is no left ventricular hypertrophy.  2. Left ventricular diastolic function could not be evaluated.  3. The left ventricle has no regional wall motion abnormalities.  4. Global right ventricle has normal systolic function.The right ventricular size is mildly enlarged.  5. Left atrial size was mildly dilated.  6. Right atrial size was severely dilated.  7. The mitral valve is normal in structure. Mild mitral valve regurgitation. No evidence of mitral stenosis.  8. The tricuspid valve is normal  in structure.  9. The aortic valve is tricuspid. Aortic valve regurgitation is not visualized. Mild aortic valve stenosis. 10. The pulmonic valve was normal in structure. Pulmonic valve regurgitation is not visualized. 11. Mildly elevated pulmonary artery systolic pressure. 12. The inferior vena cava is normal in size with greater than 50% respiratory variability, suggesting right atrial pressure of 3 mmHg. 13. Normal LV systolic function; biatrial enlargement; mild RVE; calcified aortic valve with mild AS (mean gradient 11 mmHg; visually appears moderate); mild MR; moderate TR.   Patient Profile     86 y.o. female with h/o of permanent afib on coumadin who was admitted with back pain and weakness being seen for afib RVR.  Assessment & Plan    Permanent Afib, now with RVR - Patient found to be in afib RVR and started on IV dilt - HS troponin flat - unable to tolerate amiodarone (caused shaking) - At home patient was on Cardizem 120mg  daily and Lopressor 25 mg daily - Echo showed EF50-55%, mild AS - CHADSVASC 5 - Has been difficult to manage rate given intolerance to meds and hypotension - Continue IV dilt. It is at 5mg /hr - Lopressor 37.5 mg BID>>continue - continue warfarin -  Pressures continue to be occasionally soft but appears rates are improved.   Chronic systolic and diastolic HF - Home Lasix 40 mg held for hypotension - EF only mildly reduced - Not a candidate for invasive procedure - Continue BB  UTI - per IM   For questions or updates, please contact [facility] Please consult [email or web address] for contact info under        Signed, Cadence , PA-C  07/19/2019, 7:01 AM    Patient seen and examined with Cadence Furth PA-C.  Agree as above, with the following exceptions and changes as noted below.She continues to have elevated rates in afib after stopping IV diltiazem. Would consider amiodarone however she has an intolerance. Gen: appears mildly unwell with fatigue, CV: irregular rhythm tachycardic, no murmurs, Lungs: clear, Abd: soft, Extrem: Warm, well perfused, no edema, Neuro/Psych: alert and oriented x 3, normal mood and affect. All available labs, radiology testing, previous records reviewed. Her rates remain elevated with soft pressures. We will resume IV diltiazem overnight and consider transition to oral tomorrow. Not an optimal cardioversion candidate given severely dilated atria. Completed abx for UTI, we will reassess rates tomorrow.  David Stall

## 2019-07-19 NOTE — Evaluation (Addendum)
Physical Therapy Evaluation Patient Details Name: Kendra Hughes MRN: [id number] DOB: 1923-01-26 Today's Date: 07/19/2019   History of Present Illness  Patient is a 86 y/o female who presents from PCP due to A-fib wtih RVR. PMH includes left hemiarthoplasty, RA, PAF, HTN, DVT, CHF.  Clinical Impression  Patient presents with generalized weakness, decreased activity tolerance, impaired balance and impaired mobility s/p above. Pt reports being Mod I PTA, using RW and caring for self as well as doing IADLs. Today, pt requires Min A for bed mobility due to hx of back fractures and mod A to stand from EOB with heavy posterior lean. Able to side step along side bed with Mod A for support and RW. Marked weakness in BLEs. Pt in A-fib with HR ranging from 120-162 bpm. Fatigues quickly. Education re: back precautions and log roll technique due to hx of back fxs per report. Pt lives with elderly spouse and not sure the level of physical assist spouse can provide. Would benefit from SNF to maximize independence and mobility prior to return home. Will follow acutely.    Follow Up Recommendations SNF    Equipment Recommendations  Other (comment)(defer)    Recommendations for Other Services       Precautions / Restrictions Precautions Precautions: Fall Precaution Comments: watch HR, Afib Restrictions Weight Bearing Restrictions: No      Mobility  Bed Mobility Overal bed mobility: Needs Assistance Bed Mobility: Supine to Sit;Sit to Supine     Supine to sit: Mod assist;HOB elevated Sit to supine: Min assist;HOB elevated   General bed mobility comments: Despite cues for log roll technique, pt showing PT how she gets OOB at home, pushing behind back and getting into long sitting to get to EOB, assist with LEs and trunk.  Transfers Overall transfer level: Needs assistance Equipment used: Rolling walker (2 wheeled) Transfers: Sit to/from Stand Sit to Stand: Mod assist         General transfer  comment: ASsist to power to standing with use of momentum x2 from EOB; posterior bias and lean. HR limiting mobility.  Ambulation/Gait Ambulation/Gait assistance: Mod assist Gait Distance (Feet): 3 Feet Assistive device: Rolling walker (2 wheeled) Gait Pattern/deviations: Trunk flexed;Leaning posteriorly     General Gait Details: Able to take a few steps along side bed with Mod A due to posterior lean and BLEs being supported by rail.  Stairs            Wheelchair Mobility    Modified Rankin (Stroke Patients Only)       Balance Overall balance assessment: Needs assistance Sitting-balance support: Feet supported;No upper extremity supported Sitting balance-Leahy Scale: Fair     Standing balance support: During functional activity Standing balance-Leahy Scale: Poor Standing balance comment: Requires UE support and external support due to posterior lean and weakness.                             Pertinent Vitals/Pain Pain Assessment: Faces Faces Pain Scale: Hurts little more Pain Location: back Pain Descriptors / Indicators: Grimacing;Guarding;Sore;Aching Pain Intervention(s): Repositioned;Monitored during session;Limited activity within patient's tolerance    Home Living Family/patient expects to be discharged to:: Private residence Living Arrangements: Spouse/significant other Available Help at Discharge: Family;Available 24 hours/day Type of Home: House Home Access: Stairs to enter Entrance Stairs-Rails: Left Entrance Stairs-Number of Steps: 1 Home Layout: One level Home Equipment: Walker - 2 wheels;Grab bars - tub/shower;Shower seat - built in;Bedside commode;Wheelchair - manual  Prior Function Level of Independence: Independent with assistive device(s)         Comments: Uses RW for ambulation. Does own ADLs. Does cooking/cleaning, up until the last 2 weeks.     Hand Dominance   Dominant Hand: Right    Extremity/Trunk Assessment    Upper Extremity Assessment Upper Extremity Assessment: Defer to OT evaluation    Lower Extremity Assessment Lower Extremity Assessment: Generalized weakness(RLE>LLE)    Cervical / Trunk Assessment Cervical / Trunk Assessment: Kyphotic  Communication   Communication: No difficulties  Cognition Arousal/Alertness: Awake/alert Behavior During Therapy: WFL for tasks assessed/performed Overall Cognitive Status: Within Functional Limits for tasks assessed                                        General Comments General comments (skin integrity, edema, etc.): HR ranged from 120-163 bpm A-fib.    Exercises     Assessment/Plan    PT Assessment Patient needs continued PT services  PT Problem List Decreased strength;Decreased mobility;Pain;Decreased balance;Decreased activity tolerance;Cardiopulmonary status limiting activity       PT Treatment Interventions Therapeutic activities;Gait training;Therapeutic exercise;Patient/family education;Balance training;Functional mobility training;Wheelchair mobility training;DME instruction    PT Goals (Current goals can be found in the Care Plan section)  Acute Rehab PT Goals Patient Stated Goal: to get stronger PT Goal Formulation: With patient Time For Goal Achievement: 08/02/19 Potential to Achieve Goals: Good    Frequency Min 2X/week   Barriers to discharge Decreased caregiver support elderly spouse    Co-evaluation               AM-PAC PT "6 Clicks" Mobility  Outcome Measure Help needed turning from your back to your side while in a flat bed without using bedrails?: A Little Help needed moving from lying on your back to sitting on the side of a flat bed without using bedrails?: A Lot Help needed moving to and from a bed to a chair (including a wheelchair)?: A Lot Help needed standing up from a chair using your arms (e.g., wheelchair or bedside chair)?: A Lot Help needed to walk in hospital room?: A Lot Help  needed climbing 3-5 steps with a railing? : Total 6 Click Score: 12    End of Session Equipment Utilized During Treatment: Gait belt Activity Tolerance: Treatment limited secondary to medical complications (Comment)(HR) Patient left: in bed;with call bell/phone within reach;with bed alarm set Nurse Communication: Mobility status;Other (comment)(need for purewick to be placed) PT Visit Diagnosis: Pain;Difficulty in walking, not elsewhere classified (R26.2);Muscle weakness (generalized) (M62.81);Unsteadiness on feet (R26.81) Pain - part of body: (back)    Time: 1093-2355 PT Time Calculation (min) (ACUTE ONLY): 23 min   Charges:   PT Evaluation $PT Eval Moderate Complexity: 1 Mod PT Treatments $Therapeutic Activity: 8-22 mins        Vale Haven, PT, DPT Acute Rehabilitation Services Pager [phone] Office [phone]      Blake Divine A Lanier Ensign 07/19/2019, 4:26 PM

## 2019-07-20 ENCOUNTER — Telehealth: Payer: Self-pay | Admitting: Internal Medicine

## 2019-07-20 LAB — PROTIME-INR
INR: 3.2 — ABNORMAL HIGH (ref 0.8–1.2)
Prothrombin Time: 32.7 seconds — ABNORMAL HIGH (ref 11.4–15.2)

## 2019-07-20 LAB — RENAL FUNCTION PANEL
Albumin: 3 g/dL — ABNORMAL LOW (ref 3.5–5.0)
Anion gap: 7 (ref 5–15)
BUN: 26 mg/dL — ABNORMAL HIGH (ref 8–23)
CO2: 31 mmol/L (ref 22–32)
Calcium: 9.3 mg/dL (ref 8.9–10.3)
Chloride: 101 mmol/L (ref 98–111)
Creatinine, Ser: 0.88 mg/dL (ref 0.44–1.00)
GFR calc Af Amer: >60 mL/min (ref >60)
GFR calc non Af Amer: 56 mL/min — ABNORMAL LOW (ref >60)
Glucose, Bld: 134 mg/dL — ABNORMAL HIGH (ref 70–99)
Phosphorus: 3.5 mg/dL (ref 2.5–4.6)
Potassium: 4.3 mmol/L (ref 3.5–5.1)
Sodium: 139 mmol/L (ref 135–145)

## 2019-07-20 LAB — CBC WITH DIFFERENTIAL/PLATELET
Abs Immature Granulocytes: 0.03 10*3/uL (ref 0.00–0.07)
Basophils Absolute: 0 10*3/uL (ref 0.0–0.1)
Basophils Relative: 0 %
Eosinophils Absolute: 0.1 10*3/uL (ref 0.0–0.5)
Eosinophils Relative: 1 %
HCT: 39.5 % (ref 36.0–46.0)
Hemoglobin: 12.7 g/dL (ref 12.0–15.0)
Immature Granulocytes: 0 %
Lymphocytes Relative: 7 %
Lymphs Abs: 0.8 10*3/uL (ref 0.7–4.0)
MCH: 30.5 pg (ref 26.0–34.0)
MCHC: 32.2 g/dL (ref 30.0–36.0)
MCV: 95 fL (ref 80.0–100.0)
Monocytes Absolute: 0.8 10*3/uL (ref 0.1–1.0)
Monocytes Relative: 7 %
Neutro Abs: 9.3 10*3/uL — ABNORMAL HIGH (ref 1.7–7.7)
Neutrophils Relative %: 85 %
Platelets: 269 10*3/uL (ref 150–400)
RBC: 4.16 MIL/uL (ref 3.87–5.11)
RDW: 15.2 % (ref 11.5–15.5)
WBC: 11 10*3/uL — ABNORMAL HIGH (ref 4.0–10.5)
nRBC: 0 % (ref 0.0–0.2)

## 2019-07-20 LAB — MAGNESIUM: Magnesium: 2.2 mg/dL (ref 1.7–2.4)

## 2019-07-20 MED ORDER — DILTIAZEM HCL-DEXTROSE 125-5 MG/125ML-% IV SOLN (PREMIX)
5.0000 mg/h | INTRAVENOUS | Status: DC
  Administered 2019-07-20 – 2019-07-22 (×2): 5 mg/h via INTRAVENOUS
  Filled 2019-07-20 (×4): qty 125

## 2019-07-20 MED ORDER — WARFARIN 0.5 MG HALF TABLET
0.5000 mg | ORAL_TABLET | Freq: Once | ORAL | Status: AC
  Administered 2019-07-20: 18:00:00 0.5 mg via ORAL
  Filled 2019-07-20: qty 1

## 2019-07-20 MED ORDER — DILTIAZEM HCL ER COATED BEADS 120 MG PO CP24
120.0000 mg | ORAL_CAPSULE | Freq: Every day | ORAL | Status: DC
  Administered 2019-07-20: 10:00:00 120 mg via ORAL
  Filled 2019-07-20: qty 1

## 2019-07-20 NOTE — Progress Notes (Signed)
Kendra Kitchen  PROGRESS NOTE    Kendra Hughes  TOI:[id number] DOB: [birth year] DOA: 07/16/2019 PCP: Carmin Richmond, MD   Brief Narrative:   Kendra Hughes a89 y.o.female,w rheumatoid arthritis, hypertension, hyperlipidemia, CHF (diastolic), hx of LAA thrombus, Pafib (Chadsvasc 5, h/o remote DVT, s/p L hip fracture 2017, s/p L5 compression fracture, apparently presents with fatigue and dyspnea for the past 2-4 weeks. Pt was noted to have Afib with RVR yesterday and seen in ER in [facility] and given iv metoprolol w some improvement and sent home. Apparently seen by PCP this morning and had Afib w RVR and sent to [facility] ER for evaluation.  07/20/19: HR 130 - 150 during interview. She seems fatigued. Says back is ok on tramadol. PT rec is for SNF. I talked with son to update. Says he mobility decline has been over 6 months but much more sharply in the last month. Appreciate cards assistance with a fib.    Assessment & Plan:   Principal Problem:   Atrial fibrillation with RVR (HCC) Active Problems:   HTN (hypertension)   Hyperlipidemia   Hyponatremia   Hypokalemia  Afib with RVR - on dilt gtt; continuing home metoprolol - followed by cards; has had difficulty with amiodarone and dig in the past - cards consulted, spoke with Dr. Antoine Poche, appreciate assistance - echo: EF 50-55%, RA/LA severely dilated - mildly elevated trp; denies CP - pharm dosing coumadin - appreciate cards help: dilt PO, metoprolol, coumadin     - rates up today during interview (130 - 150s)  Hypokalemia Hyponatremia - labs ok. Monitor.  Acute lower UTI (citrobacter freundi) - UCx positive for citrobacter - currently on rocephin; reports burning with urination, will continue abx for now, but will change to cefdinir - she has completed her abx; WBC up, but she is a febrile and aSx; monitor  chronic diastolic HF - continue home lasix; fluid status ok, monitor  Chronic  back pain - long history with back Fx, she says she's had multiple visits to the spine center for her back pain; son confirms - PRNs available - PT/OT eval noted; updated son, he will speak with pt  DVT prophylaxis: coumadin Code Status: FULL Family Communication: With son by phone   Disposition Plan: Needs better HR control. Rec for SNF.  Consultants:   Cardiology   ROS:  Denies CP, ab pain, N, V. Remainder 10-pt ROS is negative for all not previously mentioned.  Subjective: She seems a little down this morning. Reports that her back is ok, but not encouraged by therapy work.  Objective: Vitals:   07/20/19 0909 07/20/19 0917 07/20/19 0929 07/20/19 0936  BP: (!) 109/91   (!) 112/95  Pulse: 71 96 94 (!) 39  Resp: 18  (!) 23 (!) 21  Temp:  98.1 F (36.7 C)    TempSrc:  Oral    SpO2: 95%  99% 96%  Weight:      Height:        Intake/Output Summary (Last 24 hours) at 07/20/2019 1127 Last data filed at 07/20/2019 0900 Gross per 24 hour  Intake 238 ml  Output -  Net 238 ml   Filed Weights   07/17/19 0422 07/18/19 0436 07/20/19 0552  Weight: 66.2 kg 68.7 kg 69.8 kg    Examination:  General: 86 y.o. female resting in bed in NAD Cardiovascular: tachy, irregular, +S1, S2, no m/g/r Respiratory: CTABL, no w/r/r, normal WOB GI: BS+, NDNT, soft, no masses noted, no organomegaly noted MSK: No  e/c/c Neuro: alert to name, follows commands Psyc: calm/cooperative   Data Reviewed: I have personally reviewed following labs and imaging studies.  CBC: Recent Labs  Lab 07/16/19 1717 07/17/19 0106 07/18/19 0119 07/19/19 0316 07/20/19 0424  WBC 10.2 9.3 9.1 8.9 11.0*  NEUTROABS  --   --  7.3 7.3 9.3*  HGB 14.0 13.0 12.9 13.0 12.7  HCT 42.2 38.9 39.1 39.3 39.5  MCV 93.4 93.7 92.4 93.3 95.0  PLT 296 277 268 214 269   Basic Metabolic Panel: Recent Labs  Lab 07/16/19 1717 07/17/19 0106 07/18/19 0119 07/19/19 0316 07/20/19 0424  NA 133* 137 136 138 139  K  3.2* 3.6 3.8 4.7 4.3  CL 92* 95* 97* 100 101  CO2 29 31 30  33* 31  GLUCOSE 107* 116* 112* 103* 134*  BUN 25* 20 23 24* 26*  CREATININE 0.89 0.85 0.86 0.87 0.88  CALCIUM 9.3 9.0 9.1 9.1 9.3  MG  --  2.2 2.1 2.2 2.2  PHOS  --   --  3.6 3.3 3.5   GFR: Estimated Creatinine Clearance: 36.7 mL/min (by C-G formula based on SCr of 0.88 mg/dL). Liver Function Tests: Recent Labs  Lab 07/17/19 0106 07/18/19 0119 07/19/19 0316 07/20/19 0424  AST 22  --   --   --   ALT 19  --   --   --   ALKPHOS 92  --   --   --   BILITOT 0.9  --   --   --   PROT 6.7  --   --   --   ALBUMIN 3.4* 3.2* 3.1* 3.0*   No results for input(s): LIPASE, AMYLASE in the last 168 hours. No results for input(s): AMMONIA in the last 168 hours. Coagulation Profile: Recent Labs  Lab 07/16/19 1840 07/17/19 0106 07/18/19 0119 07/19/19 0316 07/20/19 0424  INR 2.2* 2.2* 2.3* 2.5* 3.2*   Cardiac Enzymes: No results for input(s): CKTOTAL, CKMB, CKMBINDEX, TROPONINI in the last 168 hours. BNP (last 3 results) No results for input(s): PROBNP in the last 8760 hours. HbA1C: No results for input(s): HGBA1C in the last 72 hours. CBG: No results for input(s): GLUCAP in the last 168 hours. Lipid Profile: No results for input(s): CHOL, HDL, LDLCALC, TRIG, CHOLHDL, LDLDIRECT in the last 72 hours. Thyroid Function Tests: No results for input(s): TSH, T4TOTAL, FREET4, T3FREE, THYROIDAB in the last 72 hours. Anemia Panel: No results for input(s): VITAMINB12, FOLATE, FERRITIN, TIBC, IRON, RETICCTPCT in the last 72 hours. Sepsis Labs: No results for input(s): PROCALCITON, LATICACIDVEN in the last 168 hours.  Recent Results (from the past 240 hour(s))  Respiratory Panel by RT PCR (Flu A&B, Covid) -     Status: None   Collection Time: 07/16/19  8:27 PM  Result Value Ref Range Status   SARS Coronavirus 2 by RT PCR NEGATIVE NEGATIVE Final    Comment: (NOTE) SARS-CoV-2 target nucleic acids are NOT DETECTED. The SARS-CoV-2 RNA  is generally detectable in upper respiratoy specimens during the acute phase of infection. The lowest concentration of SARS-CoV-2 viral copies this assay can detect is 131 copies/mL. A negative result does not preclude SARS-Cov-2 infection and should not be used as the sole basis for treatment or other patient management decisions. A negative result may occur with  improper specimen collection/handling, submission of specimen other than nasopharyngeal swab, presence of viral mutation(s) within the areas targeted by this assay, and inadequate number of viral copies (<131 copies/mL). A negative result must be combined with  clinical observations, patient history, and epidemiological information. The expected result is Negative. Fact Sheet for Patients:  PinkCheek.be Fact Sheet for Healthcare Providers:  GravelBags.it This test is not yet ap proved or cleared by the [place] FDA and  has been authorized for detection and/or diagnosis of SARS-CoV-2 by FDA under an Emergency Use Authorization (EUA). This EUA will remain  in effect (meaning this test can be used) for the duration of the COVID-19 declaration under Section 564(b)(1) of the Act, 21 U.S.C. section 360bbb-3(b)(1), unless the authorization is terminated or revoked sooner.    Influenza A by PCR NEGATIVE NEGATIVE Final   Influenza B by PCR NEGATIVE NEGATIVE Final    Comment: (NOTE) The Xpert Xpress SARS-CoV-2/FLU/RSV assay is intended as an aid in  the diagnosis of influenza from Nasopharyngeal swab specimens and  should not be used as a sole basis for treatment. Nasal washings and  aspirates are unacceptable for Xpert Xpress SARS-CoV-2/FLU/RSV  testing. Fact Sheet for Patients: PinkCheek.be Fact Sheet for Healthcare Providers: GravelBags.it This test is not yet approved or cleared by the [place] FDA and   has been authorized for detection and/or diagnosis of SARS-CoV-2 by  FDA under an Emergency Use Authorization (EUA). This EUA will remain  in effect (meaning this test can be used) for the duration of the  Covid-19 declaration under Section 564(b)(1) of the Act, 21  U.S.C. section 360bbb-3(b)(1), unless the authorization is  terminated or revoked. Performed at [facility], [place] [address], [place], [place] [address]       Radiology Studies: No results found.   Scheduled Meds: . diltiazem  120 mg Oral Daily  . metoprolol tartrate  37.5 mg Oral BID  . Warfarin - Pharmacist Dosing Inpatient   Does not apply q1800   Continuous Infusions:   LOS: 4 days    Time spent: 25 minutes spent in the coordination of care today.    Jonnie Finner, DO [facility]  If 7PM-7AM, please contact night-coverage [email or web address] 07/20/2019, 11:27 AM

## 2019-07-20 NOTE — Care Management Important Message (Signed)
Important Message  Patient Details  Name: Kendra Hughes MRN: [id number] Date of Birth: 18-Dec-1922   Medicare Important Message Given:  Yes     Renie Ora 07/20/2019, 11:25 AM

## 2019-07-20 NOTE — Evaluation (Signed)
Occupational Therapy Evaluation Patient Details Name: Kendra Hughes MRN: [id number] DOB: July 07, 1923 Today's Date: 07/20/2019    History of Present Illness Patient is a 86 y/o female who presents from PCP due to A-fib wtih RVR. PMH includes left hemiarthoplasty, RA, PAF, HTN, DVT, CHF.   Clinical Impression   Patient is a 87 year old female that lives with her spouse in a single level home with 1 step to enter. Patient reports her spouse helps "sometimes, if I need him to" with lower body dressing, but otherwise performs own self care and uses rollator for functional ambulation. Currently patient requires mod A to stand from recliner, patient has posterior loss of balance when she attempts to take step forward requiring mod A for safety backing up to recliner and for eccentric control into chair. Seated in chair at rest patient's HR between 120-130. In standing patient HR up to 170s requiring increased time in sitting to return to low 120s. Recommend continued acute OT services to maximize patient safety and independence with self care.    Follow Up Recommendations  SNF;Supervision/Assistance - 24 hour    Equipment Recommendations  None recommended by OT       Precautions / Restrictions Precautions Precautions: Fall Precaution Comments: watch HR, Afib Restrictions Weight Bearing Restrictions: No      Mobility Bed Mobility               General bed mobility comments: seated in chair upon arrival  Transfers Overall transfer level: Needs assistance Equipment used: Rolling walker (2 wheeled) Transfers: Sit to/from Stand Sit to Stand: Mod assist         General transfer comment: assist to power to standing from recliner; posterior bias and lean. HR limiting mobility.    Balance Overall balance assessment: Needs assistance Sitting-balance support: Feet supported;No upper extremity supported Sitting balance-Leahy Scale: Fair   Postural control: Posterior lean Standing  balance support: Bilateral upper extremity supported;During functional activity Standing balance-Leahy Scale: Poor Standing balance comment: Requires UE support and external support due to posterior lean and weakness.                           ADL either performed or assessed with clinical judgement   ADL Overall ADL's : Needs assistance/impaired Eating/Feeding: Sitting;Independent   Grooming: Set up;Sitting   Upper Body Bathing: Set up;Sitting   Lower Body Bathing: Maximal assistance;Sitting/lateral leans   Upper Body Dressing : Set up;Sitting   Lower Body Dressing: Maximal assistance;Sitting/lateral leans Lower Body Dressing Details (indicate cue type and reason): total A to doff hospital socks, min/mod A to don shoes Toilet Transfer: Moderate assistance;Cueing for safety;Cueing for sequencing;BSC;RW Toilet Transfer Details (indicate cue type and reason): simulated sit <> stand from recliner. mod A to stand with posterior loss of balance initiating stepping forward and backing up to chair.  Toileting- Clothing Manipulation and Hygiene: Maximal assistance;Total assistance;Sitting/lateral lean;Sit to/from stand       Functional mobility during ADLs: Moderate assistance;Rolling walker;Cueing for sequencing;Cueing for safety;+2 for safety/equipment;+2 for physical assistance(recommend x2 for safety due to limited balance) General ADL Comments: increased assistance due to HR increase with minimal mobility, weakness, decreased balance                  Pertinent Vitals/Pain Pain Assessment: No/denies pain     Hand Dominance Right   Extremity/Trunk Assessment Upper Extremity Assessment Upper Extremity Assessment: Generalized weakness   Lower Extremity Assessment Lower Extremity Assessment:  Defer to PT evaluation   Cervical / Trunk Assessment Cervical / Trunk Assessment: Kyphotic   Communication Communication Communication: No difficulties   Cognition  Arousal/Alertness: Awake/alert Behavior During Therapy: WFL for tasks assessed/performed Overall Cognitive Status: Within Functional Limits for tasks assessed                                 General Comments: patient does appear to have limited insight into deficits, OT tries to explain it's taking 2 people to get her up to which patient responds "it doesn't at home"   General Comments  HR in chair at rest between 120-130, HR up to 170 with standing from chair and taking step forward/back            Home Living Family/patient expects to be discharged to:: Private residence Living Arrangements: Spouse/significant other Available Help at Discharge: Family;Available 24 hours/day Type of Home: House Home Access: Stairs to enter [employer] of Steps: 1 Entrance Stairs-Rails: Left Home Layout: One level     Bathroom Shower/Tub: Producer, television/film/video: Handicapped height Bathroom Accessibility: Yes How Accessible: Accessible via walker Home Equipment: Walker - 2 wheels;Grab bars - tub/shower;Shower seat - built in;Bedside commode;Wheelchair - manual          Prior Functioning/Environment Level of Independence: Independent with assistive device(s)        Comments: Uses RW for ambulation. Does own ADLs. Does cooking/cleaning, up until the last 2 weeks.        OT Problem List: Decreased strength;Decreased activity tolerance;Impaired balance (sitting and/or standing);Decreased safety awareness      OT Treatment/Interventions: Self-care/ADL training;Therapeutic exercise;Energy conservation;DME and/or AE instruction;Therapeutic activities;Patient/family education;Balance training    OT Goals(Current goals can be found in the care plan section) Acute Rehab OT Goals Patient Stated Goal: to get stronger OT Goal Formulation: With patient Time For Goal Achievement: 08/03/19 Potential to Achieve Goals: Good  OT Frequency: Min 2X/week    AM-PAC  OT "6 Clicks" Daily Activity     Outcome Measure Help from another person eating meals?: None Help from another person taking care of personal grooming?: A Little Help from another person toileting, which includes using toliet, bedpan, or urinal?: A Lot Help from another person bathing (including washing, rinsing, drying)?: A Lot Help from another person to put on and taking off regular upper body clothing?: A Little Help from another person to put on and taking off regular lower body clothing?: A Lot 6 Click Score: 16   End of Session Equipment Utilized During Treatment: Rolling walker Nurse Communication: Mobility status  Activity Tolerance: Treatment limited secondary to medical complications (Comment)(HR) Patient left: in chair;with call bell/phone within reach;with chair alarm set  OT Visit Diagnosis: Unsteadiness on feet (R26.81);Other abnormalities of gait and mobility (R26.89);Muscle weakness (generalized) (M62.81)                Time: 1037-1100 OT Time Calculation (min): 23 min Charges:  OT General Charges $OT Visit: 1 Visit OT Evaluation $OT Eval Moderate Complexity: 1 Mod OT Treatments $Self Care/Home Management : 8-22 mins  Myrtie Neither OT OT office: [phone]  Carmelia Roller 07/20/2019, 12:52 PM

## 2019-07-20 NOTE — Telephone Encounter (Signed)
Patient's daughter in law is calling because the patient is to go to a skilled nursing facility for physical therapy. Patient's daughter in law states that the family would like their mother to stay there that way she has care for heart and physical therapy. She states that due to insurance, this may not be possible. In the past, Dr. Rennis Golden wrote a note for the patient whenever she broke her hip to have full time care and was able to get insurance to approve this. The family would like another note written to approve the move to full time care at a skilled nursing home. She would like to know if Dr. Rennis Golden has time tomorrow to meet with the patients two sons to go over this plan. Please advise.   Contact the son, Loistine Chance.

## 2019-07-20 NOTE — Telephone Encounter (Signed)
Spoke to son Aneta Mins , Information given per Dr Rennis Golden. Son is aware will need to contact doctors in the hospital.  Son states he will contact doctors. Son would like for patient to do rehab within  [facility] facility since heart increase with activity.

## 2019-07-20 NOTE — Progress Notes (Addendum)
Progress Note  Patient Name: Kendra Hughes Date of Encounter: 07/20/2019  Primary Cardiologist: Chrystie Nose, MD   Subjective   Overnight rates improved to 80-90s. Patient denies SP or SOB. Does complain of right flank pain.   Inpatient Medications    Scheduled Meds: . metoprolol tartrate  37.5 mg Oral BID  . Warfarin - Pharmacist Dosing Inpatient   Does not apply q1800   Continuous Infusions: . diltiazem (CARDIZEM) infusion Stopped (07/20/19 0554)   PRN Meds: acetaminophen **OR** acetaminophen, ondansetron (ZOFRAN) IV, Polyethyl Glycol-Propyl Glycol, traMADol   Vital Signs    Vitals:   07/20/19 0108 07/20/19 0509 07/20/19 0529 07/20/19 0552  BP: 103/66 116/62    Pulse:  87    Resp:      Temp:   98 F (36.7 C) 97.7 F (36.5 C)  TempSrc:   Oral Oral  SpO2:      Weight:    69.8 kg  Height:        Intake/Output Summary (Last 24 hours) at 07/20/2019 0733 Last data filed at 07/19/2019 2000 Gross per 24 hour  Intake 118 ml  Output --  Net 118 ml   Last 3 Weights 07/20/2019 07/18/2019 07/17/2019  Weight (lbs) 153 lb 14.1 oz 151 lb 7.3 oz 146 lb  Weight (kg) 69.8 kg 68.7 kg 66.225 kg      Telemetry    Afib, rates now 80-90s  Yesterday rates slowly decreased from 130s - Personally Reviewed  ECG    No new - Personally Reviewed  Physical Exam   GEN: No acute distress.   Neck: No JVD Cardiac: Irreg Irreg, no murmurs, rubs, or gallops.  Respiratory: Clear to auscultation bilaterally. GI: Soft, nontender, non-distended  MS: No edema; No deformity. Neuro:  Nonfocal  Psych: Normal affect   Labs    High Sensitivity Troponin:   Recent Labs  Lab 07/16/19 2314 07/17/19 0106  TROPONINIHS 18* 19*      Chemistry Recent Labs  Lab 07/17/19 0106 07/18/19 0119 07/19/19 0316 07/20/19 0424  NA 137 136 138 139  K 3.6 3.8 4.7 4.3  CL 95* 97* 100 101  CO2 31 30 33* 31  GLUCOSE 116* 112* 103* 134*  BUN 20 23 24* 26*  CREATININE 0.85 0.86 0.87 0.88  CALCIUM  9.0 9.1 9.1 9.3  PROT 6.7  --   --   --   ALBUMIN 3.4* 3.2* 3.1* 3.0*  AST 22  --   --   --   ALT 19  --   --   --   ALKPHOS 92  --   --   --   BILITOT 0.9  --   --   --   GFRNONAA 59* 58* 57* 56*  GFRAA >60 >60 >60 >60  ANIONGAP 11 9 5 7      Hematology Recent Labs  Lab 07/18/19 0119 07/19/19 0316 07/20/19 0424  WBC 9.1 8.9 11.0*  RBC 4.23 4.21 4.16  HGB 12.9 13.0 12.7  HCT 39.1 39.3 39.5  MCV 92.4 93.3 95.0  MCH 30.5 30.9 30.5  MCHC 33.0 33.1 32.2  RDW 15.2 15.1 15.2  PLT 268 214 269    BNPNo results for input(s): BNP, PROBNP in the last 168 hours.   DDimer No results for input(s): DDIMER in the last 168 hours.   Radiology    No results found.  Cardiac Studies   Echo 07/17/19 1. Left ventricular ejection fraction, by visual estimation, is 50 to 55%. The left  ventricle has normal function. There is no left ventricular hypertrophy. 2. Left ventricular diastolic function could not be evaluated. 3. The left ventricle has no regional wall motion abnormalities. 4. Global right ventricle has normal systolic function.The right ventricular size is mildly enlarged. 5. Left atrial size was mildly dilated. 6. Right atrial size was severely dilated. 7. The mitral valve is normal in structure. Mild mitral valve regurgitation. No evidence of mitral stenosis. 8. The tricuspid valve is normal in structure. 9. The aortic valve is tricuspid. Aortic valve regurgitation is not visualized. Mild aortic valve stenosis. 10. The pulmonic valve was normal in structure. Pulmonic valve regurgitation is not visualized. 11. Mildly elevated pulmonary artery systolic pressure. 12. The inferior vena cava is normal in size with greater than 50% respiratory variability, suggesting right atrial pressure of 3 mmHg. 13. Normal LV systolic function; biatrial enlargement; mild RVE; calcified aortic valve with mild AS (mean gradient 11 mmHg; visually appears moderate); mild MR; moderate  TR.  Patient Profile     86 y.o. female with h/o of permanent afib on coumadin who was admitted with back pain and weakness being seen for afib RVR.  Assessment & Plan    Permanent Afib, now with RVR - Patient found to be in afib RVR and started on IV dilt - HS troponin flat - unable to tolerate amiodarone (caused shaking) - At home patient was on Cardizem 120mg  daily and Lopressor 25 mg daily - Echo showed EF50-55%, mild AS - CHADSVASC 5 - Has been difficult to manage rate given intolerance to meds and hypotension - Continue IV dilt. Yesterday tried to transition to oral but rates went back up into into the 130s and 140s and patient was restarted on IV dilt. - Rates are now 80-90s. Higher rates are likely being driven by UTI - Lopressor 37.5 mg BID>>continue - continue warfarin - Pressures continue to be intermittently soft.  Chronic systolic and diastolic HF - Home Lasix 40 mg held for hypotension - EF only mildly reduced - Not a candidate for invasive procedure - Patient is euvolemic on exam - Continue BB  UTI - WBC is up today 8.9 > 11.0 - per IM   For questions or updates, please contact [facility] Please consult [email or web address] for contact info under        Signed, Cadence Ninfa Meeker, PA-C  07/20/2019, 7:33 AM    Patient seen and examined with C Furth PAC.  Agree as above, with the following exceptions and changes as noted below. Still very fatigued. Gen: NAD, CV: irregular tachycardia, no murmurs, Lungs: clear, Abd: soft, Extrem: Warm, well perfused, no edema, Neuro/Psych: alert and oriented x 3, normal mood and affect. All available labs, radiology testing, previous records reviewed. Challenging rate control, and not an optimal candidate for rhythm control. Will transition to oral diltiazem tomorrow if rate control continues to improve.   [name] 07/20/19 7:59 PM

## 2019-07-20 NOTE — Telephone Encounter (Signed)
Ms. Kendra Hughes is currently hospitalized and under the care of Dr. Jacques Navy - I'm in the office tomorrow. I would be supportive of whatever care it is determined she needs - would advise family meet with Dr. Jacques Navy and Dr. Delila Pereyra (hospitalist) to finalize this plan before discharge.  Dr Rennis Golden

## 2019-07-20 NOTE — Progress Notes (Signed)
HR 160's. Cardiology paged.

## 2019-07-20 NOTE — Telephone Encounter (Signed)
LM for Kendra Hughes, pts son.

## 2019-07-20 NOTE — Progress Notes (Signed)
ANTICOAGULATION CONSULT NOTE  Pharmacy Consult for warfarin Indication: atrial fibrillation   Vital Signs: Temp: 98.1 F (36.7 C) (01/12 0917) Temp Source: Oral (01/12 0917) BP: 112/95 (01/12 0936) Pulse Rate: 39 (01/12 0936)  Labs: Recent Labs    07/18/19 0119 07/19/19 0316 07/20/19 0424  HGB 12.9 13.0 12.7  HCT 39.1 39.3 39.5  PLT 268 214 269  LABPROT 25.1* 27.2* 32.7*  INR 2.3* 2.5* 3.2*  CREATININE 0.86 0.87 0.88    Estimated Creatinine Clearance: 36.7 mL/min (by C-G formula based on SCr of 0.88 mg/dL).   Medical History: Past Medical History:  Diagnosis Date  . Arthritis    "right knee" (11/10/2015)  . Chronic diastolic (congestive) heart failure (HCC)   . DOE (dyspnea on exertion)   . DVT (deep venous thrombosis) (HCC) "years ago"  . Dyslipidemia   . Fracture of left hip (HCC) 11/09/2015   "fell"  . History of nuclear stress test 10/2009   normal exam  . Hypertension   . PAF (paroxysmal atrial fibrillation) (HCC)   . Rheumatoid arthritis (HCC) 1970   "it's in remission" (11/10/2015)     Assessment: 86 yo female on warfarin prior to admission for hx of AFib with RVR and hx of LAA thrombus. PTA warfarin: 2.5 mg po daily  INR today is supratherapeutic, up from 2.5 to 3.2. Of note, she did not receive a dose of warfarin on 1/9. Hgb stable at 12, plts WNL at 269. Eating 750% of documented meals. Renal function has been stable.   Goal of Therapy:  INR 2-3 Monitor platelets by anticoagulation protocol: Yes   Plan:  -Warfarin 0.5 mg tonight  -Daily INR -Monitor for signs of bleeding    Thank you,   Sherron Monday, PharmD, BCCCP Clinical Pharmacist  Phone: [phone]  Please check AMION for all [facility] Pharmacy phone numbers After 10:00 PM, call Main Pharmacy [phone]

## 2019-07-20 NOTE — Progress Notes (Signed)
Pt transferred from bed to chair. 2 assist with walker to [facility] and chair.

## 2019-07-21 ENCOUNTER — Inpatient Hospital Stay (HOSPITAL_COMMUNITY): Payer: Medicare Other

## 2019-07-21 LAB — CBC
HCT: 40.6 % (ref 36.0–46.0)
Hemoglobin: 13.2 g/dL (ref 12.0–15.0)
MCH: 30.7 pg (ref 26.0–34.0)
MCHC: 32.5 g/dL (ref 30.0–36.0)
MCV: 94.4 fL (ref 80.0–100.0)
Platelets: 285 10*3/uL (ref 150–400)
RBC: 4.3 MIL/uL (ref 3.87–5.11)
RDW: 15.5 % (ref 11.5–15.5)
WBC: 11.8 10*3/uL — ABNORMAL HIGH (ref 4.0–10.5)
nRBC: 0 % (ref 0.0–0.2)

## 2019-07-21 LAB — BASIC METABOLIC PANEL
Anion gap: 10 (ref 5–15)
BUN: 32 mg/dL — ABNORMAL HIGH (ref 8–23)
CO2: 28 mmol/L (ref 22–32)
Calcium: 9.1 mg/dL (ref 8.9–10.3)
Chloride: 99 mmol/L (ref 98–111)
Creatinine, Ser: 0.98 mg/dL (ref 0.44–1.00)
GFR calc Af Amer: 57 mL/min — ABNORMAL LOW (ref >60)
GFR calc non Af Amer: 49 mL/min — ABNORMAL LOW (ref >60)
Glucose, Bld: 120 mg/dL — ABNORMAL HIGH (ref 70–99)
Potassium: 4.1 mmol/L (ref 3.5–5.1)
Sodium: 137 mmol/L (ref 135–145)

## 2019-07-21 LAB — PROTIME-INR
INR: 3.1 — ABNORMAL HIGH (ref 0.8–1.2)
Prothrombin Time: 31.8 seconds — ABNORMAL HIGH (ref 11.4–15.2)

## 2019-07-21 LAB — PROCALCITONIN: Procalcitonin: <0.1 ng/mL

## 2019-07-21 MED ORDER — WARFARIN 0.5 MG HALF TABLET
0.5000 mg | ORAL_TABLET | Freq: Once | ORAL | Status: AC
  Administered 2019-07-21: 17:00:00 0.5 mg via ORAL
  Filled 2019-07-21: qty 1

## 2019-07-21 MED ORDER — FUROSEMIDE 10 MG/ML IJ SOLN
40.0000 mg | Freq: Once | INTRAMUSCULAR | Status: AC
  Administered 2019-07-21: 14:00:00 40 mg via INTRAVENOUS
  Filled 2019-07-21: qty 4

## 2019-07-21 MED ORDER — FUROSEMIDE 40 MG PO TABS
40.0000 mg | ORAL_TABLET | Freq: Every day | ORAL | Status: DC
  Administered 2019-07-21 – 2019-07-23 (×3): 40 mg via ORAL
  Filled 2019-07-21 (×3): qty 1

## 2019-07-21 MED ORDER — LOPERAMIDE HCL 1 MG/7.5ML PO SUSP
2.0000 mg | ORAL | Status: DC | PRN
  Administered 2019-07-21 – 2019-07-23 (×4): 2 mg via ORAL
  Filled 2019-07-21 (×6): qty 15

## 2019-07-21 NOTE — Progress Notes (Signed)
ANTICOAGULATION CONSULT NOTE  Pharmacy Consult for warfarin Indication: atrial fibrillation   Vital Signs: Temp: 98.7 F (37.1 C) (01/13 0445) Temp Source: Oral (01/13 0445) BP: 104/87 (01/13 0915) Pulse Rate: 50 (01/13 0915)  Labs: Recent Labs    07/19/19 0316 07/20/19 0424 07/21/19 0342 07/21/19 0754  HGB 13.0 12.7  --  13.2  HCT 39.3 39.5  --  40.6  PLT 214 269  --  285  LABPROT 27.2* 32.7* 31.8*  --   INR 2.5* 3.2* 3.1*  --   CREATININE 0.87 0.88  --  0.98    Estimated Creatinine Clearance: 32.9 mL/min (by C-G formula based on SCr of 0.98 mg/dL).   Medical History: Past Medical History:  Diagnosis Date  . Arthritis    "right knee" (11/10/2015)  . Chronic diastolic (congestive) heart failure (HCC)   . DOE (dyspnea on exertion)   . DVT (deep venous thrombosis) (HCC) "years ago"  . Dyslipidemia   . Fracture of left hip (HCC) 11/09/2015   "fell"  . History of nuclear stress test 10/2009   normal exam  . Hypertension   . PAF (paroxysmal atrial fibrillation) (HCC)   . Rheumatoid arthritis (HCC) 1970   "it's in remission" (11/10/2015)     Assessment: 86 yo female on warfarin prior to admission for hx of AFib with RVR and hx of LAA thrombus. PTA warfarin: 2.5 mg po daily  INR today is supratherapeutic at 3.1. Of note, she did not receive a dose of warfarin on 1/9. Hgb stable at 13, plts WNL at 185. Eating 25-50% of documented meals. Renal function has been stable.   Goal of Therapy:  INR 2-3 Monitor platelets by anticoagulation protocol: Yes   Plan:  -Warfarin 0.5 mg tonight  -Daily INR -Monitor for signs of bleeding    Thank you,   Sherron Monday, PharmD, BCCCP Clinical Pharmacist  Phone: [phone]  Please check AMION for all [facility] Pharmacy phone numbers After 10:00 PM, call Main Pharmacy [phone]

## 2019-07-21 NOTE — Progress Notes (Addendum)
[name]  PROGRESS NOTE    Kendra Hughes  WEX:[id number] DOB: 1923-03-08 DOA: 07/16/2019 PCP: Carmin Richmond, MD   Brief Narrative:   Kendra Hughes a89 y.o.female,w rheumatoid arthritis, hypertension, hyperlipidemia, CHF (diastolic), hx of LAA thrombus, Pafib (Chadsvasc 5, h/o remote DVT, s/p L hip fracture 2017, s/p L5 compression fracture, apparently presents with fatigue and dyspnea for the past 2-4 weeks. Pt was noted to have Afib with RVR yesterday and seen in ER in [facility] and given iv metoprolol w some improvement and sent home. Apparently seen by PCP this morning and had Afib w RVR and sent to [facility] ER for evaluation.  She is being followed by cardiology who are assisting with rate control. HR 100 - 120 during interview this AM.  She seems fatigued. Says back is ok on tramadol. PT rec is for SNF. I talked with son to update. Says he mobility decline has been over 6 months but much more sharply in the last month. Appreciate cards assistance with a fib.    Assessment & Plan:   Principal Problem:   Atrial fibrillation with RVR (HCC) Active Problems:   HTN (hypertension)   Hyperlipidemia   Hyponatremia   Hypokalemia  Afib with RVR - back on dilt gtt; continuing home metoprolol - followed by cards; has had difficulty with amiodarone and dig in the past - echo: EF 50-55%, RA/LA severely dilated - mildly elevated trp; denies CP - pharm dosing coumadin - appreciate cards help: dilt gtt, metoprolol, coumadin     - rates up today during interview      - checking CXR, Procal due to WBC up and slight cough, could this be worsening Afib  - CXR with small effusion possible left base consolidation, but Procal is normal  so will diurese gently and see if that helps her cough. I don't think she has PNA.  Hypokalemia Hyponatremia - labs ok. Monitor.  Acute lower UTI (citrobacter freundi) - UCx positive for citrobacter - she has completed her abx  Rocephin for UTI, denies any further symptoms; WBC up, but she is a febrile and aSx; monitor  chronic diastolic HF with some fluid overload - continue home lasix 1/13; fluid status ok overall but effusions on CXR 1/13, monitor    - home lasix wasn't restarted until today, will give one time dose IV lasix now  Chronic back pain - long history with back Fx, she says she's had multiple visits to the spine center for her back pain; son confirms - PRNs available - PT/OT eval noted; updated son, he will speak with pt  DVT prophylaxis: coumadin Code Status: FULL Family Communication: With son by phone   Disposition Plan: Needs better HR control. Rec for SNF.  Consultants:   Cardiology   ROS:  Denies CP, ab pain, N, V. Remainder 10-pt ROS is negative for all not previously mentioned.  Subjective: She seems to be doing well this AM, no complaints, son is present in the room.  Denies urinary symptoms or shortness of breath. Says she feels SOB when she "talks a lot" but that this is not abnormal for her, says she has some cough and coughed up some yellow sputum once.  Objective: Vitals:   07/21/19 0316 07/21/19 0415 07/21/19 0445 07/21/19 0915  BP: 114/64 103/70  104/87  Pulse:   72 (!) 50  Resp:   13 20  Temp:   98.7 F (37.1 C)   TempSrc:   Oral   SpO2:  96%  Weight:   70 kg   Height:        Intake/Output Summary (Last 24 hours) at 07/21/2019 1058 Last data filed at 07/21/2019 0900 Gross per 24 hour  Intake 582 ml  Output --  Net 582 ml   Filed Weights   07/18/19 0436 07/20/19 0552 07/21/19 0445  Weight: 68.7 kg 69.8 kg 70 kg    Examination:  General: 86 y.o. female resting in bed in NAD Cardiovascular: tachy, irregular, +S1, S2, no m/g/r Respiratory: CTA but with crackles at bases, no w/r/r, normal WOB GI: BS+, NDNT, soft, no masses noted, no organomegaly noted MSK: No e/c/c Neuro: alert to name, follows commands Psyc: calm/cooperative   Data  Reviewed: I have personally reviewed following labs and imaging studies.  CBC: Recent Labs  Lab 07/17/19 0106 07/18/19 0119 07/19/19 0316 07/20/19 0424 07/21/19 0754  WBC 9.3 9.1 8.9 11.0* 11.8*  NEUTROABS  --  7.3 7.3 9.3*  --   HGB 13.0 12.9 13.0 12.7 13.2  HCT 38.9 39.1 39.3 39.5 40.6  MCV 93.7 92.4 93.3 95.0 94.4  PLT 277 268 214 269 285   Basic Metabolic Panel: Recent Labs  Lab 07/17/19 0106 07/18/19 0119 07/19/19 0316 07/20/19 0424 07/21/19 0754  NA 137 136 138 139 137  K 3.6 3.8 4.7 4.3 4.1  CL 95* 97* 100 101 99  CO2 31 30 33* 31 28  GLUCOSE 116* 112* 103* 134* 120*  BUN 20 23 24* 26* 32*  CREATININE 0.85 0.86 0.87 0.88 0.98  CALCIUM 9.0 9.1 9.1 9.3 9.1  MG 2.2 2.1 2.2 2.2  --   PHOS  --  3.6 3.3 3.5  --    GFR: Estimated Creatinine Clearance: 32.9 mL/min (by C-G formula based on SCr of 0.98 mg/dL). Liver Function Tests: Recent Labs  Lab 07/17/19 0106 07/18/19 0119 07/19/19 0316 07/20/19 0424  AST 22  --   --   --   ALT 19  --   --   --   ALKPHOS 92  --   --   --   BILITOT 0.9  --   --   --   PROT 6.7  --   --   --   ALBUMIN 3.4* 3.2* 3.1* 3.0*   No results for input(s): LIPASE, AMYLASE in the last 168 hours. No results for input(s): AMMONIA in the last 168 hours. Coagulation Profile: Recent Labs  Lab 07/17/19 0106 07/18/19 0119 07/19/19 0316 07/20/19 0424 07/21/19 0342  INR 2.2* 2.3* 2.5* 3.2* 3.1*   Cardiac Enzymes: No results for input(s): CKTOTAL, CKMB, CKMBINDEX, TROPONINI in the last 168 hours. BNP (last 3 results) No results for input(s): PROBNP in the last 8760 hours. HbA1C: No results for input(s): HGBA1C in the last 72 hours. CBG: No results for input(s): GLUCAP in the last 168 hours. Lipid Profile: No results for input(s): CHOL, HDL, LDLCALC, TRIG, CHOLHDL, LDLDIRECT in the last 72 hours. Thyroid Function Tests: No results for input(s): TSH, T4TOTAL, FREET4, T3FREE, THYROIDAB in the last 72 hours. Anemia Panel: No results  for input(s): VITAMINB12, FOLATE, FERRITIN, TIBC, IRON, RETICCTPCT in the last 72 hours. Sepsis Labs: No results for input(s): PROCALCITON, LATICACIDVEN in the last 168 hours.  Recent Results (from the past 240 hour(s))  Respiratory Panel by RT PCR (Flu A&B, Covid) -     Status: None   Collection Time: 07/16/19  8:27 PM  Result Value Ref Range Status   SARS Coronavirus 2 by RT PCR NEGATIVE NEGATIVE  Final    Comment: (NOTE) SARS-CoV-2 target nucleic acids are NOT DETECTED. The SARS-CoV-2 RNA is generally detectable in upper respiratoy specimens during the acute phase of infection. The lowest concentration of SARS-CoV-2 viral copies this assay can detect is 131 copies/mL. A negative result does not preclude SARS-Cov-2 infection and should not be used as the sole basis for treatment or other patient management decisions. A negative result may occur with  improper specimen collection/handling, submission of specimen other than nasopharyngeal swab, presence of viral mutation(s) within the areas targeted by this assay, and inadequate number of viral copies (<131 copies/mL). A negative result must be combined with clinical observations, patient history, and epidemiological information. The expected result is Negative. Fact Sheet for Patients:  PinkCheek.be Fact Sheet for Healthcare Providers:  GravelBags.it This test is not yet ap proved or cleared by the [place] FDA and  has been authorized for detection and/or diagnosis of SARS-CoV-2 by FDA under an Emergency Use Authorization (EUA). This EUA will remain  in effect (meaning this test can be used) for the duration of the COVID-19 declaration under Section 564(b)(1) of the Act, 21 U.S.C. section 360bbb-3(b)(1), unless the authorization is terminated or revoked sooner.    Influenza A by PCR NEGATIVE NEGATIVE Final   Influenza B by PCR NEGATIVE NEGATIVE Final    Comment:  (NOTE) The Xpert Xpress SARS-CoV-2/FLU/RSV assay is intended as an aid in  the diagnosis of influenza from Nasopharyngeal swab specimens and  should not be used as a sole basis for treatment. Nasal washings and  aspirates are unacceptable for Xpert Xpress SARS-CoV-2/FLU/RSV  testing. Fact Sheet for Patients: PinkCheek.be Fact Sheet for Healthcare Providers: GravelBags.it This test is not yet approved or cleared by the [place] FDA and  has been authorized for detection and/or diagnosis of SARS-CoV-2 by  FDA under an Emergency Use Authorization (EUA). This EUA will remain  in effect (meaning this test can be used) for the duration of the  Covid-19 declaration under Section 564(b)(1) of the Act, 21  U.S.C. section 360bbb-3(b)(1), unless the authorization is  terminated or revoked. Performed at [facility], [place] [address] [place], [place] [address]       Radiology Studies: No results found.   Scheduled Meds: . metoprolol tartrate  37.5 mg Oral BID  . Warfarin - Pharmacist Dosing Inpatient   Does not apply q1800   Continuous Infusions: . diltiazem (CARDIZEM) infusion 5 mg/hr (07/20/19 2257)     LOS: 5 days    Time spent: 25 minutes spent in the coordination of care today.    Toniette Devera Marry Guan, MD [facility]  If 7PM-7AM, please contact night-coverage [email or web address] 07/21/2019, 10:58 AM

## 2019-07-21 NOTE — Progress Notes (Addendum)
Progress Note  Patient Name: Kendra Hughes Date of Encounter: 07/21/2019  Primary Cardiologist: Chrystie Nose, MD   Subjective   After trying oral dilt yesterday rtes went up to the 150s and patient was transitioned to IV dilt once again. Rates currently 80-90s. No CP or SOB.  Inpatient Medications    Scheduled Meds: . metoprolol tartrate  37.5 mg Oral BID  . Warfarin - Pharmacist Dosing Inpatient   Does not apply q1800   Continuous Infusions: . diltiazem (CARDIZEM) infusion 5 mg/hr (07/20/19 2257)   PRN Meds: acetaminophen **OR** acetaminophen, ondansetron (ZOFRAN) IV, Polyethyl Glycol-Propyl Glycol, traMADol   Vital Signs    Vitals:   07/21/19 0115 07/21/19 0316 07/21/19 0415 07/21/19 0445  BP: 101/61 114/64 103/70   Pulse:    72  Resp:    13  Temp:    98.7 F (37.1 C)  TempSrc:    Oral  SpO2:      Weight:    70 kg  Height:        Intake/Output Summary (Last 24 hours) at 07/21/2019 0737 Last data filed at 07/20/2019 1800 Gross per 24 hour  Intake 522 ml  Output --  Net 522 ml   Last 3 Weights 07/21/2019 07/20/2019 07/18/2019  Weight (lbs) 154 lb 5.2 oz 153 lb 14.1 oz 151 lb 7.3 oz  Weight (kg) 70 kg 69.8 kg 68.7 kg      Telemetry    Afib, HR 80-90s- Personally Reviewed  ECG    No new* - Personally Reviewed  Physical Exam   GEN: No acute distress.   Neck: No JVD Cardiac: Irreg Irreg, no murmurs, rubs, or gallops.  Respiratory: Clear to auscultation bilaterally. GI: Soft, nontender, non-distended  MS: No edema; No deformity. Neuro:  Nonfocal  Psych: Normal affect   Labs    High Sensitivity Troponin:   Recent Labs  Lab 07/16/19 2314 07/17/19 0106  TROPONINIHS 18* 19*      Chemistry Recent Labs  Lab 07/17/19 0106 07/18/19 0119 07/19/19 0316 07/20/19 0424  NA 137 136 138 139  K 3.6 3.8 4.7 4.3  CL 95* 97* 100 101  CO2 31 30 33* 31  GLUCOSE 116* 112* 103* 134*  BUN 20 23 24* 26*  CREATININE 0.85 0.86 0.87 0.88  CALCIUM 9.0 9.1  9.1 9.3  PROT 6.7  --   --   --   ALBUMIN 3.4* 3.2* 3.1* 3.0*  AST 22  --   --   --   ALT 19  --   --   --   ALKPHOS 92  --   --   --   BILITOT 0.9  --   --   --   GFRNONAA 59* 58* 57* 56*  GFRAA >60 >60 >60 >60  ANIONGAP 11 9 5 7      Hematology Recent Labs  Lab 07/18/19 0119 07/19/19 0316 07/20/19 0424  WBC 9.1 8.9 11.0*  RBC 4.23 4.21 4.16  HGB 12.9 13.0 12.7  HCT 39.1 39.3 39.5  MCV 92.4 93.3 95.0  MCH 30.5 30.9 30.5  MCHC 33.0 33.1 32.2  RDW 15.2 15.1 15.2  PLT 268 214 269    BNPNo results for input(s): BNP, PROBNP in the last 168 hours.   DDimer No results for input(s): DDIMER in the last 168 hours.   Radiology    No results found.  Cardiac Studies   Echo 07/17/19 1. Left ventricular ejection fraction, by visual estimation, is 50 to 55%. The  left ventricle has normal function. There is no left ventricular hypertrophy. 2. Left ventricular diastolic function could not be evaluated. 3. The left ventricle has no regional wall motion abnormalities. 4. Global right ventricle has normal systolic function.The right ventricular size is mildly enlarged. 5. Left atrial size was mildly dilated. 6. Right atrial size was severely dilated. 7. The mitral valve is normal in structure. Mild mitral valve regurgitation. No evidence of mitral stenosis. 8. The tricuspid valve is normal in structure. 9. The aortic valve is tricuspid. Aortic valve regurgitation is not visualized. Mild aortic valve stenosis. 10. The pulmonic valve was normal in structure. Pulmonic valve regurgitation is not visualized. 11. Mildly elevated pulmonary artery systolic pressure. 12. The inferior vena cava is normal in size with greater than 50% respiratory variability, suggesting right atrial pressure of 3 mmHg. 13. Normal LV systolic function; biatrial enlargement; mild RVE; calcified aortic valve with mild AS (mean gradient 11 mmHg; visually appears moderate); mild MR; moderate TR.  Patient  Profile     86 y.o. female  with h/o ofpermanentafib on coumadin who was admitted with back pain and weakness being seen for afib RVR.  Assessment & Plan    PermanentAfib, now withRVR - Patient found to be in afib RVR and started on IV dilt - HS troponin flat - unable to tolerate amiodarone (caused shaking) - At home patient was on Cardizem 120mg  daily and Lopressor 25 mg daily - Echo showed EF50-55%, mild AS - CHADSVASC 5 - Has been difficult to manage rate given intolerance to meds and hypotension - Continue IV dilt>> will defer to MD when to try oral dilt again - Continue Lopressor - continue warfarin - Pressures continue to be intermittentlysoft.  Chronic systolic and diastolic HF -HomeLasix40 mgheld for hypotension - EF only mildly reduced - Not a candidate for invasive procedure - Patient is euvolemic on exam -Continue BB  UTI - per IM - AM labs pending  Possible PNA - cxr - procal    For questions or updates, please contact [facility] Please consult [email or web address] for contact info under        Signed, Cadence , PA-C  07/21/2019, 7:37 AM    Patient seen and examined with C. Furth PAC.  Agree as above, with the following exceptions and changes as noted below. Feels weak and is having difficulty amubulating to chair and back. Endorsing cough that is productive Gen: fatigued, CV: irregular rhythm, tachycardic, no murmurs, Lungs: clear, Abd: soft, Extrem: Warm, well perfused, no edema, Neuro/Psych: alert and oriented x 3, normal mood and affect. All available labs, radiology testing, previous records reviewed.   I spoke to the patient and her son 07/23/2019 today at length regarding issues around rate control and factors provoking fast rates in A. fib.  She endorses a productive cough, and with her reduced mobility while in hospital, I am concerned for pneumonia.  I will obtain a chest x-ray and a procalcitonin, and I have communicated with the  internal medicine service to guide on need for antibiotics.  Her white blood cell count is rising, suggesting she may have a nidus for increased rates in A. Fib.  We have attempted to transition off of IV diltiazem several times without success.  If she is not found to have a concern for infection, we may continue to struggle with issues around rate control, and I may involve our colleagues in electrophysiology later this week.  I have reviewed the use of digoxin with the pharmacist.  It appears that in 2017 this was discontinued due to inadequate response to therapy as well as a high normal digoxin level.  She has an intolerance to amiodarone.  Ideally we can plan for oral diltiazem and oral metoprolol which is what she came into the hospital on.  We will assess for further source control if she does not fact have pneumonia, and hopefully this will assist with rate control.  [name] 07/21/19 10:39 AM

## 2019-07-22 LAB — CBC
HCT: 35.4 % — ABNORMAL LOW (ref 36.0–46.0)
Hemoglobin: 11.9 g/dL — ABNORMAL LOW (ref 12.0–15.0)
MCH: 31 pg (ref 26.0–34.0)
MCHC: 33.6 g/dL (ref 30.0–36.0)
MCV: 92.2 fL (ref 80.0–100.0)
Platelets: 243 10*3/uL (ref 150–400)
RBC: 3.84 MIL/uL — ABNORMAL LOW (ref 3.87–5.11)
RDW: 15.4 % (ref 11.5–15.5)
WBC: 9.8 10*3/uL (ref 4.0–10.5)
nRBC: 0 % (ref 0.0–0.2)

## 2019-07-22 LAB — BASIC METABOLIC PANEL
Anion gap: 10 (ref 5–15)
BUN: 26 mg/dL — ABNORMAL HIGH (ref 8–23)
CO2: 30 mmol/L (ref 22–32)
Calcium: 8.7 mg/dL — ABNORMAL LOW (ref 8.9–10.3)
Chloride: 97 mmol/L — ABNORMAL LOW (ref 98–111)
Creatinine, Ser: 0.97 mg/dL (ref 0.44–1.00)
GFR calc Af Amer: 58 mL/min — ABNORMAL LOW (ref >60)
GFR calc non Af Amer: 50 mL/min — ABNORMAL LOW (ref >60)
Glucose, Bld: 106 mg/dL — ABNORMAL HIGH (ref 70–99)
Potassium: 3.5 mmol/L (ref 3.5–5.1)
Sodium: 137 mmol/L (ref 135–145)

## 2019-07-22 LAB — PROTIME-INR
INR: 3.4 — ABNORMAL HIGH (ref 0.8–1.2)
Prothrombin Time: 34.1 seconds — ABNORMAL HIGH (ref 11.4–15.2)

## 2019-07-22 MED ORDER — POTASSIUM CHLORIDE CRYS ER 10 MEQ PO TBCR
20.0000 meq | EXTENDED_RELEASE_TABLET | Freq: Once | ORAL | Status: AC
  Administered 2019-07-22: 17:00:00 20 meq via ORAL
  Filled 2019-07-22: qty 2

## 2019-07-22 MED ORDER — DILTIAZEM HCL ER COATED BEADS 120 MG PO CP24
120.0000 mg | ORAL_CAPSULE | Freq: Every day | ORAL | Status: DC
  Administered 2019-07-22: 10:00:00 120 mg via ORAL
  Filled 2019-07-22: qty 1

## 2019-07-22 MED ORDER — METOPROLOL TARTRATE 25 MG PO TABS
25.0000 mg | ORAL_TABLET | Freq: Three times a day (TID) | ORAL | Status: DC
  Administered 2019-07-22 – 2019-07-24 (×9): 25 mg via ORAL
  Filled 2019-07-22 (×8): qty 1

## 2019-07-22 MED ORDER — DILTIAZEM HCL ER 90 MG PO CP12
90.0000 mg | ORAL_CAPSULE | Freq: Once | ORAL | Status: AC
  Administered 2019-07-22: 90 mg via ORAL
  Filled 2019-07-22: qty 1

## 2019-07-22 MED ORDER — METOPROLOL TARTRATE 25 MG PO TABS: 25.0000 mg | ORAL_TABLET | Freq: Three times a day (TID) | ORAL | Status: DC

## 2019-07-22 NOTE — Progress Notes (Signed)
Physical Therapy Treatment Patient Details Name: Kendra Hughes MRN: [id number] DOB: 05-30-1923 Today's Date: 07/22/2019    History of Present Illness Patient is a 86 y/o female who presents from PCP due to A-fib wtih RVR. PMH includes left hemiarthoplasty, RA, PAF, HTN, DVT, CHF.    PT Comments    Patient received in bed, pleasant and willing to participate in PT today. Continued education on log rolling due to back pain, patient made small attempt then declined and stated it was hurting her back more, used standard technique to get to EOB with ModA. From there able to perform functional transfers and gait approximately 81f with RW and MinAx2. Posterior lean much improved today but session remains limited by back pain, fatigue, and HR elevation into 140s with activity this morning. She was left up in the chair with all needs met, chair alarm active. Remains appropriate for SNF.    Follow Up Recommendations  SNF     Equipment Recommendations  Other (comment)(defer)    Recommendations for Other Services       Precautions / Restrictions Precautions Precautions: Fall Precaution Comments: watch HR, Afib Restrictions Weight Bearing Restrictions: No    Mobility  Bed Mobility Overal bed mobility: Needs Assistance Bed Mobility: Supine to Sit     Supine to sit: Mod assist     General bed mobility comments: ModA, Max cues for rolling but patient refused stating that it hurt her back and then got to EOB by taking her legs first and then with help for boosting trunk to upright  Transfers Overall transfer level: Needs assistance Equipment used: Rolling walker (2 wheeled) Transfers: Sit to/from SOmnicareSit to Stand: Min assist;+2 physical assistance Stand pivot transfers: Min assist;+2 physical assistance       General transfer comment: MInAx2 to power to full standing position, balance improved and less posterior unsteadiness today; MinAx2 for balance and lines  for pivot transfer to chair  Ambulation/Gait Ambulation/Gait assistance: Min assist;+2 physical assistance Gait Distance (Feet): 2 Feet Assistive device: Rolling walker (2 wheeled) Gait Pattern/deviations: Trunk flexed;Decreased stride length;Decreased step length - right;Decreased step length - left Gait velocity: decreased   General Gait Details: only tolerated gait training approximately 250fdue to fatigue, back pain, and HR elevation into 140s with activity, no posterior unsteadiness today   Stairs             Wheelchair Mobility    Modified Rankin (Stroke Patients Only)       Balance Overall balance assessment: Needs assistance Sitting-balance support: Feet supported;No upper extremity supported Sitting balance-Leahy Scale: Fair Sitting balance - Comments: min guard for safety   Standing balance support: Bilateral upper extremity supported;During functional activity Standing balance-Leahy Scale: Poor Standing balance comment: BUE support, MinAX2 for safety and upright                            Cognition Arousal/Alertness: Awake/alert Behavior During Therapy: WFL for tasks assessed/performed Overall Cognitive Status: Within Functional Limits for tasks assessed                                 General Comments: better insight into deficits, verbally acknowledged fatigue and difference between now and baseline without education or cues      Exercises      General Comments General comments (skin integrity, edema, etc.): session continues to be limited by  HR, up to 140s today with gait x68f      Pertinent Vitals/Pain Pain Assessment: Faces Faces Pain Scale: Hurts little more Pain Location: back especially with mobility Pain Descriptors / Indicators: Grimacing;Guarding;Sore;Aching Pain Intervention(s): Limited activity within patient's tolerance;Monitored during session;Repositioned    Home Living                       Prior Function            PT Goals (current goals can now be found in the care plan section) Acute Rehab PT Goals Patient Stated Goal: to get stronger PT Goal Formulation: With patient Time For Goal Achievement: 08/02/19 Potential to Achieve Goals: Good Progress towards PT goals: Progressing toward goals    Frequency    Min 2X/week      PT Plan Current plan remains appropriate    Co-evaluation              AM-PAC PT "6 Clicks" Mobility   Outcome Measure  Help needed turning from your back to your side while in a flat bed without using bedrails?: A Little Help needed moving from lying on your back to sitting on the side of a flat bed without using bedrails?: A Lot Help needed moving to and from a bed to a chair (including a wheelchair)?: A Lot Help needed standing up from a chair using your arms (e.g., wheelchair or bedside chair)?: A Lot Help needed to walk in hospital room?: A Lot Help needed climbing 3-5 steps with a railing? : Total 6 Click Score: 12    End of Session Equipment Utilized During Treatment: Gait belt Activity Tolerance: Treatment limited secondary to medical complications (Comment)(HR elevation) Patient left: in chair;with call bell/phone within reach;with chair alarm set   PT Visit Diagnosis: Pain;Difficulty in walking, not elsewhere classified (R26.2);Muscle weakness (generalized) (M62.81);Unsteadiness on feet (R26.81) Pain - part of body: (back)     Time: 1030-1057 PT Time Calculation (min) (ACUTE ONLY): 27 min  Charges:  $Therapeutic Activity: 23-37 mins                     KWindell Norfolk DPT, PN1   Supplemental Physical Therapist [place]   Pager [phone] Rehab Office [phone]

## 2019-07-22 NOTE — Progress Notes (Signed)
[name]  PROGRESS NOTE    TABBETHA KUTSCHER  NUU:[id number] DOB: 10/09/1922 DOA: 07/16/2019 PCP: Coy Saunas, MD   Brief Narrative:   [name] H86 y.o.female,w rheumatoid arthritis, hypertension, hyperlipidemia, CHF (diastolic), hx of LAA thrombus, Pafib (Chadsvasc 5, h/o remote DVT, s/p L hip fracture 2017, s/p L5 compression fracture, apparently presents with fatigue and dyspnea for the past 2-4 weeks. Pt was noted to have Afib with RVR yesterday and seen in ER in [facility] and given iv metoprolol w some improvement and sent home. Apparently seen by PCP this morning and had Afib w RVR and sent to [facility] ER for evaluation.  She is being followed by cardiology who are assisting with rate control. HR 100 - 120 during interview this AM.  She seems fatigued. Says back is ok on tramadol. PT rec is for SNF. I talked with son to update. Says he mobility decline has been over 6 months but much more sharply in the last month. Appreciate cards assistance with a fib.   Assessment & Plan:   Principal Problem:   Atrial fibrillation with RVR (HCC) Active Problems:   HTN (hypertension)   Hyperlipidemia   Hyponatremia   Hypokalemia  Afib with RVR - back on dilt gtt; continuing home metoprolol - followed by cards; has had difficulty with amiodarone and dig in the past - echo: EF 50-55%, RA/LA severely dilated - mildly elevated trp; denies CP - pharm dosing coumadin - appreciate cards help: dilt gtt, metoprolol, coumadin     - checking CXR, Procal due to WBC up and slight cough, could this be worsening Afib  - CXR with small effusion possible left base consolidation, but Procal is normal      - anticipate EP consult today 1/14  Hypokalemia Hyponatremia - labs ok. Monitor.  Acute lower UTI (citrobacter freundi) - UCx positive for citrobacter - she has completed her abx Rocephin for UTI, denies any further symptoms; WBC up, but she is a febrile and aSx;  monitor  chronic diastolic HF with some fluid overload - continue home lasix 1/13; fluid status ok overall but effusions on CXR 1/13, monitor  Chronic back pain - long history with back Fx, she says she's had multiple visits to the spine center for her back pain; son confirms - PRNs available - PT/OT eval noted; updated son, he will speak with pt  DVT prophylaxis: coumadin Code Status: FULL Family Communication: With son by phone   Disposition Plan: Needs better HR control. Rec for SNF.  Consultants:   Cardiology   ROS:  Denies CP, ab pain, N, V. Remainder 10-pt ROS is negative for all not previously mentioned.  Subjective: She seems to be doing well this AM, no complaints, says no more cough today  Objective: Vitals:   07/22/19 0333 07/22/19 0516 07/22/19 0733 07/22/19 0837  BP: 99/71 96/60 94/70  115/82  Pulse: 78 (!) 157 77 (!) 103  Resp: 16 17 (!) 23 15  Temp:  98.6 F (37 C)    TempSrc:  Oral    SpO2: 96% 97% 93% 97%  Weight:      Height:        Intake/Output Summary (Last 24 hours) at 07/22/2019 0943 Last data filed at 07/22/2019 0800 Gross per 24 hour  Intake 564 ml  Output 1300 ml  Net -736 ml   Filed Weights   07/18/19 0436 07/20/19 0552 07/21/19 0445  Weight: 68.7 kg 69.8 kg 70 kg    Examination:  General: 86  y.o. female resting in bed in NAD Cardiovascular: tachy, irregular, +S1, S2, no m/g/r Respiratory: CTA but with crackles at bases, no w/r/r, normal WOB GI: BS+, NDNT, soft, no masses noted, no organomegaly noted MSK: No e/c/c Neuro: alert to name, follows commands Psyc: calm/cooperative   Data Reviewed: I have personally reviewed following labs and imaging studies.  CBC: Recent Labs  Lab 07/18/19 0119 07/19/19 0316 07/20/19 0424 07/21/19 0754 07/22/19 0330  WBC 9.1 8.9 11.0* 11.8* 9.8  NEUTROABS 7.3 7.3 9.3*  --   --   HGB 12.9 13.0 12.7 13.2 11.9*  HCT 39.1 39.3 39.5 40.6 35.4*  MCV 92.4 93.3 95.0 94.4 92.2    PLT 268 214 269 285 243   Basic Metabolic Panel: Recent Labs  Lab 07/17/19 0106 07/17/19 0106 07/18/19 0119 07/19/19 0316 07/20/19 0424 07/21/19 0754 07/22/19 0330  NA 137   < > 136 138 139 137 137  K 3.6   < > 3.8 4.7 4.3 4.1 3.5  CL 95*   < > 97* 100 101 99 97*  CO2 31   < > 30 33* 31 28 30   GLUCOSE 116*   < > 112* 103* 134* 120* 106*  BUN 20   < > 23 24* 26* 32* 26*  CREATININE 0.85   < > 0.86 0.87 0.88 0.98 0.97  CALCIUM 9.0   < > 9.1 9.1 9.3 9.1 8.7*  MG 2.2  --  2.1 2.2 2.2  --   --   PHOS  --   --  3.6 3.3 3.5  --   --    < > = values in this interval not displayed.   GFR: Estimated Creatinine Clearance: 33.3 mL/min (by C-G formula based on SCr of 0.97 mg/dL). Liver Function Tests: Recent Labs  Lab 07/17/19 0106 07/18/19 0119 07/19/19 0316 07/20/19 0424  AST 22  --   --   --   ALT 19  --   --   --   ALKPHOS 92  --   --   --   BILITOT 0.9  --   --   --   PROT 6.7  --   --   --   ALBUMIN 3.4* 3.2* 3.1* 3.0*   No results for input(s): LIPASE, AMYLASE in the last 168 hours. No results for input(s): AMMONIA in the last 168 hours. Coagulation Profile: Recent Labs  Lab 07/18/19 0119 07/19/19 0316 07/20/19 0424 07/21/19 0342 07/22/19 0330  INR 2.3* 2.5* 3.2* 3.1* 3.4*   Cardiac Enzymes: No results for input(s): CKTOTAL, CKMB, CKMBINDEX, TROPONINI in the last 168 hours. BNP (last 3 results) No results for input(s): PROBNP in the last 8760 hours. HbA1C: No results for input(s): HGBA1C in the last 72 hours. CBG: No results for input(s): GLUCAP in the last 168 hours. Lipid Profile: No results for input(s): CHOL, HDL, LDLCALC, TRIG, CHOLHDL, LDLDIRECT in the last 72 hours. Thyroid Function Tests: No results for input(s): TSH, T4TOTAL, FREET4, T3FREE, THYROIDAB in the last 72 hours. Anemia Panel: No results for input(s): VITAMINB12, FOLATE, FERRITIN, TIBC, IRON, RETICCTPCT in the last 72 hours. Sepsis Labs: Recent Labs  Lab 07/21/19 1028  PROCALCITON  <0.10    Recent Results (from the past 240 hour(s))  Respiratory Panel by RT PCR (Flu A&B, Covid) -     Status: None   Collection Time: 07/16/19  8:27 PM  Result Value Ref Range Status   SARS Coronavirus 2 by RT PCR NEGATIVE NEGATIVE Final    Comment: (  NOTE) SARS-CoV-2 target nucleic acids are NOT DETECTED. The SARS-CoV-2 RNA is generally detectable in upper respiratoy specimens during the acute phase of infection. The lowest concentration of SARS-CoV-2 viral copies this assay can detect is 131 copies/mL. A negative result does not preclude SARS-Cov-2 infection and should not be used as the sole basis for treatment or other patient management decisions. A negative result may occur with  improper specimen collection/handling, submission of specimen other than nasopharyngeal swab, presence of viral mutation(s) within the areas targeted by this assay, and inadequate number of viral copies (<131 copies/mL). A negative result must be combined with clinical observations, patient history, and epidemiological information. The expected result is Negative. Fact Sheet for Patients:  https://www.moore.com/ Fact Sheet for Healthcare Providers:  https://www.young.biz/ This test is not yet ap proved or cleared by the [place] FDA and  has been authorized for detection and/or diagnosis of SARS-CoV-2 by FDA under an Emergency Use Authorization (EUA). This EUA will remain  in effect (meaning this test can be used) for the duration of the COVID-19 declaration under Section 564(b)(1) of the Act, 21 U.S.C. section 360bbb-3(b)(1), unless the authorization is terminated or revoked sooner.    Influenza A by PCR NEGATIVE NEGATIVE Final   Influenza B by PCR NEGATIVE NEGATIVE Final    Comment: (NOTE) The Xpert Xpress SARS-CoV-2/FLU/RSV assay is intended as an aid in  the diagnosis of influenza from Nasopharyngeal swab specimens and  should not be used as a sole  basis for treatment. Nasal washings and  aspirates are unacceptable for Xpert Xpress SARS-CoV-2/FLU/RSV  testing. Fact Sheet for Patients: https://www.moore.com/ Fact Sheet for Healthcare Providers: https://www.young.biz/ This test is not yet approved or cleared by the [place] FDA and  has been authorized for detection and/or diagnosis of SARS-CoV-2 by  FDA under an Emergency Use Authorization (EUA). This EUA will remain  in effect (meaning this test can be used) for the duration of the  Covid-19 declaration under Section 564(b)(1) of the Act, 21  U.S.C. section 360bbb-3(b)(1), unless the authorization is  terminated or revoked. Performed at [facility], [address] [place], [place] [address]       Radiology Studies: DG Chest 2 View  Result Date: 07/21/2019 CLINICAL DATA:  86 year old female with history of cough. EXAM: CHEST - 2 VIEW COMPARISON:  Chest x-ray 07/16/2019. FINDINGS: Small bilateral pleural effusions with bibasilar opacities (left greater than right), which may represent areas of atelectasis and/or consolidation. No pneumothorax. No suspicious appearing pulmonary nodules or masses are noted. No evidence of pulmonary edema. Mild cardiomegaly. The patient is rotated to the left on today's exam, resulting in distortion of the mediastinal contours and reduced diagnostic sensitivity and specificity for mediastinal pathology. Aortic atherosclerosis. IMPRESSION: 1. Small bilateral pleural effusions with areas of atelectasis and/or consolidation in the lung bases bilaterally (left greater than right). 2. Cardiomegaly. 3. Aortic atherosclerosis. Electronically Signed   By: Trudie Reed M.D.   On: 07/21/2019 11:29     Scheduled Meds: . furosemide  40 mg Oral Daily  . metoprolol tartrate  37.5 mg Oral BID  . Warfarin - Pharmacist Dosing Inpatient   Does not apply q1800   Continuous Infusions: . diltiazem (CARDIZEM)  infusion 5 mg/hr (07/22/19 0508)     LOS: 6 days    Time spent: 25 minutes spent in the coordination of care today.    Feliciana Narayan Vergie Living, MD [facility]  If 7PM-7AM, please contact night-coverage [email or web address] 07/22/2019, 9:43 AM

## 2019-07-22 NOTE — Progress Notes (Addendum)
Progress Note  Patient Name: Kendra Hughes Date of Encounter: 07/22/2019  Primary Cardiologist: Pixie Casino, MD   Subjective   Remains in afib with rates in the 80-90s. CXR showed small B/L pleural effusions and home lasix was started. Urine output 1.3 L. Bps soft.   Inpatient Medications    Scheduled Meds: . furosemide  40 mg Oral Daily  . metoprolol tartrate  37.5 mg Oral BID  . Warfarin - Pharmacist Dosing Inpatient   Does not apply q1800   Continuous Infusions: . diltiazem (CARDIZEM) infusion 5 mg/hr (07/22/19 0508)   PRN Meds: acetaminophen **OR** acetaminophen, loperamide HCl, ondansetron (ZOFRAN) IV, Polyethyl Glycol-Propyl Glycol, traMADol   Vital Signs    Vitals:   07/21/19 2235 07/22/19 0255 07/22/19 0333 07/22/19 0516  BP: 99/80 (!) 95/50 99/71 96/60   Pulse: (!) 37 81 78 (!) 157  Resp: 18 14 16 17   Temp:    98.6 F (37 C)  TempSrc:    Oral  SpO2: 94% 96% 96% 97%  Weight:      Height:        Intake/Output Summary (Last 24 hours) at 07/22/2019 0743 Last data filed at 07/22/2019 0518 Gross per 24 hour  Intake 624 ml  Output 1300 ml  Net -676 ml   Last 3 Weights 07/21/2019 07/20/2019 07/18/2019  Weight (lbs) 154 lb 5.2 oz 153 lb 14.1 oz 151 lb 7.3 oz  Weight (kg) 70 kg 69.8 kg 68.7 kg      Telemetry    Afib, Hr 70-80s, overnight pauses, longest 2.05 seconds- Personally Reviewed  ECG    No new - Personally Reviewed  Physical Exam   GEN: No acute distress.   Neck: No JVD Cardiac: Irreg Irreg, no murmurs, rubs, or gallops.  Respiratory: Clear to auscultation bilaterally. GI: Soft, nontender, non-distended  MS: No edema; No deformity. Neuro:  Nonfocal  Psych: Normal affect   Labs    High Sensitivity Troponin:   Recent Labs  Lab 07/16/19 2314 07/17/19 0106  TROPONINIHS 18* 19*      Chemistry Recent Labs  Lab 07/17/19 0106 07/17/19 0106 07/18/19 0119 07/18/19 0119 07/19/19 0316 07/19/19 0316 07/20/19 0424 07/21/19 0754  07/22/19 0330  NA 137   < > 136   < > 138   < > 139 137 137  K 3.6   < > 3.8   < > 4.7   < > 4.3 4.1 3.5  CL 95*   < > 97*   < > 100   < > 101 99 97*  CO2 31   < > 30   < > 33*   < > 31 28 30   GLUCOSE 116*   < > 112*   < > 103*   < > 134* 120* 106*  BUN 20   < > 23   < > 24*   < > 26* 32* 26*  CREATININE 0.85   < > 0.86   < > 0.87   < > 0.88 0.98 0.97  CALCIUM 9.0   < > 9.1   < > 9.1   < > 9.3 9.1 8.7*  PROT 6.7  --   --   --   --   --   --   --   --   ALBUMIN 3.4*   < > 3.2*  --  3.1*  --  3.0*  --   --   AST 22  --   --   --   --   --   --   --   --  ALT 19  --   --   --   --   --   --   --   --   ALKPHOS 92  --   --   --   --   --   --   --   --   BILITOT 0.9  --   --   --   --   --   --   --   --   GFRNONAA 59*   < > 58*   < > 57*   < > 56* 49* 50*  GFRAA >60   < > >60   < > >60   < > >60 57* 58*  ANIONGAP 11   < > 9   < > 5   < > 7 10 10    < > = values in this interval not displayed.     Hematology Recent Labs  Lab 07/20/19 0424 07/21/19 0754 07/22/19 0330  WBC 11.0* 11.8* 9.8  RBC 4.16 4.30 3.84*  HGB 12.7 13.2 11.9*  HCT 39.5 40.6 35.4*  MCV 95.0 94.4 92.2  MCH 30.5 30.7 31.0  MCHC 32.2 32.5 33.6  RDW 15.2 15.5 15.4  PLT 269 285 243    BNPNo results for input(s): BNP, PROBNP in the last 168 hours.   DDimer No results for input(s): DDIMER in the last 168 hours.   Radiology    DG Chest 2 View  Result Date: 07/21/2019 CLINICAL DATA:  86 year old female with history of cough. EXAM: CHEST - 2 VIEW COMPARISON:  Chest x-ray 07/16/2019. FINDINGS: Small bilateral pleural effusions with bibasilar opacities (left greater than right), which may represent areas of atelectasis and/or consolidation. No pneumothorax. No suspicious appearing pulmonary nodules or masses are noted. No evidence of pulmonary edema. Mild cardiomegaly. The patient is rotated to the left on today's exam, resulting in distortion of the mediastinal contours and reduced diagnostic sensitivity and  specificity for mediastinal pathology. Aortic atherosclerosis. IMPRESSION: 1. Small bilateral pleural effusions with areas of atelectasis and/or consolidation in the lung bases bilaterally (left greater than right). 2. Cardiomegaly. 3. Aortic atherosclerosis. Electronically Signed   By: 09/13/2019 M.D.   On: 07/21/2019 11:29    Cardiac Studies    Echo 07/17/19 1. Left ventricular ejection fraction, by visual estimation, is 50 to 55%. The left ventricle has normal function. There is no left ventricular hypertrophy. 2. Left ventricular diastolic function could not be evaluated. 3. The left ventricle has no regional wall motion abnormalities. 4. Global right ventricle has normal systolic function.The right ventricular size is mildly enlarged. 5. Left atrial size was mildly dilated. 6. Right atrial size was severely dilated. 7. The mitral valve is normal in structure. Mild mitral valve regurgitation. No evidence of mitral stenosis. 8. The tricuspid valve is normal in structure. 9. The aortic valve is tricuspid. Aortic valve regurgitation is not visualized. Mild aortic valve stenosis. 10. The pulmonic valve was normal in structure. Pulmonic valve regurgitation is not visualized. 11. Mildly elevated pulmonary artery systolic pressure. 12. The inferior vena cava is normal in size with greater than 50% respiratory variability, suggesting right atrial pressure of 3 mmHg. 13. Normal LV systolic function; biatrial enlargement; mild RVE; calcified aortic valve with mild AS (mean gradient 11 mmHg; visually appears moderate); mild MR; moderate TR.  Patient Profile     86 y.o. female with h/o ofpermanentafib on coumadin who was admitted with back pain and weakness being seen for afib RVR  Assessment &  Plan    PermanentAfib, now withRVR - Patient found to be in afib RVR and started on IV dilt - HS troponin flat - unable to tolerate amiodarone (caused shaking) - At home patient was on  Cardizem 120mg  daily and Lopressor 25 mg daily - Echo showed EF50-55%, mild AS - CHADSVASC 5 - Has been difficult to manage rate given intolerance to meds (digoxin did not help in the past) and hypotension - We have tried may times to transition to PO dilt but rates have gone up and IV dilt needed to be restarted. Will consult EP to help with rate control.  - Continue Lopressor - continue warfarin - Pressures continue to beintermittentlysoft.  Chronic systolic and diastolic HF -HomeLasix40 mginitially held for hypotension - EF only mildly reduced - Not a candidate for invasive procedure - CXR showed small bilateral pleural effusions - home lasix was restarted and she received IV Lasix 40 mg x 1 yesterday - Urine ouput in the last 24 hours 1,300 - Diuresis is difficult with soft pressures. Consider decreasing BB   UTI - per IM - abx completed  Possible PNA - cxr negative for PNA - procal wnl   For questions or updates, please contact [facility] Please consult [email or web address] for contact info under        Signed, Cadence , PA-C  07/22/2019, 7:43 AM    Patient seen and examined with Cadence 07/24/2019, PA-C .  Agree as above, with the following exceptions and changes as noted below. Patient feels slightly better today. No evidence of pneumonia. Gen: NAD, CV: irregular rhythm, tachycardic, no murmurs, Lungs: clear, Abd: soft, Extrem: Warm, well perfused, no edema, Neuro/Psych: alert and oriented x 3, normal mood and affect. All available labs, radiology testing, previous records reviewed. Continues on diltiazem drip however we have struggled to gain rate control without resultant hypotension. Will request the assistance of our colleagues in EP today for further recommendations of oral agents.   David Stall 07/22/19 2:20 PM

## 2019-07-22 NOTE — TOC Initial Note (Signed)
Transition of Care [facility]) - Initial/Assessment Note    Patient Details  Name: Kendra Hughes MRN: [id number] Date of Birth: Sep 22, 1922  Transition [facility]) CM/SW Contact:    Doy Hutching, LCSWA Phone Number: 07/22/2019, 2:04 PM  Clinical Narrative:                 CSW spoke with pt via telephone. Obtained permission to call and speak with pt son Aneta Mins. Aneta Mins was contacted at [phone]. CSW introduced self, role, reason for visit.   Pt from home with spouse, pt has been household ambulating with her walker. She previously had been walking with the walker out in the community but has been mobilizing less lately. Her spouse is "more agile" but cannot assist pt should she fall. Pt son discussed that pt is followed by cardiology here in [place], she has a hx of vetebral fxs per his report. He and his brother, as well as pt/pt husband would like for pt to go to inpatient rehab here at [facility]. Pt has been to CIR in the past and was able to return home.   We discussed that ultimately CIR would have to assess pt for placement there and if not that we should prepare for a back up plan. Pt family preference, if no CIR offer is for [facility].   Per Okey Regal at [facility], bed available for pt if they would like.   TOC team continue to follow.   Expected Discharge Plan: Skilled Nursing Facility Barriers to Discharge: Continued Medical Work up   Patient Goals and CMS Choice   CMS Medicare.gov Compare Post Acute Care list provided to:: Patient Represenative (must comment) Choice offered to / list presented to : Adult Children  Expected Discharge Plan and Services Expected Discharge Plan: Skilled Nursing Facility In-house Referral: Clinical Social Work Discharge Planning Services: CM Consult Post Acute Care Choice: Skilled Nursing Facility, IP Rehab Living arrangements for the past 2 months: Single Family Home    Prior Living Arrangements/Services Living  arrangements for the past 2 months: Single Family Home Lives with:: Spouse Patient language and need for interpreter reviewed:: Yes(no needs) Do you feel safe going back to the place where you live?: Yes      Need for Family Participation in Patient Care: Yes (Comment)(assistance with ADL/IADLs) Care giver support system in place?: Yes (comment)(pt spouse; adult children) Current home services: DME Criminal Activity/Legal Involvement Pertinent to Current Situation/Hospitalization: No - Comment as needed  Activities of Daily Living Home Assistive Devices/Equipment: None ADL Screening (condition at time of admission) Patient's cognitive ability adequate to safely complete daily activities?: Yes Is the patient deaf or have difficulty hearing?: No Does the patient have difficulty seeing, even when wearing glasses/contacts?: No Does the patient have difficulty concentrating, remembering, or making decisions?: No Patient able to express need for assistance with ADLs?: Yes Does the patient have difficulty dressing or bathing?: No Independently performs ADLs?: Yes (appropriate for developmental age) Does the patient have difficulty walking or climbing stairs?: Yes Weakness of Legs: Both Weakness of Arms/Hands: None  Permission Sought/Granted Permission sought to share information with : Family Supports Permission granted to share information with : Yes, Verbal Permission Granted  Share Information with NAME: Shanedra Lave  Permission granted to share info w AGENCY: [facility]/CIR  Permission granted to share info w Relationship: son  Permission granted to share info w Contact Information: [phone]  Emotional Assessment Appearance:: Other (Comment Required(telephonic assessment) Attitude/Demeanor/Rapport: (telephonic assessment) Affect (typically observed): (  telephonic assessment) Orientation: : Oriented to Self, Oriented to Place, Oriented to  Time, Oriented to  Situation Alcohol / Substance Use: Not Applicable Psych Involvement: No (comment)  Admission diagnosis:  A-fib ([place]) [I48.91] Atrial fibrillation with RVR ([place]) [I48.91] Patient Active Problem List   Diagnosis Date Noted  . Hypokalemia 07/16/2019  . Pulmonary hypertension, unspecified ([place]) 06/23/2017  . Preoperative cardiovascular examination 09/05/2016  . Anticoagulated on warfarin 09/05/2016  . Diastolic dysfunction 42/70/6237  . Closed fracture of femur ([place]) 12/07/2015  . Essential hypertension 12/07/2015  . Tremors of nervous system   . Acute on chronic diastolic congestive heart failure ([place])   . UTI (lower urinary tract infection)   . Cellulitis of right upper extremity   . S/P hip hemiarthroplasty   . Hypoalbuminemia due to protein-calorie malnutrition ([place])   . Urinary retention   . Absence of bladder continence   . Insomnia   . Femoral neck fracture ([place]) 11/16/2015  . Atrial fibrillation with rapid ventricular response ([place])   . Primary osteoarthritis of right knee   . Atrial fibrillation with RVR ([place])   . Fracture of femoral neck, left ([place])   . Tachycardia   . Acute blood loss anemia   . Postoperative pain   . Postoperative hypotension   . Vitamin D deficiency   . Hyponatremia   . Status post hip hemiarthroplasty   . Closed left hip fracture ([place]) 11/10/2015  . Chronic diastolic heart failure ([place])   . Hypotension 08/26/2013  . Mild AI, MR, mod TR by TEE 08/23/13 08/26/2013  . Persistent atrial fibrillation ([place]) 02/12/2013  . HTN (hypertension) 02/12/2013  . Hyperlipidemia 02/12/2013  . Shortness of breath 02/12/2013   PCP:  Coy Saunas, MD Pharmacy:   [place], [place] [address] Phone: [phone] Fax: [phone]  [place], [place], [place] [address] [place] [address] Phone: [phone] Fax:  [phone]     Social Determinants of Health (SDOH) Interventions    Readmission Risk Interventions Readmission Risk Prevention Plan 07/22/2019  Transportation Screening Complete  PCP or Specialist Appt within 5-7 Days Complete  Home Care Screening Complete  Medication Review (RN CM) Complete  Some recent data might be hidden

## 2019-07-22 NOTE — Consult Note (Addendum)
Cardiology Consultation:   Patient ID: Kendra Hughes MRN: [id number]; DOB: 18-Mar-1923  Admit date: 07/16/2019 Date of Consult: 07/22/2019  Primary Care Provider: Carmin Richmond, MD Primary Cardiologist: Kendra Nose, MD  Primary Electrophysiologist:  None    Patient Profile:   Kendra Hughes is a 86 y.o. female with a hx of chronic CHF (diastolic), AFib (described as longstanding persistent), h/o LA thrombus, on warfarin, RA, HLD, who is being seen today for the evaluation of rate control strategies at the request of Kendra Hughes.  History of Present Illness:   Kendra Hughes last saw Kendra Hughes 05/21/2019 via telehealth visit, she had no reports of RVR, no falls, doing fairly well with no changes made to  Her meds outside of instructed to resume her statin.  She sought attention in an ER in [place], given IV metoprolol with improvement in RVR and discharged from the ER.  She had recurrent RVR and was admitted to [facility] 07/16/2019 with AFib RVR, started on dilt gtt, also noted to have an acute UTI.  Cardiology was consulted, noting amiodarone is listed as an allergy with reports of muscle tremors,weakness.  Cardiology also make note of intolerance to Dig though unclear why.  Outpatient her BB has had to be reduced with some lower BP's. Was hoped that treatment of her pain, UTI would improve her HR, given limited medical options for her.  She was not felt to be volume overloaded and her home lasix held to allow more BP room for rate control meds.  Noted mildly reduced LVEF (50-55%), though not felt to be invasive w/u candidate and not pursued.  HS trop flat and not felt to be ACS.   Rate control despite management of UTI, has proven difficult, felt to have developed some volume OL, lasix resumed, no room on BP for further rate control drugs. EP is asked to weigh in.  LABS (most current) K+ 3.5 Mag 2.2 BUN/Creat 26/0.97 WBC 9.8 H.H 11/35  (Hgb is about where she has been 12.7-13 generally) Plts  243  INR 3.4  procalcitonin <10 HS Trop 18. 19    VSS I do not see and SBP < 90, generally high 90's-low 100's Afebrile sats 93-98% RA  I/O yesterday -276 (no output recorded on the 10-12th, so unclear what her cumulative I/O is) Weight is up 8lbs from admit wweight  Meds (rate) -- Lopressor  BID >> 37.5mg  BID on 07/17/2018 got both doses yesterday, but only on each day the prior 2 days) -- dilt gtt at /hr (has failed attempts to transition to PO) -- she got  PO dilt x1 yesterday and day prior  Home meds  lopressor  BID Diltiazem  daily Lasix  daily  Heart Pathway Score:     Past Medical History:  Diagnosis Date  . Arthritis    "right knee" (11/10/2015)  . Chronic diastolic (congestive) heart failure (HCC)   . DOE (dyspnea on exertion)   . DVT (deep venous thrombosis) (HCC) "years ago"  . Dyslipidemia   . Fracture of left hip (HCC) 11/09/2015   "fell"  . History of nuclear stress test 10/2009   normal exam  . Hypertension   . PAF (paroxysmal atrial fibrillation) (HCC)   . Rheumatoid arthritis (HCC) 1970   "it's in remission" (11/10/2015)    Past Surgical History:  Procedure Laterality Date  . ABDOMINAL HYSTERECTOMY  1996  . BLADDER SUSPENSION  X 2  . BREAST BIOPSY Right 1984   "benign"  .  CARDIOVERSION N/A 11/01/2013   Procedure: CARDIOVERSION;  Surgeon: Kendra Nose, MD;  Location[facility] ENDOSCOPY;  Service: Cardiovascular;  Laterality: N/A;  . CATARACT EXTRACTION W/ INTRAOCULAR LENS  IMPLANT, BILATERAL Bilateral 2003  . RECTOCELE REPAIR    . TEE WITHOUT CARDIOVERSION N/A 11/01/2013   Procedure: TRANSESOPHAGEAL ECHOCARDIOGRAM (TEE);  Surgeon: Kendra Nose, MD;  Location: [facility] ENDOSCOPY;  Service: Cardiovascular;  Laterality: N/A;  . TONSILLECTOMY    . TOTAL HIP ARTHROPLASTY Left 11/11/2015   Procedure:  HEMI ARTHROPLASTY ANTERIOR APPROACH LEFT;  Surgeon: Kendra Hitch, MD;  Location: [facility] OR;  Service: Orthopedics;  Laterality:  Left;  . TRANSTHORACIC ECHOCARDIOGRAM  07/22/2012   EF 55-60%; mild AV regurg; mild MR; mild-mod TR     Home Medications:  Prior to Admission medications   Medication Sig Start Date End Date Taking? Authorizing Provider  acetaminophen (TYLENOL) 650 MG CR tablet Take 650-1,300 mg by mouth every 8 (eight) hours as needed for pain (headache).   Yes [provider]  diltiazem (CARDIZEM CD) 120 MG 24 hr capsule TAKE 1 CAPSULE EVERY DAY Patient taking differently: Take 120 mg by mouth daily.  01/25/19  Yes Hilty, Lisette Abu, MD  furosemide (LASIX) 40 MG tablet TAKE 1 TABLET EVERY DAY  (KEEP APPOINTMENT FOR REFILLS) Patient taking differently: Take 40 mg by mouth daily.  07/15/19  Yes Hilty, Lisette Abu, MD  metoprolol tartrate (LOPRESSOR) 25 MG tablet TAKE 1 TABLET TWICE DAILY (DOSE CHANGE) Patient taking differently: Take 25 mg by mouth 2 (two) times daily.  06/15/19  Yes Hilty, Lisette Abu, MD  Polyethyl Glycol-Propyl Glycol (SYSTANE OP) Place 1 drop into both eyes daily as needed (dry eyes).   Yes [provider]  Vitamin D, Ergocalciferol, (DRISDOL) 1.25 MG (50000 UT) CAPS capsule Take 50,000 Units by mouth every Monday. 07/12/19  Yes [provider]  warfarin (COUMADIN) 1 MG tablet Take 0.5 mg by mouth every evening. Take with 2 mg tablet for a total dose of 2 1/2 mg every evening   Yes [provider]  warfarin (COUMADIN) 2 MG tablet Take 2 mg by mouth every evening. Take with 1/2 1 mg tablet for a total dose of 2 1/2 mg every evening 02/26/16  Yes [provider]    Inpatient Medications: Scheduled Meds: . furosemide  40 mg Oral Daily  . metoprolol tartrate  37.5 mg Oral BID  . Warfarin - Pharmacist Dosing Inpatient   Does not apply q1800   Continuous Infusions: . diltiazem (CARDIZEM) infusion 5 mg/hr (07/22/19 0508)   PRN Meds: acetaminophen **OR** acetaminophen, loperamide HCl, ondansetron (ZOFRAN) IV, Polyethyl Glycol-Propyl Glycol,  traMADol  Allergies:    Allergies  Allergen Reactions  . Amiodarone Other (See Comments)    Tremor, muscle weakness    Social History:   Social History   Socioeconomic History  . Marital status: Married    Spouse name: Not on file  . Number of children: 2  . Years of education: Not on file  . Highest education level: Not on file  Occupational History  . Not on file  Tobacco Use  . Smoking status: Never Smoker  . Smokeless tobacco: Never Used  Substance and Sexual Activity  . Alcohol use: No  . Drug use: No  . Sexual activity: Never  Other Topics Concern  . Not on file  Social History Narrative  . Not on file   Social Determinants of Health   Financial Resource Strain:   . Difficulty of Paying  Living Expenses: Not on file  Food Insecurity:   . Worried About Programme researcher, broadcasting/film/video in the Last Year: Not on file  . Ran Out of Food in the Last Year: Not on file  Transportation Needs:   . Lack of Transportation (Medical): Not on file  . Lack of Transportation (Non-Medical): Not on file  Physical Activity:   . Days of Exercise per Week: Not on file  . Minutes of Exercise per Session: Not on file  Stress:   . Feeling of Stress : Not on file  Social Connections:   . Frequency of Communication with Friends and Family: Not on file  . Frequency of Social Gatherings with Friends and Family: Not on file  . Attends Religious Services: Not on file  . Active Member of Clubs or Organizations: Not on file  . Attends Banker Meetings: Not on file  . Marital Status: Not on file  Intimate Partner Violence:   . Fear of Current or Ex-Partner: Not on file  . Emotionally Abused: Not on file  . Physically Abused: Not on file  . Sexually Abused: Not on file    Family History:   Family History  Problem Relation Age of Onset  . Heart disease Mother      ROS:  Please see the history of present illness.  All other ROS reviewed and negative.     Physical Exam/Data:    Vitals:   07/22/19 0333 07/22/19 0516 07/22/19 0733 07/22/19 0837  BP: 99/71 96/60 94/70  115/82  Pulse: 78 (!) 157 77 (!) 103  Resp: 16 17 (!) 23 15  Temp:  98.6 F (37 C)    TempSrc:  Oral    SpO2: 96% 97% 93% 97%  Weight:      Height:        Intake/Output Summary (Last 24 hours) at 07/22/2019 0854 Last data filed at 07/22/2019 0800 Gross per 24 hour  Intake 744 ml  Output 1300 ml  Net -556 ml   Last 3 Weights 07/21/2019 07/20/2019 07/18/2019  Weight (lbs) 154 lb 5.2 oz 153 lb 14.1 oz 151 lb 7.3 oz  Weight (kg) 70 kg 69.8 kg 68.7 kg     Body mass index is 27.34 kg/m.  General:  Well nourished, well developed, in no acute distress, talking on the phone when I enter HEENT: normal Lymph: no adenopathy Neck: no JVD Endocrine:  No thryomegaly Vascular: No carotid bruits Cardiac:  irreg-irreg, soft SM, no gallops or rubs Lungs:  CTA b/l, no wheezing, rhonchi or rales  Abd: soft, nontender  Ext: no edema Musculoskeletal:  No deformities, thin, frai body habitus, age appropriate atrophy for 89 years Skin: warm and dry  Neuro:  no focal abnormalities noted Psych:  Normal affect   EKG:  The EKG was personally reviewed and demonstrates:   #1 Afib 133bpm, nonspecific ST/T changes #2 AFib 156bpm, no cignificant change from prior, but faster  03/27/2018, AFib, 74bpm  Telemetry:  Telemetry was personally reviewed and demonstrates:   AFib, rest rates appear generally 70's-90's, daytime/presumed more active times of day 110's, up to 120's (currently 90's)  Relevant CV Studies:  07/17/2019: TTE IMPRESSIONS  1. Left ventricular ejection fraction, by visual estimation, is 50 to 55%. The left ventricle has normal function. There is no left ventricular hypertrophy.  2. Left ventricular diastolic function could not be evaluated.  3. The left ventricle has no regional wall motion abnormalities.  4. Global right ventricle has normal systolic function.The  right ventricular size is mildly  enlarged.  5. Left atrial size was mildly dilated.  6. Right atrial size was severely dilated.  7. The mitral valve is normal in structure. Mild mitral valve regurgitation. No evidence of mitral stenosis.  8. The tricuspid valve is normal in structure.  9. The aortic valve is tricuspid. Aortic valve regurgitation is not visualized. Mild aortic valve stenosis. 10. The pulmonic valve was normal in structure. Pulmonic valve regurgitation is not visualized. 11. Mildly elevated pulmonary artery systolic pressure. 12. The inferior vena cava is normal in size with greater than 50% respiratory variability, suggesting right atrial pressure of 3 mmHg. 13. Normal LV systolic function; biatrial enlargement; mild RVE; calcified aortic valve with mild AS (mean gradient 11 mmHg; visually appears moderate); mild MR; moderate TR    Laboratory Data:  High Sensitivity Troponin:   Recent Labs  Lab 07/16/19 2314 07/17/19 0106  TROPONINIHS 18* 19*     Chemistry Recent Labs  Lab 07/20/19 0424 07/21/19 0754 07/22/19 0330  NA 139 137 137  K 4.3 4.1 3.5  CL 101 99 97*  CO2 31 28 30   GLUCOSE 134* 120* 106*  BUN 26* 32* 26*  CREATININE 0.88 0.98 0.97  CALCIUM 9.3 9.1 8.7*  GFRNONAA 56* 49* 50*  GFRAA >60 57* 58*  ANIONGAP 7 10 10     Recent Labs  Lab 07/17/19 0106 07/17/19 0106 07/18/19 0119 07/19/19 0316 07/20/19 0424  PROT 6.7  --   --   --   --   ALBUMIN 3.4*   < > 3.2* 3.1* 3.0*  AST 22  --   --   --   --   ALT 19  --   --   --   --   ALKPHOS 92  --   --   --   --   BILITOT 0.9  --   --   --   --    < > = values in this interval not displayed.   Hematology Recent Labs  Lab 07/20/19 0424 07/21/19 0754 07/22/19 0330  WBC 11.0* 11.8* 9.8  RBC 4.16 4.30 3.84*  HGB 12.7 13.2 11.9*  HCT 39.5 40.6 35.4*  MCV 95.0 94.4 92.2  MCH 30.5 30.7 31.0  MCHC 32.2 32.5 33.6  RDW 15.2 15.5 15.4  PLT 269 285 243   BNPNo results for input(s): BNP, PROBNP in the last 168 hours.  DDimer No  results for input(s): DDIMER in the last 168 hours.   Radiology/Studies:   DG Chest 2 View Result Date: 07/21/2019 CLINICAL DATA:  86 year old female with history of cough. EXAM: CHEST - 2 VIEW COMPARISON:  Chest x-ray 07/16/2019. FINDINGS: Small bilateral pleural effusions with bibasilar opacities (left greater than right), which may represent areas of atelectasis and/or consolidation. No pneumothorax. No suspicious appearing pulmonary nodules or masses are noted. No evidence of pulmonary edema. Mild cardiomegaly. The patient is rotated to the left on today's exam, resulting in distortion of the mediastinal contours and reduced diagnostic sensitivity and specificity for mediastinal pathology. Aortic atherosclerosis. IMPRESSION: 1. Small bilateral pleural effusions with areas of atelectasis and/or consolidation in the lung bases bilaterally (left greater than right). 2. Cardiomegaly. 3. Aortic atherosclerosis. Electronically Signed   By: 97 M.D.   On: 07/21/2019 11:29   { Assessment and Plan:    She denies any overt cardiac awareness, no CP.  Denies SOB.  She very much wants to go home.  Mentions her family is to worried about catching  COVID to visit, her husband included, who she worries about.  A bit frustrated with slow progress on HR control (we discussed the challenges of this), and she asked if we would communicate with her son Doren Custard once new POC is established    1. AFib, described as longstanding persistent outpatient (likely permanent)     most recent ECG prior to now in 2019 was AFib     CHA2DS2Vasc is 3, on warfarin     INRs today 3.4, pharmacy managing     Rates ON IV dilt look generally well controlled, in d/w attending cards though when switched to PO, ends up fast and requires resumption of gtt.  I am not against dig. I can not find why it was stopped, the patient reports being on it for years but stop[ped years ago.   She reports checking her BP at home and  typically SBP runs for her 100-110, not usually higher, so I don't think we are too far from her baseline.  Chelsi Warr change to lopressor to 25mg  TID (same total current dose), resume dilt 120 her home dose, and stop the gtt. Looks like she is planned for SNF 2/2 functional decline, continue PT while here  Pace and ablate is an option, though if we can, would try to avoid invasive strategies.    Dr. Curt Bears Deandrew Hoecker see later today    For questions or updates, please contact [facility] Please consult [email or web address] for contact info under     Signed, Baldwin Jamaica, PA-C  07/22/2019 8:54 AM  I have seen and examined this patient with Tommye Standard.  Agree with above, note added to reflect my findings.  On exam, irregular, tachycardic.  Patient remains in rapid atrial fibrillation with heart rates between 101 20.  Goal heart rate for her would likely be between 90 and 100.  She takes both metoprolol and diltiazem at home, though low doses.  We Keilyn Nadal restart her on 120 mg of diltiazem as well as 25 mg of metoprolol 3 times daily.  If her heart rates remain elevated, we Ravon Mcilhenny give her an extra dose of 90 mg of diltiazem to see if that Bohden Dung help to control her rates.  Hopefully we can avoid restarting her diltiazem drip.  Would also try to avoid pacemaker implant.  Annasophia Crocker M. Lotoya Casella MD 07/22/2019 11:29 AM

## 2019-07-22 NOTE — NC FL2 (Signed)
[place] MEDICAID FL2 LEVEL OF CARE SCREENING TOOL     IDENTIFICATION  Patient Name: Kendra Hughes Birthdate: [birth year] Sex: female Admission Date (Current Location): 07/16/2019  [facility] and [place] Number:  Herbalist and Address:  [facility], [place] [address] [place], [place] [address]      Provider Number: [id number]  Attending Physician Name and Address:  Hollice Gong, [place] Name and Phone Number:       Current Level of Care: Hospital Recommended Level of Care: [place] Prior Approval Number:    Date Approved/Denied:   PASRR Number:    Discharge Plan: SNF    Current Diagnoses: Patient Active Problem List   Diagnosis Date Noted  . Hypokalemia 07/16/2019  . Pulmonary hypertension, unspecified ([place]) 06/23/2017  . Preoperative cardiovascular examination 09/05/2016  . Anticoagulated on warfarin 09/05/2016  . Diastolic dysfunction 89/38/1017  . Closed fracture of femur ([place]) 12/07/2015  . Essential hypertension 12/07/2015  . Tremors of nervous system   . Acute on chronic diastolic congestive heart failure ([place])   . UTI (lower urinary tract infection)   . Cellulitis of right upper extremity   . S/P hip hemiarthroplasty   . Hypoalbuminemia due to protein-calorie malnutrition ([place])   . Urinary retention   . Absence of bladder continence   . Insomnia   . Femoral neck fracture ([place]) 11/16/2015  . Atrial fibrillation with rapid ventricular response ([place])   . Primary osteoarthritis of right knee   . Atrial fibrillation with RVR ([place])   . Fracture of femoral neck, left ([place])   . Tachycardia   . Acute blood loss anemia   . Postoperative pain   . Postoperative hypotension   . Vitamin D deficiency   . Hyponatremia   . Status post hip hemiarthroplasty   . Closed left hip fracture ([place]) 11/10/2015  . Chronic diastolic heart failure ([place])   . Hypotension 08/26/2013  . Mild AI, MR, mod TR by TEE 08/23/13  08/26/2013  . Persistent atrial fibrillation ([place]) 02/12/2013  . HTN (hypertension) 02/12/2013  . Hyperlipidemia 02/12/2013  . Shortness of breath 02/12/2013    Orientation RESPIRATION BLADDER Height & Weight     Time, Self, Situation, Place  Normal Continent, External catheter Weight: 154 lb 5.2 oz (70 kg) Height:  5\' 3"  (160 cm)  BEHAVIORAL SYMPTOMS/MOOD NEUROLOGICAL BOWEL NUTRITION STATUS      Continent Diet(see discharge summary)  AMBULATORY STATUS COMMUNICATION OF NEEDS Skin   Extensive Assist Verbally Other (Comment)(MASD on groin with barrier cream and cleansing)                       Personal Care Assistance Level of Assistance  Bathing, Dressing, Feeding Bathing Assistance: Maximum assistance Feeding assistance: Independent Dressing Assistance: Maximum assistance     Functional Limitations Info  Sight, Hearing, Speech Sight Info: Adequate Hearing Info: Adequate Speech Info: Adequate    SPECIAL CARE FACTORS FREQUENCY  OT (By licensed OT), PT (By licensed PT)     PT Frequency: 5x week OT Frequency: 5x week            Contractures Contractures Info: Not present    Additional Factors Info  Code Status, Allergies Code Status Info: Full Code Allergies Info: Amiodarone           Current Medications (07/22/2019):  This is the current hospital active medication list Current Facility-Administered Medications  Medication Dose Route Frequency Provider Last Rate Last  Admin  . acetaminophen (TYLENOL) tablet 650 mg  650 mg Oral Q6H PRN Pearson Grippe, MD   650 mg at 07/17/19 1455   Or  . acetaminophen (TYLENOL) suppository 650 mg  650 mg Rectal Q6H PRN Pearson Grippe, MD      . diltiazem (CARDIZEM CD) 24 hr capsule 120 mg  120 mg Oral Daily Sheilah Pigeon, PA-C   120 mg at 07/22/19 1029  . diltiazem (CARDIZEM SR) 12 hr capsule 90 mg  90 mg Oral Once Sheilah Pigeon, PA-C      . furosemide (LASIX) tablet 40 mg  40 mg Oral Daily Kirby Crigler, Mir Mohammed, MD   40  mg at 07/22/19 1013  . loperamide HCl (IMODIUM) 1 MG/7.5ML suspension 2 mg  2 mg Oral PRN Colon Branch, MD   2 mg at 07/22/19 0817  . metoprolol tartrate (LOPRESSOR) tablet 25 mg  25 mg Oral TID Sheilah Pigeon, PA-C   25 mg at 07/22/19 1026  . ondansetron (ZOFRAN) injection 4 mg  4 mg Intravenous Q6H PRN Bodenheimer, Charles A, NP      . polyethylene glycol 0.4% and propylene glycol 0.3% (SYSTANE) ophthalmic gel   Both Eyes Daily PRN Pearson Grippe, MD      . traMADol Janean Sark) tablet 100 mg  100 mg Oral Q6H PRN Ronaldo Miyamoto, Tyrone A, DO   100 mg at 07/21/19 2133  . Warfarin - Pharmacist Dosing Inpatient   Does not apply q1800 Masters, Darl Householder, [facility]         Discharge Medications: Please see discharge summary for a list of discharge medications.  Relevant Imaging Results:  Relevant Lab Results:   Additional Information SSN:  [id number] [place], [place]

## 2019-07-22 NOTE — Progress Notes (Signed)
ANTICOAGULATION CONSULT NOTE  Pharmacy Consult for warfarin Indication: atrial fibrillation   Vital Signs: Temp: 98.6 F (37 C) (01/14 0516) Temp Source: Oral (01/14 0516) BP: 123/107 (01/14 1257) Pulse Rate: 100 (01/14 1257)  Labs: Recent Labs    07/20/19 0424 07/20/19 0424 07/21/19 0342 07/21/19 0754 07/22/19 0330  HGB 12.7   < >  --  13.2 11.9*  HCT 39.5  --   --  40.6 35.4*  PLT 269  --   --  285 243  LABPROT 32.7*  --  31.8*  --  34.1*  INR 3.2*  --  3.1*  --  3.4*  CREATININE 0.88  --   --  0.98 0.97   < > = values in this interval not displayed.    Estimated Creatinine Clearance: 33.3 mL/min (by C-G formula based on SCr of 0.97 mg/dL).   Medical History: Past Medical History:  Diagnosis Date  . Arthritis    "right knee" (11/10/2015)  . Chronic diastolic (congestive) heart failure (HCC)   . DOE (dyspnea on exertion)   . DVT (deep venous thrombosis) (HCC) "years ago"  . Dyslipidemia   . Fracture of left hip (HCC) 11/09/2015   "fell"  . History of nuclear stress test 10/2009   normal exam  . Hypertension   . PAF (paroxysmal atrial fibrillation) (HCC)   . Rheumatoid arthritis (HCC) 1970   "it's in remission" (11/10/2015)     Assessment: 86 yo female on warfarin prior to admission for hx of AFib with RVR and hx of LAA thrombus. PTA warfarin: 2.5 mg po daily  INR today is supratherapeutic at 3.4 after low dose warfarin0.5mg  last pm. Hgb stable, plts WNL at 185 No bleeding  Noted . Eating 25-50% of documented meals. Renal function has been stable.   Goal of Therapy:  INR 2-3 Monitor platelets by anticoagulation protocol: Yes   Plan:  -Warfarin - none tonight -Daily INR -Monitor for signs of bleeding    Leota Sauers Pharm.D. CPP, BCPS Clinical Pharmacist [phone] 07/22/2019 1:26 PM    Please check AMION for all [facility] Pharmacy phone numbers After 10:00 PM, call Main Pharmacy [phone]

## 2019-07-22 NOTE — Progress Notes (Addendum)
HR 115-120's (just got back into bed) BP 112/78 No cardiac complaints, mentions her back pain is primarily what bothers her (known compression fractures)  Will give 90mg  diltiazem (of 12 hour formulation) as planned  The patient asked me to touch base with her son, .  I did have the opprotunity to speak with him, discussed our current strategy,  Answered his questions, he was appreciative of the call.   Aneta Mins, PA-C

## 2019-07-22 NOTE — Social Work (Signed)
CSW had secure messaged MD regarding pt family preference at 1:21pm. Discovered it was not read, paged MD at 4:00pm, MD had left the building and is unable to enter orders per his report. CSW unable to enter order for CIR. Will request f/u from MD tomorrow.  Octavio Graves, MSW, LCSWA [place] Clinical Social Work

## 2019-07-23 LAB — BASIC METABOLIC PANEL
Anion gap: 9 (ref 5–15)
BUN: 23 mg/dL (ref 8–23)
CO2: 32 mmol/L (ref 22–32)
Calcium: 9.1 mg/dL (ref 8.9–10.3)
Chloride: 96 mmol/L — ABNORMAL LOW (ref 98–111)
Creatinine, Ser: 0.97 mg/dL (ref 0.44–1.00)
GFR calc Af Amer: 58 mL/min — ABNORMAL LOW (ref >60)
GFR calc non Af Amer: 50 mL/min — ABNORMAL LOW (ref >60)
Glucose, Bld: 104 mg/dL — ABNORMAL HIGH (ref 70–99)
Potassium: 4.1 mmol/L (ref 3.5–5.1)
Sodium: 137 mmol/L (ref 135–145)

## 2019-07-23 LAB — CBC
HCT: 38 % (ref 36.0–46.0)
Hemoglobin: 12.4 g/dL (ref 12.0–15.0)
MCH: 30.7 pg (ref 26.0–34.0)
MCHC: 32.6 g/dL (ref 30.0–36.0)
MCV: 94.1 fL (ref 80.0–100.0)
Platelets: 282 10*3/uL (ref 150–400)
RBC: 4.04 MIL/uL (ref 3.87–5.11)
RDW: 15.2 % (ref 11.5–15.5)
WBC: 9.3 10*3/uL (ref 4.0–10.5)
nRBC: 0 % (ref 0.0–0.2)

## 2019-07-23 LAB — SARS CORONAVIRUS 2 (TAT 6-24 HRS): SARS Coronavirus 2: NEGATIVE

## 2019-07-23 LAB — PROTIME-INR
INR: 2.6 — ABNORMAL HIGH (ref 0.8–1.2)
Prothrombin Time: 28 seconds — ABNORMAL HIGH (ref 11.4–15.2)

## 2019-07-23 MED ORDER — DILTIAZEM HCL ER COATED BEADS 180 MG PO CP24
180.0000 mg | ORAL_CAPSULE | Freq: Every day | ORAL | Status: DC
  Administered 2019-07-23: 180 mg via ORAL
  Filled 2019-07-23 (×2): qty 1

## 2019-07-23 MED ORDER — WARFARIN 0.5 MG HALF TABLET
0.5000 mg | ORAL_TABLET | Freq: Once | ORAL | Status: AC
  Administered 2019-07-23: 17:00:00 0.5 mg via ORAL
  Filled 2019-07-23: qty 1

## 2019-07-23 NOTE — Progress Notes (Signed)
Occupational Therapy Treatment Patient Details Name: Kendra Hughes MRN: [id number] DOB: 1923-05-02 Today's Date: 07/23/2019    History of present illness Patient is a 86 y/o female who presents from PCP due to A-fib wtih RVR. PMH includes left hemiarthoplasty, RA, PAF, HTN, DVT, CHF.   OT comments  Patient semi-supine upon arrival, agreeable to OT. Patient demonstrates improved standing balance, activity tolerance this session requiring min A x1 for sit to stand and functional transfers to bedside commode, recliner chair. Patient able to initiate peri care however due to elevated HR with exertion and fatigue does require mod A for thoroughness. Highest HR noted with activity was 160, mostly maintained between 130-145. At rest HR between 105-115. If patient continues to improve her mobility and HR is stabilized may be able to progress to [facility] with 24/7 SUP from spouse. Will continue with acute OT services for further ADL retraining, improved activity tolerance, transfer training, strengthening.    Follow Up Recommendations  SNF;Supervision/Assistance - 24 hour;Other (comment)(if pt cont to progress w mob and HR stabilizes, poss HH?)    Equipment Recommendations  None recommended by OT       Precautions / Restrictions Precautions Precautions: Fall Precaution Comments: watch HR, Afib Restrictions Weight Bearing Restrictions: No       Mobility Bed Mobility Overal bed mobility: Needs Assistance Bed Mobility: Supine to Sit     Supine to sit: Min assist        Transfers Overall transfer level: Needs assistance Equipment used: Rolling walker (2 wheeled) Transfers: Sit to/from Stand Sit to Stand: Min assist         General transfer comment: min A to power up to standing, balance improving with minimal posterior leaning    Balance Overall balance assessment: Needs assistance Sitting-balance support: No upper extremity supported;Feet supported Sitting balance-Leahy Scale: Good      Standing balance support: Bilateral upper extremity supported;During functional activity Standing balance-Leahy Scale: Poor Standing balance comment: reliant on B UE external support                           ADL either performed or assessed with clinical judgement   ADL Overall ADL's : Needs assistance/impaired     Grooming: Set up;Sitting;Oral care;Wash/dry face;Wash/dry hands               Lower Body Dressing: Moderate assistance;Sitting/lateral leans Lower Body Dressing Details (indicate cue type and reason): total A to doff socks, dons shoes seated EOB without assist Toilet Transfer: Minimal assistance;BSC;Ambulation;RW;Cueing for safety Toilet Transfer Details (indicate cue type and reason): patient demonstrates increased balance and activity tolerance with functional transfers this session, still requires min A due to light posterior lean against commode in standing Toileting- Clothing Manipulation and Hygiene: Moderate assistance;Sit to/from stand Toileting - Clothing Manipulation Details (indicate cue type and reason): patient min A in standing for safety and initiates peri care, however patient fatigues after approx 45 seconds and HR increasing to 160 therefore OT complete peri care      Functional mobility during ADLs: Minimal assistance;Rolling walker;Cueing for safety;Cueing for sequencing General ADL Comments: patient demonstrated increased activity tolerance and independence with self care tasks this session, HR still elevated with activity               Cognition Arousal/Alertness: Awake/alert Behavior During Therapy: WFL for tasks assessed/performed Overall Cognitive Status: Within Functional Limits for tasks assessed  General Comments O2 remain stable on room air, HR in bed 105-115, highest HR noted with activity was 160, mostly stayed between 130-140s    Pertinent Vitals/  Pain       Pain Assessment: No/denies pain         Frequency  Min 2X/week        Progress Toward Goals  OT Goals(current goals can now be found in the care plan section)  Progress towards OT goals: Progressing toward goals  Acute Rehab OT Goals Patient Stated Goal: to get stronger OT Goal Formulation: With patient Time For Goal Achievement: 08/03/19 Potential to Achieve Goals: Good ADL Goals Pt Will Perform Grooming: sitting;standing;with modified independence Pt Will Perform Upper Body Dressing: with modified independence;sitting Pt Will Perform Lower Body Dressing: with supervision;sit to/from stand;sitting/lateral leans Pt Will Transfer to Toilet: with supervision;ambulating Pt Will Perform Toileting - Clothing Manipulation and hygiene: with supervision;sitting/lateral leans;sit to/from stand  Plan Discharge plan remains appropriate       AM-PAC OT "6 Clicks" Daily Activity     Outcome Measure   Help from another person eating meals?: None Help from another person taking care of personal grooming?: A Little Help from another person toileting, which includes using toliet, bedpan, or urinal?: A Lot Help from another person bathing (including washing, rinsing, drying)?: A Lot Help from another person to put on and taking off regular upper body clothing?: A Little Help from another person to put on and taking off regular lower body clothing?: A Lot 6 Click Score: 16    End of Session Equipment Utilized During Treatment: Rolling walker  OT Visit Diagnosis: Unsteadiness on feet (R26.81);Other abnormalities of gait and mobility (R26.89);Muscle weakness (generalized) (M62.81)   Activity Tolerance Patient tolerated treatment well   Patient Left in chair;with call bell/phone within reach;with chair alarm set   Nurse Communication Mobility status        Time: 2706-2376 OT Time Calculation (min): 30 min  Charges: OT General Charges $OT Visit: 1 Visit OT  Treatments $Self Care/Home Management : 23-37 mins  Myrtie Neither OT OT office: [phone]   Carmelia Roller 07/23/2019, 11:56 AM

## 2019-07-23 NOTE — Progress Notes (Addendum)
Progress Note  Patient Name: Kendra Hughes Date of Encounter: 07/23/2019  Primary Cardiologist: Pixie Casino, MD   Subjective   Remains in A. fib with rates 80s overnight, occasional up to 110s and back down.  Patient is alert and oriented.  She denies any palpitations or chest discomfort.  She says that her breathing is better than when she arrived, still not quite back to baseline.  She says that she has put out a lot of urine although not much documented.  She is hoping to be able to go home today.  Inpatient Medications    Scheduled Meds: . diltiazem  120 mg Oral Daily  . furosemide  40 mg Oral Daily  . metoprolol tartrate  25 mg Oral TID  . Warfarin - Pharmacist Dosing Inpatient   Does not apply q1800   Continuous Infusions:  PRN Meds: acetaminophen **OR** acetaminophen, loperamide HCl, ondansetron (ZOFRAN) IV, Polyethyl Glycol-Propyl Glycol, traMADol   Vital Signs    Vitals:   07/22/19 2122 07/23/19 0414 07/23/19 0423 07/23/19 0541  BP: 113/82 112/75    Pulse: 80 (!) 104  86  Resp:  (!) 22    Temp:  98.4 F (36.9 C)    TempSrc:  Oral    SpO2:  93%  95%  Weight:   64.3 kg   Height:        Intake/Output Summary (Last 24 hours) at 07/23/2019 0653 Last data filed at 07/22/2019 1846 Gross per 24 hour  Intake 720 ml  Output 600 ml  Net 120 ml   Last 3 Weights 07/23/2019 07/21/2019 07/20/2019  Weight (lbs) 141 lb 12.8 oz 154 lb 5.2 oz 153 lb 14.1 oz  Weight (kg) 64.32 kg 70 kg 69.8 kg      Telemetry    A. fib, baseline rates in the 80s with occasional rise to the 110s- Personally Reviewed  ECG    No new tracings for review  Physical Exam   GEN: No acute distress.   Neck: No JVD Cardiac:  Irregularly irregular rhythm, no murmurs, rubs, or gallops.  Respiratory: Clear to auscultation bilaterally. GI: Soft, nontender, non-distended  MS: No edema; No deformity. Neuro:  Nonfocal  Psych: Normal affect   Labs    High Sensitivity Troponin:   Recent  Labs  Lab 07/16/19 2314 07/17/19 0106  TROPONINIHS 18* 19*      Chemistry Recent Labs  Lab 07/17/19 0106 07/17/19 0106 07/18/19 0119 07/18/19 0119 07/19/19 0316 07/19/19 0316 07/20/19 0424 07/20/19 0424 07/21/19 0754 07/22/19 0330 07/23/19 0252  NA 137   < > 136   < > 138   < > 139   < > 137 137 137  K 3.6   < > 3.8   < > 4.7   < > 4.3   < > 4.1 3.5 4.1  CL 95*   < > 97*   < > 100   < > 101   < > 99 97* 96*  CO2 31   < > 30   < > 33*   < > 31   < > 28 30 32  GLUCOSE 116*   < > 112*   < > 103*   < > 134*   < > 120* 106* 104*  BUN 20   < > 23   < > 24*   < > 26*   < > 32* 26* 23  CREATININE 0.85   < > 0.86   < > 0.87   < >  0.88   < > 0.98 0.97 0.97  CALCIUM 9.0   < > 9.1   < > 9.1   < > 9.3   < > 9.1 8.7* 9.1  PROT 6.7  --   --   --   --   --   --   --   --   --   --   ALBUMIN 3.4*   < > 3.2*  --  3.1*  --  3.0*  --   --   --   --   AST 22  --   --   --   --   --   --   --   --   --   --   ALT 19  --   --   --   --   --   --   --   --   --   --   ALKPHOS 92  --   --   --   --   --   --   --   --   --   --   BILITOT 0.9  --   --   --   --   --   --   --   --   --   --   GFRNONAA 59*   < > 58*   < > 57*   < > 56*   < > 49* 50* 50*  GFRAA >60   < > >60   < > >60   < > >60   < > 57* 58* 58*  ANIONGAP 11   < > 9   < > 5   < > 7   < > 10 10 9    < > = values in this interval not displayed.     Hematology Recent Labs  Lab 07/21/19 0754 07/22/19 0330 07/23/19 0252  WBC 11.8* 9.8 9.3  RBC 4.30 3.84* 4.04  HGB 13.2 11.9* 12.4  HCT 40.6 35.4* 38.0  MCV 94.4 92.2 94.1  MCH 30.7 31.0 30.7  MCHC 32.5 33.6 32.6  RDW 15.5 15.4 15.2  PLT 285 243 282    BNPNo results for input(s): BNP, PROBNP in the last 168 hours.   DDimer No results for input(s): DDIMER in the last 168 hours.   Radiology    DG Chest 2 View  Result Date: 07/21/2019 CLINICAL DATA:  86 year old female with history of cough. EXAM: CHEST - 2 VIEW COMPARISON:  Chest x-ray 07/16/2019. FINDINGS: Small bilateral  pleural effusions with bibasilar opacities (left greater than right), which may represent areas of atelectasis and/or consolidation. No pneumothorax. No suspicious appearing pulmonary nodules or masses are noted. No evidence of pulmonary edema. Mild cardiomegaly. The patient is rotated to the left on today's exam, resulting in distortion of the mediastinal contours and reduced diagnostic sensitivity and specificity for mediastinal pathology. Aortic atherosclerosis. IMPRESSION: 1. Small bilateral pleural effusions with areas of atelectasis and/or consolidation in the lung bases bilaterally (left greater than right). 2. Cardiomegaly. 3. Aortic atherosclerosis. Electronically Signed   By: 09/13/2019 M.D.   On: 07/21/2019 11:29    Cardiac Studies   Echocardiogram 07/17/2019  1. Left ventricular ejection fraction, by visual estimation, is 50 to 55%. The left ventricle has normal function. There is no left ventricular hypertrophy.  2. Left ventricular diastolic function could not be evaluated.  3. The left ventricle has no regional wall motion abnormalities.  4. Global right ventricle has  normal systolic function.The right ventricular size is mildly enlarged.  5. Left atrial size was mildly dilated.  6. Right atrial size was severely dilated.  7. The mitral valve is normal in structure. Mild mitral valve regurgitation. No evidence of mitral stenosis.  8. The tricuspid valve is normal in structure.  9. The aortic valve is tricuspid. Aortic valve regurgitation is not visualized. Mild aortic valve stenosis. 10. The pulmonic valve was normal in structure. Pulmonic valve regurgitation is not visualized. 11. Mildly elevated pulmonary artery systolic pressure. 12. The inferior vena cava is normal in size with greater than 50% respiratory variability, suggesting right atrial pressure of 3 mmHg. 13. Normal LV systolic function; biatrial enlargement; mild RVE; calcified aortic valve with mild AS (mean gradient  11 mmHg; visually appears moderate); mild MR; moderate TR.    Patient Profile     86 y.o. female with h/o ofpermanentafib on coumadin who was admitted with back pain and weakness being seen for afib RVR.  Assessment & Plan    Permanent atrial fibrillation, episode of RVR -Patient found to be in A. fib with RVR and started on IV diltiazem.  She has now been transitioned to Cardizem CD 120 mg daily per home dosing and rates are well controlled, 80s at baseline and up to the 110s with activity.   -Rate control has been noted to be difficult given intolerance to medications and hypotension.  Apparently digoxin did not help in the past.EP was consulted and metoprolol was increased from 25 mg twice daily to 25 mg 3 times daily.  They are not against using digoxin if needed.  Pace and ablate as an option per EP, though prefer to avoid invasive strategies if possible. -Blood pressures were in the 90s yesterday morning but were stable the rest of the day and this morning. -Per discussion with EP, Pt received a total of 210 mg of diltiazem yesterday and tolerated well.  So will increase her oral dose to 180 mg daily. -Since patient is likely going to inpatient rehab can continue metoprolol 25 mg 3 times daily however would likely send her home on an easier dosing schedule of 37.5 mg twice daily. -CHA2DS2-VASc score of 5.  She is anticoagulated with warfarin for stroke risk reduction.  Pharmacy dosing.  Chronic systolic and diastolic heart failure -Home Lasix was initially held for hypotension but is now resumed. -EF only mildly reduced on echo. -Not a candidate for invasive procedure. -Chest x-ray showed small bilateral pleural effusions and on Lasix was restarted.  Only modest urine output yesterday as documented (although patient reports that she had a lot of urine output).  Weight appears to be down about 6.5 pounds, unclear if accurate. -Continue current diuretic  UTI -Management per internal  medicine.  Completed antibiotics  Possible pneumonia -Chest x-ray negative for pneumonia     For questions or updates, please contact [facility] Please consult [email or web address] for contact info under        Signed, Berton Bon, NP  07/23/2019, 6:53 AM    Patient seen and examined with Berton Bon, NP .  Agree as above, with the following exceptions and changes as noted below.  Rate control is still not optimal, when I saw the patient her rates per 120s to 130s.  She does however state that she feels better, and is looking forward to going home from hospital in the near future.  No symptoms with transition to oral rate control yesterday.  Patient seen sitting up in bedside  chair looking alert and less fatigued. Gen: NAD, CV: Irregular rhythm, tachycardic, no murmurs, Lungs: clear, Abd: soft, Extrem: Warm, well perfused, no edema, Neuro/Psych: alert and oriented x 3, normal mood and affect. All available labs, radiology testing, previous records reviewed.   Uptitrate diltiazem today to 180 mg daily and continue metoprolol 25 mg 3 times daily. She will likely need continued adjustment of medications given overall frailty, as this has been a challenge before. Appreciate the assistance of colleagues in EP.   Parke Poisson 07/23/19 10:54 AM

## 2019-07-23 NOTE — Progress Notes (Signed)
ANTICOAGULATION CONSULT NOTE  Pharmacy Consult for warfarin Indication: atrial fibrillation   Vital Signs: Temp: 98.4 F (36.9 C) (01/15 0414) Temp Source: Oral (01/15 0414) BP: 113/87 (01/15 0825) Pulse Rate: 110 (01/15 0825)  Labs: Recent Labs    07/21/19 0342 07/21/19 0754 07/21/19 0754 07/22/19 0330 07/23/19 0252  HGB  --  13.2   < > 11.9* 12.4  HCT  --  40.6  --  35.4* 38.0  PLT  --  285  --  243 282  LABPROT 31.8*  --   --  34.1* 28.0*  INR 3.1*  --   --  3.4* 2.6*  CREATININE  --  0.98  --  0.97 0.97   < > = values in this interval not displayed.    Estimated Creatinine Clearance: 32 mL/min (by C-G formula based on SCr of 0.97 mg/dL).   Medical History: Past Medical History:  Diagnosis Date  . Arthritis    "right knee" (11/10/2015)  . Chronic diastolic (congestive) heart failure (HCC)   . DOE (dyspnea on exertion)   . DVT (deep venous thrombosis) (HCC) "years ago"  . Dyslipidemia   . Fracture of left hip (HCC) 11/09/2015   "fell"  . History of nuclear stress test 10/2009   normal exam  . Hypertension   . PAF (paroxysmal atrial fibrillation) (HCC)   . Rheumatoid arthritis (HCC) 1970   "it's in remission" (11/10/2015)     Assessment: 86 yo female on warfarin prior to admission for hx of AFib with RVR and hx of LAA thrombus. PTA warfarin: 2.5 mg po daily  INR today is therapeutic 2.6 after reduced doses and held last pm when INR was 3.4 Hgb stable, plts WNL 200 No bleeding  Noted . Eating 25-50% of documented meals. Renal function has been stable.   Goal of Therapy:  INR 2-3 Monitor platelets by anticoagulation protocol: Yes   Plan:  -Warfarin 0.5mg  x1 tonight  -Daily INR -Monitor for signs of bleeding    Leota Sauers Pharm.D. CPP, BCPS Clinical Pharmacist [phone] 07/23/2019 12:02 PM    Please check AMION for all [facility] Pharmacy phone numbers After 10:00 PM, call Main Pharmacy [phone]

## 2019-07-23 NOTE — Progress Notes (Signed)
Thank you for consult on Kendra Hughes. Chart and therapy notes reviewed. Note that patient is significantly deconditioned with limited activity tolerance. Agree with recommendations of SNF for progressive therapy. Will defer CIR consult for now.

## 2019-07-23 NOTE — Social Work (Signed)
CSW requested consult be placed for CIR per family request. If cannot go to CIR then pt has a bed at [facility] per family preference. Will need new COVID.   Octavio Graves, MSW, LCSWA  Clinical Social Work

## 2019-07-23 NOTE — Progress Notes (Signed)
PROGRESS NOTE    [name]  LKG:[id number] DOB: December 19, 1922 DOA: 07/16/2019 PCP: Carmin Richmond, MD    Brief Narrative:  [name] a86 y.o.female,w rheumatoid arthritis, hypertension, hyperlipidemia, CHF (diastolic), hx of LAA thrombus, Pafib (Chadsvasc 5, h/o remote DVT, s/p L hip fracture 2017, s/p L5 compression fracture, apparently presents with fatigue and dyspnea for the past 2-4 weeks. Pt was noted to have Afib with RVR yesterday and seen in ER in [facility] and given iv metoprolol w some improvement and sent home. Apparently seen by PCP this morning and had Afib w RVR and sent to [facility] ER for evaluation.  She is being followed by cardiology who are assisting with rate control. HR 100 - 120 during interview this AM.  She seems fatigued. Says back is ok on tramadol. PT rec is for SNF, they desire inpatient Rehab if available and approved. I talked with son to update. Says her mobility decline has been over 6 months but much more sharply in the last month. Appreciate cards assistance with a fib.  Assessment & Plan:   Principal Problem:   Atrial fibrillation with RVR (HCC) Active Problems:   HTN (hypertension)   Hyperlipidemia   Hyponatremia   Hypokalemia  Afib with RVR - back on dilt gtt; continuing home metoprolol - followed by cards; has had difficulty with amiodarone and dig in the past - echo: EF 50-55%, RA/LAseverely dilated - mildly elevated trp; denies CP - pharm dosing coumadin -appreciate cards help: dilt gtt, metoprolol, coumadin     - checking CXR, Procal due to WBC up and slight cough, could this be worsening Afib             - CXR with small effusion possible left base consolidation, but Procal is normal      - anticipate EP consult today 1/14 Increasing PO meds cardizem is up to 180/day and metoprolol at 25 mg bid  Hypokalemia Hyponatremia - labs ok. Monitor.--4.1 tody  Acute lower UTI (citrobacter freundi) -  UCx positive for citrobacter - she has completed her abx Rocephin for UTI, denies any further symptoms; WBC up, but she is afebrile and aSx; monitor  Chronic diastolic HF with some fluid overload - continue home lasix 1/13; fluid status ok overall but effusions on CXR 1/13, monitor O2 is 93-95% on RA  Chronic back pain - long history with back Fx, she says she's had multiple visits to the spine center for her back pain; son confirms - PRNs available - PT/OT eval noted; updated son, he will speak with pt--will attempt inpt rehab   DVT prophylaxis: [id number] INR is in range Code Status: Full code  Family Communication: Son Aneta Mins, called and LM on Voice mail, stable condition, inpatient rehab consult obtained. Disposition Plan: CIR?   Consultants:   Cardiology  Procedures:  None  Antimicrobials:  Anti-infectives (From admission, onward)   Start     Dose/Rate Route Frequency Ordered Stop   07/17/19 1500  cefdinir (OMNICEF) capsule 300 mg  Status:  Discontinued     300 mg Oral Every 12 hours 07/17/19 1449 07/19/19 0844   07/16/19 2200  cefTRIAXone (ROCEPHIN) 1 g in sodium chloride 0.9 % 100 mL IVPB  Status:  Discontinued     1 g 200 mL/hr over 30 Minutes Intravenous Every 24 hours 07/16/19 2151 07/17/19 1449      Subjective: Feels well. Does not feel when her rate is up. HR up around 130 this am, while sitting in a chair.  Denies CP, SOB today  Objective: Vitals:   07/23/19 0414 07/23/19 0423 07/23/19 0541 07/23/19 0825  BP: 112/75   113/87  Pulse: (!) 104  86 (!) 110  Resp: (!) 22     Temp: 98.4 F (36.9 C)     TempSrc: Oral     SpO2: 93%  95%   Weight:  64.3 kg    Height:        Intake/Output Summary (Last 24 hours) at 07/23/2019 0955 Last data filed at 07/23/2019 0926 Gross per 24 hour  Intake 720 ml  Output 600 ml  Net 120 ml   Filed Weights   07/20/19 0552 07/21/19 0445 07/23/19 0423  Weight: 69.8 kg 70 kg 64.3 kg     Examination:  General exam: Appears calm and comfortable  Respiratory system: fine crackles at bases. Respiratory effort normal. Cardiovascular system: S1 & S2 heard, irregularly irregular, tachy Gastrointestinal system: Abdomen is nondistended, soft and nontender.  Central nervous system: Alert. No focal neurological deficits. Extremities: Symmetric  Skin: No rashes  Data Reviewed: I have personally reviewed following labs and imaging studies  CBC: Recent Labs  Lab 07/18/19 0119 07/18/19 0119 07/19/19 0316 07/20/19 0424 07/21/19 0754 07/22/19 0330 07/23/19 0252  WBC 9.1   < > 8.9 11.0* 11.8* 9.8 9.3  NEUTROABS 7.3  --  7.3 9.3*  --   --   --   HGB 12.9   < > 13.0 12.7 13.2 11.9* 12.4  HCT 39.1   < > 39.3 39.5 40.6 35.4* 38.0  MCV 92.4   < > 93.3 95.0 94.4 92.2 94.1  PLT 268   < > 214 269 285 243 282   < > = values in this interval not displayed.   Basic Metabolic Panel: Recent Labs  Lab 07/17/19 0106 07/17/19 0106 07/18/19 0119 07/18/19 0119 07/19/19 0316 07/20/19 0424 07/21/19 0754 07/22/19 0330 07/23/19 0252  NA 137   < > 136   < > 138 139 137 137 137  K 3.6   < > 3.8   < > 4.7 4.3 4.1 3.5 4.1  CL 95*   < > 97*   < > 100 101 99 97* 96*  CO2 31   < > 30   < > 33* 31 28 30  32  GLUCOSE 116*   < > 112*   < > 103* 134* 120* 106* 104*  BUN 20   < > 23   < > 24* 26* 32* 26* 23  CREATININE 0.85   < > 0.86   < > 0.87 0.88 0.98 0.97 0.97  CALCIUM 9.0   < > 9.1   < > 9.1 9.3 9.1 8.7* 9.1  MG 2.2  --  2.1  --  2.2 2.2  --   --   --   PHOS  --   --  3.6  --  3.3 3.5  --   --   --    < > = values in this interval not displayed.   GFR: Estimated Creatinine Clearance: 32 mL/min (by C-G formula based on SCr of 0.97 mg/dL). Liver Function Tests: Recent Labs  Lab 07/17/19 0106 07/18/19 0119 07/19/19 0316 07/20/19 0424  AST 22  --   --   --   ALT 19  --   --   --   ALKPHOS 92  --   --   --   BILITOT 0.9  --   --   --   PROT 6.7  --   --   --  ALBUMIN 3.4* 3.2*  3.1* 3.0*   Coagulation Profile: Recent Labs  Lab 07/19/19 0316 07/20/19 0424 07/21/19 0342 07/22/19 0330 07/23/19 0252  INR 2.5* 3.2* 3.1* 3.4* 2.6*   Sepsis Labs: Recent Labs  Lab 07/21/19 1028  PROCALCITON <0.10    Recent Results (from the past 240 hour(s))  Respiratory Panel by RT PCR (Flu A&B, Covid) -     Status: None   Collection Time: 07/16/19  8:27 PM  Result Value Ref Range Status   SARS Coronavirus 2 by RT PCR NEGATIVE NEGATIVE Final    Comment: (NOTE) SARS-CoV-2 target nucleic acids are NOT DETECTED. The SARS-CoV-2 RNA is generally detectable in upper respiratoy specimens during the acute phase of infection. The lowest concentration of SARS-CoV-2 viral copies this assay can detect is 131 copies/mL. A negative result does not preclude SARS-Cov-2 infection and should not be used as the sole basis for treatment or other patient management decisions. A negative result may occur with  improper specimen collection/handling, submission of specimen other than nasopharyngeal swab, presence of viral mutation(s) within the areas targeted by this assay, and inadequate number of viral copies (<131 copies/mL). A negative result must be combined with clinical observations, patient history, and epidemiological information. The expected result is Negative. Fact Sheet for Patients:  https://www.moore.com/ Fact Sheet for Healthcare Providers:  https://www.young.biz/ This test is not yet ap proved or cleared by the [place] FDA and  has been authorized for detection and/or diagnosis of SARS-CoV-2 by FDA under an Emergency Use Authorization (EUA). This EUA will remain  in effect (meaning this test can be used) for the duration of the COVID-19 declaration under Section 564(b)(1) of the Act, 21 U.S.C. section 360bbb-3(b)(1), unless the authorization is terminated or revoked sooner.    Influenza A by PCR NEGATIVE NEGATIVE Final    Influenza B by PCR NEGATIVE NEGATIVE Final    Comment: (NOTE) The Xpert Xpress SARS-CoV-2/FLU/RSV assay is intended as an aid in  the diagnosis of influenza from Nasopharyngeal swab specimens and  should not be used as a sole basis for treatment. Nasal washings and  aspirates are unacceptable for Xpert Xpress SARS-CoV-2/FLU/RSV  testing. Fact Sheet for Patients: https://www.moore.com/ Fact Sheet for Healthcare Providers: https://www.young.biz/ This test is not yet approved or cleared by the [place] FDA and  has been authorized for detection and/or diagnosis of SARS-CoV-2 by  FDA under an Emergency Use Authorization (EUA). This EUA will remain  in effect (meaning this test can be used) for the duration of the  Covid-19 declaration under Section 564(b)(1) of the Act, 21  U.S.C. section 360bbb-3(b)(1), unless the authorization is  terminated or revoked. Performed at [facility], [address] [place], [place] [address]       Radiology Studies: DG Chest 2 View  Result Date: 07/21/2019 CLINICAL DATA:  86 year old female with history of cough. EXAM: CHEST - 2 VIEW COMPARISON:  Chest x-ray 07/16/2019. FINDINGS: Small bilateral pleural effusions with bibasilar opacities (left greater than right), which may represent areas of atelectasis and/or consolidation. No pneumothorax. No suspicious appearing pulmonary nodules or masses are noted. No evidence of pulmonary edema. Mild cardiomegaly. The patient is rotated to the left on today's exam, resulting in distortion of the mediastinal contours and reduced diagnostic sensitivity and specificity for mediastinal pathology. Aortic atherosclerosis. IMPRESSION: 1. Small bilateral pleural effusions with areas of atelectasis and/or consolidation in the lung bases bilaterally (left greater than right). 2. Cardiomegaly. 3. Aortic atherosclerosis. Electronically Signed   By: Reuel Boom  Entrikin M.D.   On:  07/21/2019 11:29     Scheduled Meds: . diltiazem  180 mg Oral Daily  . furosemide  40 mg Oral Daily  . metoprolol tartrate  25 mg Oral TID  . Warfarin - Pharmacist Dosing Inpatient   Does not apply q1800   Continuous Infusions:   LOS: 7 days    Reva Bores, MD 07/23/2019 9:55 AM [phone] [facility] If 7PM-7AM, please contact night-coverage 07/23/2019, 9:55 AM

## 2019-07-23 NOTE — TOC Progression Note (Signed)
Transition of Care [facility]) - Progression Note    Patient Details  Name: Kendra Hughes MRN: [id number] Date of Birth: Jan 14, 1923  Transition of [facility]) CM/SW Contact  Doy Hutching, [place] Phone Number: 07/23/2019, 11:04 AM  Clinical Narrative:    11:04am- CSW spoke with pt son Kendra Hughes, we discussed that CIR PA/MD have reviewed clinical information and pt appropriateness for CIR and have recommended that SNF be pursued. Pt son not pleased, states his father (pt husband) also will not be pleased. Pt son requested to speak with someone through inpatient rehab. We discussed that I would reach out and see if someone is available to discuss.   We also discussed that pt has a bed at [facility] should CIR continue to not feel pt is appropriate and that we would need to confirm that placement with Kendra Hughes in admissions today before the weekend.   Pt son continues to ask to speak with CIR. Also requested that his number also be edited to include his cell phone number as the number we have is his home phone. Number [phone] was added as pt son's cell phone.   11:48am- CSW aware that pt son has spoken with CIR, pt still not a candidate. [facility] does have bed, pt will need new COVID. They can accept pt over the weekend if resulted and pt stable.   12:15pm- CSW spoke with pt son Kendra Hughes, he is amenable to placement at [facility], understands that we will likely transfer pt tomorrow if she remains medically stable and new COVID results. They would like to see pt in the morning and we discussed transfer after lunch time.   All information for weekend transfer left in hand off for weekend CSW. PTAR papers completed and left on pt chart.    Expected Discharge Plan: Skilled Nursing Facility Barriers to Discharge: Continued Medical Work up  Expected Discharge Plan and Services Expected Discharge Plan: Skilled Nursing Facility In-house Referral: Clinical Social Work Discharge  Planning Services: CM Consult Post Acute Care Choice: Skilled Nursing Facility, IP Rehab Living arrangements for the past 2 months: Single Family Home  Readmission Risk Interventions Readmission Risk Prevention Plan 07/22/2019  Transportation Screening Complete  PCP or Specialist Appt within 5-7 Days Complete  Home Care Screening Complete  Medication Review (RN CM) Complete  Some recent data might be hidden

## 2019-07-23 NOTE — Progress Notes (Signed)
Inpatient Rehabilitation Admissions Coordinator  I spoke with pt's son, Aneta Mins, by phone and he is aware that his Mom is not a candidate for CIR admit at this time. She was admitted to CIR 10 years ago and he had preferred CIR rather than SNF but understands that she is currently not a candidate nor is there a bed available for her at this time. Sw, Rutherford Nail is aware. We will sign off at this time.  Ottie Glazier, RN, MSN Rehab Admissions Coordinator [phone] 07/23/2019 11:44 AM

## 2019-07-24 DIAGNOSIS — G8929 Other chronic pain: Secondary | ICD-10-CM | POA: Diagnosis present

## 2019-07-24 DIAGNOSIS — R54 Age-related physical debility: Secondary | ICD-10-CM | POA: Diagnosis present

## 2019-07-24 LAB — BASIC METABOLIC PANEL
Anion gap: 13 (ref 5–15)
BUN: 23 mg/dL (ref 8–23)
CO2: 32 mmol/L (ref 22–32)
Calcium: 9.5 mg/dL (ref 8.9–10.3)
Chloride: 92 mmol/L — ABNORMAL LOW (ref 98–111)
Creatinine, Ser: 0.92 mg/dL (ref 0.44–1.00)
GFR calc Af Amer: >60 mL/min (ref >60)
GFR calc non Af Amer: 53 mL/min — ABNORMAL LOW (ref >60)
Glucose, Bld: 107 mg/dL — ABNORMAL HIGH (ref 70–99)
Potassium: 3.5 mmol/L (ref 3.5–5.1)
Sodium: 137 mmol/L (ref 135–145)

## 2019-07-24 LAB — CBC
HCT: 44.1 % (ref 36.0–46.0)
Hemoglobin: 14.5 g/dL (ref 12.0–15.0)
MCH: 30.6 pg (ref 26.0–34.0)
MCHC: 32.9 g/dL (ref 30.0–36.0)
MCV: 93 fL (ref 80.0–100.0)
Platelets: 319 10*3/uL (ref 150–400)
RBC: 4.74 MIL/uL (ref 3.87–5.11)
RDW: 15.4 % (ref 11.5–15.5)
WBC: 9.7 10*3/uL (ref 4.0–10.5)
nRBC: 0 % (ref 0.0–0.2)

## 2019-07-24 LAB — PROTIME-INR
INR: 1.8 — ABNORMAL HIGH (ref 0.8–1.2)
Prothrombin Time: 20.9 seconds — ABNORMAL HIGH (ref 11.4–15.2)

## 2019-07-24 MED ORDER — METOPROLOL TARTRATE 25 MG PO TABS: 25.0000 mg | ORAL_TABLET | Freq: Three times a day (TID) | ORAL | 0 refills | Status: DC

## 2019-07-24 MED ORDER — DILTIAZEM HCL ER COATED BEADS 240 MG PO CP24: 240.0000 mg | ORAL_CAPSULE | Freq: Every day | ORAL | 0 refills | Status: DC

## 2019-07-24 MED ORDER — TRAMADOL HCL 50 MG PO TABS: 100.0000 mg | ORAL_TABLET | Freq: Four times a day (QID) | ORAL | 0 refills | Status: DC | PRN

## 2019-07-24 MED ORDER — DILTIAZEM HCL ER COATED BEADS 240 MG PO CP24
240.0000 mg | ORAL_CAPSULE | Freq: Every day | ORAL | Status: DC
  Administered 2019-07-24: 10:00:00 240 mg via ORAL
  Filled 2019-07-24: qty 1

## 2019-07-24 MED ORDER — WARFARIN SODIUM 3 MG PO TABS
3.0000 mg | ORAL_TABLET | Freq: Once | ORAL | Status: AC
  Administered 2019-07-24: 19:00:00 3 mg via ORAL
  Filled 2019-07-24: qty 3
  Filled 2019-07-24: qty 1

## 2019-07-24 MED ORDER — ACETAMINOPHEN 325 MG PO TABS: 650.0000 mg | ORAL_TABLET | Freq: Four times a day (QID) | ORAL | 0 refills | Status: DC | PRN

## 2019-07-24 MED ORDER — WARFARIN SODIUM 3 MG PO TABS: 3.0000 mg | ORAL_TABLET | Freq: Once | ORAL | 0 refills | Status: DC

## 2019-07-24 MED ORDER — DILTIAZEM HCL ER COATED BEADS 180 MG PO CP24: 180.0000 mg | ORAL_CAPSULE | Freq: Every day | ORAL | 0 refills | Status: DC

## 2019-07-24 NOTE — TOC Transition Note (Signed)
Transition of Care [facility]) - CM/SW Discharge Note   Patient Details  Name: Kendra Hughes MRN: [id number] Date of Birth: Jan 07, 1923  Transition of Care [facility]) CM/SW Contact:  Patrice Paradise, LCSW Phone Number:[phone] 07/24/2019, 1:13 PM   Clinical Narrative:     Patient will DC to:? Anticipated DC date:? Family notified:? Transport by:   Per MD patient ready for DC to [facility]  RN, patient, patient's family, and facility notified of DC. Discharge Summary sent to facility. RN given number for report [phone] Beth RN DC packet on chart. Ambulance transport requested for patient.   CSW signing off.   Judd Lien, [place] [phone]   Final next level of care: Skilled Nursing Facility Barriers to Discharge: No Barriers Identified   Patient Goals and CMS Choice   CMS Medicare.gov Compare Post Acute Care list provided to:: Patient Represenative (must comment) Choice offered to / list presented to : Adult Children  Discharge Placement              Patient chooses bed at: [facility] Patient to be transferred to facility by: PTAR Name of family member notified: son Patient and family notified of of transfer: 07/24/19  Discharge Plan and Services In-house Referral: Clinical Social Work Discharge Planning Services: CM Consult Post Acute Care Choice: Skilled Nursing Facility, IP Rehab                               Social Determinants of Health (SDOH) Interventions     Readmission Risk Interventions Readmission Risk Prevention Plan 07/22/2019  Transportation Screening Complete  PCP or Specialist Appt within 5-7 Days Complete  Home Care Screening Complete  Medication Review (RN CM) Complete  Some recent data might be hidden

## 2019-07-24 NOTE — Progress Notes (Signed)
Progress Note  Patient Name: Kendra Hughes Date of Encounter: 07/24/2019  Primary Cardiologist: Pixie Casino, MD   Subjective   Mains in atrial fibrillation.  Heart rates are in the 80s to as high as 140.  She feels well, but has a little phlegm that she has been coughing up.  Otherwise no complaints.    Inpatient Medications    Scheduled Meds: . diltiazem  240 mg Oral Daily  . furosemide  40 mg Oral Daily  . metoprolol tartrate  25 mg Oral TID  . warfarin  3 mg Oral ONCE-1800  . Warfarin - Pharmacist Dosing Inpatient   Does not apply q1800   Continuous Infusions:  PRN Meds: acetaminophen **OR** acetaminophen, loperamide HCl, ondansetron (ZOFRAN) IV, Polyethyl Glycol-Propyl Glycol, traMADol   Vital Signs    Vitals:   07/23/19 1506 07/23/19 2105 07/23/19 2131 07/24/19 0405  BP:  102/66 100/68 137/85  Pulse:  97 100 64  Resp:  17  16  Temp: 98 F (36.7 C) 98 F (36.7 C)  (!) 97.3 F (36.3 C)  TempSrc: Oral Oral  Oral  SpO2:  96%  92%  Weight:    65.5 kg  Height:        Intake/Output Summary (Last 24 hours) at 07/24/2019 0950 Last data filed at 07/23/2019 1500 Gross per 24 hour  Intake 120 ml  Output --  Net 120 ml   Last 3 Weights 07/24/2019 07/23/2019 07/21/2019  Weight (lbs) 144 lb 4.8 oz 141 lb 12.8 oz 154 lb 5.2 oz  Weight (kg) 65.454 kg 64.32 kg 70 kg      Telemetry    Atrial fibrillation-personally reviewed  ECG    None new for review  Physical Exam   GEN: Well nourished, well developed, in no acute distress  HEENT: normal  Neck: no JVD, carotid bruits, or masses Cardiac: Irregular, tachycardic; no murmurs, rubs, or gallops,no edema  Respiratory:  clear to auscultation bilaterally, normal work of breathing GI: soft, nontender, nondistended, + BS MS: no deformity or atrophy  Skin: warm and dry Neuro:  Strength and sensation are intact Psych: euthymic mood, full affect   Labs    High Sensitivity Troponin:   Recent Labs  Lab  07/16/19 2314 07/17/19 0106  TROPONINIHS 18* 19*      Chemistry Recent Labs  Lab 07/18/19 0119 07/18/19 0119 07/19/19 0316 07/19/19 0316 07/20/19 0424 07/21/19 0754 07/22/19 0330 07/23/19 0252 07/24/19 0421  NA 136   < > 138   < > 139   < > 137 137 137  K 3.8   < > 4.7   < > 4.3   < > 3.5 4.1 3.5  CL 97*   < > 100   < > 101   < > 97* 96* 92*  CO2 30   < > 33*   < > 31   < > 30 32 32  GLUCOSE 112*   < > 103*   < > 134*   < > 106* 104* 107*  BUN 23   < > 24*   < > 26*   < > 26* 23 23  CREATININE 0.86   < > 0.87   < > 0.88   < > 0.97 0.97 0.92  CALCIUM 9.1   < > 9.1   < > 9.3   < > 8.7* 9.1 9.5  ALBUMIN 3.2*  --  3.1*  --  3.0*  --   --   --   --  GFRNONAA 58*   < > 57*   < > 56*   < > 50* 50* 53*  GFRAA >60   < > >60   < > >60   < > 58* 58* >60  ANIONGAP 9   < > 5   < > 7   < > 10 9 13    < > = values in this interval not displayed.     Hematology Recent Labs  Lab 07/22/19 0330 07/23/19 0252 07/24/19 0421  WBC 9.8 9.3 9.7  RBC 3.84* 4.04 4.74  HGB 11.9* 12.4 14.5  HCT 35.4* 38.0 44.1  MCV 92.2 94.1 93.0  MCH 31.0 30.7 30.6  MCHC 33.6 32.6 32.9  RDW 15.4 15.2 15.4  PLT 243 282 319    BNPNo results for input(s): BNP, PROBNP in the last 168 hours.   DDimer No results for input(s): DDIMER in the last 168 hours.   Radiology    No results found.  Cardiac Studies   Echocardiogram 07/17/2019  1. Left ventricular ejection fraction, by visual estimation, is 50 to 55%. The left ventricle has normal function. There is no left ventricular hypertrophy.  2. Left ventricular diastolic function could not be evaluated.  3. The left ventricle has no regional wall motion abnormalities.  4. Global right ventricle has normal systolic function.The right ventricular size is mildly enlarged.  5. Left atrial size was mildly dilated.  6. Right atrial size was severely dilated.  7. The mitral valve is normal in structure. Mild mitral valve regurgitation. No evidence of mitral  stenosis.  8. The tricuspid valve is normal in structure.  9. The aortic valve is tricuspid. Aortic valve regurgitation is not visualized. Mild aortic valve stenosis. 10. The pulmonic valve was normal in structure. Pulmonic valve regurgitation is not visualized. 11. Mildly elevated pulmonary artery systolic pressure. 12. The inferior vena cava is normal in size with greater than 50% respiratory variability, suggesting right atrial pressure of 3 mmHg. 13. Normal LV systolic function; biatrial enlargement; mild RVE; calcified aortic valve with mild AS (mean gradient 11 mmHg; visually appears moderate); mild MR; moderate TR.    Patient Profile     86 y.o. female with h/o ofpermanentafib on coumadin who was admitted with back pain and weakness being seen for afib RVR.  Assessment & Plan    Permanent atrial fibrillation, episode of RVR Currently on p.o. diltiazem at 180 mg as well as 25 mg of metoprolol 3 times daily.  At discharge, this metoprolol can be switched to 37.5 mg twice a day.  I have increased her diltiazem to 240 mg.  This can be titrated up as her blood pressure allows.  At this point, if she continues to feel well and has heart rates between 90 and 110 average, she would be able to be discharged with follow-up in cardiology clinic.  Would continue warfarin for CHA2DS2-VASc of 5.  Chronic systolic and diastolic heart failure Ejection fraction mildly reduced by echo.  Lasix has been restarted.  Continue diuretics.  Further diuresis Kayce Betty likely help with rate control.  UTI Antibiotics.  Management per internal medicine.    For questions or updates, please contact [facility] Please consult [email or web address] for contact info under        Signed, Chanz Cahall 89, MD  07/24/2019, 9:50 AM

## 2019-07-24 NOTE — Progress Notes (Addendum)
ANTICOAGULATION CONSULT NOTE  Pharmacy Consult for warfarin Indication: atrial fibrillation (CHADS2VASc = 5) , h/o LAA thrombus and DVT  Vital Signs: Temp: 97.3 F (36.3 C) (01/16 0405) Temp Source: Oral (01/16 0405) BP: 137/85 (01/16 0405) Pulse Rate: 64 (01/16 0405)  Labs: Recent Labs    07/22/19 0330 07/22/19 0330 07/23/19 0252 07/24/19 0421  HGB 11.9*   < > 12.4 14.5  HCT 35.4*  --  38.0 44.1  PLT 243  --  282 319  LABPROT 34.1*  --  28.0* 20.9*  INR 3.4*  --  2.6* 1.8*  CREATININE 0.97  --  0.97 0.92   < > = values in this interval not displayed.    Estimated Creatinine Clearance: 34 mL/min (by C-G formula based on SCr of 0.92 mg/dL).   Assessment: 86 yo female on warfarin prior to admission for hx of Afib with RVR (CHADS2VASc = 5)  and hx of LAA thrombus and DVT. PTA warfarin: 2.5 mg po daily  INR rapid drop 3.4 >2.6 > 1.8 due to dose decrease x4 days. Eating 20-25%. H/H, plt stable  Goal of Therapy:  INR 2-3 Monitor platelets by anticoagulation protocol: Yes   Plan:  Warfarin 3mg  x1 tonight  Daily INR, CBC, Monitor for signs of bleeding  If discharged to SNF, suggest 2mg  daily with INR check by 07/28/19    , PharmD, BCPS, BCCP Clinical Pharmacist  Please check AMION for all [facility] Pharmacy phone numbers After 10:00 PM, call Main Pharmacy [phone]

## 2019-07-24 NOTE — Discharge Summary (Addendum)
Discharge Summary  Kendra Hughes ZOX:[id number] DOB: 1923/05/08  PCP: Carmin Richmond, MD  Admit date: 07/16/2019 Discharge date: 07/24/2019  Time spent: 31 minutes  Recommendations for Outpatient Follow-up:  1. Physical therapy SNF  Discharge Diagnoses:  Active Hospital Problems   Diagnosis Date Noted  . Atrial fibrillation with RVR (HCC)   . Age-related physical debility 07/24/2019  . Chronic back pain 07/24/2019  . Hypokalemia 07/16/2019  . Hyponatremia   . HTN (hypertension) 02/12/2013  . Hyperlipidemia 02/12/2013    Resolved Hospital Problems  No resolved problems to display.    Discharge Condition: Improved  Diet recommendation: Cardiac  Vitals:   07/23/19 2131 07/24/19 0405  BP: 100/68 137/85  Pulse: 100 64  Resp:  16  Temp:  (!) 97.3 F (36.3 C)  SpO2:  92%    History of present illness:  Kendra Hughes a86 y.o.female,w rheumatoid arthritis, hypertension, hyperlipidemia, CHF (diastolic), hx of LAA thrombus, Pafib (Chadsvasc 5, h/o remote DVT, s/p L hip fracture 2017, s/p L5 compression fracture, apparently presents with fatigue and dyspnea for the past 2-4 weeks. Pt was noted to have Afib with RVR yesterday and seen in ER in [facility] and given iv metoprolol w some improvement and sent home. Apparently seen by PCP  and had Afib w RVR and sent to [facility] ER for evaluation.  She is being followed by cardiology who are assisting with rate control. HR 100 - 120 during interview this AM. She seems fatigued. Says back is ok on tramadol. PT rec is for SNF, they desire inpatient Rehab if available and approved. SHE WAS NOT APPROVED FOR CIR.  Her mobility decline has been over 6 months but much more sharply in the last month.   Hospital Course:  Principal Problem:   Atrial fibrillation with RVR (HCC) Active Problems:   HTN (hypertension)   Hyperlipidemia   Hyponatremia   Hypokalemia   Age-related physical debility   Chronic back pain  Patient was admitted for  atrial fibrillation with rapid ventricular rate having been sent from her primary care provider due to not responding to outpatient treatment.  Upon admission she was admitted to progressive unit and started on diltiazem drip which was switched to p.o. and progressively titrated upwards.  Today she was switched to 240 mg daily.  Her ejection fraction was 50 to 51% with R AAS/LICA dilated Tatian her troponin was mildly elevated but she denies any chest pain.  She was also on Coumadin which is being adjusted by pharmacy she was also placed on her home dose of metoprolol she was found to have both hyponatremia and hypokalemia which were  respectively replaced.  She was found to have urinary tract infection that was treated with Rocephin which she has completed her symptoms have resolved.  For her chronic diastolic heart failure she had some fluid overload she was continued on home Lasix with fluid restriction and her ejection fraction as already stated was 50 to 55%.  Patient has a long history of chronic back pain with multiple surgeries and need to back history of back strain fracture she received PT OT evaluation she was started on tramadol for pain control which helped with her pain she is being discharged with some tramadol.  Patient has benefited maximally from this hospital stay and is being discharged to SNF for further management and rehab.  She did not meet criteria for CIR   Procedures:  None  Consultations:  Cardiology   Discharge Exam: BP 137/85 (BP Location:  Left Arm)   Pulse 64   Temp (!) 97.3 F (36.3 C) (Oral)   Resp 16   Ht 5\' 3"  (1.6 m)   Wt 65.5 kg   SpO2 92%   BMI 25.56 kg/m   General: aox3 pleasant  No distress Cardiovascular: rate regular rhthym irregular  Respiratory: Clear to auscultation bilareally Lower extremity.  There was no edema  Discharge Instructions You were cared for by a hospitalist during your hospital stay. If you have any questions about your  discharge medications or the care you received while you were in the hospital after you are discharged, you can call the unit and asked to speak with the hospitalist on call if the hospitalist that took care of you is not available. Once you are discharged, your primary care physician will handle any further medical issues. Please note that NO REFILLS for any discharge medications will be authorized once you are discharged, as it is imperative that you return to your primary care physician (or establish a relationship with a primary care physician if you do not have one) for your aftercare needs so that they can reassess your need for medications and monitor your lab values.  Last Labs   No results for input(s): GLUCAP in the last 168 hours.    Fill Date ID   Written Drug Qty Days Prescriber Rx # Pharmacy Refill   Daily Dose* Pymt Type PMP    06/25/2019  1   06/25/2019  Oxycodone Hcl 5 MG Tablet  60.00  5 Ja Dav   06/27/2019   Sil (2183)   0  90.00 MME  Medicare   [place]  06/22/2019  1   06/22/2019  Oxycodone Hcl 5 MG Tablet  20.00  2 Ja Dav   06/24/2019   Sil (2183)   0  75.00 MME  Medicare   [place]  12/18/2018  2   12/17/2018  Tramadol Hcl 50 MG Tablet  90.00  30 Ja Dav   02/16/2019   [place] (9851)   0  15.00 MME  Medicare   [place]  05/28/2018  1   04/16/2018  Tramadol Hcl 50 MG Tablet  90.00  30 Ja Dav   06/16/2018   Sil (2183)   1  15.00 MME  Medicare   [place]  04/16/2018  1   04/16/2018  Tramadol Hcl 50 MG Tablet  21.00  7 Ja Dav   06/16/2018   Sil (2183)   0  15.00 MME  Medicare   [place]  09/05/2017  1   06/12/2017  Tramadol Hcl 50 MG Tablet  90.00  30 Ja Dav   14/12/2016   Sil (2183)   1  15.00 MME  Medicare     *Per CDC guidance, the MME conversion factors prescribed      Discharge Instructions    Call MD for:  severe uncontrolled pain   Complete by: As directed    Diet - low sodium heart healthy   Complete by: As directed    Discharge instructions   Complete by: As directed    Discharge to SNF for ongoing physical  therapy and management.  Physical therapy and rehab.  SNF   Increase activity slowly   Complete by: As directed    Per physical therapy     Allergies as of 07/24/2019      Reactions   Amiodarone Other (See Comments)   Tremor, muscle weakness      Medication List    STOP taking  these medications   acetaminophen 650 MG CR tablet Commonly known as: TYLENOL Replaced by: acetaminophen 325 MG tablet     TAKE these medications   acetaminophen 325 MG tablet Commonly known as: TYLENOL Take 2 tablets (650 mg total) by mouth every 6 (six) hours as needed for mild pain (or Fever >/= 101). Replaces: acetaminophen 650 MG CR tablet   diltiazem 240 MG 24 hr capsule Commonly known as: CARDIZEM CD Take 1 capsule (240 mg total) by mouth daily. Start taking on: July 25, 2019 What changed:   medication strength  how much to take   furosemide 40 MG tablet Commonly known as: LASIX TAKE 1 TABLET EVERY DAY  (KEEP APPOINTMENT FOR REFILLS) What changed: See the new instructions.   metoprolol tartrate 25 MG tablet Commonly known as: LOPRESSOR Take 1 tablet (25 mg total) by mouth 3 (three) times daily. What changed: See the new instructions.   SYSTANE OP Place 1 drop into both eyes daily as needed (dry eyes).   traMADol 50 MG tablet Commonly known as: ULTRAM Take 2 tablets (100 mg total) by mouth every 6 (six) hours as needed for moderate pain or severe pain.   Vitamin D (Ergocalciferol) 1.25 MG (50000 UNIT) Caps capsule Commonly known as: DRISDOL Take 50,000 Units by mouth every Monday.   warfarin 3 MG tablet Commonly known as: COUMADIN Take 1 tablet (3 mg total) by mouth one time only at 6 PM. What changed:   medication strength  how much to take  when to take this  additional instructions  Another medication with the same name was removed. Continue taking this medication, and follow the directions you see here.      Allergies  Allergen Reactions  . Amiodarone Other  (See Comments)    Tremor, muscle weakness    Contact information for follow-up providers    Kroeger, Krista M., PA-C Follow up.   Specialty: Physician Assistant Why: Cardiology hospital follow-up on 08/19/2019 at 2:45 PM.  Please arrive 15 minutes early for check-in. Contact information: [address] [place] [address] [phone]            Contact information for after-discharge care    Destination    [facility] .   Service: Skilled Nursing Contact information: [address] [place] [address] [phone]                   The results of significant diagnostics from this hospitalization (including imaging, microbiology, ancillary and laboratory) are listed below for reference.    Significant Diagnostic Studies: DG Chest 2 View  Result Date: 07/21/2019 CLINICAL DATA:  86 year old female with history of cough. EXAM: CHEST - 2 VIEW COMPARISON:  Chest x-ray 07/16/2019. FINDINGS: Small bilateral pleural effusions with bibasilar opacities (left greater than right), which may represent areas of atelectasis and/or consolidation. No pneumothorax. No suspicious appearing pulmonary nodules or masses are noted. No evidence of pulmonary edema. Mild cardiomegaly. The patient is rotated to the left on today's exam, resulting in distortion of the mediastinal contours and reduced diagnostic sensitivity and specificity for mediastinal pathology. Aortic atherosclerosis. IMPRESSION: 1. Small bilateral pleural effusions with areas of atelectasis and/or consolidation in the lung bases bilaterally (left greater than right). 2. Cardiomegaly. 3. Aortic atherosclerosis. Electronically Signed   By: Trudie Reed M.D.   On: 07/21/2019 11:29   DG Chest 2 View  Result Date: 07/16/2019 CLINICAL DATA:  Atrial fibrillation EXAM: CHEST - 2 VIEW COMPARISON:  07/15/2019 FINDINGS: Cardiac shadow is mildly enlarged. Aortic calcifications  are again seen. Patient is rotated to the left accentuating the mediastinal markings. No focal infiltrate or sizable effusion is seen. No acute bony abnormality is noted. Chronic compression deformity in the lower thoracic spine is seen. IMPRESSION: Chronic changes without acute abnormality. Electronically Signed   By: Alcide Clever M.D.   On: 07/16/2019 17:54   ECHOCARDIOGRAM COMPLETE  Result Date: 07/17/2019   ECHOCARDIOGRAM REPORT   Patient Name:   Kendra Hughes Date of Exam: 07/17/2019 Medical Rec #:  [id number]    Height:       63.0 in Accession #:    [id number]   Weight:       146.0 lb Date of Birth:  Nov 20, 1922    BSA:          1.69 m Patient Age:    86 years     BP:           123/98 mmHg Patient Gender: F            HR:           129 bpm. Exam Location:  Inpatient Procedure: 2D Echo                             MODIFIED REPORT: This report was modified by Olga Millers MD on 07/17/2019 due to Change.  Indications:     Atrial Fibrillation 427.31 / I48.91  History:         Patient has prior history of Echocardiogram examinations, most                  recent 11/15/2015. Arrythmias:Tachycardia,                  Signs/Symptoms:Hypotension and Shortness of Breath; Risk                  Factors:Hypertension and Dyslipidemia.  Sonographer:     Leda Min Referring Phys:  [id number] Diagnosing Phys: Olga Millers MD IMPRESSIONS  1. Left ventricular ejection fraction, by visual estimation, is 50 to 55%. The left ventricle has normal function. There is no left ventricular hypertrophy.  2. Left ventricular diastolic function could not be evaluated.  3. The left ventricle has no regional wall motion abnormalities.  4. Global right ventricle has normal systolic function.The right ventricular size is mildly enlarged.  5. Left atrial size was mildly dilated.  6. Right atrial size was severely dilated.  7. The mitral valve is normal in structure. Mild mitral valve regurgitation. No evidence of mitral stenosis.  8.  The tricuspid valve is normal in structure.  9. The aortic valve is tricuspid. Aortic valve regurgitation is not visualized. Mild aortic valve stenosis. 10. The pulmonic valve was normal in structure. Pulmonic valve regurgitation is not visualized. 11. Mildly elevated pulmonary artery systolic pressure. 12. The inferior vena cava is normal in size with greater than 50% respiratory variability, suggesting right atrial pressure of 3 mmHg. 13. Normal LV systolic function; biatrial enlargement; mild RVE; calcified aortic valve with mild AS (mean gradient 11 mmHg; visually appears moderate); mild MR; moderate TR. FINDINGS  Left Ventricle: Left ventricular ejection fraction, by visual estimation, is 50 to 55%. The left ventricle has normal function. The left ventricle has no regional wall motion abnormalities. There is no left ventricular hypertrophy. The left ventricular diastology could not be evaluated due to atrial fibrillation. Left ventricular  diastolic function could not be evaluated. Normal left atrial pressure. Right Ventricle: The right ventricular size is mildly enlarged. Global RV systolic function is has normal systolic function. The tricuspid regurgitant velocity is 2.92 m/s, and with an assumed right atrial pressure of 3 mmHg, the estimated right ventricular systolic pressure is mildly elevated at 37.1 mmHg. Left Atrium: Left atrial size was mildly dilated. Right Atrium: Right atrial size was severely dilated Pericardium: There is no evidence of pericardial effusion. Mitral Valve: The mitral valve is normal in structure. Mild mitral valve regurgitation. No evidence of mitral valve stenosis by observation. Tricuspid Valve: The tricuspid valve is normal in structure. Tricuspid valve regurgitation moderate. Aortic Valve: The aortic valve is tricuspid. Aortic valve regurgitation is not visualized. Mild aortic stenosis is present. Aortic valve mean gradient measures 11.2 mmHg. Aortic valve peak gradient measures  18.9 mmHg. Aortic valve area, by VTI measures 0.89 cm. Pulmonic Valve: The pulmonic valve was normal in structure. Pulmonic valve regurgitation is not visualized. Pulmonic regurgitation is not visualized. Aorta: The aortic root is normal in size and structure. Venous: The inferior vena cava is normal in size with greater than 50% respiratory variability, suggesting right atrial pressure of 3 mmHg. IAS/Shunts: No atrial level shunt detected by color flow Doppler. Additional Comments: Normal LV systolic function; biatrial enlargement; mild RVE; calcified aortic valve with mild AS (mean gradient 11 mmHg; visually appears moderate); mild MR; moderate TR.  LEFT VENTRICLE PLAX 2D LVIDd:         3.40 cm LVIDs:         2.90 cm LV PW:         0.90 cm LV IVS:        0.90 cm LVOT diam:     1.80 cm LV SV:         15 ml LV SV Index:   8.80 LVOT Area:     2.54 cm  RIGHT VENTRICLE RV S prime:     8.05 cm/s TAPSE (M-mode): 1.2 cm LEFT ATRIUM             Index       RIGHT ATRIUM           Index LA diam:        4.70 cm 2.78 cm/m  RA Area:     23.90 cm LA Vol (A2C):   46.5 ml 27.49 ml/m RA Volume:   72.40 ml  42.80 ml/m LA Vol (A4C):   52.8 ml 31.21 ml/m LA Biplane Vol: 49.6 ml 29.32 ml/m  AORTIC VALVE AV Area (Vmax):    0.89 cm AV Area (Vmean):   0.86 cm AV Area (VTI):     0.89 cm AV Vmax:           217.40 cm/s AV Vmean:          157.000 cm/s AV VTI:            0.373 m AV Peak Grad:      18.9 mmHg AV Mean Grad:      11.2 mmHg LVOT Vmax:         76.13 cm/s LVOT Vmean:        53.100 cm/s LVOT VTI:          0.130 m LVOT/AV VTI ratio: 0.35  AORTA Ao Root diam: 2.70 cm TRICUSPID VALVE TR Peak grad:   34.1 mmHg TR Vmax:        292.00 cm/s  SHUNTS Systemic VTI:  0.13 m Systemic Diam:  1.80 cm  Olga Millers MD Electronically signed by Olga Millers MD Signature Date/Time: 07/17/2019/2:34:47 PM    Final (Updated)     Microbiology: Recent Results (from the past 240 hour(s))  Respiratory Panel by RT PCR (Flu A&B, Covid) -      Status: None   Collection Time: 07/16/19  8:27 PM  Result Value Ref Range Status   SARS Coronavirus 2 by RT PCR NEGATIVE NEGATIVE Final    Comment: (NOTE) SARS-CoV-2 target nucleic acids are NOT DETECTED. The SARS-CoV-2 RNA is generally detectable in upper respiratoy specimens during the acute phase of infection. The lowest concentration of SARS-CoV-2 viral copies this assay can detect is 131 copies/mL. A negative result does not preclude SARS-Cov-2 infection and should not be used as the sole basis for treatment or other patient management decisions. A negative result may occur with  improper specimen collection/handling, submission of specimen other than nasopharyngeal swab, presence of viral mutation(s) within the areas targeted by this assay, and inadequate number of viral copies (<131 copies/mL). A negative result must be combined with clinical observations, patient history, and epidemiological information. The expected result is Negative. Fact Sheet for Patients:  https://www.moore.com/ Fact Sheet for Healthcare Providers:  https://www.young.biz/ This test is not yet ap proved or cleared by the [place] FDA and  has been authorized for detection and/or diagnosis of SARS-CoV-2 by FDA under an Emergency Use Authorization (EUA). This EUA will remain  in effect (meaning this test can be used) for the duration of the COVID-19 declaration under Section 564(b)(1) of the Act, 21 U.S.C. section 360bbb-3(b)(1), unless the authorization is terminated or revoked sooner.    Influenza A by PCR NEGATIVE NEGATIVE Final   Influenza B by PCR NEGATIVE NEGATIVE Final    Comment: (NOTE) The Xpert Xpress SARS-CoV-2/FLU/RSV assay is intended as an aid in  the diagnosis of influenza from Nasopharyngeal swab specimens and  should not be used as a sole basis for treatment. Nasal washings and  aspirates are unacceptable for Xpert Xpress SARS-CoV-2/FLU/RSV    testing. Fact Sheet for Patients: https://www.moore.com/ Fact Sheet for Healthcare Providers: https://www.young.biz/ This test is not yet approved or cleared by the [place] FDA and  has been authorized for detection and/or diagnosis of SARS-CoV-2 by  FDA under an Emergency Use Authorization (EUA). This EUA will remain  in effect (meaning this test can be used) for the duration of the  Covid-19 declaration under Section 564(b)(1) of the Act, 21  U.S.C. section 360bbb-3(b)(1), unless the authorization is  terminated or revoked. Performed at [facility], [address], [place], [place] [address]   SARS CORONAVIRUS 2 (TAT 6-24 HRS) Nasopharyngeal Nasopharyngeal Swab     Status: None   Collection Time: 07/23/19 11:53 AM   Specimen: Nasopharyngeal Swab  Result Value Ref Range Status   SARS Coronavirus 2 NEGATIVE NEGATIVE Final    Comment: (NOTE) SARS-CoV-2 target nucleic acids are NOT DETECTED. The SARS-CoV-2 RNA is generally detectable in upper and lower respiratory specimens during the acute phase of infection. Negative results do not preclude SARS-CoV-2 infection, do not rule out co-infections with other pathogens, and should not be used as the sole basis for treatment or other patient management decisions. Negative results must be combined with clinical observations, patient history, and epidemiological information. The expected result is Negative. Fact Sheet for Patients: HairSlick.no Fact Sheet for Healthcare Providers: quierodirigir.com This test is not yet approved or cleared by the [place] FDA and  has been authorized for  detection and/or diagnosis of SARS-CoV-2 by FDA under an Emergency Use Authorization (EUA). This EUA will remain  in effect (meaning this test can be used) for the duration of the COVID-19 declaration under Section 56 4(b)(1) of the Act, 21  U.S.C. section 360bbb-3(b)(1), unless the authorization is terminated or revoked sooner. Performed at [facility], [address], [place], [place] [address]      Labs: Basic Metabolic Panel: Recent Labs  Lab 07/18/19 0119 07/18/19 0119 07/19/19 0316 07/19/19 0316 07/20/19 0424 07/21/19 0754 07/22/19 0330 07/23/19 0252 07/24/19 0421  NA 136   < > 138   < > 139 137 137 137 137  K 3.8   < > 4.7   < > 4.3 4.1 3.5 4.1 3.5  CL 97*   < > 100   < > 101 99 97* 96* 92*  CO2 30   < > 33*   < > 31 28 30  32 32  GLUCOSE 112*   < > 103*   < > 134* 120* 106* 104* 107*  BUN 23   < > 24*   < > 26* 32* 26* 23 23  CREATININE 0.86   < > 0.87   < > 0.88 0.98 0.97 0.97 0.92  CALCIUM 9.1   < > 9.1   < > 9.3 9.1 8.7* 9.1 9.5  MG 2.1  --  2.2  --  2.2  --   --   --   --   PHOS 3.6  --  3.3  --  3.5  --   --   --   --    < > = values in this interval not displayed.   Liver Function Tests: Recent Labs  Lab 07/18/19 0119 07/19/19 0316 07/20/19 0424  ALBUMIN 3.2* 3.1* 3.0*   No results for input(s): LIPASE, AMYLASE in the last 168 hours. No results for input(s): AMMONIA in the last 168 hours. CBC: Recent Labs  Lab 07/18/19 0119 07/18/19 0119 07/19/19 0316 07/19/19 0316 07/20/19 0424 07/21/19 0754 07/22/19 0330 07/23/19 0252 07/24/19 0421  WBC 9.1   < > 8.9   < > 11.0* 11.8* 9.8 9.3 9.7  NEUTROABS 7.3  --  7.3  --  9.3*  --   --   --   --   HGB 12.9   < > 13.0   < > 12.7 13.2 11.9* 12.4 14.5  HCT 39.1   < > 39.3   < > 39.5 40.6 35.4* 38.0 44.1  MCV 92.4   < > 93.3   < > 95.0 94.4 92.2 94.1 93.0  PLT 268   < > 214   < > 269 285 243 282 319   < > = values in this interval not displayed.   Cardiac Enzymes: No results for input(s): CKTOTAL, CKMB, CKMBINDEX, TROPONINI in the last 168 hours. BNP: BNP (last 3 results) No results for input(s): BNP in the last 8760 hours.  ProBNP (last 3 results) No results for input(s): PROBNP in the last 8760 hours.  CBG: No results for  input(s): GLUCAP in the last 168 hours.     Signed:  07/26/19, MD [facility] 07/24/2019, 12:44 PM

## 2019-07-24 NOTE — Plan of Care (Signed)
°  Problem: Education: °Goal: Knowledge of General Education information will improve °Description: Including pain rating scale, medication(s)/side effects and non-pharmacologic comfort measures °Outcome: Adequate for Discharge °  °Problem: Clinical Measurements: °Goal: Ability to maintain clinical measurements within normal limits will improve °Outcome: Adequate for Discharge °Goal: Will remain free from infection °Outcome: Adequate for Discharge °Goal: Diagnostic test results will improve °Outcome: Adequate for Discharge °Goal: Respiratory complications will improve °Outcome: Adequate for Discharge °Goal: Cardiovascular complication will be avoided °Outcome: Adequate for Discharge °  °

## 2019-07-25 LAB — BASIC METABOLIC PANEL
Anion gap: 13 (ref 5–15)
BUN: 23 mg/dL (ref 8–23)
CO2: 31 mmol/L (ref 22–32)
Calcium: 9 mg/dL (ref 8.9–10.3)
Chloride: 93 mmol/L — ABNORMAL LOW (ref 98–111)
Creatinine, Ser: 0.89 mg/dL (ref 0.44–1.00)
GFR calc Af Amer: >60 mL/min (ref >60)
GFR calc non Af Amer: 55 mL/min — ABNORMAL LOW (ref >60)
Glucose, Bld: 106 mg/dL — ABNORMAL HIGH (ref 70–99)
Potassium: 3.8 mmol/L (ref 3.5–5.1)
Sodium: 137 mmol/L (ref 135–145)

## 2019-07-25 LAB — CBC
HCT: 38.9 % (ref 36.0–46.0)
Hemoglobin: 12.7 g/dL (ref 12.0–15.0)
MCH: 30.7 pg (ref 26.0–34.0)
MCHC: 32.6 g/dL (ref 30.0–36.0)
MCV: 94 fL (ref 80.0–100.0)
Platelets: 283 10*3/uL (ref 150–400)
RBC: 4.14 MIL/uL (ref 3.87–5.11)
RDW: 15.4 % (ref 11.5–15.5)
WBC: 9.1 10*3/uL (ref 4.0–10.5)
nRBC: 0 % (ref 0.0–0.2)

## 2019-07-25 LAB — PROTIME-INR
INR: 1.9 — ABNORMAL HIGH (ref 0.8–1.2)
Prothrombin Time: 21.4 seconds — ABNORMAL HIGH (ref 11.4–15.2)

## 2019-08-19 ENCOUNTER — Encounter: Payer: Self-pay | Admitting: Medical

## 2019-08-19 ENCOUNTER — Ambulatory Visit (INDEPENDENT_AMBULATORY_CARE_PROVIDER_SITE_OTHER): Payer: Medicare Other | Admitting: Medical

## 2019-08-19 ENCOUNTER — Other Ambulatory Visit: Payer: Self-pay

## 2019-08-19 VITALS — BP 122/80 | HR 80 | Temp 97.0°F | Ht 63.0 in | Wt 140.0 lb

## 2019-08-19 DIAGNOSIS — I5033 Acute on chronic diastolic (congestive) heart failure: Secondary | ICD-10-CM

## 2019-08-19 DIAGNOSIS — I10 Essential (primary) hypertension: Secondary | ICD-10-CM

## 2019-08-19 DIAGNOSIS — Z7901 Long term (current) use of anticoagulants: Secondary | ICD-10-CM

## 2019-08-19 DIAGNOSIS — I4821 Permanent atrial fibrillation: Secondary | ICD-10-CM

## 2019-08-19 MED ORDER — POTASSIUM CHLORIDE CRYS ER 20 MEQ PO TBCR: 20.0000 meq | EXTENDED_RELEASE_TABLET | Freq: Every day | ORAL | 3 refills | Status: DC

## 2019-08-19 NOTE — Progress Notes (Signed)
Cardiology Office Note   Date:  08/19/2019   ID:  Kendra Hughes, DOB 04/01/1923, MRN [id number]  PCP:  Carmin Richmond, MD  Cardiologist:  Chrystie Nose, MD EP: None  Chief Complaint  Patient presents with  . Hospitalization Follow-up    atrial fibrillation      History of Present Illness: Kendra Hughes is a 86 y.o. female with a PMH of chronic diastolic CHF, permanent atrial fibrillation, HTN, RA,  who presents for hospital follow-up of her atrial fibrillation.  She was last evaluated by cardiology during a recent admission to the hospital from 07/16/19-07/24/19 for atrial fibrillation with RVR in the setting of a UTI. She was seen by EP during her hospitalization to assist with rate control and was ultimately discharged home on diltiazem 240mg  daily and metoprolol tartrate 25mg  TID. Echo that admission showed EF 50-55%, indeterminate LV diastolic function, mild LAE, severe RAE, mild MR, mild AS, and mildly elevated PA pressures.   She presents today with her son for post-hospital follow-up. She reports she has had low energy since discharge. She notices DOE which has been chronic, though perhaps worse in the past couple weeks. More recently she has noticed swelling in her hands and ankles despite compliance with her lasix 40mg  daily. She typically cooks her own meals and is very cautions with salt, however since discharge from the hospital she has been receiving meals from family/friends more often. Suspect this has resulted in higher salt intake than usual. No complaints of chest pain, dizziness, lightheadedness, orthopnea, PND, or syncope.   Past Medical History:  Diagnosis Date  . Arthritis    "right knee" (11/10/2015)  . Chronic diastolic (congestive) heart failure (HCC)   . DOE (dyspnea on exertion)   . DVT (deep venous thrombosis) (HCC) "years ago"  . Dyslipidemia   . Fracture of left hip (HCC) 11/09/2015   "fell"  . History of nuclear stress test 10/2009   normal exam  .  Hypertension   . PAF (paroxysmal atrial fibrillation) (HCC)   . Rheumatoid arthritis (HCC) 1970   "it's in remission" (11/10/2015)    Past Surgical History:  Procedure Laterality Date  . ABDOMINAL HYSTERECTOMY  1996  . BLADDER SUSPENSION  X 2  . BREAST BIOPSY Right 1984   "benign"  . CARDIOVERSION N/A 11/01/2013   Procedure: CARDIOVERSION;  Surgeon: 11/2009, MD;  Location: [facility] ENDOSCOPY;  Service: Cardiovascular;  Laterality: N/A;  . CATARACT EXTRACTION W/ INTRAOCULAR LENS  IMPLANT, BILATERAL Bilateral 2003  . RECTOCELE REPAIR    . TEE WITHOUT CARDIOVERSION N/A 11/01/2013   Procedure: TRANSESOPHAGEAL ECHOCARDIOGRAM (TEE);  Surgeon: [facility], MD;  Location: [facility] ENDOSCOPY;  Service: Cardiovascular;  Laterality: N/A;  . TONSILLECTOMY    . TOTAL HIP ARTHROPLASTY Left 11/11/2015   Procedure:  HEMI ARTHROPLASTY ANTERIOR APPROACH LEFT;  Surgeon: Chrystie Nose, MD;  Location: [facility] OR;  Service: Orthopedics;  Laterality: Left;  . TRANSTHORACIC ECHOCARDIOGRAM  07/22/2012   EF 55-60%; mild AV regurg; mild MR; mild-mod TR     Current Outpatient Medications  Medication Sig Dispense Refill  . diltiazem (TIAZAC) 240 MG 24 hr capsule Take by mouth.    . metoprolol tartrate (LOPRESSOR) 25 MG tablet Take by mouth.    . warfarin (COUMADIN) 1 MG tablet 1/2 of a 1 mg tablet added to a 2 mg tablet to equal 2.5 mg daily    . acetaminophen (TYLENOL) 325 MG tablet Take 2 tablets (650 mg total)  by mouth every 6 (six) hours as needed for mild pain (or Fever >/= 101). 60 tablet 0  . diltiazem (CARDIZEM CD) 240 MG 24 hr capsule Take 1 capsule (240 mg total) by mouth daily. 30 capsule 0  . furosemide (LASIX) 40 MG tablet TAKE 1 TABLET EVERY DAY  (KEEP APPOINTMENT FOR REFILLS) (Patient taking differently: Take 40 mg by mouth daily. ) 90 tablet 1  . Polyethyl Glycol-Propyl Glycol (SYSTANE OP) Place 1 drop into both eyes daily as needed (dry eyes).    . potassium chloride SA (KLOR-CON) 20 MEQ tablet  Take 1 tablet (20 mEq total) by mouth daily. 30 tablet 3  . traMADol (ULTRAM) 50 MG tablet Take 2 tablets (100 mg total) by mouth every 6 (six) hours as needed for moderate pain or severe pain. 30 tablet 0  . Vitamin D, Ergocalciferol, (DRISDOL) 1.25 MG (50000 UT) CAPS capsule Take 50,000 Units by mouth every Monday.    . warfarin (COUMADIN) 2 MG tablet      No current facility-administered medications for this visit.    Allergies:   Amiodarone    Social History:  The patient  reports that she has never smoked. She has never used smokeless tobacco. She reports that she does not drink alcohol or use drugs.   Family History:  The patient's family history includes Heart disease in her mother.    ROS:  Please see the history of present illness.   Otherwise, review of systems are positive for none.   All other systems are reviewed and negative.    PHYSICAL EXAM: VS:  BP 122/80   Pulse 80   Temp (!) 97 F (36.1 C)   Ht 5\' 3"  (1.6 m)   Wt 140 lb (63.5 kg)   SpO2 99%   BMI 24.80 kg/m  , BMI Body mass index is 24.8 kg/m. GEN: Well nourished, well developed, in no acute distress HEENT: sclera anicteric Neck: no JVD, carotid bruits, or masses Cardiac: IRIR; no murmurs, rubs, or gallops, 1-2+ LE edema  Respiratory:  clear to auscultation bilaterally, normal work of breathing GI: soft, nontender, nondistended, + BS MS: no deformity or atrophy Skin: warm and dry, no rash Neuro:  Strength and sensation are intact Psych: euthymic mood, full affect   EKG:  EKG is not ordered today.   Recent Labs: 07/17/2019: ALT 19; TSH 1.578 07/20/2019: Magnesium 2.2 07/25/2019: BUN 23; Creatinine, Ser 0.89; Hemoglobin 12.7; Platelets 283; Potassium 3.8; Sodium 137    Lipid Panel No results found for: CHOL, TRIG, HDL, CHOLHDL, VLDL, LDLCALC, LDLDIRECT    Wt Readings from Last 3 Encounters:  08/19/19 140 lb (63.5 kg)  07/24/19 144 lb 4.8 oz (65.5 kg)  05/21/19 156 lb (70.8 kg)      Other  studies Reviewed: Additional studies/ records that were reviewed today include:   Echocardiogram 07/17/19: 1. Left ventricular ejection fraction, by visual estimation, is 50 to  55%. The left ventricle has normal function. There is no left ventricular  hypertrophy.  2. Left ventricular diastolic function could not be evaluated.  3. The left ventricle has no regional wall motion abnormalities.  4. Global right ventricle has normal systolic function.The right  ventricular size is mildly enlarged.  5. Left atrial size was mildly dilated.  6. Right atrial size was severely dilated.  7. The mitral valve is normal in structure. Mild mitral valve  regurgitation. No evidence of mitral stenosis.  8. The tricuspid valve is normal in structure.  9. The  aortic valve is tricuspid. Aortic valve regurgitation is not  visualized. Mild aortic valve stenosis.  10. The pulmonic valve was normal in structure. Pulmonic valve  regurgitation is not visualized.  11. Mildly elevated pulmonary artery systolic pressure.  12. The inferior vena cava is normal in size with greater than 50%  respiratory variability, suggesting right atrial pressure of 3 mmHg.  13. Normal LV systolic function; biatrial enlargement; mild RVE; calcified  aortic valve with mild AS (mean gradient 11 mmHg; visually appears  moderate); mild MR; moderate TR. .    ASSESSMENT AND PLAN:  1. Acute on chronic diastolic CHF: worsening DOE and LE edema - 1-2+ on exam today. Lungs are clear.  - Will increase lasix to 80mg  daily x4 days, then resume 40mg  daily dosing. Will send Rx to take K 20 mEq when taking extra lasix.  - We discussed using additional 40mg  of lasix as needed for SOB and LE edema going forward.  - Encouraged a low sodium diet, education on salty six, and daily weights.   2. Permanent atrial fibrillation: rates seem to be well controlled on diltiazem 240mg  daily and metoprolol tartrate 25mg  TID (offered BID dosing,  however she did not want to cut pills in half).  - Continue diltiazem and metoprolol for rate control - Continue coumadin for stroke ppx - managed by PCP  3. HTN: BP 122/80 today - Continue diltiazem, metoprolol, and lasix     Current medicines are reviewed at length with the patient today.  The patient does not have concerns regarding medicines.  The following changes have been made:  As above  Labs/ tests ordered today include:  No orders of the defined types were placed in this encounter.    Disposition:   FU with me via a telemedicine visit in 1 month  Signed, Abigail Butts, PA-C  08/19/2019 6:03 PM

## 2019-08-19 NOTE — Patient Instructions (Signed)
Medication Instructions:   INCREASE Lasix to 80 mg daily for 4 days, then on day 5 resume 40 mg daily. You may also take an extra dose of Lasix for swelling and/or shortness of breath   Take Potassium 20 meq daily for 4 days then take as needed on days you take an extra Lasix  *If you need a refill on your cardiac medications before your next appointment, please call your pharmacy*  Lab Work: NONE ordered at this time of appointment   If you have labs (blood work) drawn today and your tests are completely normal, you will receive your results only by: [name] MyChart Message (if you have MyChart) OR . A paper copy in the mail If you have any lab test that is abnormal or we need to change your treatment, we will call you to review the results.  Testing/Procedures: NONE ordered at this time of appointment   Follow-Up: At [facility], you and your health needs are our priority.  As part of our continuing mission to provide you with exceptional heart care, we have created designated Provider Care Teams.  These Care Teams include your primary Cardiologist (physician) and Advanced Practice Providers (APPs -  Physician Assistants and Nurse Practitioners) who all work together to provide you with the care you need, when you need it.  Your next appointment:   1 month(s)  The format for your next appointment:   Virtual Visit   Provider:   Judy Pimple, PA-C  Other Instructions

## 2019-09-21 NOTE — Progress Notes (Signed)
Virtual Visit via Telephone Note   This visit type was conducted due to national recommendations for restrictions regarding the COVID-19 Pandemic (e.g. social distancing) in an effort to limit this patient's exposure and mitigate transmission in our community.  Due to her co-morbid illnesses, this patient is at least at moderate risk for complications without adequate follow up.  This format is felt to be most appropriate for this patient at this time.  The patient did not have access to video technology/had technical difficulties with video requiring transitioning to audio format only (telephone).  All issues noted in this document were discussed and addressed.  No physical exam could be performed with this format.  Please refer to the patient's chart for her  consent to telehealth for [facility].   The patient was identified using 2 identifiers.  Date:  09/21/2019   ID:  Kendra Hughes, DOB 1922-09-18, MRN [id number]  Patient Location: Home Provider Location: Office  PCP:  Carmin Richmond, MD  Cardiologist:  Chrystie Nose, MD  Electrophysiologist:  None   Evaluation Performed:  Follow-Up Visit  Chief Complaint:  Follow-up CHF  History of Present Illness:    Kendra Hughes is a 86 y.o. female with of chronic diastolic CHF, permanent atrial fibrillation, HTN, RA, who presents for 1 month follow-up.  She was last evaluated by cardiology at an outpatient visit with myself 08/19/19 for post-hospital follow-up after a recent admission to [facility] in January 2021 for UTI c/b atrial fibrillation with RVR. Echo that admission showed EF 50-55%, indeterminate LV diastolic function, mild LAE, severe RAE, mild MR, mild AS, and mildly elevated PA pressures. During her visit 08/2019 she noted increased swelling in her hands and ankles, felt to be 2/2 increased salt intake as friends/family were preparing meals for her after her hospitalization. She was recommended to increased her lasix to 80mg  daily  x4 days, then resume 40mg  daily dosing.   She presents today for close follow-up of her CHF. She reported initial improvement in her swelling after increased lasix x4 days following her last visit. Unfortunately she has continued to have intermittent swelling since then. Now with SOB and nocturnal coughing/orthopnea. She reports easy fatigability. No complaints of chest pain, dizziness, lightheadedness, or syncope. She has not appreciated any racing heart beats and reports HR's primarily in the 70s on daily checks. Her blood pressure ranges from 90s-110s/70s.   The patient does not have symptoms concerning for COVID-19 infection (fever, chills, cough, or new shortness of breath).    Past Medical History:  Diagnosis Date  . Arthritis    "right knee" (11/10/2015)  . Chronic diastolic (congestive) heart failure (HCC)   . DOE (dyspnea on exertion)   . DVT (deep venous thrombosis) (HCC) "years ago"  . Dyslipidemia   . Fracture of left hip (HCC) 11/09/2015   "fell"  . History of nuclear stress test 10/2009   normal exam  . Hypertension   . PAF (paroxysmal atrial fibrillation) (HCC)   . Rheumatoid arthritis (HCC) 1970   "it's in remission" (11/10/2015)   Past Surgical History:  Procedure Laterality Date  . ABDOMINAL HYSTERECTOMY  1996  . BLADDER SUSPENSION  X 2  . BREAST BIOPSY Right 1984   "benign"  . CARDIOVERSION N/A 11/01/2013   Procedure: CARDIOVERSION;  Surgeon: 01/10/2016, MD;  Location: [facility] ENDOSCOPY;  Service: Cardiovascular;  Laterality: N/A;  . CATARACT EXTRACTION W/ INTRAOCULAR LENS  IMPLANT, BILATERAL Bilateral 2003  . RECTOCELE REPAIR    .  TEE WITHOUT CARDIOVERSION N/A 11/01/2013   Procedure: TRANSESOPHAGEAL ECHOCARDIOGRAM (TEE);  Surgeon: Pixie Casino, MD;  Location: [facility] ENDOSCOPY;  Service: Cardiovascular;  Laterality: N/A;  . TONSILLECTOMY    . TOTAL HIP ARTHROPLASTY Left 11/11/2015   Procedure:  HEMI ARTHROPLASTY ANTERIOR APPROACH LEFT;  Surgeon: Mcarthur Rossetti,  MD;  Location: [place];  Service: Orthopedics;  Laterality: Left;  . TRANSTHORACIC ECHOCARDIOGRAM  07/22/2012   EF 55-60%; mild AV regurg; mild MR; mild-mod TR     No outpatient medications have been marked as taking for the 09/22/19 encounter (Appointment) with Abigail Butts., PA-C.     Allergies:   Amiodarone   Social History   Tobacco Use  . Smoking status: Never Smoker  . Smokeless tobacco: Never Used  Substance Use Topics  . Alcohol use: No  . Drug use: No     Family Hx: The patient's family history includes Heart disease in her mother.  ROS:   Please see the history of present illness.     All other systems reviewed and are negative.   Prior CV studies:   The following studies were reviewed today:  Echocardiogram 07/17/19: 1. Left ventricular ejection fraction, by visual estimation, is 50 to  55%. The left ventricle has normal function. There is no left ventricular  hypertrophy.  2. Left ventricular diastolic function could not be evaluated.  3. The left ventricle has no regional wall motion abnormalities.  4. Global right ventricle has normal systolic function.The right  ventricular size is mildly enlarged.  5. Left atrial size was mildly dilated.  6. Right atrial size was severely dilated.  7. The mitral valve is normal in structure. Mild mitral valve  regurgitation. No evidence of mitral stenosis.  8. The tricuspid valve is normal in structure.  9. The aortic valve is tricuspid. Aortic valve regurgitation is not  visualized. Mild aortic valve stenosis.  10. The pulmonic valve was normal in structure. Pulmonic valve  regurgitation is not visualized.  11. Mildly elevated pulmonary artery systolic pressure.  12. The inferior vena cava is normal in size with greater than 50%  respiratory variability, suggesting right atrial pressure of 3 mmHg.  13. Normal LV systolic function; biatrial enlargement; mild RVE; calcified  aortic valve with mild AS (mean  gradient 11 mmHg; visually appears  moderate); mild MR; moderate TR. .  Labs/Other Tests and Data Reviewed:    EKG:  No ECG reviewed.  Recent Labs: 07/17/2019: ALT 19; TSH 1.578 07/20/2019: Magnesium 2.2 07/25/2019: BUN 23; Creatinine, Ser 0.89; Hemoglobin 12.7; Platelets 283; Potassium 3.8; Sodium 137   Recent Lipid Panel No results found for: CHOL, TRIG, HDL, CHOLHDL, LDLCALC, LDLDIRECT  Wt Readings from Last 3 Encounters:  08/19/19 140 lb (63.5 kg)  07/24/19 144 lb 4.8 oz (65.5 kg)  05/21/19 156 lb (70.8 kg)     Objective:    Vital Signs:  There were no vitals taken for this visit.    VITAL SIGNS:  reviewed GEN:  no acute distress RESPIRATORY:  she sounds mildly SOB on the phone CARDIOVASCULAR:  lower extremity edema noted NEURO:  A&O x3 PSYCH:  normal affect   ASSESSMENT & PLAN:    1. Acute on chronic diastolic CHF: symptoms initially improved with increased lasix, however has returned; now with SOB, LE edema, and orthopnea/nocturnal coughing - Will repeat short course of lasix 80mg  daily x 3 days with plans to continue 60mg  daily going forward in an effort to keep fluid at [place]. She will  start taking potassium 20 mEq daily going forward. - Continue metoprolol - She is having blood work with her PCP next Tuesday - asked that she send the BMET to our office to review.  - Encouraged low sodium diet and daily weights.  - Patient to call the office if symptoms are not improved.   2. Permanent atrial fibrillation: rates seem to be well controlled on diltiazem 240mg  daily and metoprolol tartrate 25mg  TID (offered BID dosing, however she did not want to cut pills in half).  - Continue diltiazem and metoprolol for rate control - Continue coumadin for stroke ppx - managed by PCP  3. HTN: BP 96/72 today - Continue diltiazem, metoprolol, and lasix   COVID-19 Education: The signs and symptoms of COVID-19 were discussed with the patient and how to seek care for testing (follow up  with PCP or arrange E-visit).  The importance of social distancing was discussed today.  Time:   Today, I have spent 12 minutes with the patient with telehealth technology discussing the above problems.     Medication Adjustments/Labs and Tests Ordered: Current medicines are reviewed at length with the patient today.  Concerns regarding medicines are outlined above.   Tests Ordered: No orders of the defined types were placed in this encounter.   Medication Changes: No orders of the defined types were placed in this encounter.   Follow Up:  With Dr. in May as previously scheduled.    Signed, Rennis Golden, PA-C  09/21/2019 11:22 PM    [facility]

## 2019-09-22 ENCOUNTER — Telehealth (INDEPENDENT_AMBULATORY_CARE_PROVIDER_SITE_OTHER): Payer: Medicare Other | Admitting: Medical

## 2019-09-22 ENCOUNTER — Encounter: Payer: Self-pay | Admitting: Medical

## 2019-09-22 VITALS — BP 96/72 | HR 73 | Ht 63.0 in | Wt 146.0 lb

## 2019-09-22 DIAGNOSIS — I10 Essential (primary) hypertension: Secondary | ICD-10-CM

## 2019-09-22 DIAGNOSIS — I5033 Acute on chronic diastolic (congestive) heart failure: Secondary | ICD-10-CM | POA: Diagnosis not present

## 2019-09-22 DIAGNOSIS — Z7901 Long term (current) use of anticoagulants: Secondary | ICD-10-CM | POA: Diagnosis not present

## 2019-09-22 DIAGNOSIS — I4821 Permanent atrial fibrillation: Secondary | ICD-10-CM | POA: Diagnosis not present

## 2019-09-22 MED ORDER — POTASSIUM CHLORIDE CRYS ER 20 MEQ PO TBCR: 20.0000 meq | EXTENDED_RELEASE_TABLET | Freq: Every day | ORAL | 3 refills | Status: DC

## 2019-09-22 MED ORDER — FUROSEMIDE 40 MG PO TABS: ORAL_TABLET | ORAL | 1 refills | Status: DC

## 2019-09-22 NOTE — Patient Instructions (Signed)
Medication Instructions:  Your physician has recommended you make the following change in your medication:  1.  START Potassium 20 meq taking 1 daily 2.  INCREASE the Lasix to 40 mg taking 2 tablets daily for 3 days then go back down to 1 1/2 tablet daily   *If you need a refill on your cardiac medications before your next appointment, please call your pharmacy*   Lab Work: 1 week:  BMET at your primary care office.  Please have them fax a copy to [place]  If you have labs (blood work) drawn today and your tests are completely normal, you will receive your results only by: [name] MyChart Message (if you have MyChart) OR . A paper copy in the mail If you have any lab test that is abnormal or we need to change your treatment, we will call you to review the results.   Testing/Procedures: None ordered   Follow-Up: At [facility], you and your health needs are our priority.  As part of our continuing mission to provide you with exceptional heart care, we have created designated Provider Care Teams.  These Care Teams include your primary Cardiologist (physician) and Advanced Practice Providers (APPs -  Physician Assistants and Nurse Practitioners) who all work together to provide you with the care you need, when you need it.  We recommend signing up for the patient portal called "MyChart".  Sign up information is provided on this After Visit Summary.  MyChart is used to connect with patients for Virtual Visits (Telemedicine).  Patients are able to view lab/test results, encounter notes, upcoming appointments, etc.  Non-urgent messages can be sent to your provider as well.   To learn more about what you can do with MyChart, go to [email or web address].    Your next appointment:   Keep your scheduled follow-up with Dr. Rennis Golden

## 2019-11-19 ENCOUNTER — Other Ambulatory Visit: Payer: Self-pay

## 2019-11-19 ENCOUNTER — Ambulatory Visit (INDEPENDENT_AMBULATORY_CARE_PROVIDER_SITE_OTHER): Payer: Medicare Other | Admitting: Internal Medicine

## 2019-11-19 ENCOUNTER — Encounter: Payer: Self-pay | Admitting: Internal Medicine

## 2019-11-19 VITALS — BP 123/65 | HR 76 | Temp 97.1°F | Ht 65.0 in | Wt 148.8 lb

## 2019-11-19 DIAGNOSIS — I10 Essential (primary) hypertension: Secondary | ICD-10-CM | POA: Diagnosis not present

## 2019-11-19 DIAGNOSIS — I4819 Other persistent atrial fibrillation: Secondary | ICD-10-CM | POA: Diagnosis not present

## 2019-11-19 DIAGNOSIS — Z7901 Long term (current) use of anticoagulants: Secondary | ICD-10-CM | POA: Diagnosis not present

## 2019-11-19 DIAGNOSIS — I5033 Acute on chronic diastolic (congestive) heart failure: Secondary | ICD-10-CM

## 2019-11-19 MED ORDER — FUROSEMIDE 40 MG PO TABS: 80.0000 mg | ORAL_TABLET | Freq: Every day | ORAL | 1 refills | Status: DC

## 2019-11-19 MED ORDER — DILTIAZEM HCL ER COATED BEADS 120 MG PO CP24: 120.0000 mg | ORAL_CAPSULE | Freq: Every day | ORAL | 3 refills | Status: DC

## 2019-11-19 NOTE — Patient Instructions (Signed)
Medication Instructions:  DECREASE diltiazem to 120mg  daily INCREASE lasix to 80mg  daily  *If you need a refill on your cardiac medications before your next appointment, please call your pharmacy*   Follow-Up: At [facility], you and your health needs are our priority.  As part of our continuing mission to provide you with exceptional heart care, we have created designated Provider Care Teams.  These Care Teams include your primary Cardiologist (physician) and Advanced Practice Providers (APPs -  Physician Assistants and Nurse Practitioners) who all work together to provide you with the care you need, when you need it.  We recommend signing up for the patient portal called "MyChart".  Sign up information is provided on this After Visit Summary.  MyChart is used to connect with patients for Virtual Visits (Telemedicine).  Patients are able to view lab/test results, encounter notes, upcoming appointments, etc.  Non-urgent messages can be sent to your provider as well.   To learn more about what you can do with MyChart, go to .    Your next appointment:   1 month(s)  The format for your next appointment:   In Person  Provider:   You may see [facility], MD or one of the following Advanced Practice Providers on your designated Care Team:    [email or web address], PA-C  Chrystie Nose, PA-C or   Azalee Course, Micah Flesher    Other Instructions

## 2019-11-19 NOTE — Progress Notes (Signed)
OFFICE NOTE  Chief Complaint:  Follow-up, dyspnea  Primary Care Physician: Carmin Richmond, MD  HPI:  Kendra Hughes is an 86 year old female who I've been following for some time with a history of paroxysmal atrial fibrillation. Despite her CHADS2 score of 2, she has refused warfarin anticoagulation in the past, opting for aspirin which are documented extensively he is insufficient to protect her from stroke. She had worsening shortness of breath and apparently she and her husband tried to call the office to schedule an appointment with me but could not get an appointment.  When I saw her in the office a few months ago, she was notably breathless and can hardly finish sentences. An EKG demonstrated atrial fibrillation with rapid ventricular response at a rate of 173. She did appear somewhat dizzy and her blood pressure was low around 100 systolic.  I admitted her to the hospital and she was an inpatient for 1 week.  She was diuresed and had better rate control of her A. fib. She did undergo a TEE, which demonstrated a left atrial appendage thrombus. Based on that she could not be cardioverted and is recommended that she remain on warfarin with therapeutic INRs for at least one month.  Discharge weight was 168 and she is 166 pounds today.  She does report some mild edema which is dependent in the right leg but none in the left but does improve with elevation at night.  After her last office visit, recommended repeat TEE which demonstrated resolution of her left atrial appendage thrombus. She then underwent elective cardioversion which was successful in converting her back to sinus rhythm. After she returned today I asked her how she was feeling and she reports that she feels "great". I asked her she was feeling any palpitations or irregularity to her heart beat and she denied it. I then presented her with her EKG which demonstrates that she has gone back into atrial fibrillation, suggesting that she  is not symptomatic from A. fib and generally unaware of it. She does report that she takes her blood pressure and pulse every day and noted that after about 5 days of her heart rate being in the mid 50s it increased up into the 70s and 80s, signifying that is probably when she went back into A. Fib.  She's had marked improvement in her swelling and is now down 10 pounds. She's been wearing her lower extremity compression stockings with marked improvement. She had recent laboratory work from her primary care provider which showed an elevated triglyceride level of 253, total cholesterol 154, HDL 36 and LDL 67. It should be noted that these were nonfasting labs. There was a increase in her Lipitor to 20 mg daily.  I saw Kendra Hughes back in the office today. She reports a big improvement in her shortness of breath however she still has persistent shortness of breath with exertion. Her weight is down now over 15 pounds with diuresis and she remains euvolemic. There is no evidence of any peripheral edema. She does report of breathing is better than it had been in the past but she still says she gets short of breath with walking long distances.   Kendra Hughes seems to be doing very well. In the office today she reports stable shortness of breath. She is asking to come off some medicines if possible. She remains in A. fib with a slow ventricular response. Her INR has been therapeutic and followed by her primary. She recently  had lab work including a lipid profile. Her triglycerides were elevated in the 300s however this was nonfasting.  Kendra Hughes returns today for follow-up. She denies any worsening chest pain or worsening shortness of breath. Her weight is been fairly stable. Her swelling is stable. She is therapeutic with her INR. Blood pressure is at goal. She has persistent A. fib and his rate controlled at 97. She recently had lab work which shows excellent cholesterol control with total cholesterol 124, triglycerides 84,  HDL 50 and LDL 57. Renal function is normal with creatinine of 0.8. CBC is also normal and vitamin D was normal at 49 on replacement therapy. ALT is mildly elevated at 50.  12/06/2015  Kendra Hughes was seen back today in the office in hospital follow-up. Unfortunately she suffered a hip fracture and went into A. fib with RVR. She was placed on amiodarone which helped to slow her rate and she was considered for TEE cardioversion however due to the paroxysmal nature of this she was just treated with amiodarone. Unfortunately amiodarone caused her significant shaking and this was dose reduced with an improvement in her symptoms although she still has some shakiness. She is now however to feed herself which was difficult before due to her shaking. She remains in atrial fibrillation and 95 but is asymptomatic with this. In the past she's been cardioverted but obviously failed to hold sinus rhythm. She is now re-anticoagulated on warfarin. There was a discussion about outpatient cardioversion. A repeat echo was performed and x-ray showed preserved systolic function.  02/05/2016  I had the pleasure seeing Kendra Hughes back today for follow-up. At her last office visit I discontinued her amiodarone completely as she was having tremors and muscle soreness. The symptoms have improved significantly but not totally gone away. I did increase her Lopressor for additional rate control her heart rate seems to be fairly well controlled. She denies any worsening shortness of breath or lower extremity swelling. She reports having a therapeutic INR which is followed by her primary care provider.  04/24/2016  Kendra Hughes was seen back today in follow-up. After adjusting the medications she still has some low blood pressures at times. She does not notice any change in her dizziness. Blood pressure is stable however weight has gone up from 156-162 pounds. She does notany worsening shortness of breath or orthopnea. Heart rate is in the 80s and  occasionally gets over 100 per generally is between 60 and 100 with A. fib. EKG shows A. fib at 83 today. He also reports some persistent muscle weakness. She initially had some tremors and muscle weakness which was thought related to amiodarone, but that has long been discontinued.  09/05/2016  Kendra Hughes returns today for follow-up. She is actually here for preoperative risk assessment. She's planning on a possible lumbar spine surgery she was found to have compression fracture. She's having lumbar radiculopathy related to that. This may been related to her prior femoral neck fracture which was in May of last year. She did undergo surgery without consultation at that time. She has however had pneumonia and is generally fairly frail given her age of 35. I previous he recommended holding her warfarin for 5 days prior to surgery but cannot estimate her risk anything less than intermediate. We discussed the surgery at some more detail today and she seems to have some hesitation as she has noted a mild improvement in her back pain symptoms recently.  12/23/2016  Kendra Hughes returns for follow-up. She has successfully come  off of her narcotics for back pain. She had an L5 compression fracture and had contemplated surgery but ended up not having surgery. She underwent some back injections and reports improvement in her pain. She still has some limited mobility and requires a walker. She inquired today is whether or not she should be on some treatment for osteoporosis. She says she's not had a bone density scan before which would be recommended and certainly given her history of compression fracture and hip fracture she should benefit even at her advanced age from treatment to improve bone density. She appears euvolemic on exam today weight is within 1 pound of her prior weight. She remains in long-standing persistent if not permanent atrial fibrillation with a heart rate of around 100. She is resume chronic warfarin  therapy and is back on her statin medication. Recent LDL-C was 52.  06/23/2017  Kendra Hughes returns today for follow-up.  She notes she has had progressive dyspnea.  She has a history of chronic diastolic congestive heart failure, however weight is been stable and she is compliant on Lasix.  She is also had a DVT and is on warfarin.  She is in persistent A. fib with generally good rate control.  Heart rate was elevated today but that was taken right after walking in the office.  She says even with very minimal exertion she gets short of breath.  It is notable that her last echo in 2017 showed moderate pulmonary hypertension with normal LV function.  Since she has had 2 fractures, I recommended a DEXA scan and to consider bisphosphonate therapy.  This is not yet been performed and she told me that her primary care physician did not believe there would be much benefit for her given her age.  03/27/2018  Kendra Hughes seen today in follow-up.  Overall she seems to be doing well.  Her shortness of breath is been stable.  She denies any worsening back pain and now is on tramadol.  She is in long-standing persistent if not permanent A. fib with good rate control.  She is maintained on warfarin checked by her PCP and is been therapeutic.  She uses low-dose Lasix and has no significant edema.  She is on low-dose atorvastatin with good cholesterol control.  11/19/2019  Kendra Hughes returns for follow-up.  More recently she was hospitalized with heart failure symptoms.  Subsequently she was seen in follow-up by Roby Lofts, PA-C, who saw her in the office and in virtual visit.  She was noted to have some worsening edema and decompensated heart failure.  Her Lasix was increased from 60 to 80 mg for several days and then decrease back to 60 mg.  Weight at last check was 140 pounds however now come up to 148 pounds.  There have been no dietary changes.  She reported some worsening edema particular on her right side.  In addition she  has had low blood pressures at home between 80 and 371 systolic.  Blood pressure in the office today however was normal.  Her primary decreased her beta-blocker from 3 times a day to twice a day.  She also is on diltiazem.  PMHx:  Past Medical History:  Diagnosis Date  . Arthritis    "right knee" (11/10/2015)  . Chronic diastolic (congestive) heart failure ([place])   . DOE (dyspnea on exertion)   . DVT (deep venous thrombosis) ([place]) "years ago"  . Dyslipidemia   . Fracture of left hip ([place]) 11/09/2015   "fell"  .  History of nuclear stress test 10/2009   normal exam  . Hypertension   . PAF (paroxysmal atrial fibrillation) ([facility])   . Rheumatoid arthritis ([facility]) 1970   "it's in remission" (11/10/2015)    Past Surgical History:  Procedure Laterality Date  . ABDOMINAL HYSTERECTOMY  1996  . BLADDER SUSPENSION  X 2  . BREAST BIOPSY Right 1984   "benign"  . CARDIOVERSION N/A 11/01/2013   Procedure: CARDIOVERSION;  Surgeon: Chrystie Nose, MD;  Location[facility] END[facility]COPY;  Service: Cardiovascular;  Laterality: N/A;  . CATARACT EXTRACTION W/ INTRAOCULAR LENS  IMPLANT, BILATERAL Bilateral 2003  . RECTOCELE REPAIR    . TEE WITHOUT CARDIOVERSION N/A 11/01/2013   Procedure: TRANSESOPHAGEAL ECHOCARDIOGRAM (TEE);  Surgeon: Chrystie Nose, MD;  Location: [facility] ENDOSCOPY;  Service: Cardiovascular;  Laterality: N/A;  . TONSILLECTOMY    . TOTAL HIP ARTHROPLASTY Left 11/11/2015   Procedure:  HEMI ARTHROPLASTY ANTERIOR APPROACH LEFT;  Surgeon: Kathryne Hitch, MD;  Location: [facility] OR;  Service: Orthopedics;  Laterality: Left;  . TRANSTHORACIC ECHOCARDIOGRAM  07/22/2012   EF 55-60%; mild AV regurg; mild MR; mild-mod TR    FAMHx:  Family History  Problem Relation Age of Onset  . Heart disease Mother     SOCHx:   reports that she has never smoked. She has never used smokeless tobacco. She reports that she does not drink alcohol or use drugs.  ALLERGIES:  Allergies  Allergen Reactions  . Amiodarone Other  (See Comments)    Tremor, muscle weakness    ROS: Pertinent items noted in HPI and remainder of comprehensive ROS otherwise negative.  HOME MEDS: Current Outpatient Medications  Medication Sig Dispense Refill  . acetaminophen (TYLENOL) 325 MG tablet Take 2 tablets (650 mg total) by mouth every 6 (six) hours as needed for mild pain (or Fever >/= 101). 60 tablet 0  . diltiazem (CARDIZEM CD) 120 MG 24 hr capsule Take 1 capsule (120 mg total) by mouth daily. 90 capsule 3  . furosemide (LASIX) 40 MG tablet Take 2 tablets (80 mg total) by mouth daily. 180 tablet 1  . metoprolol tartrate (LOPRESSOR) 25 MG tablet Take 25 mg by mouth in the morning and at bedtime.    Bertram Gala Glycol-Propyl Glycol (SYSTANE OP) Place 1 drop into both eyes daily as needed (dry eyes).    . potassium chloride SA (KLOR-CON) 20 MEQ tablet Take 1 tablet (20 mEq total) by mouth daily. 90 tablet 3  . warfarin (COUMADIN) 1 MG tablet 1/2 of a 1 mg tablet added to a 2 mg tablet to equal 2.5 mg daily    . warfarin (COUMADIN) 2 MG tablet      No current facility-administered medications for this visit.    LABS/IMAGING: No results found for this or any previous visit (from the past 48 hour(s)). No results found.  VITALS: BP 123/65   Pulse 76   Temp (!) 97.1 F (36.2 C)   Ht 5\' 5"  (1.651 m)   Wt 148 lb 12.8 oz (67.5 kg)   SpO2 99%   BMI 24.76 kg/m   EXAM: General appearance: alert and no distress Neck: JVD - 3 cm above sternal notch and no carotid bruit Lungs: diminished breath sounds bibasilar Heart: irregularly irregular rhythm and S1, S2 normal Abdomen: soft, non-tender; bowel sounds normal; no masses,  no organomegaly Extremities: edema 1+ right lower extremity and varicose veins noted Pulses: 2+ and symmetric Skin: Skin color, texture, turgor normal. No rashes or lesions Neurologic: Grossly normal  Psych: Normal  EKG: A. fib at 66-personally reviewed  ASSESSMENT: 1. Acute on chronic diastolic heart  failure, dyspnea 2. Longstanding persistent atrial fibrillation - with variable ventricular response 3. 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 LVEF 60-65% 5. CHADSVASC 5-on chronic warfarin 6. S/p L hip fracture (2017) 7. S/p L5 compression fracture 8. Presumed osteoporosis  PLAN: 1.   Kendra Hughes has some evidence of acute on chronic diastolic congestive heart failure today.  Is been about 8 pound weight gain since she was last seen and I would advise increasing her Lasix back to 80 mg and remain on this dose.  Recent lab work from her PCP showed normal renal function and her potassium was normal.  Would also decrease her diltiazem from 240 to 120 mg due to hypotension which she has had at home and ongoing fatigue.  She will remain on twice daily metoprolol and I think her rate control will be okay.  She will need follow-up closely in the office in about a month with [place].  Chrystie Nose, MD, [facility], FACP  [place]  [facility]  Medical Director of the [facility]  Cardiovascular Risk Reduction Clinic Attending Cardiologist  Direct Dial: [phone]  Fax: [phone]  Website:  [email or web address]  Lisette Abu Angelito Hopping 11/19/2019, 1:12 PM

## 2019-12-23 ENCOUNTER — Ambulatory Visit: Payer: Medicare Other | Admitting: Physician Assistant

## 2020-01-17 ENCOUNTER — Encounter: Payer: Self-pay | Admitting: Physician Assistant

## 2020-01-17 ENCOUNTER — Telehealth: Payer: Self-pay

## 2020-01-17 ENCOUNTER — Telehealth (INDEPENDENT_AMBULATORY_CARE_PROVIDER_SITE_OTHER): Payer: Medicare Other | Admitting: Physician Assistant

## 2020-01-17 VITALS — BP 95/66 | HR 83 | Ht 65.0 in | Wt 147.5 lb

## 2020-01-17 DIAGNOSIS — E785 Hyperlipidemia, unspecified: Secondary | ICD-10-CM

## 2020-01-17 DIAGNOSIS — I10 Essential (primary) hypertension: Secondary | ICD-10-CM

## 2020-01-17 DIAGNOSIS — I480 Paroxysmal atrial fibrillation: Secondary | ICD-10-CM

## 2020-01-17 NOTE — Telephone Encounter (Signed)
  Patient Consent for Virtual Visit         Kendra Hughes has provided verbal consent on 01/17/2020 for a virtual visit (video or telephone).   CONSENT FOR VIRTUAL VISIT FOR:  Kendra Hughes  By participating in this virtual visit I agree to the following:  I hereby voluntarily request, consent and authorize [facility] and its employed or contracted physicians, physician assistants, nurse practitioners or other licensed health care professionals (the Practitioner), to provide me with telemedicine health care services (the "Services") as deemed necessary by the treating Practitioner. I acknowledge and consent to receive the Services by the Practitioner via telemedicine. I understand that the telemedicine visit will involve communicating with the Practitioner through live audiovisual communication technology and the disclosure of certain medical information by electronic transmission. I acknowledge that I have been given the opportunity to request an in-person assessment or other available alternative prior to the telemedicine visit and am voluntarily participating in the telemedicine visit.  I understand that I have the right to withhold or withdraw my consent to the use of telemedicine in the course of my care at any time, without affecting my right to future care or treatment, and that the Practitioner or I may terminate the telemedicine visit at any time. I understand that I have the right to inspect all information obtained and/or recorded in the course of the telemedicine visit and may receive copies of available information for a reasonable fee.  I understand that some of the potential risks of receiving the Services via telemedicine include:  [name] Delay or interruption in medical evaluation due to technological equipment failure or disruption; . Information transmitted may not be sufficient (e.g. poor resolution of images) to allow for appropriate medical decision making by the Practitioner;  and/or  . In rare instances, security protocols could fail, causing a breach of personal health information.  Furthermore, I acknowledge that it is my responsibility to provide information about my medical history, conditions and care that is complete and accurate to the best of my ability. I acknowledge that Practitioner's advice, recommendations, and/or decision may be based on factors not within their control, such as incomplete or inaccurate data provided by me or distortions of diagnostic images or specimens that may result from electronic transmissions. I understand that the practice of medicine is not an exact science and that Practitioner makes no warranties or guarantees regarding treatment outcomes. I acknowledge that a copy of this consent can be made available to me via my patient portal [facility] MyChart), or I can request a printed copy by calling the office of [facility].    I understand that my insurance will be billed for this visit.   I have read or had this consent read to me. . I understand the contents of this consent, which adequately explains the benefits and risks of the Services being provided via telemedicine.  . I have been provided ample opportunity to ask questions regarding this consent and the Services and have had my questions answered to my satisfaction. . I give my informed consent for the services to be provided through the use of telemedicine in my medical care

## 2020-01-17 NOTE — Progress Notes (Signed)
Virtual Visit via Telephone Note   This visit type was conducted due to national recommendations for restrictions regarding the COVID-19 Pandemic (e.g. social distancing) in an effort to limit this patient's exposure and mitigate transmission in our community.  Due to her co-morbid illnesses, this patient is at least at moderate risk for complications without adequate follow up.  This format is felt to be most appropriate for this patient at this time.  The patient did not have access to video technology/had technical difficulties with video requiring transitioning to audio format only (telephone).  All issues noted in this document were discussed and addressed.  No physical exam could be performed with this format.  Please refer to the patient's chart for her  consent to telehealth for [facility].   The patient was identified using 2 identifiers.  Date:  01/18/2020   ID:  Kendra Hughes, DOB 1922-10-20, MRN [id number]  Patient Location: Home Provider Location: Office/Clinic  PCP:  Carmin Richmond, MD  Cardiologist:  Chrystie Nose, MD  Electrophysiologist:  None   Evaluation Performed:  Follow-Up Visit  Chief Complaint:  Follow up  History of Present Illness:    Kendra Hughes is a 86 y.o. female with PAF on coumadin, h/o DVT, HTN, HLD, and RA.  Myoview obtained on 10/18/2019 showed EF 71%, no ischemia.  More recently, patient was admitted in January 2021 with atrial fibrillation with RVR in the setting of UTI.  She was seen by EP service during hospitalization to help with rate control and was ultimately discharged on diltiazem 240 mg daily and metoprolol tartrate 25 mg twice daily.  Echocardiogram obtained on 07/17/2019 showed EF 50 to 55%, mild LAE, severe RAE, mild MR, mild aortic stenosis, moderate TR.  When she was seen for follow-up in February, she was complaining of increasing dyspnea on exertion.  She was volume overloaded on exam with 1-2+ pitting edema.  Her Lasix was temporarily  increased for 4 days before going back down to 40 mg daily dosing.  Her most recent follow-up with Dr. Rennis Golden was in May 2021, at which time, she had 8 pounds weight gain.  Dr. Rennis Golden advised her to increase her Lasix to 80 mg daily and remain on this dose.  To avoid hypotension, her diltiazem was decreased from 240 mg daily to 120 mg daily.  Patient was contacted today, systolic blood pressure is borderline low, however according to the patient of her blood pressure is typically around 100-110 range.  Based on the last blood work obtained 2 weeks after the previous office visit at her PCPs office, her renal function and electrolyte was quite stable on the higher dose of diuretic.  Her weight has not changed much, however she does not have a weight diary to compare the difference between her home weight and her office weight.  She denies any significant lower extremity edema, orthopnea or PND.  She has upcoming follow-up with her PCP August with plan to have repeat blood work.  I did advise the patient to contact cardiology if her systolic blood pressure is persistently low on multiple checks.  Her heart rate is very well controlled and Coumadin level is being followed by her PCPs office.  We will see her back in 2 months for follow-up.  For a 86 year old, she is actually quite independent.  The patient does not have symptoms concerning for COVID-19 infection (fever, chills, cough, or new shortness of breath).    Past Medical History:  Diagnosis  Date  . Arthritis    "right knee" (11/10/2015)  . Chronic diastolic (congestive) heart failure (HCC)   . DOE (dyspnea on exertion)   . DVT (deep venous thrombosis) (HCC) "years ago"  . Dyslipidemia   . Fracture of left hip (HCC) 11/09/2015   "fell"  . History of nuclear stress test 10/2009   normal exam  . Hypertension   . PAF (paroxysmal atrial fibrillation) (HCC)   . Rheumatoid arthritis (HCC) 1970   "it's in remission" (11/10/2015)   Past Surgical History:    Procedure Laterality Date  . ABDOMINAL HYSTERECTOMY  1996  . BLADDER SUSPENSION  X 2  . BREAST BIOPSY Right 1984   "benign"  . CARDIOVERSION N/A 11/01/2013   Procedure: CARDIOVERSION;  Surgeon: Chrystie Nose, MD;  Location: [facility] ENDOSCOPY;  Service: Cardiovascular;  Laterality: N/A;  . CATARACT EXTRACTION W/ INTRAOCULAR LENS  IMPLANT, BILATERAL Bilateral 2003  . RECTOCELE REPAIR    . TEE WITHOUT CARDIOVERSION N/A 11/01/2013   Procedure: TRANSESOPHAGEAL ECHOCARDIOGRAM (TEE);  Surgeon: Chrystie Nose, MD;  Location: [facility] ENDOSCOPY;  Service: Cardiovascular;  Laterality: N/A;  . TONSILLECTOMY    . TOTAL HIP ARTHROPLASTY Left 11/11/2015   Procedure:  HEMI ARTHROPLASTY ANTERIOR APPROACH LEFT;  Surgeon: Kathryne Hitch, MD;  Location: [facility] OR;  Service: Orthopedics;  Laterality: Left;  . TRANSTHORACIC ECHOCARDIOGRAM  07/22/2012   EF 55-60%; mild AV regurg; mild MR; mild-mod TR     Current Meds  Medication Sig  . acetaminophen (TYLENOL) 325 MG tablet Take 2 tablets (650 mg total) by mouth every 6 (six) hours as needed for mild pain (or Fever >/= 101).  [name] diltiazem (CARDIZEM CD) 120 MG 24 hr capsule Take 1 capsule (120 mg total) by mouth daily.  . furosemide (LASIX) 40 MG tablet Take 2 tablets (80 mg total) by mouth daily. (Patient taking differently: Take 40 mg by mouth daily. )  . metoprolol tartrate (LOPRESSOR) 25 MG tablet Take 25 mg by mouth 2 (two) times daily.   Bertram Gala Glycol-Propyl Glycol (SYSTANE OP) Place 1 drop into both eyes daily as needed (dry eyes).  . warfarin (COUMADIN) 1 MG tablet 1/2 of a 1 mg tablet added to a 2 mg tablet to equal 2.5 mg daily  . warfarin (COUMADIN) 2 MG tablet      Allergies:   Amiodarone   Social History   Tobacco Use  . Smoking status: Never Smoker  . Smokeless tobacco: Never Used  Substance Use Topics  . Alcohol use: No  . Drug use: No     Family Hx: The patient's family history includes Heart disease in her mother.  ROS:   Please  see the history of present illness.     All other systems reviewed and are negative.   Prior CV studies:   The following studies were reviewed today:   Echo 07/17/2019 1. Left ventricular ejection fraction, by visual estimation, is 50 to  55%. The left ventricle has normal function. There is no left ventricular  hypertrophy.  2. Left ventricular diastolic function could not be evaluated.  3. The left ventricle has no regional wall motion abnormalities.  4. Global right ventricle has normal systolic function.The right  ventricular size is mildly enlarged.  5. Left atrial size was mildly dilated.  6. Right atrial size was severely dilated.  7. The mitral valve is normal in structure. Mild mitral valve  regurgitation. No evidence of mitral stenosis.  8. The tricuspid valve is normal in structure.  9. The aortic valve is tricuspid. Aortic valve regurgitation is not  visualized. Mild aortic valve stenosis.  10. The pulmonic valve was normal in structure. Pulmonic valve  regurgitation is not visualized.  11. Mildly elevated pulmonary artery systolic pressure.  12. The inferior vena cava is normal in size with greater than 50%  respiratory variability, suggesting right atrial pressure of 3 mmHg.  13. Normal LV systolic function; biatrial enlargement; mild RVE; calcified  aortic valve with mild AS (mean gradient 11 mmHg; visually appears  moderate); mild MR; moderate TR.   Labs/Other Tests and Data Reviewed:    EKG:  An ECG dated 11/19/2019 was personally reviewed today and demonstrated:  Atrial fibrillation, overall low voltage.  Recent Labs: 07/17/2019: ALT 19; TSH 1.578 07/20/2019: Magnesium 2.2 07/25/2019: BUN 23; Creatinine, Ser 0.89; Hemoglobin 12.7; Platelets 283; Potassium 3.8; Sodium 137   Recent Lipid Panel No results found for: CHOL, TRIG, HDL, CHOLHDL, LDLCALC, LDLDIRECT  Wt Readings from Last 3 Encounters:  01/17/20 147 lb 8 oz (66.9 kg)  11/19/19 148 lb 12.8 oz  (67.5 kg)  09/22/19 146 lb (66.2 kg)     Objective:    Vital Signs:  BP 95/66   Pulse 83   Ht 5\' 5"  (1.651 m)   Wt 147 lb 8 oz (66.9 kg)   BMI 24.55 kg/m    VITAL SIGNS:  reviewed  ASSESSMENT & PLAN:    1. PAF: Rate controlled on current therapy.  Coumadin level managed by primary care provider.  2. Hypertension: Blood pressure borderline low today, however according to the patient, her blood pressure is typically in the 100-110s range.  I advised her to contact cardiology service if her systolic blood pressure is below 100 mmHg on multiple checks.  Despite borderline low blood pressure, she is completely asymptomatic without any dizziness, blurred vision or feeling of passing out.  3. Hyperlipidemia: She is not on a statin.  I did not try to add statin given her age.  COVID-19 Education: The signs and symptoms of COVID-19 were discussed with the patient and how to seek care for testing (follow up with PCP or arrange E-visit).  The importance of social distancing was discussed today.  Time:   Today, I have spent 7 minutes with the patient with telehealth technology discussing the above problems.     Medication Adjustments/Labs and Tests Ordered: Current medicines are reviewed at length with the patient today.  Concerns regarding medicines are outlined above.   Tests Ordered: No orders of the defined types were placed in this encounter.   Medication Changes: No orders of the defined types were placed in this encounter.   Follow Up:  Either In Person or Virtual in 2 month(s)  Signed, , Azalee Course  01/18/2020 11:35 PM    [facility]

## 2020-01-17 NOTE — Patient Instructions (Addendum)
Medication Instructions:  Your physician recommends that you continue on your current medications as directed. Please refer to the Current Medication list given to you today.  *If you need a refill on your cardiac medications before your next appointment, please call your pharmacy*  Lab Work: NONE ordered at this time of appointment   If you have labs (blood work) drawn today and your tests are completely normal, you will receive your results only by: [name] MyChart Message (if you have MyChart) OR . A paper copy in the mail If you have any lab test that is abnormal or we need to change your treatment, we will call you to review the results.  Testing/Procedures: NONE ordered at this time of appointment   Follow-Up: At [facility], you and your health needs are our priority.  As part of our continuing mission to provide you with exceptional heart care, we have created designated Provider Care Teams.  These Care Teams include your primary Cardiologist (physician) and Advanced Practice Providers (APPs -  Physician Assistants and Nurse Practitioners) who all work together to provide you with the care you need, when you need it.  We recommend signing up for the patient portal called "MyChart".  Sign up information is provided on this After Visit Summary.  MyChart is used to connect with patients for Virtual Visits (Telemedicine).  Patients are able to view lab/test results, encounter notes, upcoming appointments, etc.  Non-urgent messages can be sent to your provider as well.   To learn more about what you can do with MyChart, go to [email or web address].    Your next appointment:   2 month(s)  The format for your next appointment:   In Person  Provider:   Kirtland Bouchard [place] Hilty, MD or Azalee Course, PA-C  Other Instructions  Call our office if you experience weight gain of 3 lbs overnight and 5 lbs in a week.   Call our office if your Systolic (Top) number of blood pressure is persistently less than  100 on multiple checks

## 2020-01-18 ENCOUNTER — Encounter: Payer: Self-pay | Admitting: Physician Assistant

## 2020-03-21 ENCOUNTER — Ambulatory Visit: Payer: Medicare Other | Admitting: Internal Medicine

## 2020-04-19 ENCOUNTER — Other Ambulatory Visit: Payer: Self-pay | Admitting: Internal Medicine

## 2020-06-26 ENCOUNTER — Other Ambulatory Visit: Payer: Self-pay | Admitting: Internal Medicine

## 2020-06-30 ENCOUNTER — Other Ambulatory Visit: Payer: Self-pay | Admitting: Internal Medicine

## 2020-08-06 IMAGING — CR DG CHEST 2V
2 series · 2 of 2 positions shown · non-contrast
Comparison: Chest x-ray 07/16/2019.

CLINICAL DATA: [AGE] female with history of cough.

EXAM:
CHEST - 2 VIEW

[chest lat]
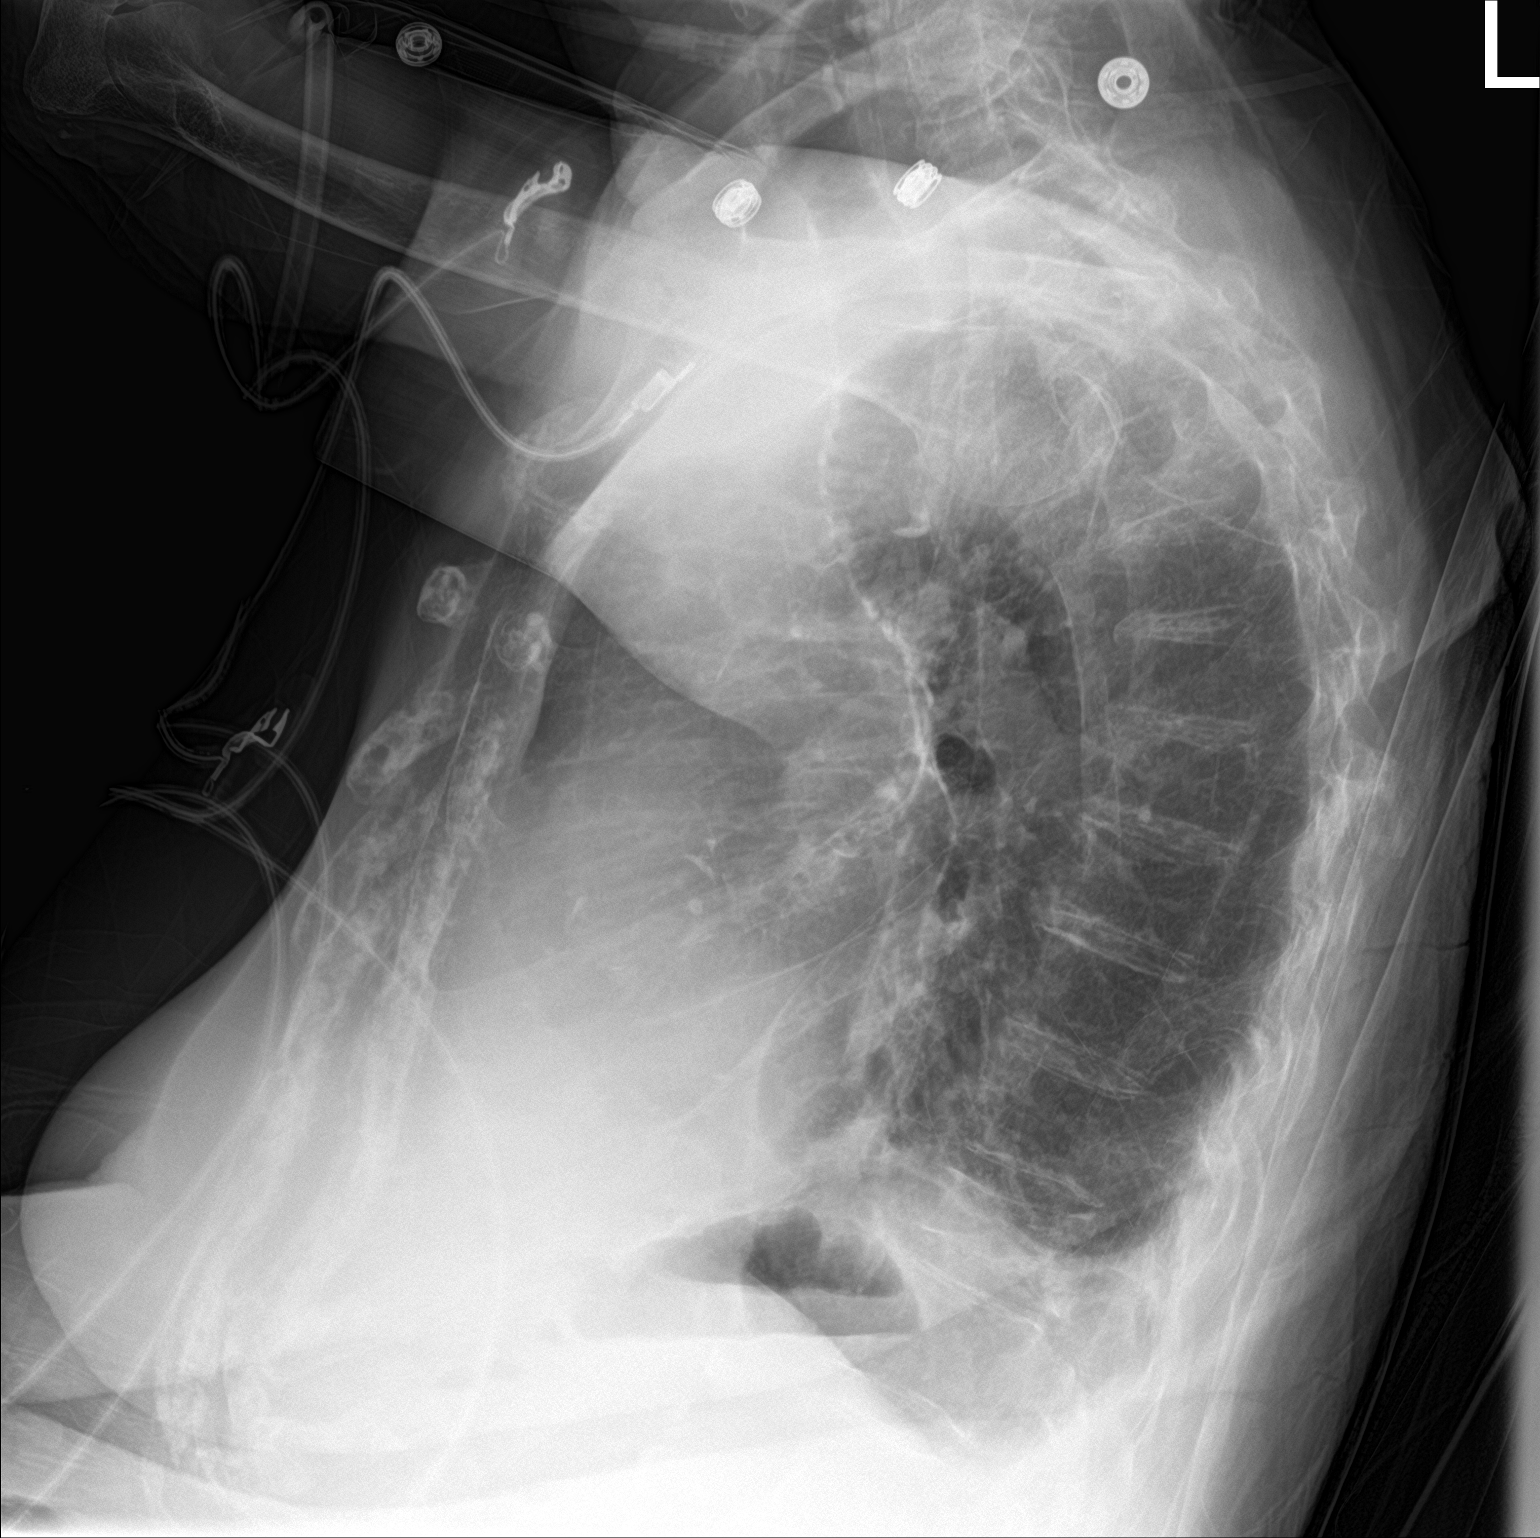

[chest ap]
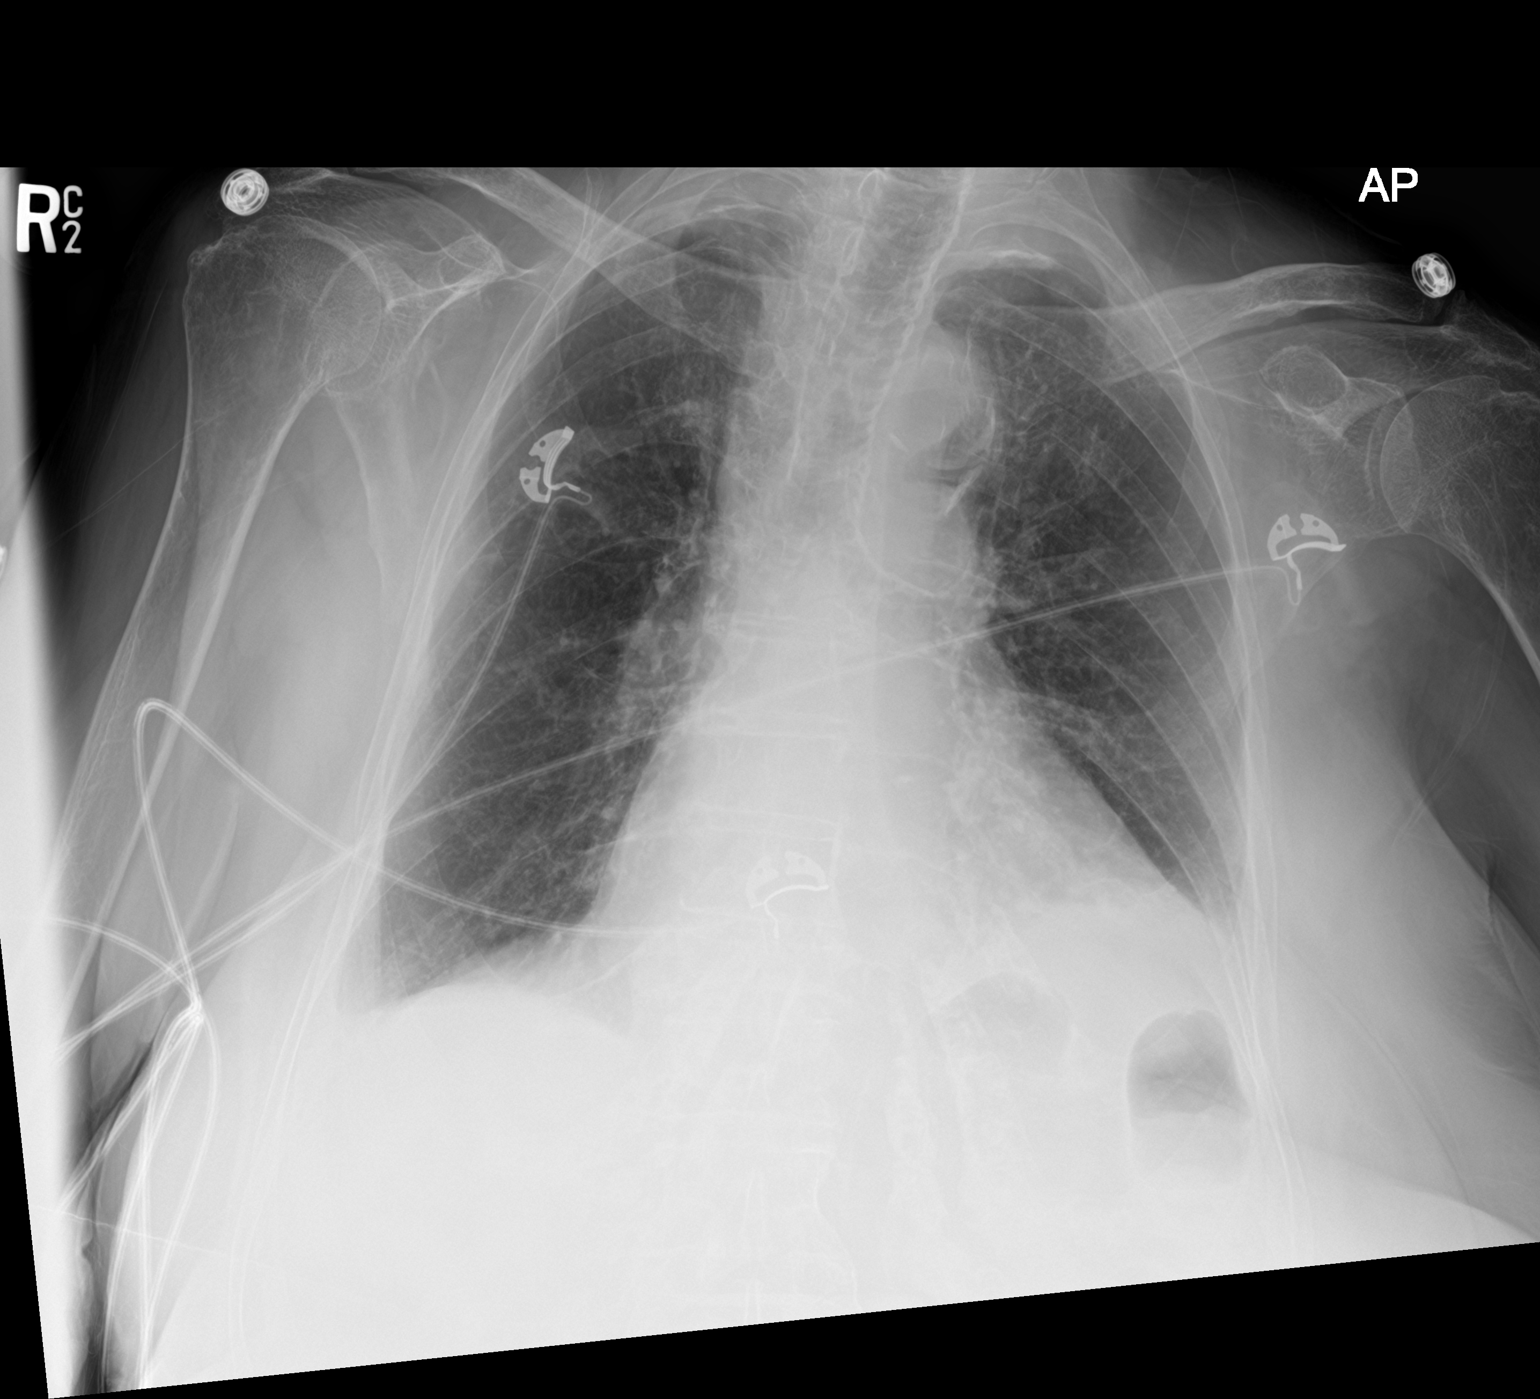

[2 of 2 positions shown; findings below may reference images not displayed]

FINDINGS: Small bilateral pleural effusions with bibasilar opacities (left
greater than right), which may represent areas of atelectasis and/or
consolidation. No pneumothorax. No suspicious appearing pulmonary
nodules or masses are noted. No evidence of pulmonary edema. Mild
cardiomegaly. The patient is rotated to the left on today's exam,
resulting in distortion of the mediastinal contours and reduced
diagnostic sensitivity and specificity for mediastinal pathology.
Aortic atherosclerosis.
IMPRESSION: 1. Small bilateral pleural effusions with areas of atelectasis
and/or consolidation in the lung bases bilaterally (left greater
than right).
2. Cardiomegaly.
3. Aortic atherosclerosis.

## 2020-10-09 ENCOUNTER — Other Ambulatory Visit: Payer: Self-pay | Admitting: Internal Medicine

## 2020-11-25 ENCOUNTER — Other Ambulatory Visit: Payer: Self-pay | Admitting: Internal Medicine

## 2020-11-27 NOTE — Telephone Encounter (Signed)
Rx(s) sent to pharmacy electronically.  

## 2021-04-18 ENCOUNTER — Other Ambulatory Visit: Payer: Self-pay | Admitting: Internal Medicine

## 2021-07-26 ENCOUNTER — Other Ambulatory Visit: Payer: Self-pay | Admitting: Internal Medicine

## 2021-09-24 ENCOUNTER — Other Ambulatory Visit: Payer: Self-pay | Admitting: Internal Medicine

## 2022-01-23 ENCOUNTER — Other Ambulatory Visit: Payer: Self-pay | Admitting: Internal Medicine

## 2023-03-09 DEATH — deceased
